# Patient Record
Sex: Male | Born: 1970 | Race: White | Hispanic: No | Smoking: Former smoker
Health system: Southern US, Community
[De-identification: ages and names within clinical notes are randomized; demographics above are authoritative.]

## PROBLEM LIST (undated history)

## (undated) DIAGNOSIS — F17200 Nicotine dependence, unspecified, uncomplicated: Secondary | ICD-10-CM

## (undated) DIAGNOSIS — Z79899 Other long term (current) drug therapy: Secondary | ICD-10-CM

## (undated) DIAGNOSIS — Z9842 Cataract extraction status, left eye: Secondary | ICD-10-CM

## (undated) DIAGNOSIS — G8929 Other chronic pain: Secondary | ICD-10-CM

## (undated) DIAGNOSIS — N189 Chronic kidney disease, unspecified: Secondary | ICD-10-CM

## (undated) DIAGNOSIS — M109 Gout, unspecified: Secondary | ICD-10-CM

## (undated) DIAGNOSIS — M1A9XX1 Chronic gout, unspecified, with tophus (tophi): Secondary | ICD-10-CM

## (undated) DIAGNOSIS — M1711 Unilateral primary osteoarthritis, right knee: Secondary | ICD-10-CM

## (undated) DIAGNOSIS — M779 Enthesopathy, unspecified: Secondary | ICD-10-CM

## (undated) DIAGNOSIS — R7301 Impaired fasting glucose: Secondary | ICD-10-CM

## (undated) DIAGNOSIS — D649 Anemia, unspecified: Secondary | ICD-10-CM

## (undated) DIAGNOSIS — E785 Hyperlipidemia, unspecified: Secondary | ICD-10-CM

## (undated) DIAGNOSIS — Q615 Medullary cystic kidney: Secondary | ICD-10-CM

## (undated) DIAGNOSIS — I10 Essential (primary) hypertension: Secondary | ICD-10-CM

## (undated) DIAGNOSIS — M1712 Unilateral primary osteoarthritis, left knee: Secondary | ICD-10-CM

## (undated) HISTORY — PX: EYE SURGERY: SHX253

## (undated) HISTORY — PX: CARDIAC CATHETERIZATION: SHX172

## (undated) HISTORY — DX: Unilateral primary osteoarthritis, left knee: M17.12

## (undated) HISTORY — DX: Gout, unspecified: M10.9

## (undated) HISTORY — DX: Other long term (current) drug therapy: Z79.899

## (undated) HISTORY — DX: Other chronic pain: G89.29

## (undated) HISTORY — DX: Enthesopathy, unspecified: M77.9

## (undated) HISTORY — DX: Impaired fasting glucose: R73.01

## (undated) HISTORY — DX: Essential (primary) hypertension: I10

## (undated) HISTORY — PX: HERNIA REPAIR: SHX51

## (undated) HISTORY — DX: Hyperlipidemia, unspecified: E78.5

## (undated) HISTORY — DX: Medullary cystic kidney: Q61.5

## (undated) HISTORY — PX: CARPAL TUNNEL RELEASE: SHX101

## (undated) HISTORY — DX: Cataract extraction status, left eye: Z98.42

## (undated) HISTORY — DX: Anemia, unspecified: D64.9

---

## 2004-05-17 ENCOUNTER — Other Ambulatory Visit: Payer: Self-pay

## 2005-01-12 ENCOUNTER — Ambulatory Visit: Payer: Self-pay

## 2006-11-13 HISTORY — PX: CATARACT EXTRACTION W/ INTRAOCULAR LENS IMPLANT: SHX1309

## 2007-02-06 ENCOUNTER — Ambulatory Visit: Payer: Self-pay | Admitting: Ophthalmology

## 2007-04-08 ENCOUNTER — Ambulatory Visit: Payer: Self-pay | Admitting: Ophthalmology

## 2008-03-04 ENCOUNTER — Ambulatory Visit: Payer: Self-pay | Admitting: Surgery

## 2008-03-04 ENCOUNTER — Other Ambulatory Visit: Payer: Self-pay

## 2008-03-05 ENCOUNTER — Ambulatory Visit: Payer: Self-pay | Admitting: Surgery

## 2008-11-13 HISTORY — PX: UMBILICAL HERNIA REPAIR: SHX196

## 2009-01-21 ENCOUNTER — Ambulatory Visit: Payer: Self-pay | Admitting: Pain Medicine

## 2012-01-20 DIAGNOSIS — E785 Hyperlipidemia, unspecified: Secondary | ICD-10-CM | POA: Diagnosis not present

## 2012-01-20 DIAGNOSIS — Q615 Medullary cystic kidney: Secondary | ICD-10-CM | POA: Diagnosis not present

## 2012-01-20 DIAGNOSIS — I10 Essential (primary) hypertension: Secondary | ICD-10-CM | POA: Diagnosis not present

## 2012-01-20 DIAGNOSIS — Z79899 Other long term (current) drug therapy: Secondary | ICD-10-CM | POA: Diagnosis not present

## 2012-01-20 DIAGNOSIS — G894 Chronic pain syndrome: Secondary | ICD-10-CM | POA: Diagnosis not present

## 2012-01-20 DIAGNOSIS — M1A9XX1 Chronic gout, unspecified, with tophus (tophi): Secondary | ICD-10-CM | POA: Diagnosis not present

## 2012-03-29 DIAGNOSIS — E781 Pure hyperglyceridemia: Secondary | ICD-10-CM | POA: Diagnosis not present

## 2012-03-29 DIAGNOSIS — G894 Chronic pain syndrome: Secondary | ICD-10-CM | POA: Diagnosis not present

## 2012-03-29 DIAGNOSIS — D631 Anemia in chronic kidney disease: Secondary | ICD-10-CM | POA: Diagnosis not present

## 2012-03-29 DIAGNOSIS — M1A9XX1 Chronic gout, unspecified, with tophus (tophi): Secondary | ICD-10-CM | POA: Diagnosis not present

## 2012-03-29 DIAGNOSIS — Q615 Medullary cystic kidney: Secondary | ICD-10-CM | POA: Diagnosis not present

## 2012-03-29 DIAGNOSIS — N039 Chronic nephritic syndrome with unspecified morphologic changes: Secondary | ICD-10-CM | POA: Diagnosis not present

## 2012-03-29 DIAGNOSIS — Z79899 Other long term (current) drug therapy: Secondary | ICD-10-CM | POA: Diagnosis not present

## 2012-03-29 DIAGNOSIS — E785 Hyperlipidemia, unspecified: Secondary | ICD-10-CM | POA: Diagnosis not present

## 2012-03-29 DIAGNOSIS — I10 Essential (primary) hypertension: Secondary | ICD-10-CM | POA: Diagnosis not present

## 2012-04-01 DIAGNOSIS — E781 Pure hyperglyceridemia: Secondary | ICD-10-CM | POA: Diagnosis not present

## 2012-04-01 DIAGNOSIS — M1A9XX1 Chronic gout, unspecified, with tophus (tophi): Secondary | ICD-10-CM | POA: Diagnosis not present

## 2012-04-01 DIAGNOSIS — D631 Anemia in chronic kidney disease: Secondary | ICD-10-CM | POA: Diagnosis not present

## 2012-04-01 DIAGNOSIS — Z79899 Other long term (current) drug therapy: Secondary | ICD-10-CM | POA: Diagnosis not present

## 2012-05-27 DIAGNOSIS — Z79899 Other long term (current) drug therapy: Secondary | ICD-10-CM | POA: Diagnosis not present

## 2012-05-27 DIAGNOSIS — D631 Anemia in chronic kidney disease: Secondary | ICD-10-CM | POA: Diagnosis not present

## 2012-05-27 DIAGNOSIS — E781 Pure hyperglyceridemia: Secondary | ICD-10-CM | POA: Diagnosis not present

## 2012-05-27 DIAGNOSIS — E785 Hyperlipidemia, unspecified: Secondary | ICD-10-CM | POA: Diagnosis not present

## 2012-05-27 DIAGNOSIS — G894 Chronic pain syndrome: Secondary | ICD-10-CM | POA: Diagnosis not present

## 2012-05-27 DIAGNOSIS — N039 Chronic nephritic syndrome with unspecified morphologic changes: Secondary | ICD-10-CM | POA: Diagnosis not present

## 2012-05-27 DIAGNOSIS — Q615 Medullary cystic kidney: Secondary | ICD-10-CM | POA: Diagnosis not present

## 2012-05-27 DIAGNOSIS — I10 Essential (primary) hypertension: Secondary | ICD-10-CM | POA: Diagnosis not present

## 2012-05-27 DIAGNOSIS — M1A9XX1 Chronic gout, unspecified, with tophus (tophi): Secondary | ICD-10-CM | POA: Diagnosis not present

## 2012-06-19 DIAGNOSIS — M1A9XX1 Chronic gout, unspecified, with tophus (tophi): Secondary | ICD-10-CM | POA: Diagnosis not present

## 2012-06-19 DIAGNOSIS — Z79899 Other long term (current) drug therapy: Secondary | ICD-10-CM | POA: Diagnosis not present

## 2012-06-19 DIAGNOSIS — I10 Essential (primary) hypertension: Secondary | ICD-10-CM | POA: Diagnosis not present

## 2012-06-19 DIAGNOSIS — E785 Hyperlipidemia, unspecified: Secondary | ICD-10-CM | POA: Diagnosis not present

## 2012-06-19 DIAGNOSIS — E781 Pure hyperglyceridemia: Secondary | ICD-10-CM | POA: Diagnosis not present

## 2012-07-19 DIAGNOSIS — I10 Essential (primary) hypertension: Secondary | ICD-10-CM | POA: Diagnosis not present

## 2012-07-19 DIAGNOSIS — N183 Chronic kidney disease, stage 3 unspecified: Secondary | ICD-10-CM | POA: Diagnosis not present

## 2012-07-19 DIAGNOSIS — Q615 Medullary cystic kidney: Secondary | ICD-10-CM | POA: Diagnosis not present

## 2012-08-02 DIAGNOSIS — H2510 Age-related nuclear cataract, unspecified eye: Secondary | ICD-10-CM | POA: Diagnosis not present

## 2012-08-02 DIAGNOSIS — Z961 Presence of intraocular lens: Secondary | ICD-10-CM | POA: Diagnosis not present

## 2012-08-30 DIAGNOSIS — Z79899 Other long term (current) drug therapy: Secondary | ICD-10-CM | POA: Diagnosis not present

## 2012-08-30 DIAGNOSIS — I10 Essential (primary) hypertension: Secondary | ICD-10-CM | POA: Diagnosis not present

## 2012-08-30 DIAGNOSIS — E785 Hyperlipidemia, unspecified: Secondary | ICD-10-CM | POA: Diagnosis not present

## 2012-08-30 DIAGNOSIS — D631 Anemia in chronic kidney disease: Secondary | ICD-10-CM | POA: Diagnosis not present

## 2012-08-30 DIAGNOSIS — Q615 Medullary cystic kidney: Secondary | ICD-10-CM | POA: Diagnosis not present

## 2012-08-30 DIAGNOSIS — E781 Pure hyperglyceridemia: Secondary | ICD-10-CM | POA: Diagnosis not present

## 2012-08-30 DIAGNOSIS — N039 Chronic nephritic syndrome with unspecified morphologic changes: Secondary | ICD-10-CM | POA: Diagnosis not present

## 2012-08-30 DIAGNOSIS — G894 Chronic pain syndrome: Secondary | ICD-10-CM | POA: Diagnosis not present

## 2012-08-30 DIAGNOSIS — M1A9XX1 Chronic gout, unspecified, with tophus (tophi): Secondary | ICD-10-CM | POA: Diagnosis not present

## 2012-10-25 DIAGNOSIS — E785 Hyperlipidemia, unspecified: Secondary | ICD-10-CM | POA: Diagnosis not present

## 2012-10-25 DIAGNOSIS — M1A9XX1 Chronic gout, unspecified, with tophus (tophi): Secondary | ICD-10-CM | POA: Diagnosis not present

## 2012-10-25 DIAGNOSIS — N039 Chronic nephritic syndrome with unspecified morphologic changes: Secondary | ICD-10-CM | POA: Diagnosis not present

## 2012-10-25 DIAGNOSIS — R7989 Other specified abnormal findings of blood chemistry: Secondary | ICD-10-CM | POA: Diagnosis not present

## 2012-10-25 DIAGNOSIS — Z79899 Other long term (current) drug therapy: Secondary | ICD-10-CM | POA: Diagnosis not present

## 2012-10-25 DIAGNOSIS — D631 Anemia in chronic kidney disease: Secondary | ICD-10-CM | POA: Diagnosis not present

## 2012-10-25 DIAGNOSIS — E781 Pure hyperglyceridemia: Secondary | ICD-10-CM | POA: Diagnosis not present

## 2012-11-01 DIAGNOSIS — G894 Chronic pain syndrome: Secondary | ICD-10-CM | POA: Diagnosis not present

## 2012-11-01 DIAGNOSIS — D631 Anemia in chronic kidney disease: Secondary | ICD-10-CM | POA: Diagnosis not present

## 2012-11-01 DIAGNOSIS — Q615 Medullary cystic kidney: Secondary | ICD-10-CM | POA: Diagnosis not present

## 2012-11-01 DIAGNOSIS — I10 Essential (primary) hypertension: Secondary | ICD-10-CM | POA: Diagnosis not present

## 2012-11-01 DIAGNOSIS — E785 Hyperlipidemia, unspecified: Secondary | ICD-10-CM | POA: Diagnosis not present

## 2012-11-01 DIAGNOSIS — E781 Pure hyperglyceridemia: Secondary | ICD-10-CM | POA: Diagnosis not present

## 2012-11-01 DIAGNOSIS — Z79899 Other long term (current) drug therapy: Secondary | ICD-10-CM | POA: Diagnosis not present

## 2012-11-01 DIAGNOSIS — N039 Chronic nephritic syndrome with unspecified morphologic changes: Secondary | ICD-10-CM | POA: Diagnosis not present

## 2012-11-01 DIAGNOSIS — M1A9XX1 Chronic gout, unspecified, with tophus (tophi): Secondary | ICD-10-CM | POA: Diagnosis not present

## 2012-12-13 DIAGNOSIS — M1710 Unilateral primary osteoarthritis, unspecified knee: Secondary | ICD-10-CM | POA: Diagnosis not present

## 2013-01-03 DIAGNOSIS — M1A9XX1 Chronic gout, unspecified, with tophus (tophi): Secondary | ICD-10-CM | POA: Diagnosis not present

## 2013-01-03 DIAGNOSIS — Z79899 Other long term (current) drug therapy: Secondary | ICD-10-CM | POA: Diagnosis not present

## 2013-01-03 DIAGNOSIS — I10 Essential (primary) hypertension: Secondary | ICD-10-CM | POA: Diagnosis not present

## 2013-01-03 DIAGNOSIS — Q615 Medullary cystic kidney: Secondary | ICD-10-CM | POA: Diagnosis not present

## 2013-01-03 DIAGNOSIS — G894 Chronic pain syndrome: Secondary | ICD-10-CM | POA: Diagnosis not present

## 2013-01-03 DIAGNOSIS — E781 Pure hyperglyceridemia: Secondary | ICD-10-CM | POA: Diagnosis not present

## 2013-01-03 DIAGNOSIS — N039 Chronic nephritic syndrome with unspecified morphologic changes: Secondary | ICD-10-CM | POA: Diagnosis not present

## 2013-01-03 DIAGNOSIS — D631 Anemia in chronic kidney disease: Secondary | ICD-10-CM | POA: Diagnosis not present

## 2013-01-03 DIAGNOSIS — E785 Hyperlipidemia, unspecified: Secondary | ICD-10-CM | POA: Diagnosis not present

## 2013-02-07 DIAGNOSIS — I10 Essential (primary) hypertension: Secondary | ICD-10-CM | POA: Diagnosis not present

## 2013-02-07 DIAGNOSIS — R7989 Other specified abnormal findings of blood chemistry: Secondary | ICD-10-CM | POA: Diagnosis not present

## 2013-02-07 DIAGNOSIS — E781 Pure hyperglyceridemia: Secondary | ICD-10-CM | POA: Diagnosis not present

## 2013-02-07 DIAGNOSIS — E785 Hyperlipidemia, unspecified: Secondary | ICD-10-CM | POA: Diagnosis not present

## 2013-02-07 DIAGNOSIS — G894 Chronic pain syndrome: Secondary | ICD-10-CM | POA: Diagnosis not present

## 2013-02-07 DIAGNOSIS — M1A9XX1 Chronic gout, unspecified, with tophus (tophi): Secondary | ICD-10-CM | POA: Diagnosis not present

## 2013-02-07 DIAGNOSIS — Z79899 Other long term (current) drug therapy: Secondary | ICD-10-CM | POA: Diagnosis not present

## 2013-02-14 DIAGNOSIS — I10 Essential (primary) hypertension: Secondary | ICD-10-CM | POA: Diagnosis not present

## 2013-02-14 DIAGNOSIS — N183 Chronic kidney disease, stage 3 unspecified: Secondary | ICD-10-CM | POA: Diagnosis not present

## 2013-02-14 DIAGNOSIS — M109 Gout, unspecified: Secondary | ICD-10-CM | POA: Diagnosis not present

## 2013-02-14 DIAGNOSIS — Q615 Medullary cystic kidney: Secondary | ICD-10-CM | POA: Diagnosis not present

## 2013-02-27 DIAGNOSIS — I10 Essential (primary) hypertension: Secondary | ICD-10-CM | POA: Diagnosis not present

## 2013-02-27 DIAGNOSIS — E785 Hyperlipidemia, unspecified: Secondary | ICD-10-CM | POA: Diagnosis not present

## 2013-02-27 DIAGNOSIS — E781 Pure hyperglyceridemia: Secondary | ICD-10-CM | POA: Diagnosis not present

## 2013-02-27 DIAGNOSIS — G894 Chronic pain syndrome: Secondary | ICD-10-CM | POA: Diagnosis not present

## 2013-02-27 DIAGNOSIS — M1A9XX1 Chronic gout, unspecified, with tophus (tophi): Secondary | ICD-10-CM | POA: Diagnosis not present

## 2013-02-27 DIAGNOSIS — D631 Anemia in chronic kidney disease: Secondary | ICD-10-CM | POA: Diagnosis not present

## 2013-02-27 DIAGNOSIS — Q615 Medullary cystic kidney: Secondary | ICD-10-CM | POA: Diagnosis not present

## 2013-02-27 DIAGNOSIS — Z79899 Other long term (current) drug therapy: Secondary | ICD-10-CM | POA: Diagnosis not present

## 2013-04-15 DIAGNOSIS — Z79899 Other long term (current) drug therapy: Secondary | ICD-10-CM | POA: Diagnosis not present

## 2013-04-15 DIAGNOSIS — I10 Essential (primary) hypertension: Secondary | ICD-10-CM | POA: Diagnosis not present

## 2013-04-15 DIAGNOSIS — M1A9XX1 Chronic gout, unspecified, with tophus (tophi): Secondary | ICD-10-CM | POA: Diagnosis not present

## 2013-07-18 DIAGNOSIS — Q615 Medullary cystic kidney: Secondary | ICD-10-CM | POA: Diagnosis not present

## 2013-07-18 DIAGNOSIS — G894 Chronic pain syndrome: Secondary | ICD-10-CM | POA: Diagnosis not present

## 2013-07-18 DIAGNOSIS — I10 Essential (primary) hypertension: Secondary | ICD-10-CM | POA: Diagnosis not present

## 2013-07-18 DIAGNOSIS — M25569 Pain in unspecified knee: Secondary | ICD-10-CM | POA: Diagnosis not present

## 2013-07-18 DIAGNOSIS — M1A00X1 Idiopathic chronic gout, unspecified site, with tophus (tophi): Secondary | ICD-10-CM | POA: Diagnosis not present

## 2013-07-18 DIAGNOSIS — Z131 Encounter for screening for diabetes mellitus: Secondary | ICD-10-CM | POA: Diagnosis not present

## 2013-07-24 DIAGNOSIS — G894 Chronic pain syndrome: Secondary | ICD-10-CM | POA: Diagnosis not present

## 2013-07-24 DIAGNOSIS — Z131 Encounter for screening for diabetes mellitus: Secondary | ICD-10-CM | POA: Diagnosis not present

## 2013-07-24 DIAGNOSIS — I10 Essential (primary) hypertension: Secondary | ICD-10-CM | POA: Diagnosis not present

## 2013-07-24 DIAGNOSIS — R7989 Other specified abnormal findings of blood chemistry: Secondary | ICD-10-CM | POA: Diagnosis not present

## 2013-08-15 DIAGNOSIS — I10 Essential (primary) hypertension: Secondary | ICD-10-CM | POA: Diagnosis not present

## 2013-08-15 DIAGNOSIS — N183 Chronic kidney disease, stage 3 unspecified: Secondary | ICD-10-CM | POA: Diagnosis not present

## 2013-08-15 DIAGNOSIS — Q615 Medullary cystic kidney: Secondary | ICD-10-CM | POA: Diagnosis not present

## 2013-08-15 DIAGNOSIS — M109 Gout, unspecified: Secondary | ICD-10-CM | POA: Diagnosis not present

## 2013-08-25 DIAGNOSIS — N183 Chronic kidney disease, stage 3 unspecified: Secondary | ICD-10-CM | POA: Diagnosis not present

## 2013-08-25 DIAGNOSIS — I10 Essential (primary) hypertension: Secondary | ICD-10-CM | POA: Diagnosis not present

## 2013-08-25 DIAGNOSIS — Q615 Medullary cystic kidney: Secondary | ICD-10-CM | POA: Diagnosis not present

## 2013-08-25 DIAGNOSIS — M109 Gout, unspecified: Secondary | ICD-10-CM | POA: Diagnosis not present

## 2013-08-28 DIAGNOSIS — M1710 Unilateral primary osteoarthritis, unspecified knee: Secondary | ICD-10-CM | POA: Diagnosis not present

## 2013-09-01 DIAGNOSIS — N183 Chronic kidney disease, stage 3 unspecified: Secondary | ICD-10-CM | POA: Diagnosis not present

## 2013-09-01 DIAGNOSIS — M25569 Pain in unspecified knee: Secondary | ICD-10-CM | POA: Diagnosis not present

## 2013-09-01 DIAGNOSIS — G894 Chronic pain syndrome: Secondary | ICD-10-CM | POA: Diagnosis not present

## 2013-09-01 DIAGNOSIS — Z79899 Other long term (current) drug therapy: Secondary | ICD-10-CM | POA: Diagnosis not present

## 2013-09-04 DIAGNOSIS — M1710 Unilateral primary osteoarthritis, unspecified knee: Secondary | ICD-10-CM | POA: Diagnosis not present

## 2013-09-11 DIAGNOSIS — M1710 Unilateral primary osteoarthritis, unspecified knee: Secondary | ICD-10-CM | POA: Diagnosis not present

## 2013-09-18 DIAGNOSIS — M1710 Unilateral primary osteoarthritis, unspecified knee: Secondary | ICD-10-CM | POA: Diagnosis not present

## 2013-09-25 DIAGNOSIS — M1710 Unilateral primary osteoarthritis, unspecified knee: Secondary | ICD-10-CM | POA: Diagnosis not present

## 2013-10-02 DIAGNOSIS — M109 Gout, unspecified: Secondary | ICD-10-CM | POA: Diagnosis not present

## 2013-10-02 DIAGNOSIS — I10 Essential (primary) hypertension: Secondary | ICD-10-CM | POA: Diagnosis not present

## 2013-10-02 DIAGNOSIS — R7301 Impaired fasting glucose: Secondary | ICD-10-CM | POA: Diagnosis not present

## 2013-10-30 DIAGNOSIS — G894 Chronic pain syndrome: Secondary | ICD-10-CM | POA: Diagnosis not present

## 2013-10-30 DIAGNOSIS — F17200 Nicotine dependence, unspecified, uncomplicated: Secondary | ICD-10-CM | POA: Diagnosis not present

## 2013-10-30 DIAGNOSIS — M25569 Pain in unspecified knee: Secondary | ICD-10-CM | POA: Diagnosis not present

## 2013-10-30 DIAGNOSIS — M109 Gout, unspecified: Secondary | ICD-10-CM | POA: Diagnosis not present

## 2013-12-26 DIAGNOSIS — F17200 Nicotine dependence, unspecified, uncomplicated: Secondary | ICD-10-CM | POA: Diagnosis not present

## 2013-12-26 DIAGNOSIS — M109 Gout, unspecified: Secondary | ICD-10-CM | POA: Diagnosis not present

## 2013-12-26 DIAGNOSIS — G894 Chronic pain syndrome: Secondary | ICD-10-CM | POA: Diagnosis not present

## 2013-12-26 DIAGNOSIS — M25569 Pain in unspecified knee: Secondary | ICD-10-CM | POA: Diagnosis not present

## 2014-01-16 ENCOUNTER — Ambulatory Visit: Payer: Self-pay | Admitting: Orthopedic Surgery

## 2014-01-16 DIAGNOSIS — M1710 Unilateral primary osteoarthritis, unspecified knee: Secondary | ICD-10-CM | POA: Diagnosis not present

## 2014-01-16 DIAGNOSIS — M76899 Other specified enthesopathies of unspecified lower limb, excluding foot: Secondary | ICD-10-CM | POA: Diagnosis not present

## 2014-01-16 DIAGNOSIS — IMO0002 Reserved for concepts with insufficient information to code with codable children: Secondary | ICD-10-CM | POA: Diagnosis not present

## 2014-01-28 DIAGNOSIS — M1710 Unilateral primary osteoarthritis, unspecified knee: Secondary | ICD-10-CM | POA: Diagnosis not present

## 2014-02-20 DIAGNOSIS — E785 Hyperlipidemia, unspecified: Secondary | ICD-10-CM | POA: Diagnosis not present

## 2014-02-20 DIAGNOSIS — I129 Hypertensive chronic kidney disease with stage 1 through stage 4 chronic kidney disease, or unspecified chronic kidney disease: Secondary | ICD-10-CM | POA: Diagnosis not present

## 2014-02-20 DIAGNOSIS — R7301 Impaired fasting glucose: Secondary | ICD-10-CM | POA: Diagnosis not present

## 2014-03-11 DIAGNOSIS — I10 Essential (primary) hypertension: Secondary | ICD-10-CM | POA: Diagnosis not present

## 2014-03-11 DIAGNOSIS — N183 Chronic kidney disease, stage 3 unspecified: Secondary | ICD-10-CM | POA: Diagnosis not present

## 2014-03-11 DIAGNOSIS — M109 Gout, unspecified: Secondary | ICD-10-CM | POA: Diagnosis not present

## 2014-03-13 HISTORY — PX: JOINT REPLACEMENT: SHX530

## 2014-03-18 ENCOUNTER — Other Ambulatory Visit: Payer: Self-pay | Admitting: Physician Assistant

## 2014-03-18 ENCOUNTER — Encounter: Payer: Self-pay | Admitting: Physician Assistant

## 2014-03-18 DIAGNOSIS — M1712 Unilateral primary osteoarthritis, left knee: Secondary | ICD-10-CM | POA: Insufficient documentation

## 2014-03-18 DIAGNOSIS — G8929 Other chronic pain: Secondary | ICD-10-CM | POA: Insufficient documentation

## 2014-03-18 DIAGNOSIS — I10 Essential (primary) hypertension: Secondary | ICD-10-CM | POA: Insufficient documentation

## 2014-03-18 DIAGNOSIS — M1710 Unilateral primary osteoarthritis, unspecified knee: Secondary | ICD-10-CM | POA: Diagnosis not present

## 2014-03-18 DIAGNOSIS — Q615 Medullary cystic kidney: Secondary | ICD-10-CM

## 2014-03-18 NOTE — H&P (Signed)
TOTAL KNEE ADMISSION H&P  Patient is being admitted for left total knee arthroplasty.  Subjective:  Chief Complaint:left knee pain.  HPI: Nathan Ramos, 43 y.o. male, has a history of pain and functional disability in the left knee due to arthritis and has failed non-surgical conservative treatments for greater than 12 weeks to includeNSAID's and/or analgesics, corticosteriod injections, viscosupplementation injections, flexibility and strengthening excercises, supervised PT with diminished ADL's post treatment, weight reduction as appropriate and activity modification.  Onset of symptoms was gradual, starting 10 years ago with gradually worsening course since that time. The patient noted no past surgery on the left knee(s).  Patient currently rates pain in the left knee(s) at 10 out of 10 with activity. Patient has night pain, worsening of pain with activity and weight bearing, pain that interferes with activities of daily living, crepitus and joint swelling.  Patient has evidence of subchondral sclerosis, periarticular osteophytes and joint space narrowing by imaging studies. There is no active infection.  Patient Active Problem List   Diagnosis Date Noted  . Medullary cystic disease of the kidney   . Left knee DJD   . Chronic pain   . Hypertension    Past Medical History  Diagnosis Date  . 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 Left knee DJD   . Gout   . Chronic pain     Destry Ladona Ridgelaylor [facility]  . Hypertension     Past Surgical History  Procedure Laterality Date  . Carpal tunnel release Left   . Umbilical hernia repair  2010  . Cataract extraction w/ intraocular lens implant Left 2008  . Cataract extraction w/ intraocular lens implant Right 2008     (Not in a hospital admission) Allergies  Allergen Reactions  . Nsaids Other (See Comments)    impaired renal function    History  Substance Use Topics  . Smoking  status: Current Every Day Smoker -- 0.50 packs/day    Types: Cigarettes  . Smokeless tobacco: Not on file  . Alcohol Use: No    Family History  Problem Relation Age of Onset  . COPD Mother   . Stroke Father [id number]  . Kidney disease Mother      Review of Systems  Constitutional: Negative.   HENT: Negative.   Eyes: Negative.   Respiratory: Negative.   Cardiovascular: Negative.   Gastrointestinal: Negative.   Genitourinary: Negative.   Musculoskeletal: Positive for joint pain. Negative for back pain, falls, myalgias and neck pain.  Skin: Negative.   Endo/Heme/Allergies: Negative.   Psychiatric/Behavioral: Negative.     Objective:  Physical Exam  Constitutional: He is oriented to person, place, and time. He appears well-developed and well-nourished.  HENT:  Head: Normocephalic and atraumatic.  Mouth/Throat: Oropharynx is clear and moist.  Eyes: Conjunctivae are normal. Pupils are equal, round, and reactive to light.  Neck: Neck supple.  Cardiovascular: Normal rate and regular rhythm.   Respiratory: Effort normal and breath sounds normal.  GI: Soft. Bowel sounds are normal.  Genitourinary:  Not pertinent to current symptomatology therefore not examined.  Musculoskeletal:  . Examination of both knees reveals left knee is significantly more painful than right, 1+ synovitis 1+ crepitation, left knee shows range of motion -5 to 115 degrees with pain, range of motion has range of motion 0-125 degrees with pain. Vascular exam: pulses 2+ and symmetric.  Neurological: He is alert and oriented to person, place, and time.  Skin: Skin is warm and dry.  Psychiatric: He has a normal mood and affect. His behavior is normal.    Vital signs in last 24 hours: @VSRANGES @  Labs:   There is no height or weight on file to calculate BMI.   Imaging Review Plain radiographs demonstrate severe degenerative joint disease of the left knee(s). The overall alignment issignificant varus. The bone  quality appears to be good for age and reported activity level.  Assessment/Plan:  End stage arthritis, right knee   The patient history, physical examination, clinical judgment of the provider and imaging studies are consistent with end stage degenerative joint disease of the right knee(s) and total knee arthroplasty is deemed medically necessary. The treatment options including medical management, injection therapy arthroscopy and arthroplasty were discussed at length. The risks and benefits of total knee arthroplasty were presented and reviewed. The risks due to aseptic loosening, infection, stiffness, patella tracking problems, thromboembolic complications and other imponderables were discussed. The patient acknowledged the explanation, agreed to proceed with the plan and consent was signed. Patient is being admitted for inpatient treatment for surgery, pain control, PT, OT, prophylactic antibiotics, VTE prophylaxis, progressive ambulation and ADL's and discharge planning. The patient is planning to be discharged home with home health services Jamile Rekowski A. Gwinda PasseShepperson, PA-C Physician Assistant [facility] [phone]  03/18/2014, 3:36 PM

## 2014-03-20 DIAGNOSIS — M259 Joint disorder, unspecified: Secondary | ICD-10-CM | POA: Diagnosis not present

## 2014-03-20 DIAGNOSIS — M1710 Unilateral primary osteoarthritis, unspecified knee: Secondary | ICD-10-CM | POA: Diagnosis not present

## 2014-03-20 DIAGNOSIS — Z01818 Encounter for other preprocedural examination: Secondary | ICD-10-CM | POA: Diagnosis not present

## 2014-03-20 NOTE — Pre-Procedure Instructions (Signed)
Nathan GroomsStacey L Ramos  03/20/2014   Your procedure is scheduled on:  Monday May 18 th at 0920 AM  [facility] at 0720 AM.  Call this number if you have problems the morning of surgery: [phone]   Remember:   Do not eat food or drink liquids after midnight Sunday 03/29/14.   Take these medicines the morning of surgery with A SIP OF WATER: Amlodipine (Norvasc), and pain med if needed for pain   Do not wear jewelry.  Do not wear lotions, powders, or colognes. You may wear deodorant.  Men may shave face and neck.  Do not bring valuables to the hospital.  [facility] is not responsible for any belongings or valuables.               Contacts, dentures or bridgework may not be worn into surgery.  Leave suitcase in the car. After surgery it may be brought to your room.  For patients admitted to the hospital, discharge time is determined by your  treatment team.               Patients discharged the day of surgery will not be allowed to drive home.    Special Instructions: [place] - Preparing for Surgery  Before surgery, you can play an important role.  Because skin is not sterile, your skin needs to be as free of germs as possible.  You can reduce the number of germs on you skin by washing with CHG (chlorahexidine gluconate) soap before surgery.  CHG is an antiseptic cleaner which kills germs and bonds with the skin to continue killing germs even after washing.  Please DO NOT use if you have an allergy to CHG or antibacterial soaps.  If your skin becomes reddened/irritated stop using the CHG and inform your nurse when you arrive at Short Stay.  Do not shave (including legs and underarms) for at least 48 hours prior to the first CHG shower.  You may shave your face.  Please follow these instructions carefully:   1.  Shower with CHG Soap the night before surgery and the                                morning of Surgery.  2.  If you choose to wash your hair, wash your  hair first as usual with your       normal shampoo.  3.  After you shampoo, rinse your hair and body thoroughly to remove the                      Shampoo.  4.  Use CHG as you would any other liquid soap.  You can apply chg directly       to the skin and wash gently with scrungie or a clean washcloth.  5.  Apply the CHG Soap to your body ONLY FROM THE NECK DOWN.        Do not use on open wounds or open sores.  Avoid contact with your eyes,       ears, mouth and genitals (private parts).  Wash genitals (private parts)       with your normal soap.  6.  Wash thoroughly, paying special attention to the area where your surgery        will be performed.  7.  Thoroughly rinse your body with warm water from  the neck down.  8.  DO NOT shower/wash with your normal soap after using and rinsing off       the CHG Soap.  9.  Pat yourself dry with a clean towel.            10.  Wear clean pajamas.            11.  Place clean sheets on your bed the night of your first shower and do not        sleep with pets.  Day of Surgery  Do not apply any lotions/deoderants the morning of surgery.  Please wear clean clothes to the hospital/surgery center.      Please read over the following fact sheets that you were given: Pain Booklet, Coughing and Deep Breathing, Blood Transfusion Information, MRSA Information and Surgical Site Infection Prevention

## 2014-03-23 ENCOUNTER — Encounter (HOSPITAL_COMMUNITY)
Admission: RE | Admit: 2014-03-23 | Discharge: 2014-03-23 | Disposition: A | Discharge status: Home / Self Care | Payer: Medicare Other | Source: Ambulatory Visit | Attending: Orthopedic Surgery | Admitting: Orthopedic Surgery

## 2014-03-23 ENCOUNTER — Encounter (HOSPITAL_COMMUNITY): Payer: Self-pay

## 2014-03-23 ENCOUNTER — Other Ambulatory Visit: Payer: Self-pay | Admitting: Physician Assistant

## 2014-03-23 ENCOUNTER — Encounter (HOSPITAL_COMMUNITY)
Admission: RE | Admit: 2014-03-23 | Discharge: 2014-03-23 | Disposition: A | Discharge status: Home / Self Care | Payer: Medicare Other | Source: Ambulatory Visit | Attending: Physician Assistant | Admitting: Physician Assistant

## 2014-03-23 DIAGNOSIS — I10 Essential (primary) hypertension: Secondary | ICD-10-CM | POA: Insufficient documentation

## 2014-03-23 DIAGNOSIS — Z01812 Encounter for preprocedural laboratory examination: Secondary | ICD-10-CM | POA: Insufficient documentation

## 2014-03-23 HISTORY — DX: Chronic kidney disease, unspecified: N18.9

## 2014-03-23 HISTORY — DX: Nicotine dependence, unspecified, uncomplicated: F17.200

## 2014-03-23 LAB — URINALYSIS, ROUTINE W REFLEX MICROSCOPIC
Bilirubin Urine: NEGATIVE
Glucose, UA: NEGATIVE mg/dL
Ketones, ur: NEGATIVE mg/dL
Leukocytes, UA: NEGATIVE
Nitrite: NEGATIVE
Protein, ur: NEGATIVE mg/dL
Specific Gravity, Urine: 1.012 (ref 1.005–1.030)
Urobilinogen, UA: 0.2 mg/dL (ref 0.0–1.0)
pH: 5.5 (ref 5.0–8.0)

## 2014-03-23 LAB — URINE MICROSCOPIC-ADD ON

## 2014-03-23 LAB — APTT: aPTT: 35 seconds (ref 24–37)

## 2014-03-23 LAB — COMPREHENSIVE METABOLIC PANEL
ALT: 24 U/L (ref 0–53)
AST: 29 U/L (ref 0–37)
Albumin: 4.4 g/dL (ref 3.5–5.2)
Alkaline Phosphatase: 87 U/L (ref 39–117)
BUN: 41 mg/dL — ABNORMAL HIGH (ref 6–23)
CO2: 24 mEq/L (ref 19–32)
Calcium: 9.9 mg/dL (ref 8.4–10.5)
Chloride: 102 mEq/L (ref 96–112)
Creatinine, Ser: 2.05 mg/dL — ABNORMAL HIGH (ref 0.50–1.35)
GFR calc Af Amer: 45 mL/min — ABNORMAL LOW (ref >90)
GFR calc non Af Amer: 39 mL/min — ABNORMAL LOW (ref >90)
Glucose, Bld: 90 mg/dL (ref 70–99)
Potassium: 4.6 mEq/L (ref 3.7–5.3)
Sodium: 140 mEq/L (ref 137–147)
Total Bilirubin: 0.4 mg/dL (ref 0.3–1.2)
Total Protein: 8.4 g/dL — ABNORMAL HIGH (ref 6.0–8.3)

## 2014-03-23 LAB — TYPE AND SCREEN
ABO/RH(D): O POS
Antibody Screen: NEGATIVE

## 2014-03-23 LAB — CBC WITH DIFFERENTIAL/PLATELET
Basophils Absolute: 0 10*3/uL (ref 0.0–0.1)
Basophils Relative: 1 % (ref 0–1)
Eosinophils Absolute: 0.3 10*3/uL (ref 0.0–0.7)
Eosinophils Relative: 4 % (ref 0–5)
HCT: 36.1 % — ABNORMAL LOW (ref 39.0–52.0)
Hemoglobin: 12 g/dL — ABNORMAL LOW (ref 13.0–17.0)
Lymphocytes Relative: 33 % (ref 12–46)
Lymphs Abs: 2.3 10*3/uL (ref 0.7–4.0)
MCH: 28.9 pg (ref 26.0–34.0)
MCHC: 33.2 g/dL (ref 30.0–36.0)
MCV: 87 fL (ref 78.0–100.0)
Monocytes Absolute: 0.4 10*3/uL (ref 0.1–1.0)
Monocytes Relative: 6 % (ref 3–12)
Neutro Abs: 4 10*3/uL (ref 1.7–7.7)
Neutrophils Relative %: 56 % (ref 43–77)
Platelets: 255 10*3/uL (ref 150–400)
RBC: 4.15 MIL/uL — ABNORMAL LOW (ref 4.22–5.81)
RDW: 12.6 % (ref 11.5–15.5)
WBC: 7.1 10*3/uL (ref 4.0–10.5)

## 2014-03-23 LAB — SURGICAL PCR SCREEN
MRSA, PCR: NEGATIVE
Staphylococcus aureus: POSITIVE — AB

## 2014-03-23 LAB — PROTIME-INR
INR: 0.92 (ref 0.00–1.49)
Prothrombin Time: 12.2 seconds (ref 11.6–15.2)

## 2014-03-23 LAB — ABO/RH: ABO/RH(D): O POS

## 2014-03-23 MED ORDER — CHLORHEXIDINE GLUCONATE 4 % EX LIQD: 60.0000 mL | Freq: Once | CUTANEOUS | Status: DC

## 2014-03-23 NOTE — Progress Notes (Signed)
Patient made aware that nasal swab was positive for staph and that a prescription was called to [facility] at [phone] .Patient stated understanding to start nasal ointment.

## 2014-03-23 NOTE — Progress Notes (Signed)
Dr Thurston HoleWainer office made aware that orders were needed in Epic for patient's PAT appointment

## 2014-03-24 ENCOUNTER — Encounter (HOSPITAL_COMMUNITY): Payer: Self-pay

## 2014-03-24 LAB — URINE CULTURE: Colony Count: 3000

## 2014-03-24 NOTE — Progress Notes (Addendum)
Anesthesia chart review: Patient is a 43 year old male scheduled for left TKA on 03/30/2014 by Dr. Salvatore Marvelobert [facility].  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 Mosetta PigeonHarmeet Singh), UHR '10, cataract extraction. BMI is 34.98 consistent with obesity. PCP is Dr. Baruch GoutyMelinda Lada with [facility] who is aware of plans for surgery. Nephrologist Dr. Thedore MinsSingh is also aware of plans for surgery.    EKG on 03/20/14 ([facility]) showed NSR.   Chest x-ray on 03/23/14 showed no acute cardiopulmonary abnormality seen.  Preoperative labs noted.  BUN/Cr 41/2.05, H/H 12.0/36.1.  Urine culture is still pending. I received 03/11/14 nephrology records which indicate that his BUN/Cr have been primarily 30-40's/2-2.3 since 2011, so overall his renal function appears stable.      Dr. Sherene SiresWainer's office to fax formal clearance note as available.  Follow-up urine culture once available.  Velna Ochsllison Jayquan Bradsher, PA-C [facility]Anesthesiology Phone [phone] 03/24/2014 1:14 PM  Addendum: 03/26/2014 4:04 PM Urine culture showed insignificant growth. I received a clearance note from Dr. Sherie DonLada also stating that nephrology felt patient's renal function was stable and that no pre-operative cardiac testing was felt indicated. She did recommend watching his blood pressure closely as it had gone up in the past with surgery.

## 2014-03-29 MED ORDER — CHLORHEXIDINE GLUCONATE 4 % EX LIQD
60.0000 mL | Freq: Once | CUTANEOUS | Status: DC
  Filled 2014-03-29: qty 60

## 2014-03-29 MED ORDER — CEFAZOLIN SODIUM-DEXTROSE 2-3 GM-% IV SOLR
2.0000 g | INTRAVENOUS | Status: AC
  Administered 2014-03-30: 2 g via INTRAVENOUS

## 2014-03-29 MED ORDER — POVIDONE-IODINE 7.5 % EX SOLN
Freq: Once | CUTANEOUS | Status: DC
  Filled 2014-03-29: qty 118

## 2014-03-30 ENCOUNTER — Encounter (HOSPITAL_COMMUNITY): Payer: Self-pay | Admitting: Anesthesiology

## 2014-03-30 ENCOUNTER — Inpatient Hospital Stay (HOSPITAL_COMMUNITY)
Admission: RE | Admit: 2014-03-30 | Discharge: 2014-03-31 | DRG: 470 | Disposition: A | Discharge status: Home Health | Payer: Medicare Other | Source: Ambulatory Visit | Attending: Orthopedic Surgery | Admitting: Orthopedic Surgery

## 2014-03-30 ENCOUNTER — Encounter (HOSPITAL_COMMUNITY): Payer: Medicare Other | Admitting: Vascular Surgery

## 2014-03-30 ENCOUNTER — Encounter (HOSPITAL_COMMUNITY)
Admission: RE | Disposition: A | Discharge status: Home Health | Payer: Self-pay | Source: Ambulatory Visit | Attending: Orthopedic Surgery

## 2014-03-30 ENCOUNTER — Inpatient Hospital Stay (HOSPITAL_COMMUNITY): Payer: Medicare Other | Admitting: Anesthesiology

## 2014-03-30 DIAGNOSIS — G8929 Other chronic pain: Secondary | ICD-10-CM | POA: Diagnosis present

## 2014-03-30 DIAGNOSIS — M1030 Gout due to renal impairment, unspecified site: Secondary | ICD-10-CM | POA: Diagnosis present

## 2014-03-30 DIAGNOSIS — E669 Obesity, unspecified: Secondary | ICD-10-CM | POA: Diagnosis present

## 2014-03-30 DIAGNOSIS — M1710 Unilateral primary osteoarthritis, unspecified knee: Secondary | ICD-10-CM | POA: Diagnosis not present

## 2014-03-30 DIAGNOSIS — N183 Chronic kidney disease, stage 3 unspecified: Secondary | ICD-10-CM | POA: Diagnosis not present

## 2014-03-30 DIAGNOSIS — M1712 Unilateral primary osteoarthritis, left knee: Secondary | ICD-10-CM | POA: Diagnosis present

## 2014-03-30 DIAGNOSIS — I129 Hypertensive chronic kidney disease with stage 1 through stage 4 chronic kidney disease, or unspecified chronic kidney disease: Secondary | ICD-10-CM | POA: Diagnosis present

## 2014-03-30 DIAGNOSIS — Z9849 Cataract extraction status, unspecified eye: Secondary | ICD-10-CM

## 2014-03-30 DIAGNOSIS — I10 Essential (primary) hypertension: Secondary | ICD-10-CM | POA: Diagnosis present

## 2014-03-30 DIAGNOSIS — IMO0002 Reserved for concepts with insufficient information to code with codable children: Secondary | ICD-10-CM | POA: Diagnosis not present

## 2014-03-30 DIAGNOSIS — F17200 Nicotine dependence, unspecified, uncomplicated: Secondary | ICD-10-CM | POA: Diagnosis present

## 2014-03-30 DIAGNOSIS — Q615 Medullary cystic kidney: Secondary | ICD-10-CM | POA: Diagnosis not present

## 2014-03-30 DIAGNOSIS — M109 Gout, unspecified: Secondary | ICD-10-CM | POA: Diagnosis present

## 2014-03-30 DIAGNOSIS — G8918 Other acute postprocedural pain: Secondary | ICD-10-CM | POA: Diagnosis not present

## 2014-03-30 DIAGNOSIS — Z6834 Body mass index (BMI) 34.0-34.9, adult: Secondary | ICD-10-CM

## 2014-03-30 DIAGNOSIS — M25569 Pain in unspecified knee: Secondary | ICD-10-CM | POA: Diagnosis not present

## 2014-03-30 DIAGNOSIS — M179 Osteoarthritis of knee, unspecified: Secondary | ICD-10-CM | POA: Diagnosis present

## 2014-03-30 HISTORY — PX: TOTAL KNEE ARTHROPLASTY: SHX125

## 2014-03-30 LAB — CBC
HCT: 32.6 % — ABNORMAL LOW (ref 39.0–52.0)
Hemoglobin: 10.8 g/dL — ABNORMAL LOW (ref 13.0–17.0)
MCH: 29.2 pg (ref 26.0–34.0)
MCHC: 33.1 g/dL (ref 30.0–36.0)
MCV: 88.1 fL (ref 78.0–100.0)
Platelets: 219 10*3/uL (ref 150–400)
RBC: 3.7 MIL/uL — ABNORMAL LOW (ref 4.22–5.81)
RDW: 12.5 % (ref 11.5–15.5)
WBC: 12.2 10*3/uL — ABNORMAL HIGH (ref 4.0–10.5)

## 2014-03-30 LAB — CREATININE, SERUM
Creatinine, Ser: 2.36 mg/dL — ABNORMAL HIGH (ref 0.50–1.35)
GFR calc Af Amer: 38 mL/min — ABNORMAL LOW (ref >90)
GFR calc non Af Amer: 33 mL/min — ABNORMAL LOW (ref >90)

## 2014-03-30 SURGERY — ARTHROPLASTY, KNEE, TOTAL
Anesthesia: General | Site: Knee | Laterality: Left

## 2014-03-30 MED ORDER — FUROSEMIDE 40 MG PO TABS: 40.0000 mg | ORAL_TABLET | Freq: Every day | ORAL | Status: DC

## 2014-03-30 MED ORDER — EPHEDRINE SULFATE 50 MG/ML IJ SOLN
INTRAMUSCULAR | Status: AC
  Filled 2014-03-30: qty 1

## 2014-03-30 MED ORDER — DEXAMETHASONE SODIUM PHOSPHATE 10 MG/ML IJ SOLN
10.0000 mg | Freq: Three times a day (TID) | INTRAMUSCULAR | Status: DC
  Filled 2014-03-30 (×3): qty 1

## 2014-03-30 MED ORDER — HYDROMORPHONE HCL PF 1 MG/ML IJ SOLN
1.0000 mg | INTRAMUSCULAR | Status: DC | PRN
  Administered 2014-03-30 – 2014-03-31 (×4): 1 mg via INTRAVENOUS
  Filled 2014-03-30: qty 1
  Filled 2014-03-30: qty 2
  Filled 2014-03-30 (×2): qty 1

## 2014-03-30 MED ORDER — METOCLOPRAMIDE HCL 5 MG/ML IJ SOLN: 5.0000 mg | Freq: Three times a day (TID) | INTRAMUSCULAR | Status: DC | PRN

## 2014-03-30 MED ORDER — LOSARTAN POTASSIUM 50 MG PO TABS
50.0000 mg | ORAL_TABLET | Freq: Every day | ORAL | Status: DC
Start: 2014-04-01 — End: 2014-03-31

## 2014-03-30 MED ORDER — ONDANSETRON HCL 4 MG PO TABS: 4.0000 mg | ORAL_TABLET | Freq: Four times a day (QID) | ORAL | Status: DC | PRN

## 2014-03-30 MED ORDER — LIDOCAINE HCL (CARDIAC) 20 MG/ML IV SOLN
INTRAVENOUS | Status: AC
  Filled 2014-03-30: qty 5

## 2014-03-30 MED ORDER — LACTATED RINGERS IV SOLN
INTRAVENOUS | Status: DC | PRN
  Administered 2014-03-30 (×2): via INTRAVENOUS

## 2014-03-30 MED ORDER — FEBUXOSTAT 80 MG PO TABS: 1.0000 | ORAL_TABLET | Freq: Every day | ORAL | Status: DC

## 2014-03-30 MED ORDER — ACETAMINOPHEN 10 MG/ML IV SOLN
INTRAVENOUS | Status: AC
  Filled 2014-03-30: qty 100

## 2014-03-30 MED ORDER — FEBUXOSTAT 40 MG PO TABS
80.0000 mg | ORAL_TABLET | Freq: Every day | ORAL | Status: DC
Start: 2014-03-30 — End: 2014-03-31
  Administered 2014-03-30 – 2014-03-31 (×2): 80 mg via ORAL
  Filled 2014-03-30 (×4): qty 2

## 2014-03-30 MED ORDER — FENTANYL CITRATE 0.05 MG/ML IJ SOLN
INTRAMUSCULAR | Status: DC | PRN
  Administered 2014-03-30 (×4): 50 ug via INTRAVENOUS
  Administered 2014-03-30 (×2): 25 ug via INTRAVENOUS
  Administered 2014-03-30: 50 ug via INTRAVENOUS
  Administered 2014-03-30 (×2): 25 ug via INTRAVENOUS
  Administered 2014-03-30 (×2): 50 ug via INTRAVENOUS
  Administered 2014-03-30: 200 ug via INTRAVENOUS
  Administered 2014-03-30: 100 ug via INTRAVENOUS

## 2014-03-30 MED ORDER — BUPIVACAINE-EPINEPHRINE 0.25% -1:200000 IJ SOLN
INTRAMUSCULAR | Status: DC | PRN
  Administered 2014-03-30: 30 mL

## 2014-03-30 MED ORDER — DIPHENHYDRAMINE HCL 12.5 MG/5ML PO ELIX
12.5000 mg | ORAL_SOLUTION | ORAL | Status: DC | PRN
  Administered 2014-03-30: 25 mg via ORAL
  Filled 2014-03-30: qty 10

## 2014-03-30 MED ORDER — ACETAMINOPHEN 325 MG PO TABS: 650.0000 mg | ORAL_TABLET | Freq: Four times a day (QID) | ORAL | Status: DC | PRN

## 2014-03-30 MED ORDER — HYDROMORPHONE HCL PF 1 MG/ML IJ SOLN
INTRAMUSCULAR | Status: AC
  Filled 2014-03-30: qty 1

## 2014-03-30 MED ORDER — FENTANYL CITRATE 0.05 MG/ML IJ SOLN
INTRAMUSCULAR | Status: AC
  Filled 2014-03-30: qty 5

## 2014-03-30 MED ORDER — HYDROMORPHONE HCL PF 1 MG/ML IJ SOLN
INTRAMUSCULAR | Status: DC | PRN
  Administered 2014-03-30 (×2): 0.5 mg via INTRAVENOUS

## 2014-03-30 MED ORDER — METOCLOPRAMIDE HCL 5 MG/ML IJ SOLN: 10.0000 mg | Freq: Once | INTRAMUSCULAR | Status: DC | PRN

## 2014-03-30 MED ORDER — HYDROMORPHONE HCL PF 1 MG/ML IJ SOLN
0.2500 mg | INTRAMUSCULAR | Status: DC | PRN
  Administered 2014-03-30 (×4): 0.5 mg via INTRAVENOUS

## 2014-03-30 MED ORDER — MENTHOL 3 MG MT LOZG: 1.0000 | LOZENGE | OROMUCOSAL | Status: DC | PRN

## 2014-03-30 MED ORDER — BISACODYL 5 MG PO TBEC
10.0000 mg | DELAYED_RELEASE_TABLET | Freq: Every day | ORAL | Status: DC
  Administered 2014-03-30: 10 mg via ORAL
  Filled 2014-03-30: qty 2

## 2014-03-30 MED ORDER — AMLODIPINE BESYLATE 10 MG PO TABS
10.0000 mg | ORAL_TABLET | Freq: Every day | ORAL | Status: DC
  Administered 2014-03-31: 10 mg via ORAL
  Filled 2014-03-30: qty 1

## 2014-03-30 MED ORDER — LIDOCAINE HCL (CARDIAC) 20 MG/ML IV SOLN
INTRAVENOUS | Status: DC | PRN
  Administered 2014-03-30: 80 mg via INTRAVENOUS

## 2014-03-30 MED ORDER — POTASSIUM CHLORIDE IN NACL 20-0.9 MEQ/L-% IV SOLN
INTRAVENOUS | Status: DC
  Filled 2014-03-30 (×3): qty 1000

## 2014-03-30 MED ORDER — METOCLOPRAMIDE HCL 10 MG PO TABS
5.0000 mg | ORAL_TABLET | Freq: Three times a day (TID) | ORAL | Status: DC | PRN
Start: 2014-03-30 — End: 2014-03-31

## 2014-03-30 MED ORDER — DEXAMETHASONE SODIUM PHOSPHATE 10 MG/ML IJ SOLN
INTRAMUSCULAR | Status: DC | PRN
  Administered 2014-03-30: 10 mg via INTRAVENOUS

## 2014-03-30 MED ORDER — EPHEDRINE SULFATE 50 MG/ML IJ SOLN
INTRAMUSCULAR | Status: DC | PRN
  Administered 2014-03-30: 5 mg via INTRAVENOUS

## 2014-03-30 MED ORDER — BUPIVACAINE-EPINEPHRINE (PF) 0.25% -1:200000 IJ SOLN
INTRAMUSCULAR | Status: AC
  Filled 2014-03-30: qty 30

## 2014-03-30 MED ORDER — MEPERIDINE HCL 25 MG/ML IJ SOLN: 6.2500 mg | INTRAMUSCULAR | Status: DC | PRN

## 2014-03-30 MED ORDER — MIDAZOLAM HCL 2 MG/2ML IJ SOLN
1.0000 mg | INTRAMUSCULAR | Status: DC | PRN
  Administered 2014-03-30: 2 mg via INTRAVENOUS

## 2014-03-30 MED ORDER — SODIUM CHLORIDE 0.9 % IR SOLN
Status: DC | PRN
  Administered 2014-03-30: 1000 mL

## 2014-03-30 MED ORDER — ACETAMINOPHEN 650 MG RE SUPP: 650.0000 mg | Freq: Four times a day (QID) | RECTAL | Status: DC | PRN

## 2014-03-30 MED ORDER — DEXAMETHASONE 4 MG PO TABS
10.0000 mg | ORAL_TABLET | Freq: Three times a day (TID) | ORAL | Status: DC
  Administered 2014-03-30 (×2): 10 mg via ORAL
  Filled 2014-03-30 (×4): qty 1

## 2014-03-30 MED ORDER — FENTANYL CITRATE 0.05 MG/ML IJ SOLN
50.0000 ug | INTRAMUSCULAR | Status: DC | PRN
  Administered 2014-03-30: 100 ug via INTRAVENOUS

## 2014-03-30 MED ORDER — ROCURONIUM BROMIDE 50 MG/5ML IV SOLN
INTRAVENOUS | Status: AC
  Filled 2014-03-30: qty 1

## 2014-03-30 MED ORDER — MORPHINE SULFATE ER 15 MG PO TBCR
60.0000 mg | EXTENDED_RELEASE_TABLET | Freq: Two times a day (BID) | ORAL | Status: DC
  Administered 2014-03-30 – 2014-03-31 (×2): 60 mg via ORAL
  Filled 2014-03-30 (×2): qty 4

## 2014-03-30 MED ORDER — ONDANSETRON HCL 4 MG/2ML IJ SOLN: 4.0000 mg | Freq: Four times a day (QID) | INTRAMUSCULAR | Status: DC | PRN

## 2014-03-30 MED ORDER — PHENOL 1.4 % MT LIQD: 1.0000 | OROMUCOSAL | Status: DC | PRN

## 2014-03-30 MED ORDER — PROPOFOL 10 MG/ML IV BOLUS
INTRAVENOUS | Status: AC
  Filled 2014-03-30: qty 20

## 2014-03-30 MED ORDER — DOCUSATE SODIUM 100 MG PO CAPS
100.0000 mg | ORAL_CAPSULE | Freq: Two times a day (BID) | ORAL | Status: DC
  Administered 2014-03-30 – 2014-03-31 (×2): 100 mg via ORAL
  Filled 2014-03-30 (×2): qty 1

## 2014-03-30 MED ORDER — MIDAZOLAM HCL 2 MG/2ML IJ SOLN
INTRAMUSCULAR | Status: AC
  Administered 2014-03-30: 2 mg via INTRAVENOUS
  Filled 2014-03-30: qty 2

## 2014-03-30 MED ORDER — PROPOFOL 10 MG/ML IV BOLUS
INTRAVENOUS | Status: DC | PRN
  Administered 2014-03-30: 200 mg via INTRAVENOUS

## 2014-03-30 MED ORDER — MIDAZOLAM HCL 2 MG/2ML IJ SOLN
INTRAMUSCULAR | Status: AC
  Filled 2014-03-30: qty 2

## 2014-03-30 MED ORDER — STERILE WATER FOR INJECTION IJ SOLN
INTRAMUSCULAR | Status: AC
  Filled 2014-03-30: qty 10

## 2014-03-30 MED ORDER — ONDANSETRON HCL 4 MG/2ML IJ SOLN
INTRAMUSCULAR | Status: AC
  Filled 2014-03-30: qty 2

## 2014-03-30 MED ORDER — ALUM & MAG HYDROXIDE-SIMETH 200-200-20 MG/5ML PO SUSP: 30.0000 mL | ORAL | Status: DC | PRN

## 2014-03-30 MED ORDER — CEFAZOLIN SODIUM-DEXTROSE 2-3 GM-% IV SOLR
2.0000 g | Freq: Four times a day (QID) | INTRAVENOUS | Status: AC
  Administered 2014-03-30 (×2): 2 g via INTRAVENOUS
  Filled 2014-03-30 (×2): qty 50

## 2014-03-30 MED ORDER — LACTATED RINGERS IV SOLN
INTRAVENOUS | Status: DC
  Administered 2014-03-30: 09:00:00 via INTRAVENOUS

## 2014-03-30 MED ORDER — MIDAZOLAM HCL 5 MG/5ML IJ SOLN
INTRAMUSCULAR | Status: DC | PRN
  Administered 2014-03-30: 2 mg via INTRAVENOUS

## 2014-03-30 MED ORDER — ARTIFICIAL TEARS OP OINT
TOPICAL_OINTMENT | OPHTHALMIC | Status: AC
  Filled 2014-03-30: qty 3.5

## 2014-03-30 MED ORDER — ENOXAPARIN SODIUM 30 MG/0.3ML ~~LOC~~ SOLN
30.0000 mg | SUBCUTANEOUS | Status: DC
  Administered 2014-03-31: 30 mg via SUBCUTANEOUS
  Filled 2014-03-30 (×2): qty 0.3

## 2014-03-30 MED ORDER — OXYCODONE HCL 5 MG PO TABS
15.0000 mg | ORAL_TABLET | ORAL | Status: DC | PRN
  Administered 2014-03-30 – 2014-03-31 (×2): 15 mg via ORAL
  Filled 2014-03-30 (×2): qty 3

## 2014-03-30 MED ORDER — STERILE WATER FOR IRRIGATION IR SOLN
Status: DC | PRN
  Administered 2014-03-30: 1000 mL

## 2014-03-30 MED ORDER — ONDANSETRON HCL 4 MG/2ML IJ SOLN
INTRAMUSCULAR | Status: DC | PRN
  Administered 2014-03-30: 4 mg via INTRAVENOUS

## 2014-03-30 MED ORDER — ACETAMINOPHEN 10 MG/ML IV SOLN
1000.0000 mg | Freq: Four times a day (QID) | INTRAVENOUS | Status: AC
Start: 2014-03-30 — End: 2014-03-31
  Administered 2014-03-30 – 2014-03-31 (×4): 1000 mg via INTRAVENOUS
  Filled 2014-03-30 (×3): qty 100

## 2014-03-30 MED ORDER — SODIUM CHLORIDE 0.9 % IR SOLN
Status: DC | PRN
  Administered 2014-03-30 (×3): 1000 mL

## 2014-03-30 MED ORDER — FENTANYL CITRATE 0.05 MG/ML IJ SOLN
INTRAMUSCULAR | Status: AC
  Administered 2014-03-30: 100 ug via INTRAVENOUS
  Filled 2014-03-30: qty 2

## 2014-03-30 SURGICAL SUPPLY — 76 items
BANDAGE ESMARK 6X9 LF (GAUZE/BANDAGES/DRESSINGS) ×1 IMPLANT
BLADE 10 SAFETY STRL DISP (BLADE) ×1 IMPLANT
BLADE SAGITTAL 25.0X1.19X90 (BLADE) ×2 IMPLANT
BLADE SAGITTAL 25.0X1.19X90MM (BLADE) ×1
BLADE SAW SGTL 11.0X1.19X90.0M (BLADE) IMPLANT
BLADE SAW SGTL 13.0X1.19X90.0M (BLADE) ×3 IMPLANT
BLADE SURG 10 STRL SS (BLADE) ×6 IMPLANT
BNDG CMPR 9X6 STRL LF SNTH (GAUZE/BANDAGES/DRESSINGS) ×1
BNDG CMPR MED 15X6 ELC VLCR LF (GAUZE/BANDAGES/DRESSINGS) ×1
BNDG ELASTIC 6X15 VLCR STRL LF (GAUZE/BANDAGES/DRESSINGS) ×3 IMPLANT
BNDG ESMARK 6X9 LF (GAUZE/BANDAGES/DRESSINGS) ×3
BOWL SMART MIX CTS (DISPOSABLE) ×3 IMPLANT
CAPT RP KNEE ×2 IMPLANT
CEMENT HV SMART SET (Cement) ×6 IMPLANT
CLOSURE WOUND 1/2 X4 (GAUZE/BANDAGES/DRESSINGS) ×1
COVER SURGICAL LIGHT HANDLE (MISCELLANEOUS) ×3 IMPLANT
CUFF TOURNIQUET SINGLE 34IN LL (TOURNIQUET CUFF) ×3 IMPLANT
CUFF TOURNIQUET SINGLE 44IN (TOURNIQUET CUFF) IMPLANT
DRAPE EXTREMITY T 121X128X90 (DRAPE) ×3 IMPLANT
DRAPE INCISE IOBAN 66X45 STRL (DRAPES) ×3 IMPLANT
DRAPE PROXIMA HALF (DRAPES) ×3 IMPLANT
DRAPE U-SHAPE 47X51 STRL (DRAPES) ×3 IMPLANT
DRSG ADAPTIC 3X8 NADH LF (GAUZE/BANDAGES/DRESSINGS) ×3 IMPLANT
DRSG PAD ABDOMINAL 8X10 ST (GAUZE/BANDAGES/DRESSINGS) ×2 IMPLANT
DURAPREP 26ML APPLICATOR (WOUND CARE) ×6 IMPLANT
ELECT CAUTERY BLADE 6.4 (BLADE) ×3 IMPLANT
ELECT REM PT RETURN 9FT ADLT (ELECTROSURGICAL) ×3
ELECTRODE REM PT RTRN 9FT ADLT (ELECTROSURGICAL) ×1 IMPLANT
EVACUATOR 1/8 PVC DRAIN (DRAIN) ×3 IMPLANT
FACESHIELD WRAPAROUND (MASK) ×9 IMPLANT
FACESHIELD WRAPAROUND OR TEAM (MASK) ×1 IMPLANT
GLOVE BIO SURGEON STRL SZ7 (GLOVE) ×7 IMPLANT
GLOVE BIOGEL PI IND STRL 6.5 (GLOVE) IMPLANT
GLOVE BIOGEL PI IND STRL 7.0 (GLOVE) ×1 IMPLANT
GLOVE BIOGEL PI IND STRL 7.5 (GLOVE) ×1 IMPLANT
GLOVE BIOGEL PI INDICATOR 6.5 (GLOVE) ×4
GLOVE BIOGEL PI INDICATOR 7.0 (GLOVE) ×4
GLOVE BIOGEL PI INDICATOR 7.5 (GLOVE) ×2
GLOVE SS BIOGEL STRL SZ 7.5 (GLOVE) ×1 IMPLANT
GLOVE SUPERSENSE BIOGEL SZ 7.5 (GLOVE) ×2
GLOVE SURG SS PI 6.0 STRL IVOR (GLOVE) ×2 IMPLANT
GLOVE SURG SS PI 6.5 STRL IVOR (GLOVE) ×2 IMPLANT
GOWN STRL REUS W/ TWL LRG LVL3 (GOWN DISPOSABLE) ×2 IMPLANT
GOWN STRL REUS W/ TWL XL LVL3 (GOWN DISPOSABLE) ×2 IMPLANT
GOWN STRL REUS W/TWL LRG LVL3 (GOWN DISPOSABLE) ×6
GOWN STRL REUS W/TWL XL LVL3 (GOWN DISPOSABLE) ×6
HANDPIECE INTERPULSE COAX TIP (DISPOSABLE) ×3
HOOD PEEL AWAY FACE SHEILD DIS (HOOD) ×6 IMPLANT
IMMOBILIZER KNEE 22 UNIV (SOFTGOODS) ×2 IMPLANT
KIT BASIN OR (CUSTOM PROCEDURE TRAY) ×3 IMPLANT
KIT ROOM TURNOVER OR (KITS) ×3 IMPLANT
MANIFOLD NEPTUNE II (INSTRUMENTS) ×3 IMPLANT
NS IRRIG 1000ML POUR BTL (IV SOLUTION) ×3 IMPLANT
PACK TOTAL JOINT (CUSTOM PROCEDURE TRAY) ×3 IMPLANT
PAD ABD 8X10 STRL (GAUZE/BANDAGES/DRESSINGS) ×2 IMPLANT
PAD ARMBOARD 7.5X6 YLW CONV (MISCELLANEOUS) ×6 IMPLANT
PAD CAST 4YDX4 CTTN HI CHSV (CAST SUPPLIES) ×1 IMPLANT
PADDING CAST COTTON 4X4 STRL (CAST SUPPLIES)
PADDING CAST COTTON 6X4 STRL (CAST SUPPLIES) ×3 IMPLANT
RUBBERBAND STERILE (MISCELLANEOUS) ×1 IMPLANT
SET HNDPC FAN SPRY TIP SCT (DISPOSABLE) ×1 IMPLANT
SPONGE GAUZE 4X4 12PLY (GAUZE/BANDAGES/DRESSINGS) ×1 IMPLANT
SPONGE GAUZE 4X4 12PLY STER LF (GAUZE/BANDAGES/DRESSINGS) ×2 IMPLANT
STRIP CLOSURE SKIN 1/2X4 (GAUZE/BANDAGES/DRESSINGS) ×2 IMPLANT
SUCTION FRAZIER TIP 10 FR DISP (SUCTIONS) ×3 IMPLANT
SUT ETHIBOND NAB CT1 #1 30IN (SUTURE) ×6 IMPLANT
SUT MNCRL AB 3-0 PS2 18 (SUTURE) ×3 IMPLANT
SUT VIC AB 0 CT1 27 (SUTURE) ×6
SUT VIC AB 0 CT1 27XBRD ANBCTR (SUTURE) ×2 IMPLANT
SUT VIC AB 2-0 CT1 27 (SUTURE) ×6
SUT VIC AB 2-0 CT1 TAPERPNT 27 (SUTURE) ×2 IMPLANT
SYR 30ML SLIP (SYRINGE) ×3 IMPLANT
TOWEL OR 17X24 6PK STRL BLUE (TOWEL DISPOSABLE) ×3 IMPLANT
TOWEL OR 17X26 10 PK STRL BLUE (TOWEL DISPOSABLE) ×3 IMPLANT
TRAY FOLEY CATH 16FR SILVER (SET/KITS/TRAYS/PACK) ×3 IMPLANT
WATER STERILE IRR 1000ML POUR (IV SOLUTION) ×4 IMPLANT

## 2014-03-30 NOTE — Anesthesia Preprocedure Evaluation (Addendum)
Anesthesia Evaluation  Patient identified by MRN, date of birth, ID band Patient awake    Reviewed: Allergy & Precautions, H&P , NPO status , Patient's Chart, lab work & pertinent test results  Airway Mallampati: III TM Distance: >3 FB Neck ROM: Full    Dental no notable dental hx. (+) Poor Dentition, Chipped,    Pulmonary Current Smoker,  breath sounds clear to auscultation  Pulmonary exam normal       Cardiovascular hypertension, Pt. on medications Rhythm:Regular Rate:Normal     Neuro/Psych negative neurological ROS  negative psych ROS   GI/Hepatic negative GI ROS, Neg liver ROS,   Endo/Other  Obesity  Renal/GU Renal diseaseMedullary cystic disease  negative genitourinary   Musculoskeletal  (+) Arthritis -, Osteoarthritis,    Abdominal (+) + obese,   Peds  Hematology negative hematology ROS (+)   Anesthesia Other Findings   Reproductive/Obstetrics                        Anesthesia Physical Anesthesia Plan  ASA: II  Anesthesia Plan: General   Post-op Pain Management:    Induction: Intravenous  Airway Management Planned: LMA  Additional Equipment:   Intra-op Plan:   Post-operative Plan: Extubation in OR  Informed Consent: I have reviewed the patients History and Physical, chart, labs and discussed the procedure including the risks, benefits and alternatives for the proposed anesthesia with the patient or authorized representative who has indicated his/her understanding and acceptance.   Dental advisory given  Plan Discussed with: CRNA, Anesthesiologist and Surgeon  Anesthesia Plan Comments:         Anesthesia Quick Evaluation

## 2014-03-30 NOTE — Op Note (Signed)
MRN:     [id number] DOB/AGE:    05/12/1971 / 43 y.o.       OPERATIVE REPORT    DATE OF PROCEDURE:  03/30/2014       PREOPERATIVE DIAGNOSIS:   DJD LEFT KNEE      Estimated body mass index is 34.98 kg/(m^2) as calculated from the following:   Height as of 03/23/14: 5\' 8"  (1.727 m).   Weight as of this encounter: 104.327 kg (230 lb).                                                        POSTOPERATIVE DIAGNOSIS:   DJD LEFT KNEE                                                                      PROCEDURE:  Procedure(s): TOTAL KNEE ARTHROPLASTY Using Depuy Sigma RP implants #3 Femur, #5Tibia, 12.405mm sigma RP bearing, 35 Patella     SURGEON: Nilda Simmerobert A Lashuna Tamashiro    ASSISTANT:  Kirstin Shepperson PA-C   (Present and scrubbed throughout the case, critical for assistance with exposure, retraction, instrumentation, and closure.)         ANESTHESIA: GET with Femoral Nerve Block  DRAINS: foley, 2 medium hemovac in knee   TOURNIQUET TIME: 75min   COMPLICATIONS:  None     SPECIMENS: None   INDICATIONS FOR PROCEDURE: The patient has  DJD LEFT KNEE, varus deformities, XR shows bone on bone arthritis. Patient has failed all conservative measures including anti-inflammatory medicines, narcotics, attempts at  exercise and weight loss, cortisone injections and viscosupplementation.  Risks and benefits of surgery have been discussed, questions answered.   DESCRIPTION OF PROCEDURE: The patient identified by armband, received  right femoral nerve block and IV antibiotics, in [facility]. Patient taken to the operating room, appropriate anesthetic  monitors were attached General endotracheal anesthesia induced with  the patient in supine position, Foley catheter was inserted. Tourniquet  applied high to the operative thigh. Lateral post and foot positioner  applied to the table, the lower extremity was then prepped and draped  in usual sterile fashion from the ankle to the  tourniquet. Time-out procedure was performed. The limb was wrapped with an Esmarch bandage and the tourniquet inflated to 365 mmHg. We began the operation by making the anterior midline incision starting at handbreadth above the patella going over the patella 1 cm medial to and  4 cm distal to the tibial tubercle. Small bleeders in the skin and the  subcutaneous tissue identified and cauterized. Transverse retinaculum was incised and reflected medially and a medial parapatellar arthrotomy was accomplished. the patella was everted and theprepatellar fat pad resected. The superficial medial collateral  ligament was then elevated from anterior to posterior along the proximal  flare of the tibia and anterior half of the menisci resected. The knee was hyperflexed exposing bone on bone arthritis. Peripheral and notch osteophytes as well as the cruciate ligaments were then resected. We continued to  work our way around posteriorly along the proximal tibia, and externally  rotated the tibia subluxing it out from underneath the femur. A McHale  retractor was placed through the notch and a lateral Hohmann retractor  placed, and we then drilled through the proximal tibia in line with the  axis of the tibia followed by an intramedullary guide rod and 2-degree  posterior slope cutting guide. The tibial cutting guide was pinned into place  allowing resection of 4 mm of bone medially and about 6 mm of bone  laterally because of her varus deformity. Satisfied with the tibial resection, we then  entered the distal femur 2 mm anterior to the PCL origin with the  intramedullary guide rod and applied the distal femoral cutting guide  set at 11mm, with 5 degrees of valgus. This was pinned along the  epicondylar axis. At this point, the distal femoral cut was accomplished without difficulty. We then sized for a #3 femoral component and pinned the guide in 3 degrees of external rotation.The chamfer cutting guide was pinned  into place. The anterior, posterior, and chamfer cuts were accomplished without difficulty followed by  the Sigma RP box cutting guide and the box cut. We also removed posterior osteophytes from the posterior femoral condyles. At this  time, the knee was brought into full extension. We checked our  extension and flexion gaps and found them symmetric at 12.795mm.  The patella thickness measured at 28 mm. We set the cutting guide at 18 and removed the posterior 9.5-10 mm  of the patella sized for 35 button and drilled the lollipop. The knee  was then once again hyperflexed exposing the proximal tibia. We sized for a #5 tibial base plate, applied the smokestack and the conical reamer followed by the the Delta fin keel punch. We then hammered into place the Sigma RP trial femoral component, inserted a 12.5-mm trial bearing, trial patellar button, and took the knee through range of motion from 0-130 degrees. No thumb pressure was required for patellar  tracking. At this point, all trial components were removed, a double batch of DePuy HV cement was mixed and applied to all bony metallic mating surfaces except for the posterior condyles of the femur itself. In order, we  hammered into place the tibial tray and removed excess cement, the femoral component and removed excess cement, a 12.5-mm Sigma RP bearing  was inserted, and the knee brought to full extension with compression.  The patellar button was clamped into place, and excess cement  removed. While the cement cured the wound was irrigated out with normal saline solution pulse lavage, and medium Hemovac drains were placed.. Ligament stability and patellar tracking were checked and found to be excellent. The tourniquet was then released and hemostasis was obtained with cautery. The parapatellar arthrotomy was closed with  #1 ethibond suture. The subcutaneous tissue with 0 and 2-0 undyed  Vicryl suture, and 4-0 Monocryl.. A dressing of Xeroform,  4 x 4,  dressing sponges, Webril, and Ace wrap applied. Needle and sponge count were correct times 2.The patient awakened, extubated, and taken to recovery room without difficulty. Vascular status was normal, pulses 2+ and symmetric.   Nilda Simmerobert A Rebbie Lauricella 03/30/2014, 11:19 AM

## 2014-03-30 NOTE — H&P (View-Only) (Signed)
TOTAL KNEE ADMISSION H&P  Patient is being admitted for left total knee arthroplasty.  Subjective:  Chief Complaint:left knee pain.  HPI: Nathan Ramos, 43 y.o. male, has a history of pain and functional disability in the left knee due to arthritis and has failed non-surgical conservative treatments for greater than 12 weeks to includeNSAID's and/or analgesics, corticosteriod injections, viscosupplementation injections, flexibility and strengthening excercises, supervised PT with diminished ADL's post treatment, weight reduction as appropriate and activity modification.  Onset of symptoms was gradual, starting 10 years ago with gradually worsening course since that time. The patient noted no past surgery on the left knee(s).  Patient currently rates pain in the left knee(s) at 10 out of 10 with activity. Patient has night pain, worsening of pain with activity and weight bearing, pain that interferes with activities of daily living, crepitus and joint swelling.  Patient has evidence of subchondral sclerosis, periarticular osteophytes and joint space narrowing by imaging studies. There is no active infection.  Patient Active Problem List   Diagnosis Date Noted  . Medullary cystic disease of the kidney   . Left knee DJD   . Chronic pain   . Hypertension    Past Medical History  Diagnosis Date  . 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 Left knee DJD   . Gout   . Chronic pain     Destry Taylor CFNP at [facility]  . Hypertension     Past Surgical History  Procedure Laterality Date  . Carpal tunnel release Left   . Umbilical hernia repair  2010  . Cataract extraction w/ intraocular lens implant Left 2008  . Cataract extraction w/ intraocular lens implant Right 2008     (Not in a hospital admission) Allergies  Allergen Reactions  . Nsaids Other (See Comments)    impaired renal function    History  Substance Use Topics  . Smoking  status: Current Every Day Smoker -- 0.50 packs/day    Types: Cigarettes  . Smokeless tobacco: Not on file  . Alcohol Use: No    Family History  Problem Relation Age of Onset  . COPD Mother   . Stroke Father 49  . Kidney disease Mother      Review of Systems  Constitutional: Negative.   HENT: Negative.   Eyes: Negative.   Respiratory: Negative.   Cardiovascular: Negative.   Gastrointestinal: Negative.   Genitourinary: Negative.   Musculoskeletal: Positive for joint pain. Negative for back pain, falls, myalgias and neck pain.  Skin: Negative.   Endo/Heme/Allergies: Negative.   Psychiatric/Behavioral: Negative.     Objective:  Physical Exam  Constitutional: He is oriented to person, place, and time. He appears well-developed and well-nourished.  HENT:  Head: Normocephalic and atraumatic.  Mouth/Throat: Oropharynx is clear and moist.  Eyes: Conjunctivae are normal. Pupils are equal, round, and reactive to light.  Neck: Neck supple.  Cardiovascular: Normal rate and regular rhythm.   Respiratory: Effort normal and breath sounds normal.  GI: Soft. Bowel sounds are normal.  Genitourinary:  Not pertinent to current symptomatology therefore not examined.  Musculoskeletal:  . Examination of both knees reveals left knee is significantly more painful than right, 1+ synovitis 1+ crepitation, left knee shows range of motion -5 to 115 degrees with pain, range of motion has range of motion 0-125 degrees with pain. Vascular exam: pulses 2+ and symmetric.  Neurological: He is alert and oriented to person, place, and time.    Skin: Skin is warm and dry.  Psychiatric: He has a normal mood and affect. His behavior is normal.    Vital signs in last 24 hours: @VSRANGES @  Labs:   There is no height or weight on file to calculate BMI.   Imaging Review Plain radiographs demonstrate severe degenerative joint disease of the left knee(s). The overall alignment issignificant varus. The bone  quality appears to be good for age and reported activity level.  Assessment/Plan:  End stage arthritis, right knee   The patient history, physical examination, clinical judgment of the provider and imaging studies are consistent with end stage degenerative joint disease of the right knee(s) and total knee arthroplasty is deemed medically necessary. The treatment options including medical management, injection therapy arthroscopy and arthroplasty were discussed at length. The risks and benefits of total knee arthroplasty were presented and reviewed. The risks due to aseptic loosening, infection, stiffness, patella tracking problems, thromboembolic complications and other imponderables were discussed. The patient acknowledged the explanation, agreed to proceed with the plan and consent was signed. Patient is being admitted for inpatient treatment for surgery, pain control, PT, OT, prophylactic antibiotics, VTE prophylaxis, progressive ambulation and ADL's and discharge planning. The patient is planning to be discharged home with home health services Jean Alejos A. Gwinda PasseShepperson, PA-C Physician Assistant [facility] [phone]  03/18/2014, 3:36 PM

## 2014-03-30 NOTE — Progress Notes (Signed)
Physical Therapy Evaluation Patient Details Name: Nathan GroomsStacey L Ramos MRN: [id number] DOB: 05/24/1971 Today's Date: 03/30/2014   History of Present Illness  Patient is a 43 yo male admitted 03/30/14 and is s/p Lt TKA.  Patient with h/o HTN.  Clinical Impression  Patient presents with problems listed below.  Will benefit from acute PT to maximize independence prior to discharge home with family.    Follow Up Recommendations Home health PT;Supervision/Assistance - 24 hour    Equipment Recommendations  None recommended by PT    Recommendations for Other Services       Precautions / Restrictions Precautions Precautions: Knee Precaution Booklet Issued: Yes (comment) Precaution Comments: Reviewed precautions with patient Required Braces or Orthoses: Knee Immobilizer - Left Knee Immobilizer - Left: On when out of bed or walking;Discontinue once straight leg raise with < 10 degree lag Restrictions Weight Bearing Restrictions: Yes LLE Weight Bearing: Weight bearing as tolerated      Mobility  Bed Mobility Overal bed mobility: Needs Assistance Bed Mobility: Supine to Sit     Supine to sit: Min guard     General bed mobility comments: Instructed patient on donning KI on LLE.  Verbal cues for technique for bed mobility.  Patient able to perform without physical assist.  Transfers Overall transfer level: Needs assistance Equipment used: Rolling walker (2 wheeled) Transfers: Sit to/from Stand Sit to Stand: Min assist;From elevated surface         General transfer comment: Verbal cues for hand placement.  Min assist to rise to standing for balance.  Ambulation/Gait Ambulation/Gait assistance: Min guard Ambulation Distance (Feet): 20 Feet Assistive device: Rolling walker (2 wheeled) Gait Pattern/deviations: Step-to pattern;Decreased stance time - left;Decreased step length - right;Antalgic Gait velocity: Decreased Gait velocity interpretation: Below normal speed for  age/gender General Gait Details: Verbal cues for safe use of RW, gait sequence.  Stairs            Wheelchair Mobility    Modified Rankin (Stroke Patients Only)       Balance                                             Pertinent Vitals/Pain     Home Living Family/patient expects to be discharged to:: Private residence Living Arrangements: Spouse/significant other;Children Available Help at Discharge: Family;Available 24 hours/day Type of Home: House Home Access: Stairs to enter Entrance Stairs-Rails: Doctor, general practiceight;Left Entrance Stairs-Number of Steps: 4 Home Layout: One level Home Equipment: Walker - 2 wheels;Bedside commode      Prior Function Level of Independence: Independent               Hand Dominance        Extremity/Trunk Assessment   Upper Extremity Assessment: Overall WFL for tasks assessed           Lower Extremity Assessment: LLE deficits/detail   LLE Deficits / Details: Decreased knee strength and ROM due to surgery/pain.  Cervical / Trunk Assessment: Normal  Communication   Communication: No difficulties  Cognition Arousal/Alertness: Awake/alert Behavior During Therapy: WFL for tasks assessed/performed Overall Cognitive Status: Within Functional Limits for tasks assessed                      General Comments      Exercises Total Joint Exercises Ankle Circles/Pumps: AROM;Both;10 reps;Seated Quad Sets: AROM;Left;10 reps;Seated  Assessment/Plan    PT Assessment Patient needs continued PT services  PT Diagnosis Difficulty walking;Acute pain   PT Problem List Decreased strength;Decreased range of motion;Decreased activity tolerance;Decreased balance;Decreased mobility;Decreased knowledge of use of DME;Decreased knowledge of precautions;Pain  PT Treatment Interventions DME instruction;Gait training;Stair training;Functional mobility training;Therapeutic exercise;Patient/family education   PT Goals  (Current goals can be found in the Care Plan section) Acute Rehab PT Goals Patient Stated Goal: To go home soon PT Goal Formulation: With patient Time For Goal Achievement: 04/06/14 Potential to Achieve Goals: Good    Frequency 7X/week   Barriers to discharge        Co-evaluation               End of Session Equipment Utilized During Treatment: Gait belt;Left knee immobilizer Activity Tolerance: Patient tolerated treatment well Patient left: in chair;with call bell/phone within reach Nurse Communication: Mobility status (Request for foley catheter to be removed)         Time: [id number] PT Time Calculation (min): 20 min   Charges:   PT Evaluation $Initial PT Evaluation Tier I: 1 Procedure PT Treatments $Gait Training: 8-22 mins   PT G Codes:          Nathan AustriaSusan H Karyme Ramos 03/30/2014, 8:05 PM Nathan HurtSusan H. Renaldo Ramos, PT, [facility] Acute Rehab Services Pager [phone]

## 2014-03-30 NOTE — Interval H&P Note (Signed)
History and Physical Interval Note:  03/30/2014 9:23 AM  Sinclair GroomsStacey L Schartz  has presented today for surgery, with the diagnosis of DJD LEFT KNEE  The various methods of treatment have been discussed with the patient and family. After consideration of risks, benefits and other options for treatment, the patient has consented to  Procedure(s): TOTAL KNEE ARTHROPLASTY (Left) as a surgical intervention .  The patient's history has been reviewed, patient examined, no change in status, stable for surgery.  I have reviewed the patient's chart and labs.  Questions were answered to the patient's satisfaction.     Nilda Simmerobert A Eily Louvier

## 2014-03-30 NOTE — Progress Notes (Signed)
Orthopedic Tech Progress Note Patient Details:  Nathan Ramos 04/28/1971 [id number] CPM applied to LLE with appropriate settings. OHF applied to bed. Footsie roll provided. CPM Left Knee CPM Left Knee: On Left Knee Flexion (Degrees): 60 Left Knee Extension (Degrees): 0   Asia R Thompson 03/30/2014, 1:29 PM

## 2014-03-30 NOTE — Anesthesia Postprocedure Evaluation (Signed)
  Anesthesia Post-op Note  Patient: Nathan Ramos  Procedure(s) Performed: Procedure(s): TOTAL KNEE ARTHROPLASTY (Left)  Patient Location: PACU  Anesthesia Type:General and GA combined with regional for post-op pain  Level of Consciousness: awake, alert  and oriented  Airway and Oxygen Therapy: Patient Spontanous Breathing and Patient connected to nasal cannula oxygen  Post-op Pain: mild  Post-op Assessment: Post-op Vital signs reviewed, Patient's Cardiovascular Status Stable, Respiratory Function Stable, Patent Airway and Pain level controlled  Post-op Vital Signs: stable  Last Vitals:  Filed Vitals:   03/30/14 1325  BP: 130/80  Pulse: 91  Temp:   Resp: 14    Complications: No apparent anesthesia complications

## 2014-03-30 NOTE — Anesthesia Procedure Notes (Addendum)
Anesthesia Regional Block:  Femoral nerve block  Pre-Anesthetic Checklist: ,, timeout performed, Correct Patient, Correct Site, Correct Laterality, Correct Procedure, Correct Position, site marked, Risks and benefits discussed,  Surgical consent,  Pre-op evaluation,  At surgeon's request and post-op pain management Needles:   Needle Type: Stimulator Needle - 40     Needle Length: 4cm 4 cm Needle Gauge: 22 and 22 G    Additional Needles:  Procedures: ultrasound guided (picture in chart) Femoral nerve block Narrative:  Start time: 03/30/2014 8:50 AM End time: 03/30/2014 9:00 AM  Performed by: Personally  Anesthesiologist: M. Malen GauzeFoster, MD   Procedure Name: LMA Insertion Date/Time: 03/30/2014 9:37 AM Performed by: Margaree MackintoshYACOUB, Ama Mcmaster B Pre-anesthesia Checklist: Patient identified, Timeout performed, Emergency Drugs available, Suction available and Patient being monitored Oxygen Delivery Method: Circle system utilized Preoxygenation: Pre-oxygenation with 100% oxygen Intubation Type: IV induction LMA: LMA inserted LMA Size: 5.0 Number of attempts: 1 Placement Confirmation: positive ETCO2 and breath sounds checked- equal and bilateral Tube secured with: Tape Dental Injury: Teeth and Oropharynx as per pre-operative assessment

## 2014-03-30 NOTE — Progress Notes (Signed)
Utilization review completed.  

## 2014-03-30 NOTE — Transfer of Care (Signed)
Immediate Anesthesia Transfer of Care Note  Patient: Nathan Ramos  Procedure(s) Performed: Procedure(s): TOTAL KNEE ARTHROPLASTY (Left)  Patient Location: PACU  Anesthesia Type:GA combined with regional for post-op pain  Level of Consciousness: awake, alert  and oriented  Airway & Oxygen Therapy: Patient Spontanous Breathing and Patient connected to nasal cannula oxygen  Post-op Assessment: Report given to PACU RN and Post -op Vital signs reviewed and stable  Post vital signs: Reviewed and stable  Complications: No apparent anesthesia complications

## 2014-03-31 ENCOUNTER — Encounter (HOSPITAL_COMMUNITY): Payer: Self-pay | Admitting: Orthopedic Surgery

## 2014-03-31 LAB — BASIC METABOLIC PANEL
BUN: 55 mg/dL — ABNORMAL HIGH (ref 6–23)
CO2: 22 mEq/L (ref 19–32)
Calcium: 8.7 mg/dL (ref 8.4–10.5)
Chloride: 105 mEq/L (ref 96–112)
Creatinine, Ser: 2.27 mg/dL — ABNORMAL HIGH (ref 0.50–1.35)
GFR calc Af Amer: 40 mL/min — ABNORMAL LOW (ref >90)
GFR calc non Af Amer: 34 mL/min — ABNORMAL LOW (ref >90)
Glucose, Bld: 194 mg/dL — ABNORMAL HIGH (ref 70–99)
Potassium: 5.2 mEq/L (ref 3.7–5.3)
Sodium: 140 mEq/L (ref 137–147)

## 2014-03-31 LAB — CBC
HCT: 27.6 % — ABNORMAL LOW (ref 39.0–52.0)
Hemoglobin: 9.1 g/dL — ABNORMAL LOW (ref 13.0–17.0)
MCH: 29.1 pg (ref 26.0–34.0)
MCHC: 33 g/dL (ref 30.0–36.0)
MCV: 88.2 fL (ref 78.0–100.0)
Platelets: 234 10*3/uL (ref 150–400)
RBC: 3.13 MIL/uL — ABNORMAL LOW (ref 4.22–5.81)
RDW: 12.8 % (ref 11.5–15.5)
WBC: 13.8 10*3/uL — ABNORMAL HIGH (ref 4.0–10.5)

## 2014-03-31 MED ORDER — METHOCARBAMOL 500 MG PO TABS
500.0000 mg | ORAL_TABLET | Freq: Four times a day (QID) | ORAL | Status: DC | PRN
  Administered 2014-03-31: 500 mg via ORAL
  Filled 2014-03-31: qty 1

## 2014-03-31 MED ORDER — OXYCODONE HCL 15 MG PO TABS: 15.0000 mg | ORAL_TABLET | ORAL | Status: DC | PRN

## 2014-03-31 MED ORDER — MORPHINE SULFATE ER 60 MG PO TBCR: 60.0000 mg | EXTENDED_RELEASE_TABLET | Freq: Two times a day (BID) | ORAL | Status: DC

## 2014-03-31 MED ORDER — SODIUM CHLORIDE 0.9 % IV SOLN: INTRAVENOUS | Status: DC

## 2014-03-31 MED ORDER — POLYETHYLENE GLYCOL 3350 17 G PO PACK: 17.0000 g | PACK | Freq: Two times a day (BID) | ORAL | Status: DC

## 2014-03-31 MED ORDER — ACETAMINOPHEN 325 MG PO TABS: 650.0000 mg | ORAL_TABLET | Freq: Four times a day (QID) | ORAL | Status: DC | PRN

## 2014-03-31 MED ORDER — ENOXAPARIN SODIUM 30 MG/0.3ML ~~LOC~~ SOLN: 30.0000 mg | SUBCUTANEOUS | Status: DC

## 2014-03-31 NOTE — Evaluation (Signed)
Occupational Therapy Evaluation and Discharge Patient Details Name: Nathan GroomsStacey L Ramos MRN: [id number] DOB: 10/03/1971 Today's Date: 03/31/2014    History of Present Illness Patient is a 43 yo male admitted 03/30/14 and is s/p Lt TKA.  Patient with h/o HTN.   Clinical Impression   This 43 yo male admitted and underwent above presents to acute OT with all education completed,we will D/C from acute OT.    Follow Up Recommendations  No OT follow up    Equipment Recommendations  None recommended by OT       Precautions / Restrictions Precautions Precautions: Knee Precaution Comments: Reviewed precautions with patient Restrictions Weight Bearing Restrictions: Yes LLE Weight Bearing: Weight bearing as tolerated      Mobility Bed Mobility         Supine to sit: Supervision     General bed mobility comments: Pt up in recliner upon arrival  Transfers Overall transfer level: Needs assistance Equipment used: Rolling walker (2 wheeled) Transfers: Sit to/from Stand Sit to Stand: Supervision         General transfer comment: Verbal cues for hand placement.           ADL                                         General ADL Comments: Pt reports that he put his own clothes on post set-up and he has A prn at home if he is having any difficulty with BADLs. Practiced shower stall transfer with pt being S with VCs for sequencing/technique. S for 3n1 transfer simulated  from recliner>shower seat>recliner               Pertinent Vitals/Pain 2/10 in knee; repositioned and made RN aware        Extremity/Trunk Assessment Upper Extremity Assessment Upper Extremity Assessment: Overall WFL for tasks assessed           Communication Communication Communication: No difficulties   Cognition Arousal/Alertness: Awake/alert Behavior During Therapy: WFL for tasks assessed/performed Overall Cognitive Status: Within Functional Limits for tasks assessed                                Home Living Family/patient expects to be discharged to:: Private residence Living Arrangements: Spouse/significant other Available Help at Discharge: Family;Available 24 hours/day Type of Home: House Home Access: Stairs to enter Entergy CorporationEntrance Stairs-Number of Steps: 4 Entrance Stairs-Rails: Right;Left Home Layout: One level     Bathroom Shower/Tub: Walk-in shower;Door   Foot LockerBathroom Toilet: Standard     Home Equipment: Environmental consultantWalker - 2 wheels;Bedside commode          Prior Functioning/Environment Level of Independence: Independent                      OT Goals(Current goals can be found in the care plan section) Acute Rehab OT Goals Patient Stated Goal: To go home today  OT Frequency:                End of Session Equipment Utilized During Treatment: Rolling walker CPM Left Knee CPM Left Knee: On Left Knee Flexion (Degrees): 60 Nurse Communication: Patient requests pain meds  Activity Tolerance: Patient tolerated treatment well Patient left: in chair;with call bell/phone within reach   Time: [id number] OT Time Calculation (min): 17 min Charges:  OT General Charges $OT Visit: 1 Procedure OT Evaluation $Initial OT Evaluation Tier I: 1 Procedure OT Treatments $Self Care/Home Management : 8-22 mins  Nathan GeorgesCatherine Eva Bynum Ramos [phone] 03/31/2014, 11:48 AM

## 2014-03-31 NOTE — Discharge Summary (Signed)
Patient ID: Nathan Ramos MRN: [id number] DOB/AGE: 41/07/1971 41 y.o.  Admit date: 03/30/2014 Discharge date: 03/31/2014  Admission Diagnoses:  Principal Problem:   Gout due to renal impairment Active Problems:   Medullary cystic disease of the kidney   Left knee DJD   Chronic pain   Hypertension   DJD (degenerative joint disease) of knee   Discharge Diagnoses:  Same  Past Medical History  Diagnosis Date  . 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 Left knee DJD   . Gout   . Chronic pain     Dest[facility] Ladona [facility]  . Hypertension   . Smoker   . CKD (chronic kidney disease)     stage III 03/2014 (Dr. Mosetta PigeonHarmeet Singh)    Surgeries: Procedure(s): TOTAL KNEE ARTHROPLASTY on 03/30/2014   Consultants:    Discharged Condition: Improved  Hospital Course: Nathan GroomsStacey L Ramos is an [age] y.o. male who was admitted 03/30/2014 for operative treatment ofGout due to renal impairment. Patient has severe unremitting pain that affects sleep, daily activities, and work/hobbies. After pre-op clearance the patient was taken to the operating room on 03/30/2014 and underwent  Procedure(s): TOTAL KNEE ARTHROPLASTY.    Patient was given perioperative antibiotics: Anti-infectives   Start     Dose/Rate Route Frequency Ordered Stop   03/30/14 1800  ceFAZolin (ANCEF) IVPB 2 g/50 mL premix     2 g 100 mL/hr over 30 Minutes Intravenous Every 6 hours 03/30/14 1543 03/30/14 2350   03/30/14 0600  ceFAZolin (ANCEF) IVPB 2 g/50 mL premix     2 g 100 mL/hr over 30 Minutes Intravenous 30 min pre-op 03/29/14 1033 03/30/14 0943       Patient was given sequential compression devices, early ambulation, and chemoprophylaxis to prevent DVT.  Patient benefited maximally from hospital stay and there were no complications.    Recent vital signs: Patient Vitals for the past 24 hrs:  BP Temp Temp src Pulse Resp SpO2  03/31/14 0546 125/78 mmHg 97.9 F (36.6 C)  Oral 76 - 99 %  03/31/14 0054 134/80 mmHg 97.9 F (36.6 C) Oral 84 18 99 %  03/30/14 2100 112/66 mmHg 97.6 F (36.4 C) Oral 84 18 99 %  03/30/14 1535 108/67 mmHg 97.4 F (36.3 C) - 81 19 97 %  03/30/14 1515 - - - 79 10 98 %  03/30/14 1510 118/71 mmHg - - 76 9 97 %  03/30/14 1505 - 97.2 F (36.2 C) - - - -  03/30/14 1500 - - - 71 8 98 %  03/30/14 1445 - - - 75 7 98 %  03/30/14 1440 106/62 mmHg - - 70 8 98 %  03/30/14 1430 - - - 70 9 98 %  03/30/14 1425 102/61 mmHg - - 71 9 98 %  03/30/14 1415 - - - 72 9 98 %  03/30/14 1410 91/58 mmHg - - 74 7 98 %  03/30/14 1400 - - - 75 7 98 %  03/30/14 1355 105/58 mmHg - - 73 8 98 %  03/30/14 1345 - - - 73 8 97 %  03/30/14 1340 92/55 mmHg - - 73 8 97 %  03/30/14 1330 - - - 79 9 96 %  03/30/14 1325 130/80 mmHg - - 91 14 98 %  03/30/14 1315 - - - 89 15 95 %  03/30/14 1300 - - - 92 15 100 %  03/30/14 1245 - - -  86 13 99 %  03/30/14 1230 - - - 96 14 98 %  03/30/14 1215 - - - 87 7 95 %  03/30/14 1209 - 97.8 F (36.6 C) - - - -  03/30/14 0913 - - - 78 15 99 %     Recent laboratory studies:  Recent Labs  03/30/14 1705 03/31/14 0455  WBC 12.2* 13.8*  HGB 10.8* 9.1*  HCT 32.6* 27.6*  PLT 219 234  NA  --  140  K  --  5.2  CL  --  105  CO2  --  22  BUN  --  55*  CREATININE 2.36* 2.27*  GLUCOSE  --  194*  CALCIUM  --  8.7     Discharge Medications:     Medication List    STOP taking these medications       oxyCODONE-acetaminophen 10-325 MG per tablet  Commonly known as:  PERCOCET      TAKE these medications       acetaminophen 325 MG tablet  Commonly known as:  TYLENOL  Take 2 tablets (650 mg total) by mouth every 6 (six) hours as needed for mild pain (or Fever >/= 101).     amLODipine 10 MG tablet  Commonly known as:  NORVASC  Take 10 mg by mouth daily.     enoxaparin 30 MG/0.3ML injection  Commonly known as:  LOVENOX  Inject 0.3 mLs (30 mg total) into the skin daily.     furosemide 40 MG tablet  Commonly known as:   LASIX  Take 40 mg by mouth daily.     losartan 50 MG tablet  Commonly known as:  COZAAR  Take 50 mg by mouth daily.     morphine 60 MG 12 hr tablet  Commonly known as:  MS CONTIN  Take 1 tablet (60 mg total) by mouth every 12 (twelve) hours.     oxyCODONE 15 MG immediate release tablet  Commonly known as:  ROXICODONE  Take 1 tablet (15 mg total) by mouth every 3 (three) hours as needed for breakthrough pain.     polyethylene glycol packet  Commonly known as:  MIRALAX / GLYCOLAX  Take 17 g by mouth 2 (two) times daily.     ULORIC 80 MG Tabs  Generic drug:  Febuxostat  Take 1 tablet by mouth daily.        Diagnostic Studies: Dg Chest 2 View  03/23/2014   CLINICAL DATA:  Hypertension.  EXAM: CHEST  2 VIEW  COMPARISON:  None.  FINDINGS: The heart size and mediastinal contours are within normal limits. Both lungs are clear. The visualized skeletal structures are unremarkable.  IMPRESSION: No acute cardiopulmonary abnormality seen.   Electronically Signed   By: Roque Lias M.D.   On: 03/23/2014 14:07    Disposition: Final discharge disposition not confirmed      Discharge Instructions   CPM    Complete by:  As directed   Continuous passive motion machine (CPM):      Use the CPM from 0 to 90 for 6 hours per day.       You may break it up into 2 or 3 sessions per day.      Use CPM for 2 weeks or until you are told to stop.     Call MD / Call 911    Complete by:  As directed   If you experience chest pain or shortness of breath, CALL 911 and be transported to the  hospital emergency room.  If you develope a fever above 101 F, pus (white drainage) or increased drainage or redness at the wound, or calf pain, call your surgeon's office.     Change dressing    Complete by:  As directed   Change the dressing daily with sterile 4 x 4 inch gauze dressing and apply TED hose.  You may clean the incision with alcohol prior to redressing.     Constipation Prevention    Complete by:  As  directed   Drink plenty of fluids.  Prune juice may be helpful.  You may use a stool softener, such as Colace (over the counter) 100 mg twice a day.  Use MiraLax (over the counter) for constipation as needed.     Diet - low sodium heart healthy    Complete by:  As directed      Discharge instructions    Complete by:  As directed   Total Knee Replacement Care After Refer to this sheet in the next few weeks. These discharge instructions provide you with general information on caring for yourself after you leave the hospital. Your caregiver may also give you specific instructions. Your treatment has been planned according to the most current medical practices available, but unavoidable complications sometimes occur. If you have any problems or questions after discharge, please call your caregiver. Regaining a near full range of motion of your knee within the first 3 to 6 weeks after surgery is critical. HOME CARE INSTRUCTIONS  You may resume a normal diet and activities as directed.  Perform exercises as directed.  Place gray foam block, curve side up under heel at all times except when in CPM or when walking.  DO NOT modify, tear, cut, or change in any way the gray foam block. You will receive physical therapy daily  Take showers instead of baths until informed otherwise.  You may shower on Sunday.  Please wash whole leg including wound with soap and water  Change bandages (dressings)daily It is OK to take over-the-counter tylenol in addition to the oxycodone for pain, discomfort, or fever. Oxycodone is VERY constipating.  Please take stool softener twice a day and laxatives daily until bowels are regular Eat a well-balanced diet.  Avoid lifting or driving until you are instructed otherwise.  Make an appointment to see your caregiver for stitches (suture) or staple removal as directed.  If you have been sent home with a continuous passive motion machine (CPM machine), 0-90 degrees 6 hrs a day   2  hrs a shift SEEK MEDICAL CARE IF: You have swelling of your calf or leg.  You develop shortness of breath or chest pain.  You have redness, swelling, or increasing pain in the wound.  There is pus or any unusual drainage coming from the surgical site.  You notice a bad smell coming from the surgical site or dressing.  The surgical site breaks open after sutures or staples have been removed.  There is persistent bleeding from the suture or staple line.  You are getting worse or are not improving.  You have any other questions or concerns.  SEEK IMMEDIATE MEDICAL CARE IF:  You have a fever.  You develop a rash.  You have difficulty breathing.  You develop any reaction or side effects to medicines given.  Your knee motion is decreasing rather than improving.  MAKE SURE YOU:  Understand these instructions.  Will watch your condition.  Will get help right away if you are  not doing well or get worse.     Do not put a pillow under the knee. Place it under the heel.    Complete by:  As directed   Place gray foam block, curve side up under heel at all times except when in CPM or when walking.  DO NOT modify, tear, cut, or change in any way the gray foam block.     Increase activity slowly as tolerated    Complete by:  As directed      TED hose    Complete by:  As directed   Use stockings (TED hose) for 2 weeks on both leg(s).  You may remove them at night for sleeping.           Follow-up Information   Follow up with Nilda SimmerWAINER,ROBERT A, MD On 04/13/2014. ([facility] 2:30 [facility])    Specialty:  Orthopedic Surgery   Contact information:   [address] [place] [address] [phone]        Signed: Pascal LuxKirstin J Dimitri Dsouza 03/31/2014, 9:10 AM

## 2014-03-31 NOTE — Care Management Note (Signed)
CARE MANAGEMENT NOTE 03/31/2014  Patient:  [name],[name]   Account Number:  [id number]  Date Initiated:  03/31/2014  Documentation initiated by:  Vance PeperBRADY,Prayan Ulin  Subjective/Objective Assessment:   43 yr old male s/pleft total knee arthroplasty.     Action/Plan:   Case manager spoke with patient concerning home health and DME needs at discharge. Choice offered. Patient preoperatively setup with [facility], no changes.   Anticipated DC Date:  03/31/2014   Anticipated DC Plan:  HOME W HOME HEALTH SERVICES      DC Planning Services  CM consult      [facility]  [facility]ALTH  DURABLE MEDICAL EQUIPMENT   Choice offered to / List presented to:  C-1 Patient   DME arranged  [facility]  3-N-1      DME agency  [employer]     HH arranged  HH-2 PT      HH agency  [facility]   Status of service:  Completed, signed off Medicare Important Message given?  NA - LOS <3 / Initial given by admissions (If response is "NO", the following Medicare IM given date fields will be blank) Date Medicare IM given:   Date Additional Medicare IM given:    Discharge Disposition:  HOME W HOME HEALTH SERVICES  Per UR Regulation:    If discussed at Long Length of Stay Meetings, dates discussed:    Comments:

## 2014-03-31 NOTE — Progress Notes (Signed)
Wife instructed on giving Lovenox inj verbalized understanding

## 2014-03-31 NOTE — Progress Notes (Signed)
Physical Therapy Treatment Patient Details Name: Nathan GroomsStacey L Ebel MRN: [id number] DOB: 02/15/1971 Today's Date: 03/31/2014    History of Present Illness Patient is a 43 yo male admitted 03/30/14 and is s/p Lt TKA.  Patient with h/o HTN.    PT Comments    PA in room getting patient ready for DC. Patient able to walk to gym and complete stair training. Reviewed HEP and CPM use with patient. Progressing well.   Follow Up Recommendations  Home health PT;Supervision/Assistance - 24 hour     Equipment Recommendations  None recommended by PT    Recommendations for Other Services       Precautions / Restrictions Precautions Precautions: Knee Precaution Comments: Reviewed precautions with patient Restrictions LLE Weight Bearing: Weight bearing as tolerated    Mobility  Bed Mobility         Supine to sit: Supervision        Transfers Overall transfer level: Needs assistance Equipment used: Rolling walker (2 wheeled) Transfers: Sit to/from Stand Sit to Stand: Min guard         General transfer comment: Verbal cues for hand placement.    Ambulation/Gait Ambulation/Gait assistance: Min guard Ambulation Distance (Feet): 250 Feet Assistive device: Rolling walker (2 wheeled) Gait Pattern/deviations: Step-through pattern;Antalgic     General Gait Details: Cues for looking forward and to heel strike, extend L knee.    Stairs Stairs: Yes Stairs assistance: Min guard Stair Management: Step to pattern;Forwards;Two rails Number of Stairs: 5 General stair comments: Cues for sequence and technique  Wheelchair Mobility    Modified Rankin (Stroke Patients Only)       Balance                                    Cognition Arousal/Alertness: Awake/alert Behavior During Therapy: WFL for tasks assessed/performed Overall Cognitive Status: Within Functional Limits for tasks assessed                      Exercises Total Joint Exercises Quad Sets:  AROM;Left;10 reps;Seated Heel Slides: AAROM;Left;10 reps Straight Leg Raises: AAROM;Left;10 reps Long Arc Quad: AAROM;Left;10 reps Goniometric ROM: 0-85 degrees AROM in sitting    General Comments        Pertinent Vitals/Pain no apparent distress     Home Living                      Prior Function            PT Goals (current goals can now be found in the care plan section) Progress towards PT goals: Progressing toward goals    Frequency  7X/week    PT Plan Current plan remains appropriate    Co-evaluation             End of Session Equipment Utilized During Treatment: Gait belt Activity Tolerance: Patient tolerated treatment well Patient left: in chair;with call bell/phone within reach     Time: [id number] PT Time Calculation (min): 28 min  Charges:  $Gait Training: 8-22 mins $Therapeutic Exercise: 8-22 mins                    G Codes:      Adline PotterJulia [name] [name] 03/31/2014, 8:20 AM  03/31/2014 Adline PotterJulia [name] [name] PTA [phone] pager [phone] office

## 2014-04-01 DIAGNOSIS — Z96659 Presence of unspecified artificial knee joint: Secondary | ICD-10-CM | POA: Diagnosis not present

## 2014-04-01 DIAGNOSIS — M109 Gout, unspecified: Secondary | ICD-10-CM | POA: Diagnosis not present

## 2014-04-01 DIAGNOSIS — M1710 Unilateral primary osteoarthritis, unspecified knee: Secondary | ICD-10-CM | POA: Diagnosis not present

## 2014-04-01 DIAGNOSIS — G8929 Other chronic pain: Secondary | ICD-10-CM | POA: Diagnosis not present

## 2014-04-01 DIAGNOSIS — IMO0001 Reserved for inherently not codable concepts without codable children: Secondary | ICD-10-CM | POA: Diagnosis not present

## 2014-04-01 DIAGNOSIS — Q615 Medullary cystic kidney: Secondary | ICD-10-CM | POA: Diagnosis not present

## 2014-04-01 DIAGNOSIS — Z471 Aftercare following joint replacement surgery: Secondary | ICD-10-CM | POA: Diagnosis not present

## 2014-04-01 DIAGNOSIS — N183 Chronic kidney disease, stage 3 unspecified: Secondary | ICD-10-CM | POA: Diagnosis not present

## 2014-04-01 DIAGNOSIS — I129 Hypertensive chronic kidney disease with stage 1 through stage 4 chronic kidney disease, or unspecified chronic kidney disease: Secondary | ICD-10-CM | POA: Diagnosis not present

## 2014-04-02 DIAGNOSIS — M1710 Unilateral primary osteoarthritis, unspecified knee: Secondary | ICD-10-CM | POA: Diagnosis not present

## 2014-04-02 DIAGNOSIS — Q615 Medullary cystic kidney: Secondary | ICD-10-CM | POA: Diagnosis not present

## 2014-04-02 DIAGNOSIS — I129 Hypertensive chronic kidney disease with stage 1 through stage 4 chronic kidney disease, or unspecified chronic kidney disease: Secondary | ICD-10-CM | POA: Diagnosis not present

## 2014-04-02 DIAGNOSIS — Z471 Aftercare following joint replacement surgery: Secondary | ICD-10-CM | POA: Diagnosis not present

## 2014-04-02 DIAGNOSIS — IMO0001 Reserved for inherently not codable concepts without codable children: Secondary | ICD-10-CM | POA: Diagnosis not present

## 2014-04-02 DIAGNOSIS — G8929 Other chronic pain: Secondary | ICD-10-CM | POA: Diagnosis not present

## 2014-04-03 DIAGNOSIS — I129 Hypertensive chronic kidney disease with stage 1 through stage 4 chronic kidney disease, or unspecified chronic kidney disease: Secondary | ICD-10-CM | POA: Diagnosis not present

## 2014-04-03 DIAGNOSIS — G8929 Other chronic pain: Secondary | ICD-10-CM | POA: Diagnosis not present

## 2014-04-03 DIAGNOSIS — M1710 Unilateral primary osteoarthritis, unspecified knee: Secondary | ICD-10-CM | POA: Diagnosis not present

## 2014-04-03 DIAGNOSIS — IMO0001 Reserved for inherently not codable concepts without codable children: Secondary | ICD-10-CM | POA: Diagnosis not present

## 2014-04-03 DIAGNOSIS — Z471 Aftercare following joint replacement surgery: Secondary | ICD-10-CM | POA: Diagnosis not present

## 2014-04-03 DIAGNOSIS — Q615 Medullary cystic kidney: Secondary | ICD-10-CM | POA: Diagnosis not present

## 2014-04-06 DIAGNOSIS — Q615 Medullary cystic kidney: Secondary | ICD-10-CM | POA: Diagnosis not present

## 2014-04-06 DIAGNOSIS — M1710 Unilateral primary osteoarthritis, unspecified knee: Secondary | ICD-10-CM | POA: Diagnosis not present

## 2014-04-06 DIAGNOSIS — I129 Hypertensive chronic kidney disease with stage 1 through stage 4 chronic kidney disease, or unspecified chronic kidney disease: Secondary | ICD-10-CM | POA: Diagnosis not present

## 2014-04-06 DIAGNOSIS — IMO0001 Reserved for inherently not codable concepts without codable children: Secondary | ICD-10-CM | POA: Diagnosis not present

## 2014-04-06 DIAGNOSIS — Z471 Aftercare following joint replacement surgery: Secondary | ICD-10-CM | POA: Diagnosis not present

## 2014-04-06 DIAGNOSIS — G8929 Other chronic pain: Secondary | ICD-10-CM | POA: Diagnosis not present

## 2014-04-10 DIAGNOSIS — I129 Hypertensive chronic kidney disease with stage 1 through stage 4 chronic kidney disease, or unspecified chronic kidney disease: Secondary | ICD-10-CM | POA: Diagnosis not present

## 2014-04-10 DIAGNOSIS — IMO0001 Reserved for inherently not codable concepts without codable children: Secondary | ICD-10-CM | POA: Diagnosis not present

## 2014-04-10 DIAGNOSIS — M1710 Unilateral primary osteoarthritis, unspecified knee: Secondary | ICD-10-CM | POA: Diagnosis not present

## 2014-04-10 DIAGNOSIS — Z471 Aftercare following joint replacement surgery: Secondary | ICD-10-CM | POA: Diagnosis not present

## 2014-04-10 DIAGNOSIS — Q615 Medullary cystic kidney: Secondary | ICD-10-CM | POA: Diagnosis not present

## 2014-04-10 DIAGNOSIS — G8929 Other chronic pain: Secondary | ICD-10-CM | POA: Diagnosis not present

## 2014-04-13 DIAGNOSIS — Z96659 Presence of unspecified artificial knee joint: Secondary | ICD-10-CM | POA: Diagnosis not present

## 2014-04-13 DIAGNOSIS — Z471 Aftercare following joint replacement surgery: Secondary | ICD-10-CM | POA: Diagnosis not present

## 2014-04-16 DIAGNOSIS — Z471 Aftercare following joint replacement surgery: Secondary | ICD-10-CM | POA: Diagnosis not present

## 2014-04-16 DIAGNOSIS — M1710 Unilateral primary osteoarthritis, unspecified knee: Secondary | ICD-10-CM | POA: Diagnosis not present

## 2014-04-16 DIAGNOSIS — I129 Hypertensive chronic kidney disease with stage 1 through stage 4 chronic kidney disease, or unspecified chronic kidney disease: Secondary | ICD-10-CM | POA: Diagnosis not present

## 2014-04-16 DIAGNOSIS — G8929 Other chronic pain: Secondary | ICD-10-CM | POA: Diagnosis not present

## 2014-04-16 DIAGNOSIS — Q615 Medullary cystic kidney: Secondary | ICD-10-CM | POA: Diagnosis not present

## 2014-04-16 DIAGNOSIS — IMO0001 Reserved for inherently not codable concepts without codable children: Secondary | ICD-10-CM | POA: Diagnosis not present

## 2014-04-17 DIAGNOSIS — M1710 Unilateral primary osteoarthritis, unspecified knee: Secondary | ICD-10-CM | POA: Diagnosis not present

## 2014-04-17 DIAGNOSIS — G8929 Other chronic pain: Secondary | ICD-10-CM | POA: Diagnosis not present

## 2014-04-17 DIAGNOSIS — I129 Hypertensive chronic kidney disease with stage 1 through stage 4 chronic kidney disease, or unspecified chronic kidney disease: Secondary | ICD-10-CM | POA: Diagnosis not present

## 2014-04-17 DIAGNOSIS — Q615 Medullary cystic kidney: Secondary | ICD-10-CM | POA: Diagnosis not present

## 2014-04-17 DIAGNOSIS — IMO0001 Reserved for inherently not codable concepts without codable children: Secondary | ICD-10-CM | POA: Diagnosis not present

## 2014-04-17 DIAGNOSIS — Z471 Aftercare following joint replacement surgery: Secondary | ICD-10-CM | POA: Diagnosis not present

## 2014-04-20 DIAGNOSIS — IMO0001 Reserved for inherently not codable concepts without codable children: Secondary | ICD-10-CM | POA: Diagnosis not present

## 2014-04-20 DIAGNOSIS — M1710 Unilateral primary osteoarthritis, unspecified knee: Secondary | ICD-10-CM | POA: Diagnosis not present

## 2014-04-20 DIAGNOSIS — G8929 Other chronic pain: Secondary | ICD-10-CM | POA: Diagnosis not present

## 2014-04-20 DIAGNOSIS — Q615 Medullary cystic kidney: Secondary | ICD-10-CM | POA: Diagnosis not present

## 2014-04-20 DIAGNOSIS — I129 Hypertensive chronic kidney disease with stage 1 through stage 4 chronic kidney disease, or unspecified chronic kidney disease: Secondary | ICD-10-CM | POA: Diagnosis not present

## 2014-04-20 DIAGNOSIS — Z471 Aftercare following joint replacement surgery: Secondary | ICD-10-CM | POA: Diagnosis not present

## 2014-04-21 DIAGNOSIS — I129 Hypertensive chronic kidney disease with stage 1 through stage 4 chronic kidney disease, or unspecified chronic kidney disease: Secondary | ICD-10-CM | POA: Diagnosis not present

## 2014-04-21 DIAGNOSIS — IMO0001 Reserved for inherently not codable concepts without codable children: Secondary | ICD-10-CM | POA: Diagnosis not present

## 2014-04-21 DIAGNOSIS — Q615 Medullary cystic kidney: Secondary | ICD-10-CM | POA: Diagnosis not present

## 2014-04-21 DIAGNOSIS — G8929 Other chronic pain: Secondary | ICD-10-CM | POA: Diagnosis not present

## 2014-04-21 DIAGNOSIS — M1710 Unilateral primary osteoarthritis, unspecified knee: Secondary | ICD-10-CM | POA: Diagnosis not present

## 2014-04-21 DIAGNOSIS — Z471 Aftercare following joint replacement surgery: Secondary | ICD-10-CM | POA: Diagnosis not present

## 2014-04-22 DIAGNOSIS — I129 Hypertensive chronic kidney disease with stage 1 through stage 4 chronic kidney disease, or unspecified chronic kidney disease: Secondary | ICD-10-CM | POA: Diagnosis not present

## 2014-04-22 DIAGNOSIS — Z79899 Other long term (current) drug therapy: Secondary | ICD-10-CM | POA: Diagnosis not present

## 2014-04-22 DIAGNOSIS — G894 Chronic pain syndrome: Secondary | ICD-10-CM | POA: Diagnosis not present

## 2014-05-05 DIAGNOSIS — M6281 Muscle weakness (generalized): Secondary | ICD-10-CM | POA: Diagnosis not present

## 2014-05-05 DIAGNOSIS — M1710 Unilateral primary osteoarthritis, unspecified knee: Secondary | ICD-10-CM | POA: Diagnosis not present

## 2014-05-05 DIAGNOSIS — R269 Unspecified abnormalities of gait and mobility: Secondary | ICD-10-CM | POA: Diagnosis not present

## 2014-05-05 DIAGNOSIS — M25669 Stiffness of unspecified knee, not elsewhere classified: Secondary | ICD-10-CM | POA: Diagnosis not present

## 2014-05-06 DIAGNOSIS — G894 Chronic pain syndrome: Secondary | ICD-10-CM | POA: Diagnosis not present

## 2014-05-07 DIAGNOSIS — M1710 Unilateral primary osteoarthritis, unspecified knee: Secondary | ICD-10-CM | POA: Diagnosis not present

## 2014-05-07 DIAGNOSIS — M6281 Muscle weakness (generalized): Secondary | ICD-10-CM | POA: Diagnosis not present

## 2014-05-07 DIAGNOSIS — M25669 Stiffness of unspecified knee, not elsewhere classified: Secondary | ICD-10-CM | POA: Diagnosis not present

## 2014-05-07 DIAGNOSIS — R269 Unspecified abnormalities of gait and mobility: Secondary | ICD-10-CM | POA: Diagnosis not present

## 2014-05-12 DIAGNOSIS — M25669 Stiffness of unspecified knee, not elsewhere classified: Secondary | ICD-10-CM | POA: Diagnosis not present

## 2014-05-12 DIAGNOSIS — M1710 Unilateral primary osteoarthritis, unspecified knee: Secondary | ICD-10-CM | POA: Diagnosis not present

## 2014-05-12 DIAGNOSIS — R269 Unspecified abnormalities of gait and mobility: Secondary | ICD-10-CM | POA: Diagnosis not present

## 2014-05-12 DIAGNOSIS — M6281 Muscle weakness (generalized): Secondary | ICD-10-CM | POA: Diagnosis not present

## 2014-05-14 DIAGNOSIS — R269 Unspecified abnormalities of gait and mobility: Secondary | ICD-10-CM | POA: Diagnosis not present

## 2014-05-14 DIAGNOSIS — M6281 Muscle weakness (generalized): Secondary | ICD-10-CM | POA: Diagnosis not present

## 2014-05-14 DIAGNOSIS — M25669 Stiffness of unspecified knee, not elsewhere classified: Secondary | ICD-10-CM | POA: Diagnosis not present

## 2014-05-14 DIAGNOSIS — M1710 Unilateral primary osteoarthritis, unspecified knee: Secondary | ICD-10-CM | POA: Diagnosis not present

## 2014-05-20 DIAGNOSIS — M1710 Unilateral primary osteoarthritis, unspecified knee: Secondary | ICD-10-CM | POA: Diagnosis not present

## 2014-05-20 DIAGNOSIS — M6281 Muscle weakness (generalized): Secondary | ICD-10-CM | POA: Diagnosis not present

## 2014-05-20 DIAGNOSIS — R269 Unspecified abnormalities of gait and mobility: Secondary | ICD-10-CM | POA: Diagnosis not present

## 2014-05-20 DIAGNOSIS — M25669 Stiffness of unspecified knee, not elsewhere classified: Secondary | ICD-10-CM | POA: Diagnosis not present

## 2014-05-21 DIAGNOSIS — M6281 Muscle weakness (generalized): Secondary | ICD-10-CM | POA: Diagnosis not present

## 2014-05-21 DIAGNOSIS — M1710 Unilateral primary osteoarthritis, unspecified knee: Secondary | ICD-10-CM | POA: Diagnosis not present

## 2014-05-21 DIAGNOSIS — M25669 Stiffness of unspecified knee, not elsewhere classified: Secondary | ICD-10-CM | POA: Diagnosis not present

## 2014-05-21 DIAGNOSIS — R269 Unspecified abnormalities of gait and mobility: Secondary | ICD-10-CM | POA: Diagnosis not present

## 2014-05-28 DIAGNOSIS — R269 Unspecified abnormalities of gait and mobility: Secondary | ICD-10-CM | POA: Diagnosis not present

## 2014-05-28 DIAGNOSIS — M6281 Muscle weakness (generalized): Secondary | ICD-10-CM | POA: Diagnosis not present

## 2014-05-28 DIAGNOSIS — M1710 Unilateral primary osteoarthritis, unspecified knee: Secondary | ICD-10-CM | POA: Diagnosis not present

## 2014-05-28 DIAGNOSIS — M25669 Stiffness of unspecified knee, not elsewhere classified: Secondary | ICD-10-CM | POA: Diagnosis not present

## 2014-06-03 DIAGNOSIS — M109 Gout, unspecified: Secondary | ICD-10-CM | POA: Diagnosis not present

## 2014-06-03 DIAGNOSIS — G894 Chronic pain syndrome: Secondary | ICD-10-CM | POA: Diagnosis not present

## 2014-06-03 DIAGNOSIS — N183 Chronic kidney disease, stage 3 unspecified: Secondary | ICD-10-CM | POA: Diagnosis not present

## 2014-06-10 DIAGNOSIS — M67919 Unspecified disorder of synovium and tendon, unspecified shoulder: Secondary | ICD-10-CM | POA: Diagnosis not present

## 2014-06-15 ENCOUNTER — Ambulatory Visit: Payer: Self-pay | Admitting: Pain Medicine

## 2014-06-15 DIAGNOSIS — M25519 Pain in unspecified shoulder: Secondary | ICD-10-CM | POA: Diagnosis not present

## 2014-06-15 DIAGNOSIS — N183 Chronic kidney disease, stage 3 unspecified: Secondary | ICD-10-CM | POA: Diagnosis not present

## 2014-06-15 DIAGNOSIS — E785 Hyperlipidemia, unspecified: Secondary | ICD-10-CM | POA: Diagnosis not present

## 2014-06-15 DIAGNOSIS — R7301 Impaired fasting glucose: Secondary | ICD-10-CM | POA: Diagnosis not present

## 2014-06-15 DIAGNOSIS — D631 Anemia in chronic kidney disease: Secondary | ICD-10-CM | POA: Diagnosis not present

## 2014-06-15 DIAGNOSIS — Z5181 Encounter for therapeutic drug level monitoring: Secondary | ICD-10-CM | POA: Diagnosis not present

## 2014-06-15 DIAGNOSIS — G603 Idiopathic progressive neuropathy: Secondary | ICD-10-CM | POA: Diagnosis not present

## 2014-06-15 DIAGNOSIS — N039 Chronic nephritic syndrome with unspecified morphologic changes: Secondary | ICD-10-CM | POA: Diagnosis not present

## 2014-06-15 DIAGNOSIS — H269 Unspecified cataract: Secondary | ICD-10-CM | POA: Diagnosis not present

## 2014-06-15 DIAGNOSIS — G8929 Other chronic pain: Secondary | ICD-10-CM | POA: Diagnosis not present

## 2014-06-15 DIAGNOSIS — M25569 Pain in unspecified knee: Secondary | ICD-10-CM | POA: Diagnosis not present

## 2014-06-15 DIAGNOSIS — G894 Chronic pain syndrome: Secondary | ICD-10-CM | POA: Diagnosis not present

## 2014-06-15 DIAGNOSIS — I129 Hypertensive chronic kidney disease with stage 1 through stage 4 chronic kidney disease, or unspecified chronic kidney disease: Secondary | ICD-10-CM | POA: Diagnosis not present

## 2014-06-15 DIAGNOSIS — Z79899 Other long term (current) drug therapy: Secondary | ICD-10-CM | POA: Diagnosis not present

## 2014-06-15 DIAGNOSIS — F17200 Nicotine dependence, unspecified, uncomplicated: Secondary | ICD-10-CM | POA: Diagnosis not present

## 2014-06-15 DIAGNOSIS — IMO0002 Reserved for concepts with insufficient information to code with codable children: Secondary | ICD-10-CM | POA: Diagnosis not present

## 2014-06-15 DIAGNOSIS — Z96659 Presence of unspecified artificial knee joint: Secondary | ICD-10-CM | POA: Diagnosis not present

## 2014-06-15 DIAGNOSIS — Q615 Medullary cystic kidney: Secondary | ICD-10-CM | POA: Diagnosis not present

## 2014-06-15 DIAGNOSIS — K449 Diaphragmatic hernia without obstruction or gangrene: Secondary | ICD-10-CM | POA: Diagnosis not present

## 2014-07-01 DIAGNOSIS — R209 Unspecified disturbances of skin sensation: Secondary | ICD-10-CM | POA: Diagnosis not present

## 2014-07-01 DIAGNOSIS — M79609 Pain in unspecified limb: Secondary | ICD-10-CM | POA: Diagnosis not present

## 2014-07-06 DIAGNOSIS — E785 Hyperlipidemia, unspecified: Secondary | ICD-10-CM | POA: Diagnosis not present

## 2014-07-06 DIAGNOSIS — G894 Chronic pain syndrome: Secondary | ICD-10-CM | POA: Diagnosis not present

## 2014-07-28 DIAGNOSIS — F4542 Pain disorder with related psychological factors: Secondary | ICD-10-CM | POA: Diagnosis not present

## 2014-08-07 DIAGNOSIS — G894 Chronic pain syndrome: Secondary | ICD-10-CM | POA: Diagnosis not present

## 2014-08-07 DIAGNOSIS — F17200 Nicotine dependence, unspecified, uncomplicated: Secondary | ICD-10-CM | POA: Diagnosis not present

## 2014-09-04 DIAGNOSIS — M25571 Pain in right ankle and joints of right foot: Secondary | ICD-10-CM | POA: Diagnosis not present

## 2014-09-04 DIAGNOSIS — M13862 Other specified arthritis, left knee: Secondary | ICD-10-CM | POA: Diagnosis not present

## 2014-09-04 DIAGNOSIS — G894 Chronic pain syndrome: Secondary | ICD-10-CM | POA: Diagnosis not present

## 2014-09-04 DIAGNOSIS — Z79891 Long term (current) use of opiate analgesic: Secondary | ICD-10-CM | POA: Diagnosis not present

## 2014-09-04 DIAGNOSIS — M25569 Pain in unspecified knee: Secondary | ICD-10-CM | POA: Diagnosis not present

## 2014-10-07 DIAGNOSIS — M25569 Pain in unspecified knee: Secondary | ICD-10-CM | POA: Diagnosis not present

## 2014-10-14 ENCOUNTER — Encounter (HOSPITAL_COMMUNITY): Payer: Self-pay | Admitting: Physician Assistant

## 2014-10-14 DIAGNOSIS — M1711 Unilateral primary osteoarthritis, right knee: Secondary | ICD-10-CM | POA: Diagnosis present

## 2014-10-14 NOTE — H&P (Signed)
TOTAL KNEE ADMISSION H&P  Patient is being admitted for right total knee arthroplasty.  Subjective:  Chief Complaint:right knee pain.  HPI: Nathan Ramos, 43 y.o. male, has a history of pain and functional disability in the right knee due to arthritis and has failed non-surgical conservative treatments for greater than 12 weeks to includeNSAID's and/or analgesics, corticosteriod injections, viscosupplementation injections, flexibility and strengthening excercises, supervised PT with diminished ADL's post treatment, use of assistive devices, weight reduction as appropriate and activity modification.  Onset of symptoms was gradual, starting 10 years ago with gradually worsening course since that time. The patient noted prior procedures on the knee to include  arthroscopy and menisectomy on the right knee(s).  Patient currently rates pain in the right knee(s) at 10 out of 10 with activity. Patient has night pain, worsening of pain with activity and weight bearing, pain that interferes with activities of daily living, pain with passive range of motion, crepitus and joint swelling.  Patient has evidence of subchondral sclerosis, periarticular osteophytes and joint space narrowing by imaging studies.  There is no active infection.  Patient Active Problem List   Diagnosis Date Noted  . Primary localized osteoarthritis of right knee   . Gout due to renal impairment 03/30/2014  . DJD (degenerative joint disease) of knee 03/30/2014  . Medullary cystic disease of the kidney   . Left knee DJD   . Chronic pain   . Hypertension    Past Medical History  Diagnosis Date  . 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 Left knee DJD   . Gout   . Chronic pain     [name] CFNP at [facility]  . Hypertension   . Smoker   . CKD (chronic kidney disease)     stage III 03/2014 (Dr. Mosetta Pigeon)  . Primary localized osteoarthritis of right knee     Past  Surgical History  Procedure Laterality Date  . Carpal tunnel release Left   . Umbilical hernia repair  2010  . Cataract extraction w/ intraocular lens implant Left 2008  . Cataract extraction w/ intraocular lens implant Right 2008  . Hernia repair    . Total knee arthroplasty Left 03/30/2014    Procedure: TOTAL KNEE ARTHROPLASTY;  Surgeon: Nilda Simmer, MD;  Location: [facility] OR;  Service: Orthopedics;  Laterality: Left;    No current facility-administered medications for this encounter. Current outpatient prescriptions: acetaminophen (TYLENOL) 325 MG tablet, Take 2 tablets (650 mg total) by mouth every 6 (six) hours as needed for mild pain (or Fever >/= 101)., Disp: , Rfl: ;  amLODipine (NORVASC) 10 MG tablet, Take 10 mg by mouth daily., Disp: , Rfl: ;  enoxaparin (LOVENOX) 30 MG/0.3ML injection, Inject 0.3 mLs (30 mg total) into the skin daily., Disp: 30 Syringe, Rfl: 0 Febuxostat (ULORIC) 80 MG TABS, Take 1 tablet by mouth daily., Disp: , Rfl: ;  furosemide (LASIX) 40 MG tablet, Take 40 mg by mouth daily. , Disp: , Rfl: ;  losartan (COZAAR) 50 MG tablet, Take 50 mg by mouth daily., Disp: , Rfl: ;  morphine (MS CONTIN) 60 MG 12 hr tablet, Take 1 tablet (60 mg total) by mouth every 12 (twelve) hours., Disp: 30 tablet, Rfl: 0 oxyCODONE (ROXICODONE) 15 MG immediate release tablet, Take 1 tablet (15 mg total) by mouth every 3 (three) hours as needed for breakthrough pain., Disp: 100 tablet, Rfl: 0;  polyethylene glycol (MIRALAX / GLYCOLAX)  packet, Take 17 g by mouth 2 (two) times daily., Disp: 30 each, Rfl: 0 Allergies  Allergen Reactions  . Nsaids Other (See Comments)    impaired renal function    History  Substance Use Topics  . Smoking status: Current Every Day Smoker -- 0.50 packs/day for 22 years    Types: Cigarettes  . Smokeless tobacco: Not on file  . Alcohol Use: No    Family History  Problem Relation Age of Onset  . COPD Mother   . Stroke Father [id number]  . Kidney disease Mother       Review of Systems  Constitutional: Negative.   HENT: Negative.   Eyes: Negative.   Respiratory: Negative.   Cardiovascular: Negative.   Gastrointestinal: Negative.   Genitourinary: Negative.   Musculoskeletal: Positive for joint pain.  Skin: Negative.   Neurological: Negative.   Endo/Heme/Allergies: Negative.   Psychiatric/Behavioral: Negative.     Objective:  Physical Exam  Constitutional: He is oriented to person, place, and time. He appears well-developed and well-nourished.  HENT:  Head: Normocephalic and atraumatic.  Eyes: Conjunctivae and EOM are normal. Pupils are equal, round, and reactive to light.  Neck: Neck supple.  Cardiovascular: Normal rate and regular rhythm.   Respiratory: Effort normal.  GI: Soft.  Genitourinary:  Not pertinent to current symptomatology therefore not examined.  Musculoskeletal:  Examination of his left knee reveals the incision is well healed there is minimal swelling mild pain full range of motion knee is stable. Exam of the right knee reveals 1+ crepitation 1+ synovitis range of motion -5 to 125 degrees knee is stable with diffuse pain and normal patella tracking with a gouty tophus   Neurological: He is alert and oriented to person, place, and time.  Skin: Skin is warm and dry.  Psychiatric: He has a normal mood and affect. His behavior is normal.    Vital signs in last 24 hours: Temp:  [98.2 F (36.8 C)] 98.2 F (36.8 C) (12/02 1500) Pulse Rate:  [88] 88 (12/02 1500) BP: (134)/(78) 134/78 mmHg (12/02 1500) SpO2:  [96 %] 96 % (12/02 1500) Weight:  [106.142 kg (234 lb)] 106.142 kg (234 lb) (12/02 1500)  Labs:   Estimated body mass index is 36.64 kg/(m^2) as calculated from the following:   Height as of this encounter: 5\' 7"  (1.702 m).   Weight as of this encounter: 106.142 kg (234 lb).   Imaging Review Plain radiographs demonstrate severe degenerative joint disease of the right knee(s). The overall alignment issignificant  varus. The bone quality appears to be good for age and reported activity level.  Assessment/Plan:  End stage arthritis, right knee  Principal Problem:   Primary localized osteoarthritis of right knee Active Problems:   Medullary cystic disease of the kidney   Chronic pain   Hypertension   Gout due to renal impairment   The patient history, physical examination, clinical judgment of the provider and imaging studies are consistent with end stage degenerative joint disease of the right knee(s) and total knee arthroplasty is deemed medically necessary. The treatment options including medical management, injection therapy arthroscopy and arthroplasty were discussed at length. The risks and benefits of total knee arthroplasty were presented and reviewed. The risks due to aseptic loosening, infection, stiffness, patella tracking problems, thromboembolic complications and other imponderables were discussed. The patient acknowledged the explanation, agreed to proceed with the plan and consent was signed. Patient is being admitted for inpatient treatment for surgery, pain control, PT, OT, prophylactic antibiotics, VTE  prophylaxis, progressive ambulation and ADL's and discharge planning. The patient is planning to be discharged home with home health services   Samoria Fedorko A. Gwinda PasseShepperson, PA-C Physician Assistant [facility] [phone]  10/14/2014, 3:40 PM

## 2014-10-15 NOTE — Pre-Procedure Instructions (Signed)
Nathan GroomsStacey L Ramos  10/15/2014   Your procedure is scheduled on: Monday, October 26, 2014  [facility].  Call this number if you have problems the morning of surgery: [phone]   Remember:   Do not eat food or drink liquids after midnight Sunday, October 25, 2014   Take these medicines the morning of surgery with A SIP OF WATER: amLODipine (NORVASC, and Pain if needed. Stop taking Aspirin, vitamins, and herbal medications. Do not take any NSAIDs ie: Ibuprofen, Advil, Naproxen or any medication containing Aspirin; stop 5 days prior to procedure ( Wednesday, October 21, 2014).   Do not wear jewelry.  Do not wear lotions, powders, or perfumes. You may not  wear deodorant.  Men may shave face and neck.  Do not bring valuables to the hospital.  [facility] is not responsible for any belongings or valuables.               Contacts, dentures or bridgework may not be worn into surgery.  Leave suitcase in the car. After surgery it may be brought to your room.  For patients admitted to the hospital, discharge time is determined by your treatment team.               Patients discharged the day of surgery will not be allowed to drive  home.    Special Instructions:  Special Instructions:Special Instructions: [facility] - Preparing for Surgery  Before surgery, you can play an important role.  Because skin is not sterile, your skin needs to be as free of germs as possible.  You can reduce the number of germs on you skin by washing with CHG (chlorahexidine gluconate) soap before surgery.  CHG is an antiseptic cleaner which kills germs and bonds with the skin to continue killing germs even after washing.  Please DO NOT use if you have an allergy to CHG or antibacterial soaps.  If your skin becomes reddened/irritated stop using the CHG and inform your nurse when you arrive at Short Stay.  Do not shave (including legs and underarms) for at least 48 hours prior  to the first CHG shower.  You may shave your face.  Please follow these instructions carefully:   1.  Shower with CHG Soap the night before surgery and the morning of Surgery.  2.  If you choose to wash your hair, wash your hair first as usual with your normal shampoo.  3.  After you shampoo, rinse your hair and body thoroughly to remove the Shampoo.  4.  Use CHG as you would any other liquid soap.  You can apply chg directly  to the skin and wash gently with scrungie or a clean washcloth.  5.  Apply the CHG Soap to your body ONLY FROM THE NECK DOWN.  Do not use on open wounds or open sores.  Avoid contact with your eyes, ears, mouth and genitals (private parts).  Wash genitals (private parts) with your normal soap.  6.  Wash thoroughly, paying special attention to the area where your surgery will be performed.  7.  Thoroughly rinse your body with warm water from the neck down.  8.  DO NOT shower/wash with your normal soap after using and rinsing off the CHG Soap.  9.  Pat yourself dry with a clean towel.            10 .  Wear clean pajamas.  11.  Place clean sheets on your bed the night of your first shower and do not sleep with pets.  Day of Surgery  Do not apply any lotions/deodorants the morning of surgery.  Please wear clean clothes to the hospital/surgery center.   Please read over the following fact sheets that you were given: Pain Booklet, Coughing and Deep Breathing, Blood Transfusion Information, MRSA Information and Surgical Site Infection Prevention

## 2014-10-16 ENCOUNTER — Encounter (HOSPITAL_COMMUNITY)
Admission: RE | Admit: 2014-10-16 | Discharge: 2014-10-16 | Disposition: A | Discharge status: Home / Self Care | Payer: Medicare Other | Source: Ambulatory Visit | Attending: Orthopedic Surgery | Admitting: Orthopedic Surgery

## 2014-10-16 ENCOUNTER — Encounter (HOSPITAL_COMMUNITY): Payer: Self-pay

## 2014-10-16 DIAGNOSIS — Z01818 Encounter for other preprocedural examination: Secondary | ICD-10-CM | POA: Diagnosis not present

## 2014-10-16 DIAGNOSIS — Z6834 Body mass index (BMI) 34.0-34.9, adult: Secondary | ICD-10-CM | POA: Diagnosis not present

## 2014-10-16 DIAGNOSIS — Q615 Medullary cystic kidney: Secondary | ICD-10-CM | POA: Insufficient documentation

## 2014-10-16 DIAGNOSIS — I129 Hypertensive chronic kidney disease with stage 1 through stage 4 chronic kidney disease, or unspecified chronic kidney disease: Secondary | ICD-10-CM | POA: Insufficient documentation

## 2014-10-16 DIAGNOSIS — N183 Chronic kidney disease, stage 3 (moderate): Secondary | ICD-10-CM | POA: Insufficient documentation

## 2014-10-16 DIAGNOSIS — Z720 Tobacco use: Secondary | ICD-10-CM | POA: Insufficient documentation

## 2014-10-16 DIAGNOSIS — M109 Gout, unspecified: Secondary | ICD-10-CM | POA: Diagnosis not present

## 2014-10-16 LAB — COMPREHENSIVE METABOLIC PANEL
ALT: 20 U/L (ref 0–53)
AST: 42 U/L — ABNORMAL HIGH (ref 0–37)
Albumin: 4.2 g/dL (ref 3.5–5.2)
Alkaline Phosphatase: 87 U/L (ref 39–117)
Anion gap: 15 (ref 5–15)
BUN: 60 mg/dL — ABNORMAL HIGH (ref 6–23)
CO2: 23 mEq/L (ref 19–32)
Calcium: 9.8 mg/dL (ref 8.4–10.5)
Chloride: 102 mEq/L (ref 96–112)
Creatinine, Ser: 2.41 mg/dL — ABNORMAL HIGH (ref 0.50–1.35)
GFR calc Af Amer: 37 mL/min — ABNORMAL LOW (ref >90)
GFR calc non Af Amer: 32 mL/min — ABNORMAL LOW (ref >90)
Glucose, Bld: 99 mg/dL (ref 70–99)
Potassium: 4.8 mEq/L (ref 3.7–5.3)
Sodium: 140 mEq/L (ref 137–147)
Total Bilirubin: 0.3 mg/dL (ref 0.3–1.2)
Total Protein: 8 g/dL (ref 6.0–8.3)

## 2014-10-16 LAB — CBC WITH DIFFERENTIAL/PLATELET
Basophils Absolute: 0.1 10*3/uL (ref 0.0–0.1)
Basophils Relative: 1 % (ref 0–1)
Eosinophils Absolute: 0.4 10*3/uL (ref 0.0–0.7)
Eosinophils Relative: 4 % (ref 0–5)
HCT: 34.8 % — ABNORMAL LOW (ref 39.0–52.0)
Hemoglobin: 11.5 g/dL — ABNORMAL LOW (ref 13.0–17.0)
Lymphocytes Relative: 24 % (ref 12–46)
Lymphs Abs: 2.1 10*3/uL (ref 0.7–4.0)
MCH: 28.1 pg (ref 26.0–34.0)
MCHC: 33 g/dL (ref 30.0–36.0)
MCV: 85.1 fL (ref 78.0–100.0)
Monocytes Absolute: 0.8 10*3/uL (ref 0.1–1.0)
Monocytes Relative: 9 % (ref 3–12)
Neutro Abs: 5.6 10*3/uL (ref 1.7–7.7)
Neutrophils Relative %: 62 % (ref 43–77)
Platelets: 263 10*3/uL (ref 150–400)
RBC: 4.09 MIL/uL — ABNORMAL LOW (ref 4.22–5.81)
RDW: 13.3 % (ref 11.5–15.5)
WBC: 9 10*3/uL (ref 4.0–10.5)

## 2014-10-16 LAB — TYPE AND SCREEN
ABO/RH(D): O POS
Antibody Screen: NEGATIVE

## 2014-10-16 LAB — PROTIME-INR
INR: 1.02 (ref 0.00–1.49)
Prothrombin Time: 13.5 seconds (ref 11.6–15.2)

## 2014-10-16 LAB — URINALYSIS, ROUTINE W REFLEX MICROSCOPIC
Bilirubin Urine: NEGATIVE
Glucose, UA: NEGATIVE mg/dL
Hgb urine dipstick: NEGATIVE
Ketones, ur: NEGATIVE mg/dL
Leukocytes, UA: NEGATIVE
Nitrite: NEGATIVE
Protein, ur: NEGATIVE mg/dL
Specific Gravity, Urine: 1.008 (ref 1.005–1.030)
Urobilinogen, UA: 0.2 mg/dL (ref 0.0–1.0)
pH: 5 (ref 5.0–8.0)

## 2014-10-16 LAB — SURGICAL PCR SCREEN
MRSA, PCR: NEGATIVE
Staphylococcus aureus: NEGATIVE

## 2014-10-16 LAB — APTT: aPTT: 37 seconds (ref 24–37)

## 2014-10-16 MED ORDER — CHLORHEXIDINE GLUCONATE 4 % EX LIQD: 60.0000 mL | Freq: Once | CUTANEOUS | Status: DC

## 2014-10-17 LAB — URINE CULTURE: Colony Count: 1000

## 2014-10-19 NOTE — Progress Notes (Signed)
Anesthesia chart review: Patient is a 43 year old male scheduled for right TKA on 10/26/14 by Dr. Salvatore Marvelobert [facility].  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 Mosetta PigeonHarmeet Singh), UHR '10, cataract extraction, left TKA 03/30/14. BMI is 34.47 consistent with obesity. PCP is [facility] Baruch [facility]out[facility]Melinda [facility], last visit 10/07/14 who was aware of upcoming orthopedic surgery in December.    EKG on 03/20/14 ([facility]) showed NSR.   Chest x-ray on 03/23/14 showed no acute cardiopulmonary abnormality seen.  Preoperative labs noted. BUN/Cr 60/2.41, a little up from 55/2.27 on 03/31/14.  H/H 11.5/34.8. PT/PTT WNL. Urine culture showed insignificant growth. I reviewed 03/11/14 nephrology records prior to his TKA in 03/2014 (scanned under Media tab).  Cr has primarily been in the 2-2.3 range since 2011. He will need close monitoring of his renal function in the post-operative period.  Velna Ochsllison Alder Murri, PA-C [facility]Anesthesiology Phone [phone] 10/19/2014 11:46 AM

## 2014-10-25 MED ORDER — CEFAZOLIN SODIUM-DEXTROSE 2-3 GM-% IV SOLR
2.0000 g | INTRAVENOUS | Status: AC
  Administered 2014-10-26: 2 g via INTRAVENOUS
  Filled 2014-10-25: qty 50

## 2014-10-26 ENCOUNTER — Encounter (HOSPITAL_COMMUNITY)
Admission: RE | Disposition: A | Discharge status: Home / Self Care | Payer: Self-pay | Source: Ambulatory Visit | Attending: Orthopedic Surgery

## 2014-10-26 ENCOUNTER — Encounter (HOSPITAL_COMMUNITY): Payer: Self-pay | Admitting: *Deleted

## 2014-10-26 ENCOUNTER — Inpatient Hospital Stay (HOSPITAL_COMMUNITY): Payer: Medicare Other | Admitting: Anesthesiology

## 2014-10-26 ENCOUNTER — Inpatient Hospital Stay (HOSPITAL_COMMUNITY): Payer: Medicare Other | Admitting: Vascular Surgery

## 2014-10-26 ENCOUNTER — Inpatient Hospital Stay (HOSPITAL_COMMUNITY)
Admission: RE | Admit: 2014-10-26 | Discharge: 2014-10-27 | DRG: 470 | Disposition: A | Discharge status: Home Health | Payer: Medicare Other | Source: Ambulatory Visit | Attending: Orthopedic Surgery | Admitting: Orthopedic Surgery

## 2014-10-26 DIAGNOSIS — Z96652 Presence of left artificial knee joint: Secondary | ICD-10-CM | POA: Diagnosis present

## 2014-10-26 DIAGNOSIS — Z888 Allergy status to other drugs, medicaments and biological substances status: Secondary | ICD-10-CM | POA: Diagnosis not present

## 2014-10-26 DIAGNOSIS — G8929 Other chronic pain: Secondary | ICD-10-CM | POA: Diagnosis present

## 2014-10-26 DIAGNOSIS — N183 Chronic kidney disease, stage 3 (moderate): Secondary | ICD-10-CM | POA: Diagnosis present

## 2014-10-26 DIAGNOSIS — F17210 Nicotine dependence, cigarettes, uncomplicated: Secondary | ICD-10-CM | POA: Diagnosis present

## 2014-10-26 DIAGNOSIS — Z9841 Cataract extraction status, right eye: Secondary | ICD-10-CM

## 2014-10-26 DIAGNOSIS — Z9842 Cataract extraction status, left eye: Secondary | ICD-10-CM | POA: Diagnosis not present

## 2014-10-26 DIAGNOSIS — M179 Osteoarthritis of knee, unspecified: Secondary | ICD-10-CM | POA: Diagnosis present

## 2014-10-26 DIAGNOSIS — M25561 Pain in right knee: Secondary | ICD-10-CM | POA: Diagnosis not present

## 2014-10-26 DIAGNOSIS — G8918 Other acute postprocedural pain: Secondary | ICD-10-CM | POA: Diagnosis not present

## 2014-10-26 DIAGNOSIS — Q615 Medullary cystic kidney: Secondary | ICD-10-CM

## 2014-10-26 DIAGNOSIS — I129 Hypertensive chronic kidney disease with stage 1 through stage 4 chronic kidney disease, or unspecified chronic kidney disease: Secondary | ICD-10-CM | POA: Diagnosis present

## 2014-10-26 DIAGNOSIS — M109 Gout, unspecified: Secondary | ICD-10-CM | POA: Diagnosis present

## 2014-10-26 DIAGNOSIS — Q619 Cystic kidney disease, unspecified: Secondary | ICD-10-CM

## 2014-10-26 DIAGNOSIS — M1711 Unilateral primary osteoarthritis, right knee: Principal | ICD-10-CM | POA: Diagnosis present

## 2014-10-26 DIAGNOSIS — M1030 Gout due to renal impairment, unspecified site: Secondary | ICD-10-CM | POA: Diagnosis present

## 2014-10-26 DIAGNOSIS — M1710 Unilateral primary osteoarthritis, unspecified knee: Secondary | ICD-10-CM | POA: Diagnosis present

## 2014-10-26 DIAGNOSIS — I10 Essential (primary) hypertension: Secondary | ICD-10-CM | POA: Diagnosis present

## 2014-10-26 HISTORY — DX: Unilateral primary osteoarthritis, right knee: M17.11

## 2014-10-26 HISTORY — PX: TOTAL KNEE ARTHROPLASTY: SHX125

## 2014-10-26 LAB — CREATININE, SERUM
Creatinine, Ser: 2.51 mg/dL — ABNORMAL HIGH (ref 0.50–1.35)
GFR calc Af Amer: 35 mL/min — ABNORMAL LOW
GFR calc non Af Amer: 30 mL/min — ABNORMAL LOW

## 2014-10-26 LAB — CBC
HCT: 31.3 % — ABNORMAL LOW (ref 39.0–52.0)
Hemoglobin: 10 g/dL — ABNORMAL LOW (ref 13.0–17.0)
MCH: 27 pg (ref 26.0–34.0)
MCHC: 31.9 g/dL (ref 30.0–36.0)
MCV: 84.4 fL (ref 78.0–100.0)
Platelets: 292 10*3/uL (ref 150–400)
RBC: 3.71 MIL/uL — ABNORMAL LOW (ref 4.22–5.81)
RDW: 13.2 % (ref 11.5–15.5)
WBC: 15.2 10*3/uL — ABNORMAL HIGH (ref 4.0–10.5)

## 2014-10-26 SURGERY — ARTHROPLASTY, KNEE, TOTAL
Anesthesia: General | Site: Knee | Laterality: Right

## 2014-10-26 MED ORDER — BUPIVACAINE IN DEXTROSE 0.75-8.25 % IT SOLN
INTRATHECAL | Status: DC | PRN
  Administered 2014-10-26: 2 mL via INTRATHECAL

## 2014-10-26 MED ORDER — MIDAZOLAM HCL 2 MG/2ML IJ SOLN
INTRAMUSCULAR | Status: AC
  Filled 2014-10-26: qty 2

## 2014-10-26 MED ORDER — FEBUXOSTAT 40 MG PO TABS
80.0000 mg | ORAL_TABLET | Freq: Every day | ORAL | Status: DC
  Administered 2014-10-26 – 2014-10-27 (×2): 80 mg via ORAL
  Filled 2014-10-26 (×2): qty 2

## 2014-10-26 MED ORDER — PROPOFOL 10 MG/ML IV BOLUS
INTRAVENOUS | Status: AC
  Filled 2014-10-26: qty 20

## 2014-10-26 MED ORDER — PROPOFOL INFUSION 10 MG/ML OPTIME
INTRAVENOUS | Status: DC | PRN
  Administered 2014-10-26: 100 ug/kg/min via INTRAVENOUS

## 2014-10-26 MED ORDER — ALUM & MAG HYDROXIDE-SIMETH 200-200-20 MG/5ML PO SUSP: 30.0000 mL | ORAL | Status: DC | PRN

## 2014-10-26 MED ORDER — PHENYLEPHRINE HCL 10 MG/ML IJ SOLN
INTRAMUSCULAR | Status: DC | PRN
  Administered 2014-10-26 (×2): 40 ug via INTRAVENOUS
  Administered 2014-10-26 (×3): 80 ug via INTRAVENOUS
  Administered 2014-10-26 (×2): 40 ug via INTRAVENOUS
  Administered 2014-10-26 (×2): 80 ug via INTRAVENOUS

## 2014-10-26 MED ORDER — LOSARTAN POTASSIUM 50 MG PO TABS
50.0000 mg | ORAL_TABLET | Freq: Every day | ORAL | Status: DC
Start: 2014-10-26 — End: 2014-10-27
  Administered 2014-10-27: 50 mg via ORAL
  Filled 2014-10-26 (×2): qty 1

## 2014-10-26 MED ORDER — DEXAMETHASONE SODIUM PHOSPHATE 10 MG/ML IJ SOLN
10.0000 mg | Freq: Three times a day (TID) | INTRAMUSCULAR | Status: DC
  Administered 2014-10-27: 10 mg via INTRAVENOUS
  Filled 2014-10-26 (×4): qty 1

## 2014-10-26 MED ORDER — MIDAZOLAM HCL 5 MG/5ML IJ SOLN
INTRAMUSCULAR | Status: DC | PRN
  Administered 2014-10-26: 2 mg via INTRAVENOUS

## 2014-10-26 MED ORDER — AMLODIPINE BESYLATE 10 MG PO TABS
10.0000 mg | ORAL_TABLET | Freq: Every day | ORAL | Status: DC
  Administered 2014-10-27: 10 mg via ORAL
  Filled 2014-10-26: qty 1

## 2014-10-26 MED ORDER — HYDROMORPHONE HCL 1 MG/ML IJ SOLN
INTRAMUSCULAR | Status: AC
  Filled 2014-10-26: qty 1

## 2014-10-26 MED ORDER — DIPHENHYDRAMINE HCL 12.5 MG/5ML PO ELIX: 12.5000 mg | ORAL_SOLUTION | ORAL | Status: DC | PRN

## 2014-10-26 MED ORDER — ONDANSETRON HCL 4 MG/2ML IJ SOLN: 4.0000 mg | Freq: Four times a day (QID) | INTRAMUSCULAR | Status: DC | PRN

## 2014-10-26 MED ORDER — FENTANYL CITRATE 0.05 MG/ML IJ SOLN
INTRAMUSCULAR | Status: DC | PRN
  Administered 2014-10-26: 50 ug via INTRAVENOUS

## 2014-10-26 MED ORDER — ACETAMINOPHEN 325 MG PO TABS: 650.0000 mg | ORAL_TABLET | Freq: Four times a day (QID) | ORAL | Status: DC | PRN

## 2014-10-26 MED ORDER — FENTANYL CITRATE 0.05 MG/ML IJ SOLN
INTRAMUSCULAR | Status: AC
  Filled 2014-10-26: qty 5

## 2014-10-26 MED ORDER — SODIUM CHLORIDE 0.9 % IR SOLN
Status: DC | PRN
  Administered 2014-10-26: 3000 mL

## 2014-10-26 MED ORDER — ONDANSETRON HCL 4 MG PO TABS: 4.0000 mg | ORAL_TABLET | Freq: Four times a day (QID) | ORAL | Status: DC | PRN

## 2014-10-26 MED ORDER — ROPIVACAINE HCL 5 MG/ML IJ SOLN
INTRAMUSCULAR | Status: DC | PRN
  Administered 2014-10-26: 20 mL via PERINEURAL

## 2014-10-26 MED ORDER — CHLORHEXIDINE GLUCONATE 4 % EX LIQD
60.0000 mL | Freq: Once | CUTANEOUS | Status: DC
  Filled 2014-10-26: qty 60

## 2014-10-26 MED ORDER — HYDROMORPHONE HCL 1 MG/ML IJ SOLN
1.0000 mg | INTRAMUSCULAR | Status: DC | PRN
  Administered 2014-10-26: 2 mg via INTRAVENOUS
  Administered 2014-10-27: 1 mg via INTRAVENOUS
  Filled 2014-10-26 (×2): qty 2

## 2014-10-26 MED ORDER — POVIDONE-IODINE 7.5 % EX SOLN: Freq: Once | CUTANEOUS | Status: DC

## 2014-10-26 MED ORDER — OXYCODONE HCL 5 MG PO TABS
ORAL_TABLET | ORAL | Status: AC
  Filled 2014-10-26: qty 2

## 2014-10-26 MED ORDER — HYDROMORPHONE HCL 1 MG/ML IJ SOLN
1.0000 mg | INTRAMUSCULAR | Status: DC | PRN
  Administered 2014-10-26: 1 mg via INTRAVENOUS
  Filled 2014-10-26: qty 1

## 2014-10-26 MED ORDER — OXYCODONE HCL 5 MG PO TABS
10.0000 mg | ORAL_TABLET | Freq: Once | ORAL | Status: AC | PRN
  Administered 2014-10-26: 10 mg via ORAL

## 2014-10-26 MED ORDER — MORPHINE SULFATE ER 15 MG PO TBCR
60.0000 mg | EXTENDED_RELEASE_TABLET | Freq: Two times a day (BID) | ORAL | Status: DC
  Administered 2014-10-26 – 2014-10-27 (×2): 60 mg via ORAL
  Filled 2014-10-26 (×2): qty 4

## 2014-10-26 MED ORDER — ENOXAPARIN SODIUM 30 MG/0.3ML ~~LOC~~ SOLN
30.0000 mg | SUBCUTANEOUS | Status: DC
  Administered 2014-10-27: 30 mg via SUBCUTANEOUS
  Filled 2014-10-26 (×2): qty 0.3

## 2014-10-26 MED ORDER — POLYETHYLENE GLYCOL 3350 17 G PO PACK
17.0000 g | PACK | Freq: Two times a day (BID) | ORAL | Status: DC
  Administered 2014-10-26 – 2014-10-27 (×2): 17 g via ORAL
  Filled 2014-10-26 (×4): qty 1

## 2014-10-26 MED ORDER — LACTATED RINGERS IV SOLN: INTRAVENOUS | Status: DC

## 2014-10-26 MED ORDER — OXYCODONE HCL ER 20 MG PO T12A: 80.0000 mg | EXTENDED_RELEASE_TABLET | Freq: Two times a day (BID) | ORAL | Status: DC

## 2014-10-26 MED ORDER — METOCLOPRAMIDE HCL 5 MG/ML IJ SOLN: 5.0000 mg | Freq: Three times a day (TID) | INTRAMUSCULAR | Status: DC | PRN

## 2014-10-26 MED ORDER — SODIUM CHLORIDE 0.9 % IV SOLN
INTRAVENOUS | Status: DC | PRN
  Administered 2014-10-26 (×2): via INTRAVENOUS

## 2014-10-26 MED ORDER — ACETAMINOPHEN 10 MG/ML IV SOLN
INTRAVENOUS | Status: AC
  Filled 2014-10-26: qty 100

## 2014-10-26 MED ORDER — FUROSEMIDE 40 MG PO TABS: 40.0000 mg | ORAL_TABLET | Freq: Every day | ORAL | Status: DC

## 2014-10-26 MED ORDER — ACETAMINOPHEN 10 MG/ML IV SOLN
INTRAVENOUS | Status: DC | PRN
  Administered 2014-10-26: 1000 mg via INTRAVENOUS

## 2014-10-26 MED ORDER — BUPIVACAINE-EPINEPHRINE (PF) 0.25% -1:200000 IJ SOLN
INTRAMUSCULAR | Status: AC
  Filled 2014-10-26: qty 30

## 2014-10-26 MED ORDER — METOCLOPRAMIDE HCL 10 MG PO TABS: 5.0000 mg | ORAL_TABLET | Freq: Three times a day (TID) | ORAL | Status: DC | PRN

## 2014-10-26 MED ORDER — CEFAZOLIN SODIUM-DEXTROSE 2-3 GM-% IV SOLR
2.0000 g | Freq: Four times a day (QID) | INTRAVENOUS | Status: AC
  Administered 2014-10-26 – 2014-10-27 (×2): 2 g via INTRAVENOUS
  Filled 2014-10-26 (×2): qty 50

## 2014-10-26 MED ORDER — ACETAMINOPHEN 500 MG PO TABS
1000.0000 mg | ORAL_TABLET | Freq: Four times a day (QID) | ORAL | Status: AC
  Administered 2014-10-26: 1000 mg via ORAL
  Filled 2014-10-26 (×2): qty 2

## 2014-10-26 MED ORDER — FEBUXOSTAT 80 MG PO TABS: 1.0000 | ORAL_TABLET | Freq: Every day | ORAL | Status: DC

## 2014-10-26 MED ORDER — BUPIVACAINE-EPINEPHRINE 0.25% -1:200000 IJ SOLN
INTRAMUSCULAR | Status: DC | PRN
  Administered 2014-10-26: 30 mL

## 2014-10-26 MED ORDER — MENTHOL 3 MG MT LOZG: 1.0000 | LOZENGE | OROMUCOSAL | Status: DC | PRN

## 2014-10-26 MED ORDER — HYDROMORPHONE HCL 1 MG/ML IJ SOLN
0.2500 mg | INTRAMUSCULAR | Status: DC | PRN
  Administered 2014-10-26 (×4): 0.5 mg via INTRAVENOUS

## 2014-10-26 MED ORDER — OXYCODONE HCL 5 MG PO TABS
15.0000 mg | ORAL_TABLET | ORAL | Status: DC | PRN
  Administered 2014-10-26 – 2014-10-27 (×7): 15 mg via ORAL
  Filled 2014-10-26 (×8): qty 3

## 2014-10-26 MED ORDER — PHENOL 1.4 % MT LIQD: 1.0000 | OROMUCOSAL | Status: DC | PRN

## 2014-10-26 MED ORDER — DOCUSATE SODIUM 100 MG PO CAPS
100.0000 mg | ORAL_CAPSULE | Freq: Two times a day (BID) | ORAL | Status: DC
  Administered 2014-10-26 – 2014-10-27 (×2): 100 mg via ORAL
  Filled 2014-10-26 (×2): qty 1

## 2014-10-26 MED ORDER — DEXAMETHASONE SODIUM PHOSPHATE 10 MG/ML IJ SOLN
10.0000 mg | Freq: Three times a day (TID) | INTRAMUSCULAR | Status: DC
  Administered 2014-10-26: 10 mg via INTRAVENOUS
  Filled 2014-10-26 (×3): qty 1

## 2014-10-26 MED ORDER — ACETAMINOPHEN 650 MG RE SUPP: 650.0000 mg | Freq: Four times a day (QID) | RECTAL | Status: DC | PRN

## 2014-10-26 MED ORDER — LIDOCAINE HCL (CARDIAC) 20 MG/ML IV SOLN
INTRAVENOUS | Status: DC | PRN
  Administered 2014-10-26: 50 mg via INTRAVENOUS

## 2014-10-26 MED ORDER — DEXAMETHASONE SODIUM PHOSPHATE 10 MG/ML IJ SOLN
INTRAMUSCULAR | Status: DC | PRN
  Administered 2014-10-26: 10 mg via INTRAVENOUS

## 2014-10-26 MED ORDER — MIDAZOLAM HCL 2 MG/2ML IJ SOLN
1.0000 mg | Freq: Once | INTRAMUSCULAR | Status: AC
  Administered 2014-10-26: 1 mg via INTRAVENOUS

## 2014-10-26 MED ORDER — POTASSIUM CHLORIDE IN NACL 20-0.9 MEQ/L-% IV SOLN
INTRAVENOUS | Status: DC
  Filled 2014-10-26 (×4): qty 1000

## 2014-10-26 MED ORDER — EPHEDRINE SULFATE 50 MG/ML IJ SOLN
INTRAMUSCULAR | Status: DC | PRN
  Administered 2014-10-26 (×2): 10 mg via INTRAVENOUS
  Administered 2014-10-26 (×2): 5 mg via INTRAVENOUS

## 2014-10-26 MED ORDER — OXYCODONE HCL 5 MG/5ML PO SOLN: 5.0000 mg | Freq: Once | ORAL | Status: AC | PRN

## 2014-10-26 SURGICAL SUPPLY — 80 items
APL SKNCLS STERI-STRIP NONHPOA (GAUZE/BANDAGES/DRESSINGS) ×1
BANDAGE ELASTIC 6 VELCRO ST LF (GAUZE/BANDAGES/DRESSINGS) IMPLANT
BANDAGE ESMARK 6X9 LF (GAUZE/BANDAGES/DRESSINGS) ×1 IMPLANT
BENZOIN TINCTURE PRP APPL 2/3 (GAUZE/BANDAGES/DRESSINGS) ×3 IMPLANT
BLADE SAGITTAL 25.0X1.19X90 (BLADE) ×2 IMPLANT
BLADE SAGITTAL 25.0X1.19X90MM (BLADE) ×1
BLADE SAW SGTL 11.0X1.19X90.0M (BLADE) IMPLANT
BLADE SAW SGTL 13.0X1.19X90.0M (BLADE) ×3 IMPLANT
BLADE SURG 10 STRL SS (BLADE) ×6 IMPLANT
BNDG CMPR 9X6 STRL LF SNTH (GAUZE/BANDAGES/DRESSINGS) ×1
BNDG CMPR MED 15X6 ELC VLCR LF (GAUZE/BANDAGES/DRESSINGS) ×1
BNDG ELASTIC 6X15 VLCR STRL LF (GAUZE/BANDAGES/DRESSINGS) ×3 IMPLANT
BNDG ESMARK 6X9 LF (GAUZE/BANDAGES/DRESSINGS) ×3
BOWL SMART MIX CTS (DISPOSABLE) ×3 IMPLANT
CAP KNEE TOTAL 3 SIGMA ×2 IMPLANT
CEMENT HV SMART SET (Cement) ×6 IMPLANT
CLOSURE STERI-STRIP 1/2X4 (GAUZE/BANDAGES/DRESSINGS)
CLOSURE WOUND 1/2 X4 (GAUZE/BANDAGES/DRESSINGS) ×1
CLSR STERI-STRIP ANTIMIC 1/2X4 (GAUZE/BANDAGES/DRESSINGS) IMPLANT
COVER SURGICAL LIGHT HANDLE (MISCELLANEOUS) ×3 IMPLANT
CUFF TOURNIQUET SINGLE 34IN LL (TOURNIQUET CUFF) ×3 IMPLANT
CUFF TOURNIQUET SINGLE 44IN (TOURNIQUET CUFF) IMPLANT
DRAPE EXTREMITY T 121X128X90 (DRAPE) ×3 IMPLANT
DRAPE IMP U-DRAPE 54X76 (DRAPES) ×3 IMPLANT
DRAPE INCISE IOBAN 66X45 STRL (DRAPES) ×1 IMPLANT
DRAPE PROXIMA HALF (DRAPES) ×3 IMPLANT
DRAPE U-SHAPE 47X51 STRL (DRAPES) ×3 IMPLANT
DRSG AQUACEL AG ADV 3.5X10 (GAUZE/BANDAGES/DRESSINGS) IMPLANT
DRSG AQUACEL AG ADV 3.5X14 (GAUZE/BANDAGES/DRESSINGS) ×3 IMPLANT
DRSG PAD ABDOMINAL 8X10 ST (GAUZE/BANDAGES/DRESSINGS) ×6 IMPLANT
DURAPREP 26ML APPLICATOR (WOUND CARE) ×6 IMPLANT
ELECT CAUTERY BLADE 6.4 (BLADE) ×3 IMPLANT
ELECT REM PT RETURN 9FT ADLT (ELECTROSURGICAL) ×3
ELECTRODE REM PT RTRN 9FT ADLT (ELECTROSURGICAL) ×1 IMPLANT
EVACUATOR 1/8 PVC DRAIN (DRAIN) ×3 IMPLANT
FACESHIELD WRAPAROUND (MASK) ×3 IMPLANT
FACESHIELD WRAPAROUND OR TEAM (MASK) ×1 IMPLANT
GAUZE SPONGE 4X4 12PLY STRL (GAUZE/BANDAGES/DRESSINGS) ×3 IMPLANT
GLOVE BIO SURGEON STRL SZ7 (GLOVE) ×7 IMPLANT
GLOVE BIOGEL PI IND STRL 7.0 (GLOVE) ×1 IMPLANT
GLOVE BIOGEL PI IND STRL 7.5 (GLOVE) ×1 IMPLANT
GLOVE BIOGEL PI INDICATOR 7.0 (GLOVE) ×4
GLOVE BIOGEL PI INDICATOR 7.5 (GLOVE) ×6
GLOVE SS BIOGEL STRL SZ 7.5 (GLOVE) ×1 IMPLANT
GLOVE SUPERSENSE BIOGEL SZ 7.5 (GLOVE) ×2
GOWN STRL REUS W/ TWL LRG LVL3 (GOWN DISPOSABLE) ×2 IMPLANT
GOWN STRL REUS W/ TWL XL LVL3 (GOWN DISPOSABLE) ×2 IMPLANT
GOWN STRL REUS W/TWL LRG LVL3 (GOWN DISPOSABLE) ×6
GOWN STRL REUS W/TWL XL LVL3 (GOWN DISPOSABLE) ×9
HANDPIECE INTERPULSE COAX TIP (DISPOSABLE) ×3
HOOD PEEL AWAY FACE SHEILD DIS (HOOD) ×8 IMPLANT
IMMOBILIZER KNEE 22 UNIV (SOFTGOODS) ×2 IMPLANT
KIT BASIN OR (CUSTOM PROCEDURE TRAY) ×3 IMPLANT
KIT ROOM TURNOVER OR (KITS) ×3 IMPLANT
MANIFOLD NEPTUNE II (INSTRUMENTS) ×3 IMPLANT
MARKER SKIN DUAL TIP RULER LAB (MISCELLANEOUS) ×3 IMPLANT
NS IRRIG 1000ML POUR BTL (IV SOLUTION) ×3 IMPLANT
PACK TOTAL JOINT (CUSTOM PROCEDURE TRAY) ×3 IMPLANT
PACK UNIVERSAL I (CUSTOM PROCEDURE TRAY) ×3 IMPLANT
PAD ABD 8X10 STRL (GAUZE/BANDAGES/DRESSINGS) IMPLANT
PAD ARMBOARD 7.5X6 YLW CONV (MISCELLANEOUS) ×6 IMPLANT
PADDING CAST ABS 6INX4YD NS (CAST SUPPLIES)
PADDING CAST ABS COTTON 6X4 NS (CAST SUPPLIES) IMPLANT
PADDING CAST COTTON 6X4 STRL (CAST SUPPLIES) ×3 IMPLANT
RUBBERBAND STERILE (MISCELLANEOUS) ×3 IMPLANT
SET HNDPC FAN SPRY TIP SCT (DISPOSABLE) ×1 IMPLANT
SPONGE GAUZE 4X4 12PLY STER LF (GAUZE/BANDAGES/DRESSINGS) IMPLANT
STRIP CLOSURE SKIN 1/2X4 (GAUZE/BANDAGES/DRESSINGS) ×2 IMPLANT
SUCTION FRAZIER TIP 10 FR DISP (SUCTIONS) ×3 IMPLANT
SUT ETHIBOND NAB CT1 #1 30IN (SUTURE) ×6 IMPLANT
SUT MNCRL AB 3-0 PS2 18 (SUTURE) ×3 IMPLANT
SUT VIC AB 0 CT1 27 (SUTURE) ×6
SUT VIC AB 0 CT1 27XBRD ANBCTR (SUTURE) ×2 IMPLANT
SUT VIC AB 2-0 CT1 27 (SUTURE) ×6
SUT VIC AB 2-0 CT1 TAPERPNT 27 (SUTURE) ×2 IMPLANT
SYR 30ML SLIP (SYRINGE) ×3 IMPLANT
TOWEL OR 17X24 6PK STRL BLUE (TOWEL DISPOSABLE) ×3 IMPLANT
TOWEL OR 17X26 10 PK STRL BLUE (TOWEL DISPOSABLE) ×3 IMPLANT
TRAY FOLEY CATH 16FR SILVER (SET/KITS/TRAYS/PACK) ×3 IMPLANT
WATER STERILE IRR 1000ML POUR (IV SOLUTION) ×6 IMPLANT

## 2014-10-26 NOTE — Progress Notes (Signed)
Utilization review completed.  

## 2014-10-26 NOTE — Progress Notes (Signed)
Physical Therapy Evaluation Patient Details Name: Nathan GroomsStacey L Stettner MRN: [id number] DOB: 08/08/1971 Today's Date: 10/26/2014   History of Present Illness  Patient is a 43 yo male admitted 10/26/14 now s/p Rt TKA.  PMH:  Lt TKA 03/30/14, OA, gout, CKD, chronic pain, HTN  Clinical Impression  Patient presents with problems listed below.  Will benefit from acute PT to maximize independence prior to discharge home with family.  Patient did very well with mobility and gait today, ambulating 160'.  Anticipate patient will be able to do stairs at next session and be ready for d/c tomorrow.    Follow Up Recommendations Home health PT;Supervision/Assistance - 24 hour    Equipment Recommendations  None recommended by PT    Recommendations for Other Services       Precautions / Restrictions Precautions Precautions: Knee Precaution Booklet Issued: Yes (comment) Precaution Comments: Reviewed precautions with patient. Required Braces or Orthoses: Knee Immobilizer - Right Knee Immobilizer - Right: On when out of bed or walking Restrictions Weight Bearing Restrictions: Yes RLE Weight Bearing: Weight bearing as tolerated      Mobility  Bed Mobility Overal bed mobility: Needs Assistance Bed Mobility: Supine to Sit     Supine to sit: Supervision     General bed mobility comments: Instructed patient on donning KI on RLE.  Verbal cues for bed mobility technique.  Assist for safety only.  Transfers Overall transfer level: Needs assistance Equipment used: Rolling walker (2 wheeled) Transfers: Sit to/from Stand Sit to Stand: Supervision         General transfer comment: Verbal cues for hand placement and technique.  Assist for safety only.  Ambulation/Gait Ambulation/Gait assistance: Supervision Ambulation Distance (Feet): 160 Feet Assistive device: Rolling walker (2 wheeled) Gait Pattern/deviations: Step-through pattern;Decreased stance time - right;Decreased step length -  left;Decreased weight shift to right;Antalgic Gait velocity: Decreased Gait velocity interpretation: Below normal speed for age/gender General Gait Details: Verbal cues for safe use of RW.  Supervision for safety only.  Cues for heel strike on RLE - walking on forefoot.  Supervision for safety only.  Stairs            Wheelchair Mobility    Modified Rankin (Stroke Patients Only)       Balance                                             Pertinent Vitals/Pain Pain Assessment: 0-10 Pain Score: 4  Pain Location: Rt knee Pain Descriptors / Indicators: Aching;Sore Pain Intervention(s): RN gave pain meds during session;Repositioned    Home Living Family/patient expects to be discharged to:: Private residence Living Arrangements: Spouse/significant other;Children 70(19 yo daughter) Available Help at Discharge: Family;Available 24 hours/day Type of Home: House Home Access: Stairs to enter Entrance Stairs-Rails: Right;Left;Can reach both Entrance Stairs-Number of Steps: 5 Home Layout: One level Home Equipment: Walker - 2 wheels;Bedside commode;Shower seat - built in;Cane - single point      Prior Function Level of Independence: Independent         Comments: Works as Pharmacist, communitydelivery driver     Hand Dominance        Extremity/Trunk Assessment   Upper Extremity Assessment: Overall WFL for tasks assessed           Lower Extremity Assessment: RLE deficits/detail RLE Deficits / Details: Decreased strength and ROM post-op. Able to lift RLE against  gravity.    Cervical / Trunk Assessment: Normal  Communication   Communication: No difficulties  Cognition Arousal/Alertness: Awake/alert Behavior During Therapy: WFL for tasks assessed/performed Overall Cognitive Status: Within Functional Limits for tasks assessed                      General Comments      Exercises Total Joint Exercises Ankle Circles/Pumps: AROM;Both;10 reps;Seated       Assessment/Plan    PT Assessment Patient needs continued PT services  PT Diagnosis Difficulty walking;Acute pain   PT Problem List Decreased strength;Decreased range of motion;Decreased balance;Decreased mobility;Decreased knowledge of use of DME;Decreased knowledge of precautions;Pain  PT Treatment Interventions DME instruction;Gait training;Stair training;Functional mobility training;Therapeutic activities;Therapeutic exercise;Patient/family education   PT Goals (Current goals can be found in the Care Plan section) Acute Rehab PT Goals Patient Stated Goal: To go home tomorrow PT Goal Formulation: With patient Time For Goal Achievement: 11/02/14 Potential to Achieve Goals: Good    Frequency 7X/week   Barriers to discharge        Co-evaluation               End of Session Equipment Utilized During Treatment: Gait belt;Right knee immobilizer Activity Tolerance: Patient tolerated treatment well Patient left: in chair;with call bell/phone within reach Nurse Communication: Mobility status;Patient requests pain meds         Time: 1727-1755 PT Time Calculation (min) (ACUTE ONLY): 28 min   Charges:   PT Evaluation $Initial PT Evaluation Tier I: 1 Procedure PT Treatments $Gait Training: 23-37 mins   PT G Codes:          Vena AustriaDavis, Audley Hinojos H 10/26/2014, 6:33 PM Durenda HurtSusan H. Renaldo Fiddleravis, PT, [facility] Acute Rehab Services Pager [phone]

## 2014-10-26 NOTE — Anesthesia Procedure Notes (Addendum)
Procedure Name: MAC Date/Time: 10/26/2014 7:36 AM Performed by: Carmela RimaMARTINELLI, JOHN F Pre-anesthesia Checklist: Patient being monitored, Suction available, Emergency Drugs available, Patient identified and Timeout performed Patient Re-evaluated:Patient Re-evaluated prior to inductionOxygen Delivery Method: Simple face mask Preoxygenation: Pre-oxygenation with 100% oxygen Placement Confirmation: positive ETCO2   Spinal Patient location during procedure: OR Staffing Anesthesiologist: Sharra Cayabyab, CHRIS Preanesthetic Checklist Completed: patient identified, surgical consent, pre-op evaluation, timeout performed, IV checked, risks and benefits discussed and monitors and equipment checked Spinal Block Patient position: sitting Prep: site prepped and draped and DuraPrep Patient monitoring: heart rate, cardiac monitor, continuous pulse ox and blood pressure Approach: midline Location: L3-4 Injection technique: single-shot Needle Needle type: Pencan  Needle gauge: 24 G Needle length: 10 cm Assessment Sensory level: T6  Anesthesia Regional Block:  Adductor canal block  Pre-Anesthetic Checklist: ,, timeout performed, Correct Patient, Correct Site, Correct Laterality, Correct Procedure, Correct Position, site marked, Risks and benefits discussed,  Surgical consent,  Pre-op evaluation,  At surgeon's request and post-op pain management  Laterality: Lower and Right  Prep: chloraprep       Needles:  Injection technique: Single-shot  Needle Type: Echogenic Needle          Additional Needles:  Procedures: ultrasound guided (picture in chart) Adductor canal block Narrative:  Injection made incrementally with aspirations every 5 mL.  Performed by: Personally   Additional Notes: H+P and labs reviewed, risks and benefits discussed with patient, procedure tolerated well without complications

## 2014-10-26 NOTE — Interval H&P Note (Signed)
History and Physical Interval Note:  10/26/2014 7:01 AM  Nathan GroomsStacey L Hartline  has presented today for surgery, with the diagnosis of PRIMARY LOCALIZED OA RIGHT KNEE  The various methods of treatment have been discussed with the patient and family. After consideration of risks, benefits and other options for treatment, the patient has consented to  Procedure(s): TOTAL KNEE ARTHROPLASTY (Right) as a surgical intervention .  The patient's history has been reviewed, patient examined, no change in status, stable for surgery.  I have reviewed the patient's chart and labs.  Questions were answered to the patient's satisfaction.     Salvatore MarvelWAINER,Kalesha Irving A

## 2014-10-26 NOTE — Plan of Care (Signed)
Problem: Problem: Pain Management Progression Goal: Pain controlled Outcome: Not Progressing Patient pain not progressing. MD aware and medications adjusted.

## 2014-10-26 NOTE — Transfer of Care (Signed)
Immediate Anesthesia Transfer of Care Note  Patient: Nathan GroomsStacey L Ramos  Procedure(s) Performed: Procedure(s): TOTAL KNEE ARTHROPLASTY (Right)  Patient Location: PACU  Anesthesia Type:Spinal  Level of Consciousness: awake, alert  and oriented  Airway & Oxygen Therapy: Patient Spontanous Breathing and Patient connected to nasal cannula oxygen  Post-op Assessment: Report given to PACU RN and Post -op Vital signs reviewed and stable  Post vital signs: Reviewed and stable  Complications: No apparent anesthesia complications

## 2014-10-26 NOTE — Anesthesia Preprocedure Evaluation (Signed)
Anesthesia Evaluation  Patient identified by MRN, date of birth, ID band Patient awake    Reviewed: Allergy & Precautions, H&P , NPO status , Patient's Chart, lab work & pertinent test results  History of Anesthesia Complications Negative for: history of anesthetic complications  Airway Mallampati: II  TM Distance: >3 FB Neck ROM: Full    Dental  (+) Teeth Intact   Pulmonary neg sleep apnea, neg COPDCurrent Smoker,  breath sounds clear to auscultation        Cardiovascular hypertension, Pt. on medications - angina- Past MI and - CHF Rhythm:Regular     Neuro/Psych negative neurological ROS  negative psych ROS   GI/Hepatic negative GI ROS, Neg liver ROS,   Endo/Other  Morbid obesity  Renal/GU Renal InsufficiencyRenal disease     Musculoskeletal  (+) Arthritis -,   Abdominal   Peds  Hematology negative hematology ROS (+)   Anesthesia Other Findings   Reproductive/Obstetrics                             Anesthesia Physical Anesthesia Plan  ASA: III  Anesthesia Plan: General   Post-op Pain Management:    Induction: Intravenous  Airway Management Planned: Oral ETT  Additional Equipment: None  Intra-op Plan:   Post-operative Plan: Extubation in OR  Informed Consent: I have reviewed the patients History and Physical, chart, labs and discussed the procedure including the risks, benefits and alternatives for the proposed anesthesia with the patient or authorized representative who has indicated his/her understanding and acceptance.   Dental advisory given  Plan Discussed with: CRNA and Surgeon  Anesthesia Plan Comments:         Anesthesia Quick Evaluation

## 2014-10-26 NOTE — Progress Notes (Signed)
Orthopedic Tech Progress Note Patient Details:  Nathan GroomsStacey L Ramos 10/15/1971 [id number] CPM applied to RLE with appropriate settings. OHF applied to bed. Footsie roll provided. CPM Right Knee CPM Right Knee: On Right Knee Flexion (Degrees): 90 Right Knee Extension (Degrees): 0   Asia R Thompson 10/26/2014, 10:24 AM

## 2014-10-26 NOTE — Anesthesia Postprocedure Evaluation (Signed)
  Anesthesia Post-op Note  Patient: Sinclair GroomsStacey L Deer  Procedure(s) Performed: Procedure(s): TOTAL KNEE ARTHROPLASTY (Right)  Patient Location: PACU  Anesthesia Type:General  Level of Consciousness: awake and alert   Airway and Oxygen Therapy: Patient Spontanous Breathing and Patient connected to nasal cannula oxygen  Post-op Pain: mild  Post-op Assessment: Post-op Vital signs reviewed and Patient's Cardiovascular Status Stable  Post-op Vital Signs: Reviewed and stable  Last Vitals:  Filed Vitals:   10/26/14 1300  BP: 103/43  Pulse: 84  Temp:   Resp: 11    Complications: No apparent anesthesia complications

## 2014-10-26 NOTE — Op Note (Signed)
MRN:     [id number] DOB/AGE:    41/27/1972 / 41 y.o.       OPERATIVE REPORT    DATE OF PROCEDURE:  10/26/2014       PREOPERATIVE DIAGNOSIS:   PRIMARY LOCALIZED OA RIGHT KNEE      Estimated body mass index is 34.39 kg/(m^2) as calculated from the following:   Height as of this encounter: 5\' 9"  (1.753 m).   Weight as of this encounter: 105.688 kg (233 lb).                                                        POSTOPERATIVE DIAGNOSIS:  SAME                                                                      PROCEDURE:  Procedure(s): TOTAL KNEE ARTHROPLASTY Using Depuy Sigma RP implants #4 Femur, #5Tibia, 12.565mm sigma RP bearing, 35 Patella     SURGEON: Damia Bobrowski A    ASSISTANT:  Kirstin Shepperson PA-C   (Present and scrubbed throughout the case, critical for assistance with exposure, retraction, instrumentation, and closure.)         ANESTHESIA: SPINAL with Femoral Nerve Block  DRAINS: foley, 2 medium hemovac in knee   TOURNIQUET TIME: 75min   COMPLICATIONS:  None     SPECIMENS: None   INDICATIONS FOR PROCEDURE: The patient has  DJD RIGHT KNEE, varus deformities, XR shows bone on bone arthritis. Patient has failed all conservative measures including anti-inflammatory medicines, narcotics, attempts at  exercise and weight loss, cortisone injections and viscosupplementation.  Risks and benefits of surgery have been discussed, questions answered.   DESCRIPTION OF PROCEDURE: The patient identified by armband, received  right femoral nerve block and IV antibiotics[facility] Patient taken to the operating room, appropriate anesthetic  monitors were attached General endotracheal anesthesia induced with  the patient in supine position, Foley catheter was inserted. Tourniquet  applied high to the operative thigh. Lateral post and foot positioner  applied to the table, the lower extremity was then prepped and draped  in usual sterile fashion from the  ankle to the tourniquet. Time-out procedure was performed. The limb was wrapped with an Esmarch bandage and the tourniquet inflated to 365 mmHg. We began the operation by making the anterior midline incision starting at handbreadth above the patella going over the patella 1 cm medial to and  4 cm distal to the tibial tubercle. Small bleeders in the skin and the  subcutaneous tissue identified and cauterized. Transverse retinaculum was incised and reflected medially and a medial parapatellar arthrotomy was accomplished. the patella was everted and theprepatellar fat pad resected. The superficial medial collateral  ligament was then elevated from anterior to posterior along the proximal  flare of the tibia and anterior half of the menisci resected. The knee was hyperflexed exposing bone on bone arthritis. Peripheral and notch osteophytes as well as the cruciate ligaments were then resected. We continued to  work our way around posteriorly along the proximal tibia, and externally  rotated the tibia subluxing it out from underneath the femur. A McHale  retractor was placed through the notch and a lateral Hohmann retractor  placed, and we then drilled through the proximal tibia in line with the  axis of the tibia followed by an intramedullary guide rod and 2-degree  posterior slope cutting guide. The tibial cutting guide was pinned into place  allowing resection of 4 mm of bone medially and about 6 mm of bone  laterally because of her varus deformity. Satisfied with the tibial resection, we then  entered the distal femur 2 mm anterior to the PCL origin with the  intramedullary guide rod and applied the distal femoral cutting guide  set at 11mm, with 5 degrees of valgus. This was pinned along the  epicondylar axis. At this point, the distal femoral cut was accomplished without difficulty. We then sized for a #4 femoral component and pinned the guide in 3 degrees of external rotation.The chamfer cutting  guide was pinned into place. The anterior, posterior, and chamfer cuts were accomplished without difficulty followed by  the Sigma RP box cutting guide and the box cut. We also removed posterior osteophytes from the posterior femoral condyles. At this  time, the knee was brought into full extension. We checked our  extension and flexion gaps and found them symmetric at 12.995mm.  The patella thickness measured at 26 mm. We set the cutting guide at 17 and removed the posterior 9.5-10 mm  of the patella sized for 35 button and drilled the lollipop. The knee  was then once again hyperflexed exposing the proximal tibia. We sized for a #5 tibial base plate, applied the smokestack and the conical reamer followed by the the Delta fin keel punch. We then hammered into place the Sigma RP trial femoral component, inserted a 12.5-mm trial bearing, trial patellar button, and took the knee through range of motion from 0-130 degrees. No thumb pressure was required for patellar  tracking. At this point, all trial components were removed, a double batch of DePuy HV cement  was mixed and applied to all bony metallic mating surfaces except for the posterior condyles of the femur itself. In order, we  hammered into place the tibial tray and removed excess cement, the femoral component and removed excess cement, a 12.5-mm Sigma RP bearing  was inserted, and the knee brought to full extension with compression.  The patellar button was clamped into place, and excess cement  removed. While the cement cured the wound was irrigated out with normal saline solution pulse lavage, and medium Hemovac drains were placed.. Ligament stability and patellar tracking were checked and found to be excellent. The tourniquet was then released and hemostasis was obtained with cautery. The parapatellar arthrotomy was closed with  #1 ethibond suture. The subcutaneous tissue with 0 and 2-0 undyed  Vicryl suture, and 4-0 Monocryl.. A dressing of  Xeroform,  4 x 4, dressing sponges, Webril, and Ace wrap applied. Needle and sponge count were correct times 2.The patient awakened, extubated, and taken to recovery room without difficulty. Vascular status was normal, pulses 2+ and symmetric.   Nathan Ramos A 10/26/2014, 8:56 AM

## 2014-10-27 ENCOUNTER — Encounter (HOSPITAL_COMMUNITY): Payer: Self-pay | Admitting: Orthopedic Surgery

## 2014-10-27 LAB — BASIC METABOLIC PANEL
Anion gap: 16 — ABNORMAL HIGH (ref 5–15)
BUN: 59 mg/dL — ABNORMAL HIGH (ref 6–23)
CO2: 18 mEq/L — ABNORMAL LOW (ref 19–32)
Calcium: 9.1 mg/dL (ref 8.4–10.5)
Chloride: 103 mEq/L (ref 96–112)
Creatinine, Ser: 2.35 mg/dL — ABNORMAL HIGH (ref 0.50–1.35)
GFR calc Af Amer: 38 mL/min — ABNORMAL LOW (ref >90)
GFR calc non Af Amer: 33 mL/min — ABNORMAL LOW (ref >90)
Glucose, Bld: 178 mg/dL — ABNORMAL HIGH (ref 70–99)
Potassium: 5.1 mEq/L (ref 3.7–5.3)
Sodium: 137 mEq/L (ref 137–147)

## 2014-10-27 LAB — CBC
HCT: 27.7 % — ABNORMAL LOW (ref 39.0–52.0)
Hemoglobin: 9 g/dL — ABNORMAL LOW (ref 13.0–17.0)
MCH: 27.4 pg (ref 26.0–34.0)
MCHC: 32.5 g/dL (ref 30.0–36.0)
MCV: 84.5 fL (ref 78.0–100.0)
Platelets: 246 10*3/uL (ref 150–400)
RBC: 3.28 MIL/uL — ABNORMAL LOW (ref 4.22–5.81)
RDW: 13.5 % (ref 11.5–15.5)
WBC: 15.3 10*3/uL — ABNORMAL HIGH (ref 4.0–10.5)

## 2014-10-27 MED ORDER — OXYCODONE HCL 15 MG PO TABS: 15.0000 mg | ORAL_TABLET | ORAL | Status: DC | PRN

## 2014-10-27 MED ORDER — DSS 100 MG PO CAPS: 100.0000 mg | ORAL_CAPSULE | Freq: Two times a day (BID) | ORAL | Status: DC

## 2014-10-27 MED ORDER — ENOXAPARIN SODIUM 30 MG/0.3ML ~~LOC~~ SOLN: 30.0000 mg | SUBCUTANEOUS | Status: DC

## 2014-10-27 MED ORDER — MORPHINE SULFATE ER 60 MG PO TBCR: 60.0000 mg | EXTENDED_RELEASE_TABLET | Freq: Two times a day (BID) | ORAL | Status: DC

## 2014-10-27 MED ORDER — POLYETHYLENE GLYCOL 3350 17 G PO PACK: 17.0000 g | PACK | Freq: Two times a day (BID) | ORAL | Status: DC

## 2014-10-27 NOTE — Progress Notes (Signed)
OT Cancellation Note and Discharge  Patient Details Name: Nathan GroomsStacey L Kogler MRN: [id number] DOB: 09/19/1971   Cancelled Treatment:    Reason Eval/Treat Not Completed: OT screened, no needs identified, will sign off. Pt had other knee done 8 months ago and does not have any concerns/questions about OT.  Marland Kitchen.  Evette GeorgesLeonard, Jonetta Dagley Eva [phone] 10/27/2014, 9:01 AM

## 2014-10-27 NOTE — Plan of Care (Signed)
Problem: Consults Goal: Diagnosis- Total Joint Replacement Outcome: Completed/Met Date Met:  10/27/14 Primary Total Knee Right

## 2014-10-27 NOTE — Care Management Note (Signed)
CARE MANAGEMENT NOTE 10/27/2014  Patient:  [name],Mazen L   Account Number:  [id number]  Date Initiated:  10/27/2014  Documentation initiated by:  Vance PeperBRADY,Akeira Lahm  Subjective/Objective Assessment:   43 yr old male admitted with osteo arthritis of the right knee. patient had a right total knee arthroplasty.     Action/Plan:   Case manager spoike with patient and wife concerning home health and DME needs. Choice offered. Referral called to [facility]. patient has RW and 3in1. Has family support at discharge.   Anticipated DC Date:  10/27/2014   Anticipated DC Plan:  HOME W HOME HEALTH SERVICES      DC Planning Services  CM consult      [facility]  DURABLE MEDICAL EQUIPMENT   Choice offered to / List presented to:  C-1 Patient   DME arranged  CPM      DME agency  [employer]     HH arranged  HH-2 PT      HH agency  [facility]   Status of service:  Completed, signed off Medicare Important Message given?  NA - LOS <3 / Initial given by admissions (If response is "NO", the following Medicare IM given date fields will be blank) Date Medicare IM given:   Medicare IM given by:   Date Additional Medicare IM given:   Additional Medicare IM given by:    Discharge Disposition:  HOME W HOME HEALTH SERVICES  Per UR Regulation:  Reviewed for med. necessity/level of care/duration of stay

## 2014-10-27 NOTE — Progress Notes (Signed)
Physical Therapy Treatment Patient Details Name: Nathan GroomsStacey L Ramos MRN: [id number] DOB: 05/12/1971 Today's Date: 10/27/2014    History of Present Illness Patient is a 43 yo male admitted 10/26/14 now s/p Rt TKA.  PMH:  Lt TKA 03/30/14, OA, gout, CKD, chronic pain, HTN    PT Comments    Patient progressing well and able to complete stair training this AM. Patient safe to D/C from a mobility standpoint based on progression towards goals set on PT eval.    Follow Up Recommendations  Home health PT;Supervision/Assistance - 24 hour     Equipment Recommendations  None recommended by PT    Recommendations for Other Services       Precautions / Restrictions Precautions Precautions: Knee Precaution Comments: Reviewed precautions with patient. Restrictions RLE Weight Bearing: Weight bearing as tolerated    Mobility  Bed Mobility Overal bed mobility: Modified Independent                Transfers Overall transfer level: Modified independent                  Ambulation/Gait Ambulation/Gait assistance: Supervision Ambulation Distance (Feet): 300 Feet Assistive device: Rolling walker (2 wheeled) Gait Pattern/deviations: Step-through pattern;Decreased stride length     General Gait Details: Cues to keep weight through LEs.    Stairs Stairs: Yes Stairs assistance: Supervision Stair Management: Step to pattern;Forwards;Two rails Number of Stairs: 5 General stair comments: Patient able to recall sequency and technique from previous surgery  Wheelchair Mobility    Modified Rankin (Stroke Patients Only)       Balance                                    Cognition Arousal/Alertness: Awake/alert Behavior During Therapy: WFL for tasks assessed/performed Overall Cognitive Status: Within Functional Limits for tasks assessed                      Exercises Total Joint Exercises Quad Sets: AROM;Left;10 reps Heel Slides: Left;10  reps;AROM Hip ABduction/ADduction: Left;10 reps;AROM Straight Leg Raises: AROM;Left;10 reps Long Arc Quad: AAROM;Left;10 reps    General Comments        Pertinent Vitals/Pain Pain Score: 7  Pain Location: R knee Pain Descriptors / Indicators: Burning;Sore Pain Intervention(s): Monitored during session    Home Living                      Prior Function            PT Goals (current goals can now be found in the care plan section) Progress towards PT goals: Progressing toward goals    Frequency  7X/week    PT Plan Current plan remains appropriate    Co-evaluation             End of Session   Activity Tolerance: Patient tolerated treatment well Patient left: in chair;with call bell/phone within reach     Time: 0754-0820 PT Time Calculation (min) (ACUTE ONLY): 26 min  Charges:  $Gait Training: 8-22 mins $Therapeutic Exercise: 8-22 mins                    G Codes:      Nathan BirksRobinette, Nathan Ramos 10/27/2014, 8:29 AM  10/27/2014 Nathan Ramos, Nathan Ramos PTA [phone] pager [phone] office

## 2014-10-28 DIAGNOSIS — N183 Chronic kidney disease, stage 3 (moderate): Secondary | ICD-10-CM | POA: Diagnosis not present

## 2014-10-28 DIAGNOSIS — Z96651 Presence of right artificial knee joint: Secondary | ICD-10-CM | POA: Diagnosis not present

## 2014-10-28 DIAGNOSIS — Z96652 Presence of left artificial knee joint: Secondary | ICD-10-CM | POA: Diagnosis not present

## 2014-10-28 DIAGNOSIS — Q615 Medullary cystic kidney: Secondary | ICD-10-CM | POA: Diagnosis not present

## 2014-10-28 DIAGNOSIS — Z471 Aftercare following joint replacement surgery: Secondary | ICD-10-CM | POA: Diagnosis not present

## 2014-10-28 DIAGNOSIS — R2689 Other abnormalities of gait and mobility: Secondary | ICD-10-CM | POA: Diagnosis not present

## 2014-10-28 DIAGNOSIS — I129 Hypertensive chronic kidney disease with stage 1 through stage 4 chronic kidney disease, or unspecified chronic kidney disease: Secondary | ICD-10-CM | POA: Diagnosis not present

## 2014-10-28 DIAGNOSIS — M25561 Pain in right knee: Secondary | ICD-10-CM | POA: Diagnosis not present

## 2014-10-28 DIAGNOSIS — M1000 Idiopathic gout, unspecified site: Secondary | ICD-10-CM | POA: Diagnosis not present

## 2014-10-28 NOTE — Discharge Summary (Signed)
Patient ID: Nathan Ramos MRN: [id number] DOB/AGE: 1971/06/24 43 y.o.  Admit date: 10/26/2014 Discharge date: 10/27/2014  Admission Diagnoses:  Principal Problem:   Primary localized osteoarthritis of right knee Active Problems:   Medullary cystic disease of the kidney   Chronic pain   Hypertension   Gout due to renal impairment   DJD (degenerative joint disease) of knee   Discharge Diagnoses:  Same  Past Medical History  Diagnosis Date  . 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 Left knee DJD   . Gout   . Chronic pain     [name] [facility]NP at [facility]  . Hypertension   . Smoker   . CKD (chronic kidney disease)     stage III 03/2014 (Dr. Mosetta Pigeon)  . Primary localized osteoarthritis of right knee     Surgeries: Procedure(s): TOTAL KNEE ARTHROPLASTY on 10/26/2014   Consultants:    Discharged Condition: Improved  Hospital Course: DOMONIC KIMBALL is an 43 y.o. male who was admitted 10/26/2014 for operative treatment ofPrimary localized osteoarthritis of right knee. Patient has severe unremitting pain that affects sleep, daily activities, and work/hobbies. After pre-op clearance the patient was taken to the operating room on 10/26/2014 and underwent  Procedure(s): TOTAL KNEE ARTHROPLASTY.    Patient was given perioperative antibiotics: Anti-infectives    Start     Dose/Rate Route Frequency Ordered Stop   10/26/14 1430  ceFAZolin (ANCEF) IVPB 2 g/50 mL premix     2 g100 mL/hr over 30 Minutes Intravenous Every 6 hours 10/26/14 1412 10/27/14 0200   10/26/14 0600  ceFAZolin (ANCEF) IVPB 2 g/50 mL premix     2 g100 mL/hr over 30 Minutes Intravenous On call to O.R. 10/25/14 1411 10/26/14 0737       Patient was given sequential compression devices, early ambulation, and chemoprophylaxis to prevent DVT.  Patient benefited maximally from hospital stay and there were no complications.    Recent vital signs: No data  found.    Recent laboratory studies:  Recent Labs  10/26/14 1555 10/27/14 0527  WBC 15.2* 15.3*  HGB 10.0* 9.0*  HCT 31.3* 27.7*  PLT 292 246  NA  --  137  K  --  5.1  CL  --  103  CO2  --  18*  BUN  --  59*  CREATININE 2.51* 2.35*  GLUCOSE  --  178*  CALCIUM  --  9.1     Discharge Medications:     Medication List    TAKE these medications        acetaminophen 325 MG tablet  Commonly known as:  TYLENOL  Take 2 tablets (650 mg total) by mouth every 6 (six) hours as needed for mild pain (or Fever >/= 101).     amLODipine 10 MG tablet  Commonly known as:  NORVASC  Take 10 mg by mouth daily.     DSS 100 MG Caps  Take 100 mg by mouth 2 (two) times daily.     enoxaparin 30 MG/0.3ML injection  Commonly known as:  LOVENOX  Inject 0.3 mLs (30 mg total) into the skin daily.     furosemide 40 MG tablet  Commonly known as:  LASIX  Take 40 mg by mouth daily.     losartan 50 MG tablet  Commonly known as:  COZAAR  Take 50 mg by mouth daily.     morphine 60 MG 12 hr tablet  Commonly known as:  MS CONTIN  Take 1 tablet (60 mg total) by mouth every 12 (twelve) hours.     oxyCODONE 15 MG immediate release tablet  Commonly known as:  ROXICODONE  Take 1 tablet (15 mg total) by mouth every 3 (three) hours as needed for pain.     polyethylene glycol packet  Commonly known as:  MIRALAX / GLYCOLAX  Take 17 g by mouth 2 (two) times daily.     ULORIC 80 MG Tabs  Generic drug:  Febuxostat  Take 1 tablet by mouth daily.        Diagnostic Studies: No results found.  Disposition: 01-Home or Self Care      Discharge Instructions    CPM    Complete by:  As directed   Continuous passive motion machine (CPM):      Use the CPM from 0 to 90 for 6 hours per day.       You may break it up into 2 or 3 sessions per day.      Use CPM for 2 weeks or until you are told to stop.     Call MD / Call 911    Complete by:  As directed   If you experience chest pain or shortness of  breath, CALL 911 and be transported to the hospital emergency room.  If you develope a fever above 101 F, pus (white drainage) or increased drainage or redness at the wound, or calf pain, call your surgeon's office.     Change dressing    Complete by:  As directed   Keep bandage over wound.  Wash whole leg including over the bandage every day with soap and water.     Constipation Prevention    Complete by:  As directed   Drink plenty of fluids.  Prune juice may be helpful.  You may use a stool softener, such as Colace (over the counter) 100 mg twice a day.  Use MiraLax (over the counter) for constipation as needed.     Diet - low sodium heart healthy    Complete by:  As directed      Discharge instructions    Complete by:  As directed   Total Knee Replacement Care After Refer to this sheet in the next few weeks. These discharge instructions provide you with general information on caring for yourself after you leave the hospital. Your caregiver may also give you specific instructions. Your treatment has been planned according to the most current medical practices available, but unavoidable complications sometimes occur. If you have any problems or questions after discharge, please call your caregiver. Regaining a near full range of motion of your knee within the first 3 to 6 weeks after surgery is critical. HOME CARE INSTRUCTIONS  You may resume a normal diet and activities as directed.  Perform exercises as directed.  Place gray foam block, curve side up under heel at all times except when in CPM or when walking.  DO NOT modify, tear, cut, or change in any way the gray foam block. You will receive physical therapy daily  Take showers instead of baths until informed otherwise.  You may shower when there is no drainage for 2 days.  Please wash whole leg including wound with soap and water  Keep bandage over wound.  Wash whole leg including over the bandage every day with soap and water. It is OK  to take over-the-counter tylenol in addition to the oxycodone for pain, discomfort,  or fever. Oxycodone is VERY constipating.  Please take stool softener twice a day and laxatives daily until bowels are regular Eat a well-balanced diet.  Avoid lifting or driving until you are instructed otherwise.  Make an appointment to see your caregiver for stitches (suture) or staple removal as directed.  If you have been sent home with a continuous passive motion machine (CPM machine), 0-90 degrees 6 hrs a day   2 hrs a shift SEEK MEDICAL CARE IF: You have swelling of your calf or leg.  You develop shortness of breath or chest pain.  You have redness, swelling, or increasing pain in the wound.  There is pus or any unusual drainage coming from the surgical site.  You notice a bad smell coming from the surgical site or dressing.  The surgical site breaks open after sutures or staples have been removed.  There is persistent bleeding from the suture or staple line.  You are getting worse or are not improving.  You have any other questions or concerns.  SEEK IMMEDIATE MEDICAL CARE IF:  You have a fever.  You develop a rash.  You have difficulty breathing.  You develop any reaction or side effects to medicines given.  Your knee motion is decreasing rather than improving.  MAKE SURE YOU:  Understand these instructions.  Will watch your condition.  Will get help right away if you are not doing well or get worse.     Do not put a pillow under the knee. Place it under the heel.    Complete by:  As directed   Place gray foam block, curve side up under heel at all times except when in CPM or when walking.  DO NOT modify, tear, cut, or change in any way the gray foam block.     Increase activity slowly as tolerated    Complete by:  As directed      TED hose    Complete by:  As directed   Use stockings (TED hose) for 2 weeks on both leg(s).  You may remove them at night for sleeping.           Follow-up  Information    Follow up with Nilda SimmerWAINER,ROBERT A, MD On 11/09/2014.   Specialty:  Orthopedic Surgery   Why:  appt time 2:30 pm   Contact information:   [address] [place] [address] [phone]       Follow up with [facility].   Why:  Someone from [facility] will contact you concerning start date and time for physical therapy.   Contact information:   [address] [place] [address] [phone]        Signed: Pascal LuxSHEPPERSON,Islah Eve J 10/28/2014, 8:15 PM

## 2014-10-29 DIAGNOSIS — M25561 Pain in right knee: Secondary | ICD-10-CM | POA: Diagnosis not present

## 2014-10-29 DIAGNOSIS — M1000 Idiopathic gout, unspecified site: Secondary | ICD-10-CM | POA: Diagnosis not present

## 2014-10-29 DIAGNOSIS — I129 Hypertensive chronic kidney disease with stage 1 through stage 4 chronic kidney disease, or unspecified chronic kidney disease: Secondary | ICD-10-CM | POA: Diagnosis not present

## 2014-10-29 DIAGNOSIS — Z96651 Presence of right artificial knee joint: Secondary | ICD-10-CM | POA: Diagnosis not present

## 2014-10-29 DIAGNOSIS — Z471 Aftercare following joint replacement surgery: Secondary | ICD-10-CM | POA: Diagnosis not present

## 2014-10-29 DIAGNOSIS — R2689 Other abnormalities of gait and mobility: Secondary | ICD-10-CM | POA: Diagnosis not present

## 2014-10-31 DIAGNOSIS — Z471 Aftercare following joint replacement surgery: Secondary | ICD-10-CM | POA: Diagnosis not present

## 2014-10-31 DIAGNOSIS — M1000 Idiopathic gout, unspecified site: Secondary | ICD-10-CM | POA: Diagnosis not present

## 2014-10-31 DIAGNOSIS — Z96651 Presence of right artificial knee joint: Secondary | ICD-10-CM | POA: Diagnosis not present

## 2014-10-31 DIAGNOSIS — R2689 Other abnormalities of gait and mobility: Secondary | ICD-10-CM | POA: Diagnosis not present

## 2014-10-31 DIAGNOSIS — I129 Hypertensive chronic kidney disease with stage 1 through stage 4 chronic kidney disease, or unspecified chronic kidney disease: Secondary | ICD-10-CM | POA: Diagnosis not present

## 2014-10-31 DIAGNOSIS — M25561 Pain in right knee: Secondary | ICD-10-CM | POA: Diagnosis not present

## 2014-11-02 DIAGNOSIS — Z96651 Presence of right artificial knee joint: Secondary | ICD-10-CM | POA: Diagnosis not present

## 2014-11-02 DIAGNOSIS — R2689 Other abnormalities of gait and mobility: Secondary | ICD-10-CM | POA: Diagnosis not present

## 2014-11-02 DIAGNOSIS — Z471 Aftercare following joint replacement surgery: Secondary | ICD-10-CM | POA: Diagnosis not present

## 2014-11-02 DIAGNOSIS — I129 Hypertensive chronic kidney disease with stage 1 through stage 4 chronic kidney disease, or unspecified chronic kidney disease: Secondary | ICD-10-CM | POA: Diagnosis not present

## 2014-11-02 DIAGNOSIS — M1000 Idiopathic gout, unspecified site: Secondary | ICD-10-CM | POA: Diagnosis not present

## 2014-11-02 DIAGNOSIS — M25561 Pain in right knee: Secondary | ICD-10-CM | POA: Diagnosis not present

## 2014-11-03 DIAGNOSIS — R2689 Other abnormalities of gait and mobility: Secondary | ICD-10-CM | POA: Diagnosis not present

## 2014-11-03 DIAGNOSIS — Z471 Aftercare following joint replacement surgery: Secondary | ICD-10-CM | POA: Diagnosis not present

## 2014-11-03 DIAGNOSIS — Z96651 Presence of right artificial knee joint: Secondary | ICD-10-CM | POA: Diagnosis not present

## 2014-11-03 DIAGNOSIS — M1000 Idiopathic gout, unspecified site: Secondary | ICD-10-CM | POA: Diagnosis not present

## 2014-11-03 DIAGNOSIS — M25561 Pain in right knee: Secondary | ICD-10-CM | POA: Diagnosis not present

## 2014-11-03 DIAGNOSIS — I129 Hypertensive chronic kidney disease with stage 1 through stage 4 chronic kidney disease, or unspecified chronic kidney disease: Secondary | ICD-10-CM | POA: Diagnosis not present

## 2014-11-09 DIAGNOSIS — R2689 Other abnormalities of gait and mobility: Secondary | ICD-10-CM | POA: Diagnosis not present

## 2014-11-09 DIAGNOSIS — M1000 Idiopathic gout, unspecified site: Secondary | ICD-10-CM | POA: Diagnosis not present

## 2014-11-09 DIAGNOSIS — Z471 Aftercare following joint replacement surgery: Secondary | ICD-10-CM | POA: Diagnosis not present

## 2014-11-09 DIAGNOSIS — Z96651 Presence of right artificial knee joint: Secondary | ICD-10-CM | POA: Diagnosis not present

## 2014-11-09 DIAGNOSIS — I129 Hypertensive chronic kidney disease with stage 1 through stage 4 chronic kidney disease, or unspecified chronic kidney disease: Secondary | ICD-10-CM | POA: Diagnosis not present

## 2014-11-09 DIAGNOSIS — M25561 Pain in right knee: Secondary | ICD-10-CM | POA: Diagnosis not present

## 2014-11-10 DIAGNOSIS — R2689 Other abnormalities of gait and mobility: Secondary | ICD-10-CM | POA: Diagnosis not present

## 2014-11-10 DIAGNOSIS — M1000 Idiopathic gout, unspecified site: Secondary | ICD-10-CM | POA: Diagnosis not present

## 2014-11-10 DIAGNOSIS — Z96651 Presence of right artificial knee joint: Secondary | ICD-10-CM | POA: Diagnosis not present

## 2014-11-10 DIAGNOSIS — I129 Hypertensive chronic kidney disease with stage 1 through stage 4 chronic kidney disease, or unspecified chronic kidney disease: Secondary | ICD-10-CM | POA: Diagnosis not present

## 2014-11-10 DIAGNOSIS — Z471 Aftercare following joint replacement surgery: Secondary | ICD-10-CM | POA: Diagnosis not present

## 2014-11-10 DIAGNOSIS — M25561 Pain in right knee: Secondary | ICD-10-CM | POA: Diagnosis not present

## 2014-11-12 DIAGNOSIS — Z471 Aftercare following joint replacement surgery: Secondary | ICD-10-CM | POA: Diagnosis not present

## 2014-11-12 DIAGNOSIS — Z96651 Presence of right artificial knee joint: Secondary | ICD-10-CM | POA: Diagnosis not present

## 2014-11-12 DIAGNOSIS — R2689 Other abnormalities of gait and mobility: Secondary | ICD-10-CM | POA: Diagnosis not present

## 2014-11-12 DIAGNOSIS — M1000 Idiopathic gout, unspecified site: Secondary | ICD-10-CM | POA: Diagnosis not present

## 2014-11-12 DIAGNOSIS — M25561 Pain in right knee: Secondary | ICD-10-CM | POA: Diagnosis not present

## 2014-11-12 DIAGNOSIS — I129 Hypertensive chronic kidney disease with stage 1 through stage 4 chronic kidney disease, or unspecified chronic kidney disease: Secondary | ICD-10-CM | POA: Diagnosis not present

## 2014-11-19 DIAGNOSIS — Z96651 Presence of right artificial knee joint: Secondary | ICD-10-CM | POA: Diagnosis not present

## 2014-11-19 DIAGNOSIS — M25661 Stiffness of right knee, not elsewhere classified: Secondary | ICD-10-CM | POA: Diagnosis not present

## 2014-11-19 DIAGNOSIS — R262 Difficulty in walking, not elsewhere classified: Secondary | ICD-10-CM | POA: Diagnosis not present

## 2014-11-19 DIAGNOSIS — M6281 Muscle weakness (generalized): Secondary | ICD-10-CM | POA: Diagnosis not present

## 2014-12-11 DIAGNOSIS — G894 Chronic pain syndrome: Secondary | ICD-10-CM | POA: Diagnosis not present

## 2014-12-11 DIAGNOSIS — Q615 Medullary cystic kidney: Secondary | ICD-10-CM | POA: Diagnosis not present

## 2014-12-11 DIAGNOSIS — I129 Hypertensive chronic kidney disease with stage 1 through stage 4 chronic kidney disease, or unspecified chronic kidney disease: Secondary | ICD-10-CM | POA: Diagnosis not present

## 2014-12-11 DIAGNOSIS — Z7409 Other reduced mobility: Secondary | ICD-10-CM | POA: Diagnosis not present

## 2014-12-11 DIAGNOSIS — N183 Chronic kidney disease, stage 3 (moderate): Secondary | ICD-10-CM | POA: Diagnosis not present

## 2014-12-24 DIAGNOSIS — T1501XA Foreign body in cornea, right eye, initial encounter: Secondary | ICD-10-CM | POA: Diagnosis not present

## 2015-03-05 DIAGNOSIS — D631 Anemia in chronic kidney disease: Secondary | ICD-10-CM | POA: Diagnosis not present

## 2015-03-05 DIAGNOSIS — I129 Hypertensive chronic kidney disease with stage 1 through stage 4 chronic kidney disease, or unspecified chronic kidney disease: Secondary | ICD-10-CM | POA: Diagnosis not present

## 2015-03-05 DIAGNOSIS — F17290 Nicotine dependence, other tobacco product, uncomplicated: Secondary | ICD-10-CM | POA: Diagnosis not present

## 2015-03-05 DIAGNOSIS — R7301 Impaired fasting glucose: Secondary | ICD-10-CM | POA: Diagnosis not present

## 2015-03-05 DIAGNOSIS — M1A30X1 Chronic gout due to renal impairment, unspecified site, with tophus (tophi): Secondary | ICD-10-CM | POA: Diagnosis not present

## 2015-03-05 DIAGNOSIS — E785 Hyperlipidemia, unspecified: Secondary | ICD-10-CM | POA: Diagnosis not present

## 2015-03-05 DIAGNOSIS — G894 Chronic pain syndrome: Secondary | ICD-10-CM | POA: Diagnosis not present

## 2015-05-03 ENCOUNTER — Other Ambulatory Visit: Payer: Self-pay

## 2015-05-03 MED ORDER — OXYCODONE-ACETAMINOPHEN 10-325 MG PO TABS: 1.0000 | ORAL_TABLET | Freq: Three times a day (TID) | ORAL | Status: DC | PRN

## 2015-05-03 MED ORDER — MORPHINE SULFATE ER 30 MG PO TBCR: 30.0000 mg | EXTENDED_RELEASE_TABLET | Freq: Two times a day (BID) | ORAL | Status: DC

## 2015-05-03 NOTE — Telephone Encounter (Signed)
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 I added them into his meds, but he had some similar ones that I did not want to take out until you had seen them. Looks like he got them from another provider back in Dec.

## 2015-05-04 DIAGNOSIS — Z87898 Personal history of other specified conditions: Secondary | ICD-10-CM | POA: Insufficient documentation

## 2015-05-04 DIAGNOSIS — E781 Pure hyperglyceridemia: Secondary | ICD-10-CM | POA: Insufficient documentation

## 2015-05-04 DIAGNOSIS — D649 Anemia, unspecified: Secondary | ICD-10-CM | POA: Insufficient documentation

## 2015-05-04 DIAGNOSIS — N183 Chronic kidney disease, stage 3 unspecified: Secondary | ICD-10-CM | POA: Insufficient documentation

## 2015-05-04 DIAGNOSIS — M109 Gout, unspecified: Secondary | ICD-10-CM | POA: Insufficient documentation

## 2015-05-07 ENCOUNTER — Encounter: Payer: Self-pay | Admitting: Family Medicine

## 2015-05-07 ENCOUNTER — Ambulatory Visit (INDEPENDENT_AMBULATORY_CARE_PROVIDER_SITE_OTHER): Payer: Medicare Other | Admitting: Family Medicine

## 2015-05-07 VITALS — BP 132/77 | HR 94 | Temp 99.1°F | Wt 233.0 lb

## 2015-05-07 DIAGNOSIS — M25511 Pain in right shoulder: Secondary | ICD-10-CM

## 2015-05-07 DIAGNOSIS — G8929 Other chronic pain: Secondary | ICD-10-CM | POA: Diagnosis not present

## 2015-05-07 NOTE — Progress Notes (Signed)
   BP 132/77 mmHg  Pulse 94  Temp(Src) 99.1 F (37.3 C)  Wt 233 lb (105.688 kg)  SpO2 98%   Subjective:    Patient ID: Nathan Ramos, male    DOB: 01/12/1971, 44 y.o.   MRN: [id number]  HPI: Nathan Ramos is a 44 y.o. male  Chief Complaint  Patient presents with  . Shoulder Pain    right shoulder, since before Christmas   He has been bothered with shoulder pain for over 6 months; saw Dr. Thurston Hole He needs a referral to Dr. Thurston Hole for this He is right-handed and it's the right shoulder Trying to raise the right arm feels like a catch He can't sleep on the right side Stiff in the mornings when he wakes up No weakness or numbness down the right arm He has tried ice and heat and it helps short-term but doesn't take the pain away; feels like a deep pain Throwing baseball with son did not help he jokes Trouble opening a jar, anything putting the elbow out or turning is hard  Relevant past medical, surgical, family and social history reviewed and updated as indicated. Interim medical history since our last visit reviewed. Allergies and medications reviewed and updated.  Review of Systems  Per HPI unless specifically indicated above     Objective:    BP 132/77 mmHg  Pulse 94  Temp(Src) 99.1 F (37.3 C)  Wt 233 lb (105.688 kg)  SpO2 98%  Wt Readings from Last 3 Encounters:  05/07/15 233 lb (105.688 kg)  03/05/15 234 lb (106.142 kg)  10/26/14 233 lb (105.688 kg)    Physical Exam  Constitutional: He appears well-developed and well-nourished.  Cardiovascular: Normal rate.   Pulmonary/Chest: Effort normal.  Musculoskeletal:       Right shoulder: He exhibits decreased range of motion, tenderness, bony tenderness, pain and decreased strength. He exhibits no swelling, no effusion, no crepitus and no deformity.  Neurological: No sensory deficit (no numbness or decreased sensation in the right hand).      Assessment & Plan:   Problem List Items Addressed This Visit    None     Visit Diagnoses    Chronic right shoulder pain    -  Primary    Relevant Orders    Ambulatory referral to Orthopedic Surgery       Follow up plan: Return if symptoms worsen or fail to improve.   An After Visit Summary was printed and given to the patient.

## 2015-05-07 NOTE — Patient Instructions (Signed)
We'll refer you to the orthopaedist about your shoulder You can certainly turmeric orally (spice form or pill form) as a natural anti-inflammatory Ice 20 minutes at a time through a t-shirt or thin towel three or four times a day

## 2015-05-19 ENCOUNTER — Other Ambulatory Visit: Payer: Self-pay | Admitting: Orthopedic Surgery

## 2015-05-19 DIAGNOSIS — M25511 Pain in right shoulder: Secondary | ICD-10-CM

## 2015-05-19 DIAGNOSIS — M75101 Unspecified rotator cuff tear or rupture of right shoulder, not specified as traumatic: Secondary | ICD-10-CM | POA: Diagnosis not present

## 2015-05-26 ENCOUNTER — Ambulatory Visit
Admission: RE | Admit: 2015-05-26 | Discharge: 2015-05-26 | Disposition: A | Discharge status: Home / Self Care | Payer: Medicare Other | Source: Ambulatory Visit | Attending: Orthopedic Surgery | Admitting: Orthopedic Surgery

## 2015-05-26 ENCOUNTER — Other Ambulatory Visit: Payer: Self-pay | Admitting: Orthopedic Surgery

## 2015-05-26 ENCOUNTER — Ambulatory Visit: Payer: Medicare Other

## 2015-05-26 DIAGNOSIS — M7551 Bursitis of right shoulder: Secondary | ICD-10-CM | POA: Insufficient documentation

## 2015-05-26 DIAGNOSIS — M19011 Primary osteoarthritis, right shoulder: Secondary | ICD-10-CM | POA: Insufficient documentation

## 2015-05-26 DIAGNOSIS — Z01818 Encounter for other preprocedural examination: Secondary | ICD-10-CM | POA: Diagnosis not present

## 2015-05-26 DIAGNOSIS — X58XXXA Exposure to other specified factors, initial encounter: Secondary | ICD-10-CM | POA: Diagnosis not present

## 2015-05-26 DIAGNOSIS — M25511 Pain in right shoulder: Secondary | ICD-10-CM

## 2015-05-26 DIAGNOSIS — T1590XA Foreign body on external eye, part unspecified, unspecified eye, initial encounter: Secondary | ICD-10-CM

## 2015-05-26 DIAGNOSIS — S46811A Strain of other muscles, fascia and tendons at shoulder and upper arm level, right arm, initial encounter: Secondary | ICD-10-CM | POA: Diagnosis not present

## 2015-05-31 ENCOUNTER — Ambulatory Visit: Payer: Medicare Other | Admitting: Family Medicine

## 2015-06-02 ENCOUNTER — Encounter: Payer: Self-pay | Admitting: Family Medicine

## 2015-06-02 ENCOUNTER — Ambulatory Visit (INDEPENDENT_AMBULATORY_CARE_PROVIDER_SITE_OTHER): Payer: Medicare Other | Admitting: Family Medicine

## 2015-06-02 ENCOUNTER — Other Ambulatory Visit: Payer: Self-pay | Admitting: Family Medicine

## 2015-06-02 VITALS — BP 132/83 | HR 71 | Temp 98.1°F | Wt 225.0 lb

## 2015-06-02 DIAGNOSIS — F1120 Opioid dependence, uncomplicated: Secondary | ICD-10-CM | POA: Diagnosis not present

## 2015-06-02 DIAGNOSIS — D638 Anemia in other chronic diseases classified elsewhere: Secondary | ICD-10-CM

## 2015-06-02 DIAGNOSIS — M1009 Idiopathic gout, multiple sites: Secondary | ICD-10-CM | POA: Diagnosis not present

## 2015-06-02 DIAGNOSIS — E785 Hyperlipidemia, unspecified: Secondary | ICD-10-CM

## 2015-06-02 DIAGNOSIS — N2889 Other specified disorders of kidney and ureter: Secondary | ICD-10-CM

## 2015-06-02 DIAGNOSIS — R7301 Impaired fasting glucose: Secondary | ICD-10-CM

## 2015-06-02 DIAGNOSIS — I151 Hypertension secondary to other renal disorders: Secondary | ICD-10-CM | POA: Diagnosis not present

## 2015-06-02 DIAGNOSIS — G8929 Other chronic pain: Secondary | ICD-10-CM | POA: Diagnosis not present

## 2015-06-02 DIAGNOSIS — M109 Gout, unspecified: Secondary | ICD-10-CM

## 2015-06-02 DIAGNOSIS — Z79891 Long term (current) use of opiate analgesic: Secondary | ICD-10-CM

## 2015-06-02 MED ORDER — OXYCODONE-ACETAMINOPHEN 10-325 MG PO TABS: 1.0000 | ORAL_TABLET | Freq: Three times a day (TID) | ORAL | Status: DC | PRN

## 2015-06-02 MED ORDER — MORPHINE SULFATE ER 30 MG PO TBCR: 30.0000 mg | EXTENDED_RELEASE_TABLET | Freq: Two times a day (BID) | ORAL | Status: DC

## 2015-06-02 NOTE — Patient Instructions (Addendum)
Please have fasting labs on or after July 23rd (kidneys, liver, cholesterol, etc.) When you come in, just ask the lab staff to send copies to Dr. Thedore MinsSingh Please do ask for a prescription of naloxone at the pharmacy (available now without a prescription), and let the pharmacist teach you and your family and always keep it available Return in 12 weeks

## 2015-06-02 NOTE — Assessment & Plan Note (Signed)
Controlled substance agreement signed today (new system); refills provided; UDS collected today; no concerns for misuse or diversion; regular follow-up every 3 months

## 2015-06-02 NOTE — Assessment & Plan Note (Signed)
Check fasting lipids next week; limit saturated fats

## 2015-06-02 NOTE — Progress Notes (Signed)
BP 132/83 mmHg  Pulse 71  Temp(Src) 98.1 F (36.7 C)  Wt 225 lb (102.059 kg)  SpO2 96%   Subjective:    Patient ID: Nathan Ramos, male    DOB: 12/05/1970, 44 y.o.   MRN: [id number]  HPI: [name] is a 44 y.o. male  Chief Complaint  Patient presents with  . Pain    chronic pain follow up   Since last visit, has seen orthopaedist and he is going to have arthroscopic surgery; Dr. Thurston Hole in [place] through [facility]; rotator cuff tear x 2; right-handed and it's the right shoulder; he is limiting his activity; will be day surgery, will be out of work x 1 week, then rehab x 6 weeks  Gout; nephrologist manages that; last flare was 3 months ago; last time was hand; sometimes elbows; limiting foods  Lost 10 pounds, eating better, fruits and veggies  Kidney disease; Dr. Thedore Mins sees him, something has come up for his last visit; due for labs with him  Chronic pain; going on for years from gout; best pain level down to a 2 or 3 out of 10; ankles will ache at times and really acted up not long ago; couldn't walk on it not long ago; aggravated, not sure if from walking; not red or hot; worst pain gets up to an 8 out of 10 The pain medicine does not make him feel loopy or goofy or overmedicated; last dose of each pill was this morning 8:30 am; no constipation; keeps medicine locked up; no alcohol, none since age 61  High triglycerides; does not eat bacon for breakfast any more; moderation; less red meat than before, more chicken; grilling and baking, not fried  Relevant past medical, surgical, family and social history reviewed and updated as indicated. Interim medical history since our last visit reviewed. Allergies and medications reviewed and updated.  Review of Systems  Per HPI unless specifically indicated above     Objective:    BP 132/83 mmHg  Pulse 71  Temp(Src) 98.1 F (36.7 C)  Wt 225 lb (102.059 kg)  SpO2 96%  Wt Readings from Last 3 Encounters:  06/02/15 225 lb  (102.059 kg)  05/07/15 233 lb (105.688 kg)  03/05/15 234 lb (106.142 kg)    Physical Exam  Results for orders placed or performed during the hospital encounter of 10/26/14  CBC  Result Value Ref Range   WBC 15.2 (H) 4.0 - 10.5 K/uL   RBC 3.71 (L) 4.22 - 5.81 MIL/uL   Hemoglobin 10.0 (L) 13.0 - 17.0 g/dL   HCT 13.2 (L) 44.0 - 10.2 %   MCV 84.4 78.0 - 100.0 fL   MCH 27.0 26.0 - 34.0 pg   MCHC 31.9 30.0 - 36.0 g/dL   RDW 72.5 36.6 - 44.0 %   Platelets 292 150 - 400 K/uL  Creatinine, serum  Result Value Ref Range   Creatinine, Ser 2.51 (H) 0.50 - 1.35 mg/dL   GFR calc non Af Amer 30 (L) >90 mL/min   GFR calc Af Amer 35 (L) >90 mL/min  CBC  Result Value Ref Range   WBC 15.3 (H) 4.0 - 10.5 K/uL   RBC 3.28 (L) 4.22 - 5.81 MIL/uL   Hemoglobin 9.0 (L) 13.0 - 17.0 g/dL   HCT 34.7 (L) 42.5 - 95.6 %   MCV 84.5 78.0 - 100.0 fL   MCH 27.4 26.0 - 34.0 pg   MCHC 32.5 30.0 - 36.0 g/dL   RDW 38.7 56.4 -  15.5 %   Platelets 246 150 - 400 K/uL  Basic metabolic panel  Result Value Ref Range   Sodium 137 137 - 147 mEq/L   Potassium 5.1 3.7 - 5.3 mEq/L   Chloride 103 96 - 112 mEq/L   CO2 18 (L) 19 - 32 mEq/L   Glucose, Bld 178 (H) 70 - 99 mg/dL   BUN 59 (H) 6 - 23 mg/dL   Creatinine, Ser 1.612.35 (H) 0.50 - 1.35 mg/dL   Calcium 9.1 8.4 - 09.610.5 mg/dL   GFR calc non Af Amer 33 (L) >90 mL/min   GFR calc Af Amer 38 (L) >90 mL/min   Anion gap 16 (H) 5 - 15      Assessment & Plan:   Problem List Items Addressed This Visit      Cardiovascular and Mediastinum   Hypertension    Fair control today; limit salt, continue weight loss        Endocrine   IFG (impaired fasting glucose)    Pleased with his weight loss; check A1C next week        Musculoskeletal and Integument   Gouty arthritis    Check uric acid; so glad he has lost weight; avoid foods rich in purines      Relevant Medications   morphine (MS CONTIN) 30 MG 12 hr tablet   oxyCODONE-acetaminophen (PERCOCET) 10-325 MG per  tablet     Other   Chronic pain    Controlled substance agreement signed today (new system); refills provided; UDS collected today; no concerns for misuse or diversion; regular follow-up every 3 months      Relevant Medications   morphine (MS CONTIN) 30 MG 12 hr tablet   oxyCODONE-acetaminophen (PERCOCET) 10-325 MG per tablet   Anemia    Last CBC in April reviewed; check with next labs      Hyperlipidemia - Primary    Check fasting lipids next week; limit saturated fats       Other Visit Diagnoses    Uncomplicated opioid dependence           Follow up plan: Return in about 12 weeks (around 08/25/2015) for multiple health issues.

## 2015-06-02 NOTE — Assessment & Plan Note (Signed)
Check uric acid; so glad he has lost weight; avoid foods rich in purines

## 2015-06-02 NOTE — Assessment & Plan Note (Signed)
Fair control today; limit salt, continue weight loss

## 2015-06-02 NOTE — Assessment & Plan Note (Signed)
Last CBC in April reviewed; check with next labs

## 2015-06-02 NOTE — Assessment & Plan Note (Signed)
Pleased with his weight loss; check A1C next week

## 2015-06-09 LAB — TOXASSURE SELECT 13 (MW), URINE: PDF: 0

## 2015-07-05 ENCOUNTER — Other Ambulatory Visit: Payer: Self-pay | Admitting: Family Medicine

## 2015-07-05 ENCOUNTER — Other Ambulatory Visit: Payer: Medicare Other

## 2015-07-05 DIAGNOSIS — D638 Anemia in other chronic diseases classified elsewhere: Secondary | ICD-10-CM | POA: Diagnosis not present

## 2015-07-05 DIAGNOSIS — M1030 Gout due to renal impairment, unspecified site: Secondary | ICD-10-CM

## 2015-07-05 DIAGNOSIS — N189 Chronic kidney disease, unspecified: Secondary | ICD-10-CM | POA: Diagnosis not present

## 2015-07-05 DIAGNOSIS — I151 Hypertension secondary to other renal disorders: Secondary | ICD-10-CM | POA: Diagnosis not present

## 2015-07-05 DIAGNOSIS — E785 Hyperlipidemia, unspecified: Secondary | ICD-10-CM

## 2015-07-05 DIAGNOSIS — N2889 Other specified disorders of kidney and ureter: Secondary | ICD-10-CM | POA: Diagnosis not present

## 2015-07-05 DIAGNOSIS — R7301 Impaired fasting glucose: Secondary | ICD-10-CM

## 2015-07-05 MED ORDER — OXYCODONE-ACETAMINOPHEN 10-325 MG PO TABS: 1.0000 | ORAL_TABLET | Freq: Three times a day (TID) | ORAL | Status: DC | PRN

## 2015-07-05 MED ORDER — MORPHINE SULFATE ER 30 MG PO TBCR: 30.0000 mg | EXTENDED_RELEASE_TABLET | Freq: Two times a day (BID) | ORAL | Status: DC

## 2015-07-05 NOTE — Telephone Encounter (Signed)
Pt is due for his 28 day meds as of Friday.  Can we get them ready by 9:00?  He is coming for labs this morning.

## 2015-07-06 LAB — COMPREHENSIVE METABOLIC PANEL
ALT: 17 IU/L (ref 0–44)
AST: 22 IU/L (ref 0–40)
Albumin/Globulin Ratio: 1.5 (ref 1.1–2.5)
Albumin: 4.2 g/dL (ref 3.5–5.5)
Alkaline Phosphatase: 96 IU/L (ref 39–117)
BUN/Creatinine Ratio: 21 — ABNORMAL HIGH (ref 9–20)
BUN: 45 mg/dL — ABNORMAL HIGH (ref 6–24)
Bilirubin Total: 0.2 mg/dL (ref 0.0–1.2)
CO2: 20 mmol/L (ref 18–29)
Calcium: 9.6 mg/dL (ref 8.7–10.2)
Chloride: 104 mmol/L (ref 97–108)
Creatinine, Ser: 2.17 mg/dL — ABNORMAL HIGH (ref 0.76–1.27)
GFR calc Af Amer: 42 mL/min/{1.73_m2} — ABNORMAL LOW (ref >59)
GFR calc non Af Amer: 36 mL/min/{1.73_m2} — ABNORMAL LOW (ref >59)
Globulin, Total: 2.8 g/dL (ref 1.5–4.5)
Glucose: 93 mg/dL (ref 65–99)
Potassium: 4.7 mmol/L (ref 3.5–5.2)
Sodium: 145 mmol/L — ABNORMAL HIGH (ref 134–144)
Total Protein: 7 g/dL (ref 6.0–8.5)

## 2015-07-06 LAB — CBC WITH DIFFERENTIAL/PLATELET
Basophils Absolute: 0 10*3/uL (ref 0.0–0.2)
Basos: 0 %
EOS (ABSOLUTE): 0.3 10*3/uL (ref 0.0–0.4)
Eos: 4 %
Hematocrit: 35.1 % — ABNORMAL LOW (ref 37.5–51.0)
Hemoglobin: 12.1 g/dL — ABNORMAL LOW (ref 12.6–17.7)
Immature Grans (Abs): 0 10*3/uL (ref 0.0–0.1)
Immature Granulocytes: 0 %
Lymphocytes Absolute: 3.2 10*3/uL — ABNORMAL HIGH (ref 0.7–3.1)
Lymphs: 46 %
MCH: 29.2 pg (ref 26.6–33.0)
MCHC: 34.5 g/dL (ref 31.5–35.7)
MCV: 85 fL (ref 79–97)
Monocytes Absolute: 0.5 10*3/uL (ref 0.1–0.9)
Monocytes: 8 %
Neutrophils Absolute: 2.9 10*3/uL (ref 1.4–7.0)
Neutrophils: 42 %
Platelets: 261 10*3/uL (ref 150–379)
RBC: 4.15 x10E6/uL (ref 4.14–5.80)
RDW: 14.4 % (ref 12.3–15.4)
WBC: 6.9 10*3/uL (ref 3.4–10.8)

## 2015-07-06 LAB — HGB A1C W/O EAG: Hgb A1c MFr Bld: 5.6 % (ref 4.8–5.6)

## 2015-07-06 LAB — LIPID PANEL W/O CHOL/HDL RATIO
Cholesterol, Total: 188 mg/dL (ref 100–199)
HDL: 22 mg/dL — ABNORMAL LOW (ref >39)
Triglycerides: 539 mg/dL — ABNORMAL HIGH (ref 0–149)

## 2015-07-06 LAB — URIC ACID: Uric Acid: 9.9 mg/dL — ABNORMAL HIGH (ref 3.7–8.6)

## 2015-07-12 ENCOUNTER — Encounter: Payer: Self-pay | Admitting: Family Medicine

## 2015-07-30 ENCOUNTER — Other Ambulatory Visit: Payer: Self-pay | Admitting: Family Medicine

## 2015-07-30 NOTE — Telephone Encounter (Signed)
Please ask him to get this from his nephrologist

## 2015-08-02 ENCOUNTER — Other Ambulatory Visit: Payer: Self-pay | Admitting: Family Medicine

## 2015-08-02 MED ORDER — FEBUXOSTAT 40 MG PO TABS: 40.0000 mg | ORAL_TABLET | Freq: Every day | ORAL | Status: DC

## 2015-08-03 ENCOUNTER — Other Ambulatory Visit: Payer: Self-pay | Admitting: Family Medicine

## 2015-08-03 MED ORDER — MORPHINE SULFATE ER 30 MG PO TBCR: 30.0000 mg | EXTENDED_RELEASE_TABLET | Freq: Two times a day (BID) | ORAL | Status: DC

## 2015-08-03 MED ORDER — OXYCODONE-ACETAMINOPHEN 10-325 MG PO TABS: 1.0000 | ORAL_TABLET | Freq: Three times a day (TID) | ORAL | Status: DC | PRN

## 2015-08-03 NOTE — Telephone Encounter (Signed)
Spoke with patient, Dr. Sherie Don spoke with him last night and decided to go ahead and refill it.

## 2015-08-03 NOTE — Telephone Encounter (Signed)
Left message to call.

## 2015-08-03 NOTE — Telephone Encounter (Signed)
Rxs ready for pick-up; patient coming by today Appt October 12th

## 2015-08-25 ENCOUNTER — Encounter: Payer: Self-pay | Admitting: Family Medicine

## 2015-08-25 ENCOUNTER — Ambulatory Visit (INDEPENDENT_AMBULATORY_CARE_PROVIDER_SITE_OTHER): Payer: Medicare Other | Admitting: Family Medicine

## 2015-08-25 VITALS — BP 129/83 | HR 87 | Temp 97.9°F | Ht 68.0 in | Wt 231.0 lb

## 2015-08-25 DIAGNOSIS — G8929 Other chronic pain: Secondary | ICD-10-CM

## 2015-08-25 DIAGNOSIS — J069 Acute upper respiratory infection, unspecified: Secondary | ICD-10-CM | POA: Diagnosis not present

## 2015-08-25 MED ORDER — MORPHINE SULFATE ER 30 MG PO TBCR: 30.0000 mg | EXTENDED_RELEASE_TABLET | Freq: Two times a day (BID) | ORAL | Status: DC

## 2015-08-25 MED ORDER — OXYCODONE-ACETAMINOPHEN 10-325 MG PO TABS: 1.0000 | ORAL_TABLET | Freq: Three times a day (TID) | ORAL | Status: DC | PRN

## 2015-08-25 NOTE — Progress Notes (Signed)
BP 129/83 mmHg  Pulse 87  Temp(Src) 97.9 F (36.6 C)  Ht 5\' 8"  (1.727 m)  Wt 231 lb (104.781 kg)  BMI 35.13 kg/m2  SpO2 97%   Subjective:    Patient ID: Nathan Ramos, male    DOB: 10/29/1971, 44 y.o.   MRN: [id number]  HPI: Nathan Ramos is a 44 y.o. male  Chief Complaint  Patient presents with  . Chronic Pain    med follow up and refill   He thinks he has URI; coughing and phlegm and drainage; started a week ago; never ran a fever; coughing up phlegm; bringing up a fair amount, mostly in the mornings; smoker, cut down from before; smoking less than 1/2 ppd; smokes to help deal with stress when I asked why he doesn't quit; he has tried tussin CF, tussin DM, brought up some phlegm, cough is better; no coughing fits; worse when walking from outside to inside cold air; sinuses stuffy; either stopped up or draining; no ear problems; no sore throat; no rash; no travel; no body aches; only aches are where from pain and ganglion  Ganglion cyst over the extensor surface of the right wrist; they told him he could put pressure on it to help relieve the pain; limited ROM; he will call the orthopaedist he sees for the right rotator cuff; he did not need a referral he says; he got a cortisone shot in the right shoulder within the last month  Chronic pain; the medicine helps control his pain without causing loopiness or feeling overmedicated; no problems with constipation; he does not drink any alcohol; no nerve pills or sleeping pills; he safe-guards his medicines  He asked about gingko-biloba; his wife bought some and it has green tea extract  Relevant past medical, surgical, family and social history reviewed and updated as indicated. Interim medical history since our last visit reviewed. Allergies and medications reviewed and updated.  Review of Systems  Per HPI unless specifically indicated above     Objective:    BP 129/83 mmHg  Pulse 87  Temp(Src) 97.9 F (36.6 C)  Ht 5\' 8"   (1.727 m)  Wt 231 lb (104.781 kg)  BMI 35.13 kg/m2  SpO2 97%  Wt Readings from Last 3 Encounters:  08/25/15 231 lb (104.781 kg)  06/02/15 225 lb (102.059 kg)  05/07/15 233 lb (105.688 kg)    Physical Exam  Constitutional: He appears well-developed and well-nourished. No distress.  HENT:  Head: Normocephalic and atraumatic.  Eyes: EOM are normal. No scleral icterus.  Neck: No thyromegaly present.  Cardiovascular: Normal rate and regular rhythm.   Pulmonary/Chest: Effort normal and breath sounds normal.  Abdominal: Soft. Bowel sounds are normal. He exhibits no distension.  Musculoskeletal: He exhibits no edema.       Right wrist: He exhibits deformity (cystic swelling over the dorsal aspect of right wrist and hand).  Neurological: Coordination normal.  Skin: Skin is warm and dry. No pallor.  Psychiatric: He has a normal mood and affect. His behavior is normal. Judgment and thought content normal.      Assessment & Plan:   Problem List Items Addressed This Visit      Other   Chronic pain - Primary    Urine drug screen per protocol; counseling given again about importance of safeguarding medicine, not mixing with any other pain medicine, alcohol, anxiety medicine, etc; okay for refills prior to next appt if UDS is appropriate; no concern at this time for misuse or  diversion      Relevant Medications   morphine (MS CONTIN) 30 MG 12 hr tablet   oxyCODONE-acetaminophen (PERCOCET) 10-325 MG tablet    Other Visit Diagnoses    Viral URI        explained viral; no need for antibiotics; supportive care, symptomatic treatment       Follow up plan: Return in about 3 months (around 11/29/2015) for follow-up.  An after-visit summary was printed and given to the patient at check-out.  Please see the patient instructions which may contain other information and recommendations beyond what is mentioned above in the assessment and plan.

## 2015-08-25 NOTE — Assessment & Plan Note (Addendum)
Urine drug screen per protocol; counseling given again about importance of safeguarding medicine, not mixing with any other pain medicine, alcohol, anxiety medicine, etc; okay for refills prior to next appt if UDS is appropriate; no concern at this time for misuse or diversion

## 2015-08-25 NOTE — Patient Instructions (Addendum)
Please do call your orthopaedist about the ganglion cyst Avoid detox cleansing Okay to try probiotics or yogurt for a month to see if that helps Try to get 38 or more grams of fiber Do safeguard your medicines; if they are lost, stolen, or destroyed (even with a police report), I am not obligated to replace them or allow early refills Return in January (around the 16th) for next medication monitoring Try vitamin C (orange juice if not diabetic or vitamin C tablets) to help your immune system during your illness Get plenty of rest and hydration

## 2015-09-30 ENCOUNTER — Other Ambulatory Visit: Payer: Self-pay | Admitting: Family Medicine

## 2015-09-30 MED ORDER — OXYCODONE-ACETAMINOPHEN 10-325 MG PO TABS: 1.0000 | ORAL_TABLET | Freq: Three times a day (TID) | ORAL | Status: DC | PRN

## 2015-09-30 MED ORDER — MORPHINE SULFATE ER 30 MG PO TBCR: 30.0000 mg | EXTENDED_RELEASE_TABLET | Freq: Two times a day (BID) | ORAL | Status: DC

## 2015-09-30 NOTE — Progress Notes (Signed)
Rxs printed off, ready to be picked up here in the office

## 2015-10-08 ENCOUNTER — Other Ambulatory Visit: Payer: Self-pay | Admitting: Family Medicine

## 2015-10-11 NOTE — Telephone Encounter (Signed)
Dr. Lada patient, routing to provider. 

## 2015-10-28 ENCOUNTER — Other Ambulatory Visit: Payer: Self-pay | Admitting: Family Medicine

## 2015-10-28 NOTE — Telephone Encounter (Signed)
Routing to provider  

## 2015-10-28 NOTE — Telephone Encounter (Signed)
Pt called in regards to refills on Percocet and morphine. Please call pt when RX's are ready for pick up. Thanks.

## 2015-10-29 MED ORDER — MORPHINE SULFATE ER 30 MG PO TBCR: 30.0000 mg | EXTENDED_RELEASE_TABLET | Freq: Two times a day (BID) | ORAL | Status: DC

## 2015-10-29 MED ORDER — OXYCODONE-ACETAMINOPHEN 10-325 MG PO TABS: 1.0000 | ORAL_TABLET | Freq: Three times a day (TID) | ORAL | Status: DC | PRN

## 2015-10-29 NOTE — Telephone Encounter (Signed)
It's after hours on Friday afternoon He needs this Sunday This is completely on me to not have this ready during business hours I called patient I will take the Rxs [place] [facility] [place] my out tonight Patient was appreciative

## 2015-11-25 ENCOUNTER — Ambulatory Visit: Payer: Medicare Other | Admitting: Family Medicine

## 2015-11-26 ENCOUNTER — Encounter: Payer: Self-pay | Admitting: Family Medicine

## 2015-11-26 ENCOUNTER — Ambulatory Visit (INDEPENDENT_AMBULATORY_CARE_PROVIDER_SITE_OTHER): Payer: Medicare Other | Admitting: Family Medicine

## 2015-11-26 VITALS — BP 132/82 | HR 87 | Temp 97.8°F | Ht 67.75 in | Wt 232.0 lb

## 2015-11-26 DIAGNOSIS — M25531 Pain in right wrist: Secondary | ICD-10-CM | POA: Diagnosis not present

## 2015-11-26 DIAGNOSIS — R7301 Impaired fasting glucose: Secondary | ICD-10-CM

## 2015-11-26 DIAGNOSIS — G8929 Other chronic pain: Secondary | ICD-10-CM | POA: Diagnosis not present

## 2015-11-26 DIAGNOSIS — M1030 Gout due to renal impairment, unspecified site: Secondary | ICD-10-CM | POA: Diagnosis not present

## 2015-11-26 DIAGNOSIS — N189 Chronic kidney disease, unspecified: Secondary | ICD-10-CM

## 2015-11-26 DIAGNOSIS — D631 Anemia in chronic kidney disease: Secondary | ICD-10-CM | POA: Diagnosis not present

## 2015-11-26 DIAGNOSIS — I151 Hypertension secondary to other renal disorders: Secondary | ICD-10-CM

## 2015-11-26 DIAGNOSIS — Z79899 Other long term (current) drug therapy: Secondary | ICD-10-CM | POA: Insufficient documentation

## 2015-11-26 DIAGNOSIS — N2889 Other specified disorders of kidney and ureter: Secondary | ICD-10-CM

## 2015-11-26 DIAGNOSIS — N183 Chronic kidney disease, stage 3 (moderate): Secondary | ICD-10-CM

## 2015-11-26 DIAGNOSIS — Q615 Medullary cystic kidney: Secondary | ICD-10-CM

## 2015-11-26 DIAGNOSIS — E785 Hyperlipidemia, unspecified: Secondary | ICD-10-CM | POA: Diagnosis not present

## 2015-11-26 DIAGNOSIS — Z5181 Encounter for therapeutic drug level monitoring: Secondary | ICD-10-CM | POA: Diagnosis not present

## 2015-11-26 MED ORDER — OXYCODONE-ACETAMINOPHEN 10-325 MG PO TABS: 1.0000 | ORAL_TABLET | Freq: Three times a day (TID) | ORAL | Status: DC | PRN

## 2015-11-26 MED ORDER — MORPHINE SULFATE ER 30 MG PO TBCR: 30.0000 mg | EXTENDED_RELEASE_TABLET | Freq: Two times a day (BID) | ORAL | Status: DC

## 2015-11-26 NOTE — Assessment & Plan Note (Signed)
Sees nephrologist, CKD monitored

## 2015-11-26 NOTE — Assessment & Plan Note (Addendum)
UDS done today; will plan to start taper at next visit; discussed this with patient; refill provided today; no concern at this time for misuse or diversion, just tapering to get down to lower dose of medicine now that he is through his knee replacements

## 2015-11-26 NOTE — Assessment & Plan Note (Signed)
Cannot take NSAIDs at all because of his CKD; that is why we are using narcotics for pain; check creatinine; patient encouraged to see nephrologist

## 2015-11-26 NOTE — Assessment & Plan Note (Signed)
Check lipids 

## 2015-11-26 NOTE — Assessment & Plan Note (Signed)
DASH guidelines, patient encouraged to lose ten pounds over the next 3 months

## 2015-11-26 NOTE — Assessment & Plan Note (Signed)
Check CBC today.  

## 2015-11-26 NOTE — Patient Instructions (Addendum)
I do recommend yearly flu shots; for individuals who don't want flu shots, try to practice excellent hand hygiene, and avoid nursing homes, day cares, and hospitals during peak flu season; taking additional vitamin C daily during flu/cold season may help boost your immune system too Please do get in to see the orthopaedist in the next 3-4 weeks We'll contact you about the labs Return to see me in one month and we'll consider weaning your medicine at that visit

## 2015-11-26 NOTE — Assessment & Plan Note (Signed)
Check glucose and a1c, weight loss

## 2015-11-26 NOTE — Progress Notes (Signed)
BP 132/82 mmHg  Pulse 87  Temp(Src) 97.8 F (36.6 C)  Ht 5' 7.75" (1.721 m)  Wt 232 lb (105.235 kg)  BMI 35.53 kg/m2  SpO2 97%   Subjective:    Patient ID: Nathan Ramos, male    DOB: [birth year], 45 y.o.   MRN: [id number]  HPI: Nathan Ramos is a 45 y.o. male  Chief Complaint  Patient presents with  . Pain    3 month follow up for chronic pain   No medical excitement since last visit He does not want the flu shot  He has chronic pain; his right wrist has been giving him fits; thinks it is arthritis; makes his fingers cramp up; the 4th finger will cramp up and actually lock and it pops open; that is the only finger that doesn't have gout in it; no old injury; hurts all the way up into the proximal right forearm; he is right-handed; it hurts, but was not able to give me a quality even when I offered names; in the morning, pain gets up to a 7 out of 10 pain; worse with cold weather; horrible this past week; he was referred to orthopaedist but patient has not been able to go; down to one car; other pain is located in elbows, shoulders; knees do not hurt him that much any more; 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 Thurston Hole saw him for the right shoulder pain and got injection and that helped a lot; rotator cuff problem with a bone spur  He has not been to see his kidney doctor in a while; he does not take any non-steroidals at all  He has gout; he tries to stay away from [place] and gravy; he tried the cherry thing when he first got gout but it did not help  High triglycerides; did not fast for today's appt, and to  his defense today was really just pain management but we'll get labs today; not many fried foods; drinks sugary drinks; not much pasta; not much bread; he eats meat  Relevant past medical, surgical, family and social history reviewed and updated as indicated. No gout in the family that he knows of; no known high TG in family Interim medical history since our last visit reviewed. Allergies and medications reviewed and updated.  Review of Systems Per HPI unless specifically indicated above     Objective:    BP 132/82 mmHg  Pulse 87  Temp(Src) 97.8 F (36.6 C)  Ht 5' 7.75" (1.721 m)  Wt 232 lb (105.235 kg)  BMI 35.53 kg/m2  SpO2 97%  Wt Readings from Last 3 Encounters:  11/26/15 232 lb (105.235 kg)  08/25/15 231 lb (104.781 kg)  06/02/15 225 lb (102.059 kg)    Physical Exam  Constitutional: He appears well-developed and well-nourished. No distress.  Obese; weight relatively stable  HENT:  Head: Normocephalic and atraumatic.  Eyes: EOM are normal. No scleral icterus.  Neck: No thyromegaly present.  Cardiovascular: Normal rate and regular rhythm.   Pulmonary/Chest: Effort normal and breath sounds normal.  Abdominal: Soft. Bowel sounds are  normal. He exhibits no distension.  Musculoskeletal: He exhibits no edema.       Right wrist: He exhibits deformity (cystic swelling over the dorsal aspect of right wrist and hand, slightly decreased since last visit).  Neurological: Coordination normal.  Skin: Skin is warm and dry. No pallor.  Psychiatric: He has a normal mood and affect. His behavior is normal. Judgment and thought content normal.      Assessment & Plan:   Problem List Items Addressed This Visit      Cardiovascular and Mediastinum   Hypertension    DASH guidelines, patient encouraged to lose ten pounds over the next 3 months        Endocrine   IFG (impaired fasting glucose)    Check glucose and a1c, weight loss      Relevant Orders   Hgb A1c w/o eAG  (Completed)     Genitourinary   Medullary cystic disease of the kidney    Sees nephrologist, CKD monitored      CKD (chronic kidney disease)    Cannot take NSAIDs at all because of his CKD; that is why we are using narcotics for pain; check creatinine; patient encouraged to see nephrologist        Other   Chronic pain - Primary    UDS done today; will plan to start taper at next visit; discussed this with patient; refill provided today; no concern at this time for misuse or diversion, just tapering to get down to lower dose of medicine now that he is through his knee replacements      Relevant Medications   morphine (MS CONTIN) 30 MG 12 hr tablet   oxyCODONE-acetaminophen (PERCOCET) 10-325 MG tablet   Other Relevant Orders   ToxASSURE Select 13 (MW), Urine (Completed)   Gout due to renal impairment    Check uric acid today; avoid gravies and turkeys      Relevant Orders   Uric acid (Completed)   Anemia    Check CBC today      Relevant Orders   CBC with Differential/Platelet (Completed)   Hyperlipidemia    Check lipids      Relevant Orders   Lipid Panel w/o Chol/HDL Ratio (Completed)   Wrist pain, right    Patient to see his orthopaedist about this problem soon      Medication monitoring encounter   Relevant Orders   Comprehensive metabolic panel (Completed)      Follow up plan: Return in about 4 weeks (around 12/27/2015) for medication management.  Orders Placed This Encounter  Procedures  . ToxASSURE Select 13 (MW), Urine  . CBC with Differential/Platelet  . Lipid Panel w/o Chol/HDL Ratio  . Comprehensive metabolic panel  . Hgb A1c w/o eAG  . Uric acid  . Specimen status report   An after-visit summary was printed and given to the patient at check-out.  Please see the patient instructions which may contain other information and recommendations beyond what is mentioned above in the assessment and plan.

## 2015-11-26 NOTE — Assessment & Plan Note (Signed)
Check uric acid today; avoid gravies and turkeys

## 2015-11-27 ENCOUNTER — Encounter: Payer: Self-pay | Admitting: Family Medicine

## 2015-11-27 ENCOUNTER — Telehealth: Payer: Self-pay | Admitting: Family Medicine

## 2015-11-27 LAB — LIPID PANEL W/O CHOL/HDL RATIO
Cholesterol, Total: 187 mg/dL (ref 100–199)
HDL: 23 mg/dL — ABNORMAL LOW (ref >39)
Triglycerides: 538 mg/dL — ABNORMAL HIGH (ref 0–149)

## 2015-11-27 LAB — CBC WITH DIFFERENTIAL/PLATELET
Basophils Absolute: 0 10*3/uL (ref 0.0–0.2)
Basos: 1 %
EOS (ABSOLUTE): 0.3 10*3/uL (ref 0.0–0.4)
Eos: 4 %
Hematocrit: 34.6 % — ABNORMAL LOW (ref 37.5–51.0)
Hemoglobin: 11.7 g/dL — ABNORMAL LOW (ref 12.6–17.7)
Immature Grans (Abs): 0 10*3/uL (ref 0.0–0.1)
Immature Granulocytes: 0 %
Lymphocytes Absolute: 2.6 10*3/uL (ref 0.7–3.1)
Lymphs: 37 %
MCH: 28.5 pg (ref 26.6–33.0)
MCHC: 33.8 g/dL (ref 31.5–35.7)
MCV: 84 fL (ref 79–97)
Monocytes Absolute: 0.4 10*3/uL (ref 0.1–0.9)
Monocytes: 6 %
Neutrophils Absolute: 3.5 10*3/uL (ref 1.4–7.0)
Neutrophils: 52 %
Platelets: 279 10*3/uL (ref 150–379)
RBC: 4.1 x10E6/uL — ABNORMAL LOW (ref 4.14–5.80)
RDW: 13.7 % (ref 12.3–15.4)
WBC: 6.8 10*3/uL (ref 3.4–10.8)

## 2015-11-27 LAB — COMPREHENSIVE METABOLIC PANEL
ALT: 16 IU/L (ref 0–44)
AST: 20 IU/L (ref 0–40)
Albumin/Globulin Ratio: 1.7 (ref 1.1–2.5)
Albumin: 4.7 g/dL (ref 3.5–5.5)
Alkaline Phosphatase: 83 IU/L (ref 39–117)
BUN/Creatinine Ratio: 20 (ref 9–20)
BUN: 45 mg/dL — ABNORMAL HIGH (ref 6–24)
Bilirubin Total: 0.2 mg/dL (ref 0.0–1.2)
CO2: 23 mmol/L (ref 18–29)
Calcium: 9.9 mg/dL (ref 8.7–10.2)
Chloride: 100 mmol/L (ref 96–106)
Creatinine, Ser: 2.29 mg/dL — ABNORMAL HIGH (ref 0.76–1.27)
GFR calc Af Amer: 39 mL/min/{1.73_m2} — ABNORMAL LOW (ref >59)
GFR calc non Af Amer: 34 mL/min/{1.73_m2} — ABNORMAL LOW (ref >59)
Globulin, Total: 2.7 g/dL (ref 1.5–4.5)
Glucose: 90 mg/dL (ref 65–99)
Potassium: 4.5 mmol/L (ref 3.5–5.2)
Sodium: 141 mmol/L (ref 134–144)
Total Protein: 7.4 g/dL (ref 6.0–8.5)

## 2015-11-27 LAB — HGB A1C W/O EAG: Hgb A1c MFr Bld: 5.8 % — ABNORMAL HIGH (ref 4.8–5.6)

## 2015-11-27 LAB — URIC ACID: Uric Acid: 8.9 mg/dL — ABNORMAL HIGH (ref 3.7–8.6)

## 2015-11-27 MED ORDER — OMEGA-3-ACID ETHYL ESTERS 1 G PO CAPS: 1.0000 g | ORAL_CAPSULE | Freq: Two times a day (BID) | ORAL | Status: DC

## 2015-11-27 NOTE — Telephone Encounter (Signed)
Letter sent Will start Lovaza Sending Rx to pharmacy, will start with 1 gm BID and increase to 2 gm BID if needed Recheck lipids in 3 months

## 2015-12-03 LAB — TOXASSURE SELECT 13 (MW), URINE: PDF: 0

## 2015-12-03 LAB — SPECIMEN STATUS REPORT

## 2015-12-05 NOTE — Assessment & Plan Note (Signed)
Patient to see his orthopaedist about this problem soon

## 2015-12-24 ENCOUNTER — Encounter: Payer: Self-pay | Admitting: Family Medicine

## 2015-12-24 ENCOUNTER — Ambulatory Visit (INDEPENDENT_AMBULATORY_CARE_PROVIDER_SITE_OTHER): Payer: Medicare Other | Admitting: Family Medicine

## 2015-12-24 VITALS — BP 123/83 | HR 86 | Temp 97.7°F | Wt 237.0 lb

## 2015-12-24 DIAGNOSIS — D631 Anemia in chronic kidney disease: Secondary | ICD-10-CM

## 2015-12-24 DIAGNOSIS — M25531 Pain in right wrist: Secondary | ICD-10-CM | POA: Diagnosis not present

## 2015-12-24 DIAGNOSIS — N2889 Other specified disorders of kidney and ureter: Secondary | ICD-10-CM | POA: Diagnosis not present

## 2015-12-24 DIAGNOSIS — N189 Chronic kidney disease, unspecified: Secondary | ICD-10-CM

## 2015-12-24 DIAGNOSIS — M1030 Gout due to renal impairment, unspecified site: Secondary | ICD-10-CM | POA: Diagnosis not present

## 2015-12-24 DIAGNOSIS — M65341 Trigger finger, right ring finger: Secondary | ICD-10-CM | POA: Diagnosis not present

## 2015-12-24 DIAGNOSIS — M1009 Idiopathic gout, multiple sites: Secondary | ICD-10-CM

## 2015-12-24 DIAGNOSIS — R635 Abnormal weight gain: Secondary | ICD-10-CM | POA: Insufficient documentation

## 2015-12-24 DIAGNOSIS — M109 Gout, unspecified: Secondary | ICD-10-CM

## 2015-12-24 DIAGNOSIS — Z79899 Other long term (current) drug therapy: Secondary | ICD-10-CM

## 2015-12-24 DIAGNOSIS — I151 Hypertension secondary to other renal disorders: Secondary | ICD-10-CM | POA: Diagnosis not present

## 2015-12-24 DIAGNOSIS — G8929 Other chronic pain: Secondary | ICD-10-CM | POA: Diagnosis not present

## 2015-12-24 DIAGNOSIS — Q615 Medullary cystic kidney: Secondary | ICD-10-CM

## 2015-12-24 MED ORDER — OXYCODONE-ACETAMINOPHEN 10-325 MG PO TABS: 1.0000 | ORAL_TABLET | Freq: Three times a day (TID) | ORAL | Status: DC | PRN

## 2015-12-24 MED ORDER — MORPHINE SULFATE ER 30 MG PO TBCR: 30.0000 mg | EXTENDED_RELEASE_TABLET | Freq: Two times a day (BID) | ORAL | Status: DC

## 2015-12-24 NOTE — Assessment & Plan Note (Signed)
He started taking iron, but i suspect this is more related to renal disease; will check labs today to see if supplemental iron needed

## 2015-12-24 NOTE — Assessment & Plan Note (Signed)
Refer back to nephrologist; already on Uloric; has significant renal dysfunction; avoiding triggers

## 2015-12-24 NOTE — Patient Instructions (Addendum)
I have referred you back to [facility] and Dr. Roberto Scales get some labs today If you have not heard anything from my staff in a week about any orders/referrals/studies from today, please contact us here to follow-up [phone]

## 2015-12-24 NOTE — Assessment & Plan Note (Addendum)
Refer back to Dr. Thedore Mins for evaluation and management; avoid NSAIDs completely

## 2015-12-24 NOTE — Progress Notes (Signed)
BP 123/83 mmHg  Pulse 86  Temp(Src) 97.7 F (36.5 C)  Wt 237 lb (107.502 kg)  SpO2 98%   Subjective:    Patient ID: Nathan Ramos, male    DOB: 1971-03-06, 45 y.o.   MRN: [id number]  HPI: Nathan Ramos is a 45 y.o. male  Chief Complaint  Patient presents with  . Chronic Pain    follow up  . Referral    he states he was supposed to get a referral to Ortho but I don't see a recent one in his chart.   He needs a referral to ortho for the trigger finger in his right hand and pain in the right hand and forearm  He is taking iron for his anemia; started taking 65 mg elemental iron daily, thinking that might help bring his numbers up  His last uric acid was 8.9; has not gotten back in to see the nephrologist; he sees nephrology for gout and chronic renal failure  He has really been trying to diet; no thyroid disease in the family; no recent steroids; drinking enough water, and still can't lose weight  He is having pain in the hand, so we agreed that now is not a good time to reduce pain meds as originally planned at the last visit; no mental status symptoms, not somnolent; I am the only prescriber for pain medicines; not mixing with pain pills, nerve pills, sleeping pills, or alcohol (he does not drink at all)  Relevant past medical, surgical, family and social history reviewed and updated as indicated. Interim medical history since our last visit reviewed. Allergies and medications reviewed and updated.  Review of Systems Per HPI unless specifically indicated above     Objective:    BP 123/83 mmHg  Pulse 86  Temp(Src) 97.7 F (36.5 C)  Wt 237 lb (107.502 kg)  SpO2 98%  Wt Readings from Last 3 Encounters:  12/24/15 237 lb (107.502 kg)  11/26/15 232 lb (105.235 kg)  08/25/15 231 lb (104.781 kg)   body mass index is 36.3 kg/(m^2).  Physical Exam  Constitutional: He appears well-developed and well-nourished. No distress.  Obese; weight up five pounds in last 4 weeks   HENT:  Head: Normocephalic and atraumatic.  Eyes: EOM are normal. No scleral icterus.  Neck: No thyromegaly present.  Cardiovascular: Normal rate and regular rhythm.   Pulmonary/Chest: Effort normal and breath sounds normal.  Abdominal: Soft. Bowel sounds are normal. He exhibits no distension.  Musculoskeletal: He exhibits no edema.       Right wrist: He exhibits deformity (cystic swelling over the dorsal aspect of right wrist and hand).       Right hand: He exhibits decreased range of motion, tenderness and deformity (trigger finger 4th (ring) finger of right hand). He exhibits no swelling.  Neurological: Coordination normal.  Skin: Skin is warm and dry. No pallor.  Psychiatric: He has a normal mood and affect. His behavior is normal. Judgment and thought content normal. His speech is not slurred. Cognition and memory are not impaired.  Good eye contact with examiner      Assessment & Plan:   Problem List Items Addressed This Visit      Cardiovascular and Mediastinum   Hypertension    Controlled today        Musculoskeletal and Integument   Gouty arthritis    Will get him back in to see his nephrologist; needs to get the uric acid level down to prevent involvement of other  joints, worsening of currently involved joints      Relevant Medications   morphine (MS CONTIN) 30 MG 12 hr tablet   oxyCODONE-acetaminophen (PERCOCET) 10-325 MG tablet   Trigger ring finger of right hand - Primary    Refer to orthopaedist; I apologized for not making referral at last visit; we'll get him back in to see specialist at [facility]      Relevant Orders   Ambulatory referral to Orthopedic Surgery     Genitourinary   Medullary cystic disease of the kidney    Refer back to Dr. Thedore Mins for evaluation and management; avoid NSAIDs completely      Relevant Orders   Ambulatory referral to Nephrology     Other   Chronic pain   Relevant Medications   morphine (MS CONTIN) 30 MG 12 hr tablet    oxyCODONE-acetaminophen (PERCOCET) 10-325 MG tablet   Gout due to renal impairment    Refer back to nephrologist; already on Uloric; has significant renal dysfunction; avoiding triggers      Relevant Orders   Ambulatory referral to Nephrology   Anemia    He started taking iron, but i suspect this is more related to renal disease; will check labs today to see if supplemental iron needed      Relevant Medications   ferrous sulfate 325 (65 FE) MG tablet   Other Relevant Orders   Ambulatory referral to Nephrology   CBC with Differential/Platelet (Completed)   Iron and TIBC (Completed)   Ferritin (Completed)   Controlled substance agreement signed    Will continue pain meds at current doses right now; no concern for misuse or diversion      Wrist pain, right    Back to orthopaedist at [facility]      Abnormal weight gain    Gain of five pounds in four weeks, despite efforts to try to lose; check TSH to make sure not hypothyroid; he does not appear fluid-overloaded      Relevant Orders   TSH (Completed)      Follow up plan: Return in about 1 month (around 01/21/2016) for follow-up.  Orders Placed This Encounter  Procedures  . CBC with Differential/Platelet  . Iron and TIBC  . Ferritin  . TSH  . Ambulatory referral to Orthopedic Surgery  . Ambulatory referral to Nephrology   An after-visit summary was printed and given to the patient at check-out.  Please see the patient instructions which may contain other information and recommendations beyond what is mentioned above in the assessment and plan.

## 2015-12-25 LAB — CBC WITH DIFFERENTIAL/PLATELET
Basophils Absolute: 0.1 10*3/uL (ref 0.0–0.2)
Basos: 1 %
EOS (ABSOLUTE): 0.4 10*3/uL (ref 0.0–0.4)
Eos: 5 %
Hematocrit: 36.3 % — ABNORMAL LOW (ref 37.5–51.0)
Hemoglobin: 12.1 g/dL — ABNORMAL LOW (ref 12.6–17.7)
Immature Grans (Abs): 0 10*3/uL (ref 0.0–0.1)
Immature Granulocytes: 0 %
Lymphocytes Absolute: 2.9 10*3/uL (ref 0.7–3.1)
Lymphs: 34 %
MCH: 28.1 pg (ref 26.6–33.0)
MCHC: 33.3 g/dL (ref 31.5–35.7)
MCV: 84 fL (ref 79–97)
Monocytes Absolute: 0.8 10*3/uL (ref 0.1–0.9)
Monocytes: 9 %
Neutrophils Absolute: 4.4 10*3/uL (ref 1.4–7.0)
Neutrophils: 51 %
Platelets: 264 10*3/uL (ref 150–379)
RBC: 4.3 x10E6/uL (ref 4.14–5.80)
RDW: 13.7 % (ref 12.3–15.4)
WBC: 8.5 10*3/uL (ref 3.4–10.8)

## 2015-12-25 LAB — IRON AND TIBC
Iron Saturation: 27 % (ref 15–55)
Iron: 77 ug/dL (ref 38–169)
Total Iron Binding Capacity: 286 ug/dL (ref 250–450)
UIBC: 209 ug/dL (ref 111–343)

## 2015-12-25 LAB — FERRITIN: Ferritin: 128 ng/mL (ref 30–400)

## 2015-12-25 LAB — TSH: TSH: 2.24 u[IU]/mL (ref 0.450–4.500)

## 2015-12-26 NOTE — Assessment & Plan Note (Signed)
Will get him back in to see his nephrologist; needs to get the uric acid level down to prevent involvement of other joints, worsening of currently involved joints

## 2015-12-26 NOTE — Assessment & Plan Note (Signed)
Gain of five pounds in four weeks, despite efforts to try to lose; check TSH to make sure not hypothyroid; he does not appear fluid-overloaded

## 2015-12-26 NOTE — Assessment & Plan Note (Signed)
Back to orthopaedist at [facility]

## 2015-12-26 NOTE — Assessment & Plan Note (Signed)
Refer to orthopaedist; I apologized for not making referral at last visit; we'll get him back in to see specialist at [facility]

## 2015-12-26 NOTE — Assessment & Plan Note (Signed)
Will continue pain meds at current doses right now; no concern for misuse or diversion

## 2015-12-26 NOTE — Assessment & Plan Note (Signed)
Controlled today 

## 2016-01-07 ENCOUNTER — Other Ambulatory Visit: Payer: Self-pay | Admitting: Family Medicine

## 2016-01-07 NOTE — Telephone Encounter (Signed)
Your patient 

## 2016-01-07 NOTE — Telephone Encounter (Signed)
Pertinent labs from Jan 2017 reviewed; Rxs approved

## 2016-01-10 DIAGNOSIS — M25531 Pain in right wrist: Secondary | ICD-10-CM | POA: Diagnosis not present

## 2016-01-18 ENCOUNTER — Telehealth: Payer: Self-pay | Admitting: Family Medicine

## 2016-01-18 DIAGNOSIS — G8929 Other chronic pain: Secondary | ICD-10-CM

## 2016-01-18 NOTE — Telephone Encounter (Signed)
Pt called wants to know if he needs to be seen or if he can just come get his RX. Stated he is on the 28 day plan. Pt stated he did not know when he is due, wants to make sure he gets the RX before Dr. Sherie DonLada leaves. Thanks.

## 2016-01-19 MED ORDER — MORPHINE SULFATE ER 30 MG PO TBCR: 30.0000 mg | EXTENDED_RELEASE_TABLET | Freq: Two times a day (BID) | ORAL | Status: DC

## 2016-01-19 MED ORDER — OXYCODONE-ACETAMINOPHEN 10-325 MG PO TABS: 1.0000 | ORAL_TABLET | Freq: Three times a day (TID) | ORAL | Status: DC | PRN

## 2016-01-19 NOTE — Telephone Encounter (Signed)
I will not be here on Friday, so we'll move his appointment back to the next month; he will need to be seen for his next refill in person; thank you; Rxs ready for pick-up now, he can fill on or after Saturday

## 2016-01-19 NOTE — Telephone Encounter (Signed)
Routing to provider  

## 2016-01-21 DIAGNOSIS — I10 Essential (primary) hypertension: Secondary | ICD-10-CM | POA: Diagnosis not present

## 2016-01-21 DIAGNOSIS — Q615 Medullary cystic kidney: Secondary | ICD-10-CM | POA: Diagnosis not present

## 2016-01-21 DIAGNOSIS — M1A00X0 Idiopathic chronic gout, unspecified site, without tophus (tophi): Secondary | ICD-10-CM | POA: Diagnosis not present

## 2016-02-22 ENCOUNTER — Ambulatory Visit (INDEPENDENT_AMBULATORY_CARE_PROVIDER_SITE_OTHER): Payer: Medicare Other | Admitting: Family Medicine

## 2016-02-22 ENCOUNTER — Encounter: Payer: Self-pay | Admitting: Family Medicine

## 2016-02-22 VITALS — BP 138/68 | HR 94 | Temp 98.4°F | Resp 14 | Wt 242.0 lb

## 2016-02-22 DIAGNOSIS — Z79899 Other long term (current) drug therapy: Secondary | ICD-10-CM

## 2016-02-22 DIAGNOSIS — M1030 Gout due to renal impairment, unspecified site: Secondary | ICD-10-CM | POA: Diagnosis not present

## 2016-02-22 DIAGNOSIS — E669 Obesity, unspecified: Secondary | ICD-10-CM

## 2016-02-22 DIAGNOSIS — G8929 Other chronic pain: Secondary | ICD-10-CM

## 2016-02-22 MED ORDER — OXYCODONE-ACETAMINOPHEN 10-325 MG PO TABS: 1.0000 | ORAL_TABLET | Freq: Three times a day (TID) | ORAL | Status: DC | PRN

## 2016-02-22 MED ORDER — MORPHINE SULFATE ER 30 MG PO TBCR: 30.0000 mg | EXTENDED_RELEASE_TABLET | Freq: Two times a day (BID) | ORAL | Status: DC

## 2016-02-22 NOTE — Progress Notes (Signed)
BP 138/68 mmHg  Pulse 94  Temp(Src) 98.4 F (36.9 C) (Oral)  Resp 14  Wt 242 lb (109.77 kg)  SpO2 97%   Subjective:    Patient ID: Nathan Ramos, male    DOB: 05/30/1971, 45 y.o.   MRN: [id number]  HPI: Nathan Ramos is a 45 y.o. male  Chief Complaint  Patient presents with  . Medication Refill  . Hypertension  . Hyperlipidemia  . Pain    joint pain and gout  . 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, industrial maintenance  He has chronic pain; here for refills of pain medicine which he has been taking for greater than two years; we weaned his short-acting medicine about 4-6 months ago and he has done well since then; he feels that medicine is helpful for maintaining his daily activities; without his medicine, he says he couldn't work; no adverse side effects from the medicine such as constipation or feeling loopy; no red flags for patient; he does not feel like his brain is telling him to take more to take to get high; pain level with medicine comes down to a 3 out of 10; able to function better; using breakthrough medicine twice a day regularly; not drinking any alcohol with this medicine  Relevant past medical and social history reviewed Interim medical history since our last visit reviewed. Allergies and medications reviewed and updated.  Review of Systems Per HPI unless specifically indicated above     Objective:    BP 138/68 mmHg  Pulse 94  Temp(Src) 98.4 F (36.9 C) (Oral)  Resp 14  Wt 242 lb (109.77 kg)  SpO2 97%  Wt Readings from Last 3 Encounters:  02/22/16 242 lb  (109.77 kg)  12/24/15 237 lb (107.502 kg)  11/26/15 232 lb (105.235 kg)   body mass index is 37.06 kg/(m^2).  Physical Exam  Constitutional: He appears well-developed and well-nourished. No distress.  Obese; weight up five pounds over last 2 months  Eyes: EOM are normal. No scleral icterus.  Cardiovascular: Normal rate and regular rhythm.   Pulmonary/Chest: Effort normal and breath sounds normal.  Abdominal: Soft. Bowel sounds are normal. He exhibits no distension.  Musculoskeletal: He exhibits no edema.       Right hand: He exhibits decreased range of motion, tenderness and deformity (trigger finger 4th (ring) finger of right hand).  Neurological: Coordination normal.  Skin: Skin is warm and dry. No pallor.  Psychiatric: He has a normal mood and affect. His behavior is normal. Judgment and thought content normal. His speech is not slurred. Cognition and memory are not impaired.  Good eye contact with examiner      Assessment & Plan:   Problem List Items Addressed This Visit      Other   Chronic pain - Primary    Patient has been on chronic pain medicines for years now; no concern at this time for misuse or diversion; new controlled substance contract signed; we did manage to wean his medicine somewhat within the last six months or so; he has ongoing pain  involving the right arm/wrist/hand and we anticipate surgery for that in the next few months; no further weaning at this time until after surgery, and then will attempt to wean again      Relevant Medications   morphine (MS CONTIN) 30 MG 12 hr tablet   oxyCODONE-acetaminophen (PERCOCET) 10-325 MG tablet   Gout due to renal impairment    Managed by nephrologist; higher dose now of Uloric; try to limit foods rich in purines      Controlled substance agreement signed   Obesity    encoruaged weight loss; see AVS         Follow up plan: Return in about 3 months (around 05/23/2016) for visit and fasting labs.  An after-visit  summary was printed and given to the patient at check-out.  Please see the patient instructions which may contain other information and recommendations beyond what is mentioned above in the assessment and plan. Meds ordered this encounter  Medications  . Febuxostat (ULORIC) 80 MG TABS    Sig: Take 1 tablet by mouth daily.  Marland Kitchen morphine (MS CONTIN) 30 MG 12 hr tablet    Sig: Take 1 tablet (30 mg total) by mouth every 12 (twelve) hours.    Dispense:  60 tablet    Refill:  0    Controlled substance contract; 30 day supply prescribed  . oxyCODONE-acetaminophen (PERCOCET) 10-325 MG tablet    Sig: Take 1 tablet by mouth every 8 (eight) hours as needed for pain (max two pills per 24 hours).    Dispense:  60 tablet    Refill:  0    Controlled substance contract; 30 day supply prescribed

## 2016-02-22 NOTE — Patient Instructions (Signed)
Call in 4 weeks for your next prescription Return in 3 months for your next visit with fasting labs Check out the information at familydoctor.org entitled "What It Takes to Lose Weight" Try to lose between 1-2 pounds per week by taking in fewer calories and burning off more calories You can succeed by limiting portions, limiting foods dense in calories and fat, becoming more active, and drinking 8 glasses of water a day (64 ounces) Don't skip meals, especially breakfast, as skipping meals may alter your metabolism Do not use over-the-counter weight loss pills or gimmicks that claim rapid weight loss A healthy BMI (or body mass index) is between 18.5 and 24.9 You can calculate your ideal BMI at the NIH website [employer]huhttp://www.nhlbi.nih.gov/health/educational/lose_wt/BMI/bmicalc.htm

## 2016-03-05 DIAGNOSIS — E669 Obesity, unspecified: Secondary | ICD-10-CM | POA: Insufficient documentation

## 2016-03-05 NOTE — Assessment & Plan Note (Signed)
Managed by nephrologist; higher dose now of Uloric; try to limit foods rich in purines

## 2016-03-05 NOTE — Assessment & Plan Note (Signed)
encoruaged weight loss; see AVS

## 2016-03-05 NOTE — Assessment & Plan Note (Signed)
Patient has been on chronic pain medicines for years now; no concern at this time for misuse or diversion; new controlled substance contract signed; we did manage to wean his medicine somewhat within the last six months or so; he has ongoing pain involving the right arm/wrist/hand and we anticipate surgery for that in the next few months; no further weaning at this time until after surgery, and then will attempt to wean again

## 2016-03-06 ENCOUNTER — Telehealth: Payer: Self-pay | Admitting: Family Medicine

## 2016-03-06 NOTE — Telephone Encounter (Signed)
Patient called wanted to know if poopy seed muffins would effect his drug screen?

## 2016-03-06 NOTE — Telephone Encounter (Signed)
Pt would like a call back. He has a question about what we use to test for controlled meds.

## 2016-03-06 NOTE — Telephone Encounter (Signed)
Note was closed when I got it, so it will probably be closed when you get it... Sorry I would ask him to direct that question to [facility], but I do believe it can affect the tests Opium is made from poppy seeds, but I don't know how much one would have to ingest or how much one muffin would affect the assay Thank you

## 2016-03-24 ENCOUNTER — Other Ambulatory Visit: Payer: Self-pay | Admitting: Family Medicine

## 2016-03-24 DIAGNOSIS — G8929 Other chronic pain: Secondary | ICD-10-CM

## 2016-03-24 NOTE — Telephone Encounter (Signed)
Requesting refill on Oxycodone and Morphine. Have enough to last through Monday.

## 2016-03-25 MED ORDER — MORPHINE SULFATE ER 30 MG PO TBCR: 30.0000 mg | EXTENDED_RELEASE_TABLET | Freq: Two times a day (BID) | ORAL | Status: DC

## 2016-03-25 MED ORDER — OXYCODONE-ACETAMINOPHEN 10-325 MG PO TABS: 1.0000 | ORAL_TABLET | Freq: Three times a day (TID) | ORAL | Status: DC | PRN

## 2016-03-25 NOTE — Telephone Encounter (Signed)
Rxs printed (working from home); will sign them at the office on Monday; thank you for letting me know he would not run out over the weekend

## 2016-04-24 ENCOUNTER — Telehealth: Payer: Self-pay | Admitting: Family Medicine

## 2016-04-24 DIAGNOSIS — G8929 Other chronic pain: Secondary | ICD-10-CM

## 2016-04-24 NOTE — Telephone Encounter (Signed)
Pt states he needs his Oxycodone refilled and Morphine Solphate.

## 2016-04-25 MED ORDER — OXYCODONE-ACETAMINOPHEN 10-325 MG PO TABS: 1.0000 | ORAL_TABLET | Freq: Three times a day (TID) | ORAL | Status: DC | PRN

## 2016-04-25 MED ORDER — MORPHINE SULFATE ER 30 MG PO TBCR: 30.0000 mg | EXTENDED_RELEASE_TABLET | Freq: Two times a day (BID) | ORAL | Status: DC

## 2016-04-25 NOTE — Telephone Encounter (Signed)
[facility] webiste reviewed He had each of those filled on 03/27/16, so I hope he will have enough until tomorrow (04/27/16) Please let him know I should be back in the office tomorrow (hopefully) and he can pick up Rx first thing in the morning I'm printing them out now and can sign them when I return; thank you

## 2016-04-25 NOTE — Telephone Encounter (Signed)
LMOM to inform pt °

## 2016-05-18 ENCOUNTER — Other Ambulatory Visit: Payer: Self-pay | Admitting: Family Medicine

## 2016-05-23 ENCOUNTER — Other Ambulatory Visit: Payer: Self-pay | Admitting: Family Medicine

## 2016-05-23 DIAGNOSIS — G8929 Other chronic pain: Secondary | ICD-10-CM

## 2016-05-23 NOTE — Telephone Encounter (Signed)
PT IS OUT OF HIS PAIN MEDICATIONS. PT COULD NOT GET AN APPT TILL THE 24TH BUT WANTS TO KNOW IF YOU COULD SPOT HIM ENOUGH TILL THAT TIME FOR HE KNOWS THAT HE WILL HAVE TO HAVE DRUG SCREEN.

## 2016-05-24 MED ORDER — MORPHINE SULFATE ER 30 MG PO TBCR: 30.0000 mg | EXTENDED_RELEASE_TABLET | Freq: Two times a day (BID) | ORAL | Status: DC

## 2016-05-24 MED ORDER — OXYCODONE-ACETAMINOPHEN 10-325 MG PO TABS: 1.0000 | ORAL_TABLET | Freq: Three times a day (TID) | ORAL | Status: DC | PRN

## 2016-05-24 NOTE — Telephone Encounter (Signed)
Patient should not be out of his medicines; [facility] website shows that his two pain medicines were filled on June 15th; 30 days in June means he should have had enough until July 15th I called patient and he says he is not out of his medicines; his wife called and left that message apparently with staff He will be due in a few days; I'll print out Rx and he can pick that up today or later this week He thinks he has a bladder infection; important to get that checked out, so I urged him to go to urgent care

## 2016-06-05 ENCOUNTER — Ambulatory Visit (INDEPENDENT_AMBULATORY_CARE_PROVIDER_SITE_OTHER): Payer: Medicare Other | Admitting: Family Medicine

## 2016-06-05 ENCOUNTER — Encounter: Payer: Self-pay | Admitting: Family Medicine

## 2016-06-05 DIAGNOSIS — D631 Anemia in chronic kidney disease: Secondary | ICD-10-CM

## 2016-06-05 DIAGNOSIS — N189 Chronic kidney disease, unspecified: Secondary | ICD-10-CM

## 2016-06-05 DIAGNOSIS — M1009 Idiopathic gout, multiple sites: Secondary | ICD-10-CM | POA: Diagnosis not present

## 2016-06-05 DIAGNOSIS — E785 Hyperlipidemia, unspecified: Secondary | ICD-10-CM | POA: Diagnosis not present

## 2016-06-05 DIAGNOSIS — N183 Chronic kidney disease, stage 3 (moderate): Secondary | ICD-10-CM | POA: Diagnosis not present

## 2016-06-05 DIAGNOSIS — R7301 Impaired fasting glucose: Secondary | ICD-10-CM | POA: Diagnosis not present

## 2016-06-05 DIAGNOSIS — M109 Gout, unspecified: Secondary | ICD-10-CM

## 2016-06-05 DIAGNOSIS — Z5181 Encounter for therapeutic drug level monitoring: Secondary | ICD-10-CM | POA: Diagnosis not present

## 2016-06-05 DIAGNOSIS — N2889 Other specified disorders of kidney and ureter: Secondary | ICD-10-CM | POA: Diagnosis not present

## 2016-06-05 DIAGNOSIS — G8929 Other chronic pain: Secondary | ICD-10-CM | POA: Diagnosis not present

## 2016-06-05 DIAGNOSIS — Z720 Tobacco use: Secondary | ICD-10-CM | POA: Diagnosis not present

## 2016-06-05 DIAGNOSIS — I151 Hypertension secondary to other renal disorders: Secondary | ICD-10-CM

## 2016-06-05 DIAGNOSIS — Z79899 Other long term (current) drug therapy: Secondary | ICD-10-CM

## 2016-06-05 LAB — CBC WITH DIFFERENTIAL/PLATELET
Basophils Absolute: 74 cells/uL (ref 0–200)
Basophils Relative: 1 %
Eosinophils Absolute: 370 cells/uL (ref 15–500)
Eosinophils Relative: 5 %
HCT: 33.6 % — ABNORMAL LOW (ref 38.5–50.0)
Hemoglobin: 11.3 g/dL — ABNORMAL LOW (ref 13.2–17.1)
Lymphocytes Relative: 37 %
Lymphs Abs: 2738 cells/uL (ref 850–3900)
MCH: 28.6 pg (ref 27.0–33.0)
MCHC: 33.6 g/dL (ref 32.0–36.0)
MCV: 85.1 fL (ref 80.0–100.0)
MPV: 10.7 fL (ref 7.5–12.5)
Monocytes Absolute: 666 cells/uL (ref 200–950)
Monocytes Relative: 9 %
Neutro Abs: 3552 cells/uL (ref 1500–7800)
Neutrophils Relative %: 48 %
Platelets: 241 10*3/uL (ref 140–400)
RBC: 3.95 MIL/uL — ABNORMAL LOW (ref 4.20–5.80)
RDW: 14.2 % (ref 11.0–15.0)
WBC: 7.4 10*3/uL (ref 3.8–10.8)

## 2016-06-05 LAB — LIPID PANEL
Cholesterol: 196 mg/dL (ref 125–200)
HDL: 21 mg/dL — ABNORMAL LOW (ref >=40)
Total CHOL/HDL Ratio: 9.3 Ratio — ABNORMAL HIGH (ref <=5.0)
Triglycerides: 840 mg/dL — ABNORMAL HIGH (ref <150)

## 2016-06-05 LAB — COMPREHENSIVE METABOLIC PANEL
ALT: 16 U/L (ref 9–46)
AST: 16 U/L (ref 10–40)
Albumin: 4.3 g/dL (ref 3.6–5.1)
Alkaline Phosphatase: 76 U/L (ref 40–115)
BUN: 54 mg/dL — ABNORMAL HIGH (ref 7–25)
CO2: 21 mmol/L (ref 20–31)
Calcium: 9.1 mg/dL (ref 8.6–10.3)
Chloride: 103 mmol/L (ref 98–110)
Creat: 2.77 mg/dL — ABNORMAL HIGH (ref 0.60–1.35)
Glucose, Bld: 87 mg/dL (ref 65–99)
Potassium: 4.2 mmol/L (ref 3.5–5.3)
Sodium: 136 mmol/L (ref 135–146)
Total Bilirubin: 0.2 mg/dL (ref 0.2–1.2)
Total Protein: 7.1 g/dL (ref 6.1–8.1)

## 2016-06-05 LAB — URIC ACID: Uric Acid, Serum: 6.9 mg/dL (ref 4.0–8.0)

## 2016-06-05 MED ORDER — FUROSEMIDE 40 MG PO TABS: 40.0000 mg | ORAL_TABLET | ORAL | 1 refills | Status: DC

## 2016-06-05 NOTE — Assessment & Plan Note (Signed)
Check A1c. 

## 2016-06-05 NOTE — Assessment & Plan Note (Addendum)
Check CBC, anemia of chronic disease

## 2016-06-05 NOTE — Assessment & Plan Note (Signed)
UDS done today; safety discussed, safeguard medicines

## 2016-06-05 NOTE — Assessment & Plan Note (Addendum)
Checked with nephrologist; spoke with Dr. Thedore Mins; he agrees with decreasing frequency of furosemide to every other day or 2-3 days per week; avoiding NSAIDs

## 2016-06-05 NOTE — Assessment & Plan Note (Signed)
Encouraged patient in his efforts to cut down; quitting would be the greatest thing he could do for his health

## 2016-06-05 NOTE — Progress Notes (Signed)
BP 130/72   Pulse 93   Temp 97.9 F (36.6 C) (Oral)   Resp 16   Wt 242 lb (109.8 kg)   SpO2 98%   BMI 37.07 kg/m    Subjective:    Patient ID: Nathan Ramos, male    DOB: 1970/12/12, 45 y.o.   MRN: [id number]  HPI: Nathan Ramos is a 45 y.o. male  Chief Complaint  Patient presents with  . Follow-up  . Medication Refill    Patient is well-known to me from previous practice Since last visit, no medical excitement; no other visits; may be having shoulder and hand surgery at the end of the year  Chronic pain; on two oral pain medicines; long-acting and the breakthrough pain; no problems with constipation; not feeling loopy or goofy or overmedicated; keeps medicines locked up unless with him; no alcohol; he believes the medicine is helping him function; he can get up some mornings and does not want to get up out of bed; aches so bad until he takes his medicine, "it's unreal"; helping him function; more benefit than harm; cannot lay on the right shoulder, feels like it is drawing up; bone spur pushing against rotator cuff, ortho is [facility] Reviewed NCCSRS website; both pain med prescriptions filled on July 16th; no early fills; no other prescribers  Hypertension; BP well-controlled; he has stood up a few times this week and felt a little woozy; he is trying to stay hydrated; no air conditioning in the truck; sweats when driving around  High cholesterol; trying to watch diet  Prediabetes; A1c in January was 5.8; does not avoid sugary drinks, but might try; not much bread  Gout; last flare was several months ago; moderation, no alcohol  Obesity; his weight is holding steady; eating healthy; weighed 285 pounds at one time, dropped to 220 pounds and then bounced back up to 245 pounds; 215 pounds in high school; tries to control his portions Lab Results  Component Value Date   TSH 2.240 12/24/2015   Smoking; lights and smoking 1/2 ppd; trying to cut back  Depression screen  [facility] 2/9 06/05/2016 02/22/2016  Decreased Interest 0 0  Down, Depressed, Hopeless 0 0  PHQ - 2 Score 0 0   Relevant past medical, surgical, family and social history reviewed Past Medical History:  Diagnosis Date  . Anemia    of chronic renal disease  . Bone spur of other site    right shoulder, managed by ortho  . Chronic pain    [name] CFNP at [facility]  . CKD (chronic kidney disease)    stage III 03/2014 (Dr. Mosetta Pigeon)  . Controlled substance agreement signed    signed 06/02/15  . Gout   . Gouty arthritis    knee, managed by Ortho  . Hyperlipidemia   . Hypertension   . IFG (impaired fasting glucose)   . Left knee DJD   . 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 Primary localized osteoarthritis of right knee   . Smoker    Past Surgical History:  Procedure Laterality Date  . CARPAL TUNNEL RELEASE Left   . CATARACT EXTRACTION W/ INTRAOCULAR LENS IMPLANT Left 2008  . CATARACT EXTRACTION W/ INTRAOCULAR LENS IMPLANT Right 2008  . EYE SURGERY    . HERNIA REPAIR    . JOINT REPLACEMENT Left 03/2014  . TOTAL KNEE ARTHROPLASTY Left 03/30/2014   Procedure: TOTAL KNEE ARTHROPLASTY;  Surgeon: Molly Maduro  Salley Slaughter, MD;  Location: [facility] OR;  Service: Orthopedics;  Laterality: Left;  . TOTAL KNEE ARTHROPLASTY Right 10/26/2014   Procedure: TOTAL KNEE ARTHROPLASTY;  Surgeon: Nilda Simmer, MD;  Location: [facility] OR;  Service: Orthopedics;  Laterality: Right;  . UMBILICAL HERNIA REPAIR  2010   Family History  Problem Relation Age of Onset  . COPD Mother   . Kidney disease Mother   . Thyroid disease Mother   . Stroke Father 75   Social History  Substance Use Topics  . Smoking status: Current Every Day Smoker    Packs/day: 0.50    Years: 22.00    Types: Cigarettes  . Smokeless tobacco: Never Used  . Alcohol use No    Interim medical history since last visit reviewed. Allergies and medications reviewed  Review of Systems    Cardiovascular: Negative for leg swelling.  Endocrine: Positive for polydipsia. Negative for polyuria.   Per HPI unless specifically indicated above     Objective:    BP 130/72   Pulse 93   Temp 97.9 F (36.6 C) (Oral)   Resp 16   Wt 242 lb (109.8 kg)   SpO2 98%   BMI 37.07 kg/m   Wt Readings from Last 3 Encounters:  06/05/16 242 lb (109.8 kg)  02/22/16 242 lb (109.8 kg)  12/24/15 237 lb (107.5 kg)    Physical Exam  Constitutional: He appears well-developed and well-nourished. No distress.  Obese; weight up five pounds over last 2 months  Eyes: EOM are normal. No scleral icterus.  Cardiovascular: Normal rate and regular rhythm.   Pulmonary/Chest: Effort normal and breath sounds normal.  Abdominal: Soft. Bowel sounds are normal. He exhibits no distension.  Musculoskeletal: He exhibits no edema.       Right hand: He exhibits decreased range of motion, tenderness and deformity (trigger finger 4th (ring) finger of right hand).  Neurological: He is alert.  Skin: Skin is warm and dry. No pallor.  Psychiatric: He has a normal mood and affect. His behavior is normal. Judgment and thought content normal. His speech is not slurred. Cognition and memory are not impaired.  Good eye contact with examiner   Results for orders placed or performed in visit on 12/24/15  CBC with Differential/Platelet  Result Value Ref Range   WBC 8.5 3.4 - 10.8 x10E3/uL   RBC 4.30 4.14 - 5.80 x10E6/uL   Hemoglobin 12.1 (L) 12.6 - 17.7 g/dL   Hematocrit 16.1 (L) 09.6 - 51.0 %   MCV 84 79 - 97 fL   MCH 28.1 26.6 - 33.0 pg   MCHC 33.3 31.5 - 35.7 g/dL   RDW 04.5 40.9 - 81.1 %   Platelets 264 150 - 379 x10E3/uL   Neutrophils 51 %   Lymphs 34 %   Monocytes 9 %   Eos 5 %   Basos 1 %   Neutrophils Absolute 4.4 1.4 - 7.0 x10E3/uL   Lymphocytes Absolute 2.9 0.7 - 3.1 x10E3/uL   Monocytes Absolute 0.8 0.1 - 0.9 x10E3/uL   EOS (ABSOLUTE) 0.4 0.0 - 0.4 x10E3/uL   Basophils Absolute 0.1 0.0 - 0.2  x10E3/uL   Immature Granulocytes 0 %   Immature Grans (Abs) 0.0 0.0 - 0.1 x10E3/uL  Iron and TIBC  Result Value Ref Range   Total Iron Binding Capacity 286 250 - 450 ug/dL   UIBC 914 782 - 956 ug/dL   Iron 77 38 - 213 ug/dL   Iron Saturation 27 15 - 55 %  Ferritin  Result Value Ref Range   Ferritin 128 30 - 400 ng/mL  TSH  Result Value Ref Range   TSH 2.240 0.450 - 4.500 uIU/mL      Assessment & Plan:   Problem List Items Addressed This Visit      Cardiovascular and Mediastinum   Hypertension    Well-controlled today      Relevant Medications   furosemide (LASIX) 40 MG tablet     Endocrine   IFG (impaired fasting glucose)    Check A1c      Relevant Orders   Hemoglobin A1c     Musculoskeletal and Integument   Gouty arthritis    Check uric acid      Relevant Orders   Uric acid     Genitourinary   CKD (chronic kidney disease)    Checked with nephrologist; spoke with Dr. Thedore Mins; he agrees with decreasing frequency of furosemide to every other day or 2-3 days per week; avoiding NSAIDs        Other   Tobacco abuse    Encouraged patient in his efforts to cut down; quitting would be the greatest thing he could do for his health      Medication monitoring encounter    Monitor med      Relevant Orders   Comprehensive metabolic panel   Hyperlipidemia    Check lipids      Relevant Medications   furosemide (LASIX) 40 MG tablet   Other Relevant Orders   Lipid panel   Controlled substance agreement signed    UDS done today; safety discussed, safeguard medicines      Chronic pain    Continue current medicines as before; pain medicine helping him function, improve quality of life; he has been on these for more than 24 months; NCCSRS website reviewed; no early fills, no other prescribers; controlled substance contract on chart; UDS collected today; no alcohol      Anemia    Check CBC, anemia of chronic disease      Relevant Orders   CBC with  Differential/Platelet    Other Visit Diagnoses   None.     Follow up plan: Return in about 3 months (around 09/05/2016) for medication management.  An after-visit summary was printed and given to the patient at check-out.  Please see the patient instructions which may contain other information and recommendations beyond what is mentioned above in the assessment and plan.  Meds ordered this encounter  Medications  . furosemide (LASIX) 40 MG tablet    Sig: Take 1 tablet (40 mg total) by mouth every other day.    Dispense:  45 tablet    Refill:  1    New instructions; alternating daily now    Orders Placed This Encounter  Procedures  . Uric acid  . Lipid panel  . CBC with Differential/Platelet  . Hemoglobin A1c  . Comprehensive metabolic panel

## 2016-06-05 NOTE — Assessment & Plan Note (Signed)
Well controlled today.

## 2016-06-05 NOTE — Assessment & Plan Note (Signed)
Check uric acid. 

## 2016-06-05 NOTE — Patient Instructions (Signed)
Check out the information at familydoctor.org entitled "Nutrition for Weight Loss: What You Need to Know about Fad Diets" Try to lose between 1-2 pounds per week by taking in fewer calories and burning off more calories You can succeed by limiting portions, limiting foods dense in calories and fat, becoming more active, and drinking 8 glasses of water a day (64 ounces) Don't skip meals, especially breakfast, as skipping meals may alter your metabolism Do not use over-the-counter weight loss pills or gimmicks that claim rapid weight loss A healthy BMI (or body mass index) is between 18.5 and 24.9 You can calculate your ideal BMI at the NIH website [employer]  Try to limit saturated fats in your diet (bologna, hot dogs, barbeque, cheeseburgers, hamburgers, steak, bacon, sausage, cheese, etc.) and get more fresh fruits, vegetables, and whole grains  Decrease the furosemide to every OTHER day

## 2016-06-05 NOTE — Assessment & Plan Note (Signed)
Monitor med

## 2016-06-05 NOTE — Assessment & Plan Note (Signed)
Continue current medicines as before; pain medicine helping him function, improve quality of life; he has been on these for more than 24 months; NCCSRS website reviewed; no early fills, no other prescribers; controlled substance contract on chart; UDS collected today; no alcohol

## 2016-06-05 NOTE — Assessment & Plan Note (Signed)
Check lipids 

## 2016-06-06 ENCOUNTER — Other Ambulatory Visit: Payer: Self-pay | Admitting: Family Medicine

## 2016-06-06 DIAGNOSIS — E781 Pure hyperglyceridemia: Secondary | ICD-10-CM

## 2016-06-06 LAB — HEMOGLOBIN A1C
Hgb A1c MFr Bld: 5.9 % — ABNORMAL HIGH (ref <5.7)
Mean Plasma Glucose: 123 mg/dL

## 2016-06-06 MED ORDER — ICOSAPENT ETHYL 1 G PO CAPS: 2.0000 | ORAL_CAPSULE | Freq: Two times a day (BID) | ORAL | 5 refills | Status: DC

## 2016-06-06 NOTE — Assessment & Plan Note (Signed)
Recheck lipids in 8 weeks after starting vascepa

## 2016-06-14 ENCOUNTER — Encounter: Payer: Self-pay | Admitting: Family Medicine

## 2016-06-27 ENCOUNTER — Telehealth: Payer: Self-pay | Admitting: Family Medicine

## 2016-06-27 ENCOUNTER — Other Ambulatory Visit: Payer: Self-pay

## 2016-06-27 DIAGNOSIS — G8929 Other chronic pain: Secondary | ICD-10-CM

## 2016-06-27 NOTE — Telephone Encounter (Signed)
I am unfortunately away on vacation and received this request for a schedule II controlled substance This patient [facility] and I took over management of his pain medicine from [facility] Taylor, one of our practitioners there, when she left a few years ago I reviewed the [employer] RxCCSRS website and see that he last filled his long-acting and short-acting pain medicine both on May 28, 2016, so these are both due No early fills No other prescribers No red flags He has been compliant with visits, has signed controlled substance contract on the chart, and a consistent recent UDS Since I am away, I will ask if Dr. Carlynn PurlSowles or Dr. Sherryll BurgerShah would mind writing a six day supply to last until I return on Monday morning; I apologize for asking one of them to prescribe a schedule II for a patient they don't know, but would greatly appreciate it in my absence

## 2016-06-28 MED ORDER — OXYCODONE-ACETAMINOPHEN 10-325 MG PO TABS: 1.0000 | ORAL_TABLET | Freq: Three times a day (TID) | ORAL | 0 refills | Status: DC | PRN

## 2016-06-28 MED ORDER — MORPHINE SULFATE ER 30 MG PO TBCR: 30.0000 mg | EXTENDED_RELEASE_TABLET | Freq: Two times a day (BID) | ORAL | 0 refills | Status: DC

## 2016-06-30 NOTE — Telephone Encounter (Signed)
done

## 2016-07-27 ENCOUNTER — Telehealth: Payer: Self-pay | Admitting: Family Medicine

## 2016-07-27 DIAGNOSIS — Q615 Medullary cystic kidney: Secondary | ICD-10-CM | POA: Diagnosis not present

## 2016-07-27 DIAGNOSIS — I10 Essential (primary) hypertension: Secondary | ICD-10-CM | POA: Diagnosis not present

## 2016-07-27 DIAGNOSIS — G8929 Other chronic pain: Secondary | ICD-10-CM

## 2016-07-27 DIAGNOSIS — M1A00X0 Idiopathic chronic gout, unspecified site, without tophus (tophi): Secondary | ICD-10-CM | POA: Diagnosis not present

## 2016-07-27 MED ORDER — MORPHINE SULFATE ER 30 MG PO TBCR: 30.0000 mg | EXTENDED_RELEASE_TABLET | Freq: Two times a day (BID) | ORAL | 0 refills | Status: DC

## 2016-07-27 MED ORDER — OXYCODONE-ACETAMINOPHEN 10-325 MG PO TABS: 1.0000 | ORAL_TABLET | Freq: Three times a day (TID) | ORAL | 0 refills | Status: DC | PRN

## 2016-07-27 NOTE — Telephone Encounter (Signed)
Last office note reviewed NCCSRS web site reviewed over last 12 months No red flags; no other prescribers Rxs approved

## 2016-07-28 NOTE — Telephone Encounter (Signed)
LMOM to inform pt °

## 2016-08-04 ENCOUNTER — Encounter: Payer: Self-pay | Admitting: Family Medicine

## 2016-08-11 ENCOUNTER — Other Ambulatory Visit: Payer: Self-pay | Admitting: Family Medicine

## 2016-08-11 NOTE — Telephone Encounter (Signed)
Chart reviewed Amlodipine approved He should not be out of furosemide; the pharmacy requested old instructions; he is now on every other day, 90 day supply send 06/05/16 so he shouldn't be out

## 2016-08-12 ENCOUNTER — Other Ambulatory Visit: Payer: Self-pay | Admitting: Family Medicine

## 2016-08-12 NOTE — Telephone Encounter (Signed)
Patient's kidney function significantly declined the last time Please let the patient know that I'm going to respectfully ask that the nephrologist adjust and refill the following medicines going forward: Losartan, furosemide, Uloric, and amlodipine Have him contact his kidney doctor for the losartan refill and others in the future Thank you

## 2016-08-14 NOTE — Telephone Encounter (Signed)
Patient notified

## 2016-08-28 ENCOUNTER — Telehealth: Payer: Self-pay | Admitting: Family Medicine

## 2016-08-28 DIAGNOSIS — G894 Chronic pain syndrome: Secondary | ICD-10-CM

## 2016-08-28 NOTE — Telephone Encounter (Signed)
Pt needs refill on Oxycodone and Morphine.

## 2016-08-29 MED ORDER — OXYCODONE-ACETAMINOPHEN 10-325 MG PO TABS: 1.0000 | ORAL_TABLET | Freq: Three times a day (TID) | ORAL | 0 refills | Status: DC | PRN

## 2016-08-29 MED ORDER — MORPHINE SULFATE ER 30 MG PO TBCR: 30.0000 mg | EXTENDED_RELEASE_TABLET | Freq: Two times a day (BID) | ORAL | 0 refills | Status: DC

## 2016-08-29 NOTE — Telephone Encounter (Signed)
NCCSRS web site reviewed Rxs are ready for pick up today

## 2016-09-05 ENCOUNTER — Ambulatory Visit: Payer: Medicare Other | Admitting: Family Medicine

## 2016-09-19 ENCOUNTER — Ambulatory Visit: Payer: Medicare Other | Admitting: Family Medicine

## 2016-09-19 ENCOUNTER — Encounter: Payer: Self-pay | Admitting: Family Medicine

## 2016-09-19 ENCOUNTER — Ambulatory Visit (INDEPENDENT_AMBULATORY_CARE_PROVIDER_SITE_OTHER): Payer: Medicare Other | Admitting: Family Medicine

## 2016-09-19 VITALS — BP 128/70 | HR 90 | Temp 98.1°F | Resp 14 | Ht 68.0 in | Wt 243.4 lb

## 2016-09-19 DIAGNOSIS — N183 Chronic kidney disease, stage 3 unspecified: Secondary | ICD-10-CM

## 2016-09-19 DIAGNOSIS — G894 Chronic pain syndrome: Secondary | ICD-10-CM | POA: Diagnosis not present

## 2016-09-19 DIAGNOSIS — Z6837 Body mass index (BMI) 37.0-37.9, adult: Secondary | ICD-10-CM | POA: Diagnosis not present

## 2016-09-19 DIAGNOSIS — E6609 Other obesity due to excess calories: Secondary | ICD-10-CM

## 2016-09-19 DIAGNOSIS — Z79899 Other long term (current) drug therapy: Secondary | ICD-10-CM | POA: Diagnosis not present

## 2016-09-19 DIAGNOSIS — Z720 Tobacco use: Secondary | ICD-10-CM

## 2016-09-19 MED ORDER — MORPHINE SULFATE ER 30 MG PO TBCR: 30.0000 mg | EXTENDED_RELEASE_TABLET | Freq: Two times a day (BID) | ORAL | 0 refills | Status: DC

## 2016-09-19 MED ORDER — OXYCODONE-ACETAMINOPHEN 10-325 MG PO TABS: 1.0000 | ORAL_TABLET | Freq: Three times a day (TID) | ORAL | 0 refills | Status: DC | PRN

## 2016-09-19 NOTE — Progress Notes (Signed)
BP 128/70 (BP Location: Left Arm, Patient Position: Sitting, Cuff Size: Normal)   Pulse 90   Temp 98.1 F (36.7 C) (Oral)   Resp 14   Ht _0  (1.727 m)   Wt 243 lb 6 oz (110.4 kg)   SpO2 97%   BMI 37.01 kg/m    Subjective:    Patient ID: Nathan Ramos, male    DOB: 1971/09/18, 45 y.o.   MRN: [id number]  HPI: Nathan Ramos is a 45 y.o. male  Chief Complaint  Patient presents with  . Medication Refill   Patient is here for chronic pain follow-up He is taking long-acting MS Contin every 12 hours, and uses oxycodone 10/325 up to twice a day for breakthrough if needed He has been stable on this regimen for over a year The medicine helps him to function; he is able to be more active with the medicine He does not suffer any adverse symptoms from the medicine; it does not make him feel drunk or goofy or loopy He is unable to be NSAIDs of any kind because of his chronic renal disease; his last creatinine was 2.77, BUN was 54 (June 05, 2016); he sees a nephrologist for his CKD He does not drink alcohol; he is aware that he should not start, and cannot drink alcohol with his pain medicine He keeps his medicine locked up at home He brought his pills in for a pill count; MS Contin purple pills, 20 in the bottle; oxycodone white pills, about 19-20 in the bottle (I did not take them out) He is in school, hoping to graduate in a few months No plans in the near future yet for his right hand/wrist; sees ortho at [facility]  Depression screen [facility] 2/9 09/19/2016 06/05/2016 02/22/2016  Decreased Interest 0 0 0  Down, Depressed, Hopeless 0 0 0  PHQ - 2 Score 0 0 0   NCCSRS web site reviewed; last fill of his pain medicines were each on10/18/17  Relevant past medical, surgical, family and social history reviewed Past Medical History:  Diagnosis Date  . Anemia    of chronic renal disease  . Bone spur of other site    right shoulder, managed by ortho  . Chronic pain    Destry Lovena Le CFNP  at [facility]  . CKD (chronic kidney disease)    stage III 03/2014 (Dr. Murlean Iba)  . Controlled substance agreement signed    signed 06/02/15  . Gout   . Gouty arthritis    knee, managed by Ortho  . Hyperlipidemia   . Hypertension   . IFG (impaired fasting glucose)   . Left knee DJD   . 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 Primary localized osteoarthritis of right knee   . Smoker    Past Surgical History:  Procedure Laterality Date  . CARPAL TUNNEL RELEASE Left   . CATARACT EXTRACTION W/ INTRAOCULAR LENS IMPLANT Left 2008  . CATARACT EXTRACTION W/ INTRAOCULAR LENS IMPLANT Right 2008  . EYE SURGERY    . HERNIA REPAIR    . JOINT REPLACEMENT Left 03/2014  . TOTAL KNEE ARTHROPLASTY Left 03/30/2014   Procedure: TOTAL KNEE ARTHROPLASTY;  Surgeon: Lorn Junes, MD;  Location: [place];  Service: Orthopedics;  Laterality: Left;  . TOTAL KNEE ARTHROPLASTY Right 10/26/2014   Procedure: TOTAL KNEE ARTHROPLASTY;  Surgeon: Lorn Junes, MD;  Location: [place];  Service: Orthopedics;  Laterality: Right;  .  UMBILICAL HERNIA REPAIR  2010   Social History  Substance Use Topics  . Smoking status: Current Every Day Smoker    Packs/day: 0.50    Years: 22.00    Types: Cigarettes  . Smokeless tobacco: Never Used  . Alcohol use No  not smoking as much as before, down to 1/2 ppd; smoking lites; getting closer to quitting  Interim medical history since last visit reviewed. Allergies and medications reviewed  Review of Systems Per HPI unless specifically indicated above     Objective:    BP 128/70 (BP Location: Left Arm, Patient Position: Sitting, Cuff Size: Normal)   Pulse 90   Temp 98.1 F (36.7 C) (Oral)   Resp 14   Ht _0  (1.727 m)   Wt 243 lb 6 oz (110.4 kg)   SpO2 97%   BMI 37.01 kg/m   Wt Readings from Last 3 Encounters:  09/19/16 243 lb 6 oz (110.4 kg)  06/05/16 242 lb (109.8 kg)  02/22/16 242 lb (109.8 kg)      Physical Exam  Constitutional: He appears well-developed and well-nourished.  Eyes: No scleral icterus.  Cardiovascular: Normal rate and regular rhythm.   Pulmonary/Chest: Effort normal and breath sounds normal.  Skin: No pallor.  Psychiatric: He has a normal mood and affect.   Results for orders placed or performed in visit on 06/05/16  Uric acid  Result Value Ref Range   Uric Acid, Serum 6.9 4.0 - 8.0 mg/dL  Lipid panel  Result Value Ref Range   Cholesterol 196 125 - 200 mg/dL   Triglycerides 840 (H) <150 mg/dL   HDL 21 (L) >=40 mg/dL   Total CHOL/HDL Ratio 9.3 (H) <=5.0 Ratio   VLDL NOT CALC <30 mg/dL   LDL Cholesterol NOT CALC <130 mg/dL  CBC with Differential/Platelet  Result Value Ref Range   WBC 7.4 3.8 - 10.8 K/uL   RBC 3.95 (L) 4.20 - 5.80 MIL/uL   Hemoglobin 11.3 (L) 13.2 - 17.1 g/dL   HCT 33.6 (L) 38.5 - 50.0 %   MCV 85.1 80.0 - 100.0 fL   MCH 28.6 27.0 - 33.0 pg   MCHC 33.6 32.0 - 36.0 g/dL   RDW 14.2 11.0 - 15.0 %   Platelets 241 140 - 400 K/uL   MPV 10.7 7.5 - 12.5 fL   Neutro Abs 3,552 1,500 - 7,800 cells/uL   Lymphs Abs 2,738 850 - 3,900 cells/uL   Monocytes Absolute 666 200 - 950 cells/uL   Eosinophils Absolute 370 15 - 500 cells/uL   Basophils Absolute 74 0 - 200 cells/uL   Neutrophils Relative % 48 %   Lymphocytes Relative 37 %   Monocytes Relative 9 %   Eosinophils Relative 5 %   Basophils Relative 1 %   Smear Review Criteria for review not met   Hemoglobin A1c  Result Value Ref Range   Hgb A1c MFr Bld 5.9 (H) <5.7 %   Mean Plasma Glucose 123 mg/dL  Comprehensive metabolic panel  Result Value Ref Range   Sodium 136 135 - 146 mmol/L   Potassium 4.2 3.5 - 5.3 mmol/L   Chloride 103 98 - 110 mmol/L   CO2 21 20 - 31 mmol/L   Glucose, Bld 87 65 - 99 mg/dL   BUN 54 (H) 7 - 25 mg/dL   Creat 2.77 (H) 0.60 - 1.35 mg/dL   Total Bilirubin 0.2 0.2 - 1.2 mg/dL   Alkaline Phosphatase 76 40 - 115 U/L   AST 16  10 - 40 U/L   ALT 16 9 - 46 U/L   Total  Protein 7.1 6.1 - 8.1 g/dL   Albumin 4.3 3.6 - 5.1 g/dL   Calcium 9.1 8.6 - 10.3 mg/dL      Assessment & Plan:   Problem List Items Addressed This Visit      Genitourinary   CKD (chronic kidney disease)    Patient followed by Dr. Murlean Iba; unable to use NSAIDs because of his kidney function; last creatinine 2.77        Other   Tobacco abuse    Encouraged patient to keep working towards complete smoking cessation; he does not sound ready to completely quit right now      Obesity - Primary    Encouraged patient to work on weight loss      Controlled substance agreement signed    [employer] site reviewed; no other prescribers; no red flags; UDS collected today per protocol; one month of med provided while awaiting results; if normal, can see him every 3 months with fills in between      Chronic pain syndrome    Continue current regimen; patient not interested in tapering right now; I inherited him from previous provider and have managed to wean him down to current dose; no adverse effects from medicine; helps him function; [place] web site reviewed; UDS collected today; cannot use NSAIDs at all because of poor renal function, medullary cystic kidney disease; will continue to see patient every 3 months      Relevant Medications   oxyCODONE-acetaminophen (PERCOCET) 10-325 MG tablet      Follow up plan: Return in about 3 months (around 12/20/2016).  An after-visit summary was printed and given to the patient at [place].  Please see the patient instructions which may contain other information and recommendations beyond what is mentioned above in the assessment and plan.  Meds ordered this encounter  Medications  . morphine (MS CONTIN) 30 MG 12 hr tablet    Sig: Take 1 tablet (30 mg total) by mouth every 12 (twelve) hours.    Dispense:  60 tablet    Refill:  0    Fill on or after 09/28/16  . oxyCODONE-acetaminophen (PERCOCET) 10-325 MG tablet    Sig: Take 1 tablet by  mouth every 8 (eight) hours as needed for pain (max two pills per 24 hours).    Dispense:  60 tablet    Refill:  0    Fill on or after October 28, 2016    No orders of the defined types were placed in this encounter.

## 2016-09-19 NOTE — Assessment & Plan Note (Signed)
NCCSRS web site reviewed; no other prescribers; no red flags; UDS collected today per protocol; one month of med provided while awaiting results; if normal, can see him every 3 months with fills in between

## 2016-09-19 NOTE — Assessment & Plan Note (Signed)
Encouraged patient to work on weight loss 

## 2016-09-19 NOTE — Assessment & Plan Note (Signed)
Patient followed by Dr. Mosetta PigeonHarmeet Singh; unable to use NSAIDs because of his kidney function; last creatinine 2.77

## 2016-09-19 NOTE — Assessment & Plan Note (Signed)
Continue current regimen; patient not interested in tapering right now; I inherited him from previous provider and have managed to wean him down to current dose; no adverse effects from medicine; helps him function; NCCSRS web site reviewed; UDS collected today; cannot use NSAIDs at all because of poor renal function, medullary cystic kidney disease; will continue to see patient every 3 months

## 2016-09-19 NOTE — Assessment & Plan Note (Signed)
Encouraged patient to keep working towards complete smoking cessation; he does not sound ready to completely quit right now

## 2016-09-19 NOTE — Patient Instructions (Signed)
Return in 3 months for next follow-up Continue to try to smoke less and work towards quitting Continue to try to lose weight, limit portions, drink enough water

## 2016-09-27 ENCOUNTER — Encounter: Payer: Self-pay | Admitting: Family Medicine

## 2016-10-25 ENCOUNTER — Other Ambulatory Visit: Payer: Self-pay | Admitting: Family Medicine

## 2016-10-25 DIAGNOSIS — G894 Chronic pain syndrome: Secondary | ICD-10-CM

## 2016-10-25 MED ORDER — MORPHINE SULFATE ER 30 MG PO TBCR: 30.0000 mg | EXTENDED_RELEASE_TABLET | Freq: Two times a day (BID) | ORAL | 0 refills | Status: DC

## 2016-10-25 MED ORDER — OXYCODONE-ACETAMINOPHEN 10-325 MG PO TABS: 1.0000 | ORAL_TABLET | Freq: Three times a day (TID) | ORAL | 0 refills | Status: DC | PRN

## 2016-10-25 NOTE — Telephone Encounter (Signed)
NCCSRS web site reviewed No other prescribers Last Rxs filled on 09/29/16 Normal (consistent) UDS 09/19/16 Okay for refill, will print now and sign on Friday Rxs will be ready for him to pick up Friday morning Thank you

## 2016-10-25 NOTE — Telephone Encounter (Signed)
Pt requesting refill on oxycodone and morphine. Would like to pick this up Friday.

## 2016-10-27 ENCOUNTER — Other Ambulatory Visit: Payer: Self-pay | Admitting: Family Medicine

## 2016-10-27 DIAGNOSIS — G894 Chronic pain syndrome: Secondary | ICD-10-CM

## 2016-10-27 MED ORDER — OXYCODONE-ACETAMINOPHEN 10-325 MG PO TABS: 1.0000 | ORAL_TABLET | Freq: Three times a day (TID) | ORAL | 0 refills | Status: DC | PRN

## 2016-10-27 MED ORDER — MORPHINE SULFATE ER 30 MG PO TBCR: 30.0000 mg | EXTENDED_RELEASE_TABLET | Freq: Two times a day (BID) | ORAL | 0 refills | Status: DC

## 2016-10-27 NOTE — Telephone Encounter (Signed)
Tried contacting patient voicemail full

## 2016-10-27 NOTE — Progress Notes (Signed)
Rx ready.

## 2016-11-02 ENCOUNTER — Other Ambulatory Visit: Payer: Self-pay | Admitting: Family Medicine

## 2016-11-09 ENCOUNTER — Other Ambulatory Visit: Payer: Self-pay

## 2016-11-09 MED ORDER — AMLODIPINE BESYLATE 10 MG PO TABS: 10.0000 mg | ORAL_TABLET | Freq: Every day | ORAL | 1 refills | Status: DC

## 2016-11-09 NOTE — Telephone Encounter (Signed)
rx approved

## 2016-11-24 ENCOUNTER — Other Ambulatory Visit: Payer: Self-pay | Admitting: Family Medicine

## 2016-11-24 ENCOUNTER — Telehealth: Payer: Self-pay | Admitting: Family Medicine

## 2016-11-24 DIAGNOSIS — G894 Chronic pain syndrome: Secondary | ICD-10-CM

## 2016-11-24 MED ORDER — MORPHINE SULFATE ER 30 MG PO TBCR: 30.0000 mg | EXTENDED_RELEASE_TABLET | Freq: Two times a day (BID) | ORAL | 0 refills | Status: DC

## 2016-11-24 MED ORDER — OXYCODONE-ACETAMINOPHEN 10-325 MG PO TABS: 1.0000 | ORAL_TABLET | Freq: Three times a day (TID) | ORAL | 0 refills | Status: DC | PRN

## 2016-11-24 NOTE — Telephone Encounter (Signed)
Dr Sherie DonLada pt is in need of a refill for Morphine and Oxycodone. Please advise.

## 2016-11-24 NOTE — Telephone Encounter (Signed)
Dr. Carlynn PurlSowles will give patient 7 pills of each until Dr. Sherie DonLada is back in the office. Please notify patient his prescription will be up front ready for pick up. Thanks

## 2016-11-27 NOTE — Telephone Encounter (Signed)
Pt.notified

## 2016-12-01 ENCOUNTER — Telehealth: Payer: Self-pay | Admitting: Family Medicine

## 2016-12-01 DIAGNOSIS — G894 Chronic pain syndrome: Secondary | ICD-10-CM

## 2016-12-01 MED ORDER — OXYCODONE-ACETAMINOPHEN 10-325 MG PO TABS: 1.0000 | ORAL_TABLET | Freq: Three times a day (TID) | ORAL | 0 refills | Status: DC | PRN

## 2016-12-01 MED ORDER — MORPHINE SULFATE ER 30 MG PO TBCR: 30.0000 mg | EXTENDED_RELEASE_TABLET | Freq: Two times a day (BID) | ORAL | 0 refills | Status: DC

## 2016-12-01 NOTE — Telephone Encounter (Signed)
Prescriptions ready for pick up.

## 2016-12-01 NOTE — Telephone Encounter (Signed)
Pt verbally informed prescription is ready for pick up

## 2016-12-01 NOTE — Telephone Encounter (Signed)
Pt was given a short supply of oxycodone and morphine while you where out. He is now completely out and is requesting a refill.

## 2016-12-26 ENCOUNTER — Telehealth: Payer: Self-pay | Admitting: Family Medicine

## 2016-12-26 DIAGNOSIS — G894 Chronic pain syndrome: Secondary | ICD-10-CM

## 2016-12-26 NOTE — Telephone Encounter (Signed)
Requesting prescription refill on oxycodone and morphine. He would like to pick it up on Friday

## 2016-12-27 NOTE — Telephone Encounter (Signed)
He needs an appointment please See last office note:  Follow up plan: Return in about 3 months (around 12/20/2016).

## 2016-12-27 NOTE — Telephone Encounter (Signed)
Appointment made for Monday

## 2017-01-01 ENCOUNTER — Ambulatory Visit (INDEPENDENT_AMBULATORY_CARE_PROVIDER_SITE_OTHER): Payer: Medicare Other | Admitting: Family Medicine

## 2017-01-01 ENCOUNTER — Encounter: Payer: Self-pay | Admitting: Family Medicine

## 2017-01-01 VITALS — BP 120/78 | HR 98 | Temp 98.4°F | Resp 16 | Wt 236.2 lb

## 2017-01-01 DIAGNOSIS — R69 Illness, unspecified: Secondary | ICD-10-CM | POA: Diagnosis not present

## 2017-01-01 DIAGNOSIS — Q615 Medullary cystic kidney: Secondary | ICD-10-CM | POA: Diagnosis not present

## 2017-01-01 DIAGNOSIS — G8929 Other chronic pain: Secondary | ICD-10-CM | POA: Diagnosis not present

## 2017-01-01 DIAGNOSIS — N2889 Other specified disorders of kidney and ureter: Secondary | ICD-10-CM | POA: Diagnosis not present

## 2017-01-01 DIAGNOSIS — R7301 Impaired fasting glucose: Secondary | ICD-10-CM | POA: Diagnosis not present

## 2017-01-01 DIAGNOSIS — M109 Gout, unspecified: Secondary | ICD-10-CM | POA: Diagnosis not present

## 2017-01-01 DIAGNOSIS — Z5181 Encounter for therapeutic drug level monitoring: Secondary | ICD-10-CM | POA: Diagnosis not present

## 2017-01-01 DIAGNOSIS — G894 Chronic pain syndrome: Secondary | ICD-10-CM | POA: Diagnosis not present

## 2017-01-01 DIAGNOSIS — E781 Pure hyperglyceridemia: Secondary | ICD-10-CM | POA: Diagnosis not present

## 2017-01-01 DIAGNOSIS — J111 Influenza due to unidentified influenza virus with other respiratory manifestations: Secondary | ICD-10-CM

## 2017-01-01 DIAGNOSIS — I151 Hypertension secondary to other renal disorders: Secondary | ICD-10-CM | POA: Diagnosis not present

## 2017-01-01 LAB — COMPREHENSIVE METABOLIC PANEL
ALT: 21 U/L (ref 9–46)
AST: 19 U/L (ref 10–40)
Albumin: 4.3 g/dL (ref 3.6–5.1)
Alkaline Phosphatase: 80 U/L (ref 40–115)
BUN: 46 mg/dL — ABNORMAL HIGH (ref 7–25)
CO2: 24 mmol/L (ref 20–31)
Calcium: 9.2 mg/dL (ref 8.6–10.3)
Chloride: 107 mmol/L (ref 98–110)
Creat: 2.21 mg/dL — ABNORMAL HIGH (ref 0.60–1.35)
Glucose, Bld: 105 mg/dL — ABNORMAL HIGH (ref 65–99)
Potassium: 4.9 mmol/L (ref 3.5–5.3)
Sodium: 140 mmol/L (ref 135–146)
Total Bilirubin: 0.3 mg/dL (ref 0.2–1.2)
Total Protein: 7.5 g/dL (ref 6.1–8.1)

## 2017-01-01 LAB — LIPID PANEL
Cholesterol: 154 mg/dL (ref <200)
HDL: 19 mg/dL — ABNORMAL LOW (ref >40)
Total CHOL/HDL Ratio: 8.1 Ratio — ABNORMAL HIGH (ref <5.0)
Triglycerides: 465 mg/dL — ABNORMAL HIGH (ref <150)

## 2017-01-01 LAB — HEMOGLOBIN A1C
Hgb A1c MFr Bld: 5.4 % (ref <5.7)
Mean Plasma Glucose: 108 mg/dL

## 2017-01-01 MED ORDER — MORPHINE SULFATE ER 30 MG PO TBCR: 30.0000 mg | EXTENDED_RELEASE_TABLET | Freq: Two times a day (BID) | ORAL | 0 refills | Status: DC

## 2017-01-01 MED ORDER — OXYCODONE-ACETAMINOPHEN 10-325 MG PO TABS: 1.0000 | ORAL_TABLET | Freq: Three times a day (TID) | ORAL | 0 refills | Status: DC | PRN

## 2017-01-01 NOTE — Assessment & Plan Note (Signed)
Reviewed NCCSRS web site over the last 12 months; no red flags; offered referral to rheumatologist and pain specialist, and he politely declined both

## 2017-01-01 NOTE — Assessment & Plan Note (Signed)
Check lipids today; limit fried foods, starches

## 2017-01-01 NOTE — Assessment & Plan Note (Signed)
Check liver and kidney fucntion

## 2017-01-01 NOTE — Assessment & Plan Note (Signed)
Check A1c, limit sugary drinks and whites

## 2017-01-01 NOTE — Assessment & Plan Note (Signed)
Managed by nephrologist, Dr. Thedore MinsSingh; knows to avoid NSAIDs

## 2017-01-01 NOTE — Assessment & Plan Note (Signed)
Excellent control, limiting salt; seeing nephrologist

## 2017-01-01 NOTE — Patient Instructions (Addendum)
I do encourage you to quit smoking Call [phone] to sign up for smoking cessation classes You can call 1-800-QUIT-NOW to talk with a smoking cessation coach   Steps to Quit Smoking Smoking tobacco can be bad for your health. It can also affect almost every organ in your body. Smoking puts you and people around you at risk for many serious long-lasting (chronic) diseases. Quitting smoking is hard, but it is one of the best things that you can do for your health. It is never too late to quit. What are the benefits of quitting smoking? When you quit smoking, you lower your risk for getting serious diseases and conditions. They can include:  Lung cancer or lung disease.  Heart disease.  Stroke.  Heart attack.  Not being able to have children (infertility).  Weak bones (osteoporosis) and broken bones (fractures). If you have coughing, wheezing, and shortness of breath, those symptoms may get better when you quit. You may also get sick less often. If you are pregnant, quitting smoking can help to lower your chances of having a baby of low birth weight. What can I do to help me quit smoking? Talk with your doctor about what can help you quit smoking. Some things you can do (strategies) include:  Quitting smoking totally, instead of slowly cutting back how much you smoke over a period of time.  Going to in-person counseling. You are more likely to quit if you go to many counseling sessions.  Using resources and support systems, such as:  Online chats with a Social worker.  Phone quitlines.  Printed Furniture conservator/restorer.  Support groups or group counseling.  Text messaging programs.  Mobile phone apps or applications.  Taking medicines. Some of these medicines may have nicotine in them. If you are pregnant or breastfeeding, do not take any medicines to quit smoking unless your doctor says it is okay. Talk with your doctor about counseling or other things that can help you. Talk with  your doctor about using more than one strategy at the same time, such as taking medicines while you are also going to in-person counseling. This can help make quitting easier. What things can I do to make it easier to quit? Quitting smoking might feel very hard at first, but there is a lot that you can do to make it easier. Take these steps:  Talk to your family and friends. Ask them to support and encourage you.  Call phone quitlines, reach out to support groups, or work with a Social worker.  Ask people who smoke to not smoke around you.  Avoid places that make you want (trigger) to smoke, such as:  Bars.  Parties.  Smoke-break areas at work.  Spend time with people who do not smoke.  Lower the stress in your life. Stress can make you want to smoke. Try these things to help your stress:  Getting regular exercise.  Deep-breathing exercises.  Yoga.  Meditating.  Doing a body scan. To do this, close your eyes, focus on one area of your body at a time from head to toe, and notice which parts of your body are tense. Try to relax the muscles in those areas.  Download or buy apps on your mobile phone or tablet that can help you stick to your quit plan. There are many free apps, such as QuitGuide from the [employer] Office manager for Disease Control and Prevention). You can find more support from smokefree.gov and other websites. This information is not intended to replace  advice given to you by your health care provider. Make sure you discuss any questions you have with your health care provider. Document Released: 08/26/2009 Document Revised: 06/27/2016 Document Reviewed: 03/16/2015 Elsevier Interactive Patient Education  2017 [employer]

## 2017-01-01 NOTE — Progress Notes (Signed)
BP 120/78   Pulse 98   Temp 98.4 F (36.9 C) (Oral)   Resp 16   Wt 236 lb 3 oz (107.1 kg)   SpO2 96%   BMI 35.91 kg/m    Subjective:    Patient ID: Nathan Ramos, male    DOB: Apr 14, 1971, 46 y.o.   MRN: [id number]  HPI: Nathan Ramos is a 46 y.o. male  Chief Complaint  Patient presents with  . Influenza    body aches, cough, sore throat, fever, congestion for a week   . 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 Jefm Bryant; before he knew he had kidney disease; gout just went haywire; joints went swollen and puffy and red and was walking with a cane back then; had tophi come up; they tried several different medicines; "I was miserable" he says; doctor sent him to [facility] for a study for the medullary cystic kidney disease, DNA test He thinks all of that has a lot to do with the pain he has now; knees were bone on bone, still hurt despite the replacement; hands hurt too; he believes the pain medicine improves quality of life; he cannot close his fist first thing in the morning until he takes his medicine, helps control pain; no bad constipation; does not make him feel  loopy or goofy or over-medicated  Prediabetes; knows to avoid whites  High cholesterol; limiting fatty meats; not taking cholesterol pill; okay to check fasting labs today  I reviewed [place] web site   Depression screen [facility] 2/9 01/01/2017 09/19/2016 06/05/2016 02/22/2016  Decreased Interest 0 0 0 0  Down, Depressed, Hopeless 0 0 0 0  PHQ - 2 Score 0 0 0 0    No flowsheet data found.  Relevant past medical, surgical, family and social history reviewed Past Medical History:  Diagnosis Date  . Anemia    of chronic renal disease  . Bone spur of other site    right shoulder, managed by ortho  . Chronic pain    Destry Lovena Le CFNP at [facility]  . CKD (chronic kidney disease)    stage III 03/2014 (Dr. Murlean Iba)  . Controlled substance agreement signed    signed 06/02/15  . Gout   . Gouty arthritis    knee, managed by Ortho  . Hyperlipidemia   . Hypertension   . IFG (impaired fasting glucose)   . Left knee DJD   . 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 Primary localized osteoarthritis of right knee   . Smoker  Past Surgical History:  Procedure Laterality Date  . CARPAL TUNNEL RELEASE Left   . CATARACT EXTRACTION W/ INTRAOCULAR LENS IMPLANT Left 2008  . CATARACT EXTRACTION W/ INTRAOCULAR LENS IMPLANT Right 2008  . EYE SURGERY    . HERNIA REPAIR    . JOINT REPLACEMENT Left 03/2014  . TOTAL KNEE ARTHROPLASTY Left 03/30/2014   Procedure: TOTAL KNEE ARTHROPLASTY;  Surgeon: Lorn Junes, MD;  Location: [place];  Service: Orthopedics;  Laterality: Left;  . TOTAL KNEE ARTHROPLASTY Right 10/26/2014   Procedure: TOTAL KNEE ARTHROPLASTY;  Surgeon: Lorn Junes, MD;  Location: [place];  Service: Orthopedics;  Laterality: Right;  . UMBILICAL HERNIA REPAIR  2010   Family History  Problem Relation Age of Onset  . COPD Mother   . Kidney disease Mother   . Thyroid disease Mother   . Stroke Father 70   Social History  Substance  Use Topics  . Smoking status: Current Every Day Smoker    Packs/day: 0.50    Years: 22.00    Types: Cigarettes  . Smokeless tobacco: Never Used  . Alcohol use No   Interim medical history since last visit reviewed. Allergies and medications reviewed  Review of Systems Per HPI unless specifically indicated above     Objective:    BP 120/78   Pulse 98   Temp 98.4 F (36.9 C) (Oral)   Resp 16   Wt 236 lb 3 oz (107.1 kg)   SpO2 96%   BMI 35.91 kg/m   Wt Readings from Last 3 Encounters:  01/01/17 236 lb 3 oz (107.1 kg)  09/19/16 243 lb 6 oz (110.4 kg)  06/05/16 242 lb (109.8 kg)    Physical Exam  Constitutional: He appears well-developed and well-nourished.  HENT:  Head: Normocephalic and atraumatic.  Right Ear: Hearing, tympanic membrane, external ear and ear canal normal. Tympanic membrane is not erythematous. No middle ear effusion.  Left Ear: Hearing, tympanic membrane, external ear and ear canal normal. Tympanic membrane is not erythematous.  No middle ear effusion.  Nose: Rhinorrhea (clear) present.  Mouth/Throat: Oropharynx is clear and moist and mucous membranes are normal.  Eyes: Right conjunctiva is injected. Left conjunctiva is injected. No scleral icterus.  Injected bilaterally with "glassy" look, mildly excessive tearing  Cardiovascular: Normal rate and regular rhythm.   Pulmonary/Chest: Effort normal and breath sounds normal.  Lymphadenopathy:    He has no cervical adenopathy.  Skin: No pallor.  Psychiatric: He has a normal mood and affect. His mood appears not anxious. He does not exhibit a depressed mood.    Results for orders placed or performed in visit on 06/05/16  Uric acid  Result Value Ref Range   Uric Acid, Serum 6.9 4.0 - 8.0 mg/dL  Lipid panel  Result Value Ref Range   Cholesterol 196 125 - 200 mg/dL   Triglycerides 840 (H) <150 mg/dL   HDL 21 (L) >=40 mg/dL   Total CHOL/HDL Ratio 9.3 (H) <=5.0 Ratio   VLDL NOT CALC <30 mg/dL   LDL  Cholesterol NOT CALC <130 mg/dL  CBC with Differential/Platelet  Result Value Ref Range   WBC 7.4 3.8 - 10.8 K/uL   RBC 3.95 (L) 4.20 - 5.80 MIL/uL   Hemoglobin 11.3 (L) 13.2 - 17.1 g/dL   HCT 33.6 (L) 38.5 - 50.0 %   MCV 85.1 80.0 - 100.0 fL   MCH 28.6 27.0 - 33.0 pg   MCHC 33.6 32.0 - 36.0 g/dL   RDW 14.2 11.0 -  15.0 %   Platelets 241 140 - 400 K/uL   MPV 10.7 7.5 - 12.5 fL   Neutro Abs 3,552 1,500 - 7,800 cells/uL   Lymphs Abs 2,738 850 - 3,900 cells/uL   Monocytes Absolute 666 200 - 950 cells/uL   Eosinophils Absolute 370 15 - 500 cells/uL   Basophils Absolute 74 0 - 200 cells/uL   Neutrophils Relative % 48 %   Lymphocytes Relative 37 %   Monocytes Relative 9 %   Eosinophils Relative 5 %   Basophils Relative 1 %   Smear Review Criteria for review not met   Hemoglobin A1c  Result Value Ref Range   Hgb A1c MFr Bld 5.9 (H) <5.7 %   Mean Plasma Glucose 123 mg/dL  Comprehensive metabolic panel  Result Value Ref Range   Sodium 136 135 - 146 mmol/L   Potassium 4.2 3.5 - 5.3 mmol/L   Chloride 103 98 - 110 mmol/L   CO2 21 20 - 31 mmol/L   Glucose, Bld 87 65 - 99 mg/dL   BUN 54 (H) 7 - 25 mg/dL   Creat 2.77 (H) 0.60 - 1.35 mg/dL   Total Bilirubin 0.2 0.2 - 1.2 mg/dL   Alkaline Phosphatase 76 40 - 115 U/L   AST 16 10 - 40 U/L   ALT 16 9 - 46 U/L   Total Protein 7.1 6.1 - 8.1 g/dL   Albumin 4.3 3.6 - 5.1 g/dL   Calcium 9.1 8.6 - 10.3 mg/dL      Assessment & Plan:   Problem List Items Addressed This Visit      Cardiovascular and Mediastinum   Hypertension    Excellent control, limiting salt; seeing nephrologist      Relevant Orders   Comprehensive metabolic panel     Endocrine   IFG (impaired fasting glucose)    Check A1c, limit sugary drinks and whites      Relevant Orders   Hemoglobin A1c     Musculoskeletal and Integument   Gouty arthritis    Causes chronic pain; uric acid managed primarily by nephrologist, pain managed here      Relevant Medications     oxyCODONE-acetaminophen (PERCOCET) 10-325 MG tablet   morphine (MS CONTIN) 30 MG 12 hr tablet     Genitourinary   Medullary cystic disease of the kidney    Managed by nephrologist, Dr. Candiss Norse; knows to avoid NSAIDs        Other   Medication monitoring encounter    Check liver and kidney fucntion      Relevant Orders   Comprehensive metabolic panel   Hypertriglyceridemia    Check lipids today; limit fried foods, starches      Relevant Orders   Lipid panel   Chronic pain syndrome    Reviewed NCCSRS web site over the last 12 months; no red flags; offered referral to rheumatologist and pain specialist, and he politely declined both      Relevant Medications   oxyCODONE-acetaminophen (PERCOCET) 10-325 MG tablet    Other Visit Diagnoses    Influenza-like illness    -  Primary   no flu tests today in clinic; too late for Tamiflu; symtomatic care, reasons to go to ER or urgent care reviewed (worsening cough, secondary pneumonia)       Follow up plan: Return in about 3 months (around 03/31/2017).  An after-visit summary was printed and given to the patient at [facility] [place].  Please see the patient instructions which may contain other information  and recommendations beyond what is mentioned above in the assessment and plan.  Meds ordered this encounter  Medications  . oxyCODONE-acetaminophen (PERCOCET) 10-325 MG tablet    Sig: Take 1 tablet by mouth every 8 (eight) hours as needed for pain (max two pills per 24 hours).    Dispense:  60 tablet    Refill:  0    Fill at [facility], Phillip Heal  . morphine (MS CONTIN) 30 MG 12 hr tablet    Sig: Take 1 tablet (30 mg total) by mouth every 12 (twelve) hours.    Dispense:  60 tablet    Refill:  0    Fill at [facility], Phillip Heal    Orders Placed This Encounter  Procedures  . Hemoglobin A1c  . Lipid panel  . Comprehensive metabolic panel

## 2017-01-01 NOTE — Assessment & Plan Note (Signed)
Causes chronic pain; uric acid managed primarily by nephrologist, pain managed here

## 2017-01-08 ENCOUNTER — Encounter: Payer: Self-pay | Admitting: Family Medicine

## 2017-01-18 ENCOUNTER — Telehealth: Payer: Self-pay

## 2017-01-18 MED ORDER — PREDNISONE 10 MG PO TABS: ORAL_TABLET | ORAL | 0 refills | Status: DC

## 2017-01-18 NOTE — Telephone Encounter (Signed)
Patient called states he is having gout flare in foot, wrist and elbow and wants to see about getting rx for prednisone?

## 2017-01-18 NOTE — Telephone Encounter (Signed)
I spoke with patient; they usually do a taper; they give him taper; general doctor does the taper; left wrist and right elbow and right foot; he says his nephrologist is fine with prednisone taper for gout flares [place] yesterday I'll send in Rx

## 2017-01-29 ENCOUNTER — Other Ambulatory Visit: Payer: Self-pay

## 2017-01-29 ENCOUNTER — Telehealth: Payer: Self-pay | Admitting: Family Medicine

## 2017-01-29 DIAGNOSIS — G894 Chronic pain syndrome: Secondary | ICD-10-CM

## 2017-01-29 MED ORDER — OXYCODONE-ACETAMINOPHEN 10-325 MG PO TABS: 1.0000 | ORAL_TABLET | Freq: Three times a day (TID) | ORAL | 0 refills | Status: DC | PRN

## 2017-01-29 MED ORDER — MORPHINE SULFATE ER 30 MG PO TBCR: 30.0000 mg | EXTENDED_RELEASE_TABLET | Freq: Two times a day (BID) | ORAL | 0 refills | Status: DC

## 2017-01-29 NOTE — Telephone Encounter (Signed)
Requesting refill on oxycodone and morphine sulfate. Is not completely out. [phone]

## 2017-01-29 NOTE — Telephone Encounter (Signed)
NCCSRS web site reviewed Rxs printed and ready for pick up

## 2017-01-30 NOTE — Telephone Encounter (Signed)
lvm informing pt prescription is ready for pick up

## 2017-02-02 DIAGNOSIS — E559 Vitamin D deficiency, unspecified: Secondary | ICD-10-CM | POA: Diagnosis not present

## 2017-02-02 DIAGNOSIS — M1A00X0 Idiopathic chronic gout, unspecified site, without tophus (tophi): Secondary | ICD-10-CM | POA: Diagnosis not present

## 2017-02-02 DIAGNOSIS — I10 Essential (primary) hypertension: Secondary | ICD-10-CM | POA: Diagnosis not present

## 2017-02-02 DIAGNOSIS — Q615 Medullary cystic kidney: Secondary | ICD-10-CM | POA: Diagnosis not present

## 2017-02-06 ENCOUNTER — Encounter: Payer: Self-pay | Admitting: Family Medicine

## 2017-02-28 ENCOUNTER — Other Ambulatory Visit: Payer: Self-pay

## 2017-02-28 DIAGNOSIS — G894 Chronic pain syndrome: Secondary | ICD-10-CM

## 2017-03-02 MED ORDER — MORPHINE SULFATE ER 30 MG PO TBCR
30.0000 mg | EXTENDED_RELEASE_TABLET | Freq: Two times a day (BID) | ORAL | 0 refills | Status: DC
Start: 2017-03-02 — End: 2017-03-30

## 2017-03-02 MED ORDER — OXYCODONE-ACETAMINOPHEN 10-325 MG PO TABS: 1.0000 | ORAL_TABLET | Freq: Three times a day (TID) | ORAL | 0 refills | Status: DC | PRN

## 2017-03-02 NOTE — Telephone Encounter (Signed)
NCCSRS web site reviewed Prescriptions ready to pick up Thank you

## 2017-03-30 ENCOUNTER — Encounter: Payer: Self-pay | Admitting: Family Medicine

## 2017-03-30 ENCOUNTER — Ambulatory Visit (INDEPENDENT_AMBULATORY_CARE_PROVIDER_SITE_OTHER): Payer: Medicare Other | Admitting: Family Medicine

## 2017-03-30 DIAGNOSIS — Z79899 Other long term (current) drug therapy: Secondary | ICD-10-CM | POA: Diagnosis not present

## 2017-03-30 DIAGNOSIS — M109 Gout, unspecified: Secondary | ICD-10-CM | POA: Diagnosis not present

## 2017-03-30 DIAGNOSIS — Q615 Medullary cystic kidney: Secondary | ICD-10-CM | POA: Diagnosis not present

## 2017-03-30 DIAGNOSIS — M1030 Gout due to renal impairment, unspecified site: Secondary | ICD-10-CM | POA: Diagnosis not present

## 2017-03-30 DIAGNOSIS — E559 Vitamin D deficiency, unspecified: Secondary | ICD-10-CM | POA: Diagnosis not present

## 2017-03-30 DIAGNOSIS — G894 Chronic pain syndrome: Secondary | ICD-10-CM | POA: Diagnosis not present

## 2017-03-30 MED ORDER — MORPHINE SULFATE ER 30 MG PO TBCR: 30.0000 mg | EXTENDED_RELEASE_TABLET | Freq: Two times a day (BID) | ORAL | 0 refills | Status: DC

## 2017-03-30 MED ORDER — OXYCODONE-ACETAMINOPHEN 10-325 MG PO TABS: 1.0000 | ORAL_TABLET | Freq: Three times a day (TID) | ORAL | 0 refills | Status: DC | PRN

## 2017-03-30 NOTE — Progress Notes (Signed)
BP 122/76   Pulse 94   Temp 98.1 F (36.7 C) (Oral)   Resp 16   Wt 244 lb 14.4 oz (111.1 kg)   SpO2 97%   BMI 37.24 kg/m    Subjective:    Patient ID: Nathan Ramos, male    DOB: Nov 27, 1970, 46 y.o.   MRN: [id number]  HPI: ALDRIN Ramos is a 46 y.o. male  Chief Complaint  Patient presents with  . Medication Refill  . Follow-up   Patient is here for f/u HPI  CKD stage 3; he has seen kidney doctor not long ago; he prescribed him some Rx vitamin D 50k once a week; has to avoid NSAIDs and is doing do Hypertension; well-controlled today; not much salt; rarely takes allergy pills; 2 weeks ago eyes were bothering him, sneezing, nose running Gouty arthritis; tries to avoid gravies, [place], etc; tried tart cherries but did not really help; first gout attack was so bad he thought he broke his ankle; age 75; 3-5 flares a year; kidney doctor has written him a prednisone prescription to have on hand in case needed; no known gout in family, related to kidney disease Pain medicine follow-up; he is on a controlled substance contract, long-term narcotic therapy for chronic pain, gouty arthritis, unable to take NSAIDs because of his kidney disease; no bad constipation; does not make him goofy or loopy or drunk; no alcohol; he is not interested in tapering down on medicine at this time; he feels like the medicine improves his quality, "I know it does"  Depression screen [facility] 2/9 03/30/2017 01/01/2017 09/19/2016 06/05/2016 02/22/2016  Decreased Interest 0 0 0 0 0  Down, Depressed, Hopeless 0 0 0 0 0  PHQ - 2 Score 0 0 0 0 0   Relevant past medical, surgical, family and social history reviewed Past Medical History:  Diagnosis Date  . Anemia    of chronic renal disease  . Bone spur of other site    right shoulder, managed by ortho  . Chronic pain    Nathan Ramos CFNP at [facility]  . CKD (chronic kidney disease)    stage III 03/2014 (Dr. Mosetta Pigeon)  . Controlled substance  agreement signed    signed 06/02/15  . Gout   . Gouty arthritis    knee, managed by Ortho  . Hyperlipidemia   . Hypertension   . IFG (impaired fasting glucose)   . Left knee DJD   . 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 Primary localized osteoarthritis of right knee   . Smoker    Past Surgical History:  Procedure Laterality Date  . CARPAL TUNNEL RELEASE Left   . CATARACT EXTRACTION W/ INTRAOCULAR LENS IMPLANT Left 2008  . CATARACT EXTRACTION W/ INTRAOCULAR LENS IMPLANT Right 2008  . EYE SURGERY    . HERNIA REPAIR    . JOINT REPLACEMENT Left 03/2014  . TOTAL KNEE ARTHROPLASTY Left 03/30/2014   Procedure: TOTAL KNEE ARTHROPLASTY;  Surgeon: Nilda Simmer, MD;  Location: [facility];  Service: Orthopedics;  Laterality: Left;  . TOTAL KNEE ARTHROPLASTY Right 10/26/2014   Procedure: TOTAL KNEE ARTHROPLASTY;  Surgeon: Nilda Simmer, MD;  Location: [facility] OR;  Service: Orthopedics;  Laterality: Right;  . UMBILICAL HERNIA REPAIR  2010   Family History  Problem Relation Age of Onset  . COPD Mother   . Kidney disease Mother   . Thyroid disease Mother   . Stroke  Father [id number]  . COPD Maternal Grandmother    Social History   Social History  . Marital status: Married    Spouse name: N/A  . Number of children: N/A  . Years of education: N/A   Occupational History  . Not on file.   Social History Main Topics  . Smoking status: Current Every Day Smoker    Packs/day: 0.50    Years: 22.00    Types: Cigarettes  . Smokeless tobacco: Never Used  . Alcohol use No  . Drug use: No  . Sexual activity: Not on file   Other Topics Concern  . Not on file   Social History Narrative  . No narrative on file  MD: he is graduating today with an associate's degree  Interim medical history since last visit reviewed. Allergies and medications reviewed  Review of Systems Per HPI unless specifically indicated above     Objective:    BP 122/76   Pulse 94   Temp  98.1 F (36.7 C) (Oral)   Resp 16   Wt 244 lb 14.4 oz (111.1 kg)   SpO2 97%   BMI 37.24 kg/m   Wt Readings from Last 3 Encounters:  03/30/17 244 lb 14.4 oz (111.1 kg)  01/01/17 236 lb 3 oz (107.1 kg)  09/19/16 243 lb 6 oz (110.4 kg)    Physical Exam  Constitutional: He appears well-developed and well-nourished.  HENT:  Head: Normocephalic and atraumatic.  Right Ear: Hearing normal.  Left Ear: Hearing normal.  Nose: No rhinorrhea (clear).  Mouth/Throat: Oropharynx is clear and moist and mucous membranes are normal.  Eyes: No scleral icterus.  Cardiovascular: Normal rate and regular rhythm.   Pulmonary/Chest: Effort normal and breath sounds normal.  Skin: No pallor.  Multiple tattoos  Psychiatric: He has a normal mood and affect. His mood appears not anxious. He does not exhibit a depressed mood.  Good eye contact with examiner   Results for orders placed or performed in visit on 01/01/17  Hemoglobin A1c  Result Value Ref Range   Hgb A1c MFr Bld 5.4 <5.7 %   Mean Plasma Glucose 108 mg/dL  Lipid panel  Result Value Ref Range   Cholesterol 154 <200 mg/dL   Triglycerides 161465 (H) <150 mg/dL   HDL 19 (L) >09>40 mg/dL   Total CHOL/HDL Ratio 8.1 (H) <5.0 Ratio   VLDL NOT CALC <30 mg/dL   LDL Cholesterol NOT CALC <100 mg/dL  Comprehensive metabolic panel  Result Value Ref Range   Sodium 140 135 - 146 mmol/L   Potassium 4.9 3.5 - 5.3 mmol/L   Chloride 107 98 - 110 mmol/L   CO2 24 20 - 31 mmol/L   Glucose, Bld 105 (H) 65 - 99 mg/dL   BUN 46 (H) 7 - 25 mg/dL   Creat 6.042.21 (H) 5.400.60 - 1.35 mg/dL   Total Bilirubin 0.3 0.2 - 1.2 mg/dL   Alkaline Phosphatase 80 40 - 115 U/L   AST 19 10 - 40 U/L   ALT 21 9 - 46 U/L   Total Protein 7.5 6.1 - 8.1 g/dL   Albumin 4.3 3.6 - 5.1 g/dL   Calcium 9.2 8.6 - 98.110.3 mg/dL      Assessment & Plan:   Problem List Items Addressed This Visit      Musculoskeletal and Integument   Gouty arthritis    On chronic narcotics managed here at this  office; unable to use NSAIDs      Relevant Medications  morphine (MS CONTIN) 30 MG 12 hr tablet   oxyCODONE-acetaminophen (PERCOCET) 10-325 MG tablet     Genitourinary   Medullary cystic disease of the kidney    Managed by Dr. Thedore Mins, nephrologist; have to avoid NSAIDs        Other   Vitamin D deficiency    Managed by nephrologist; now on Rx ergocalciferol, likely related to kidney disease      Gout due to renal impairment    With arthritis; unable to use NSAIDs; has Rx for prednisone to use PRN per nephrologist      Controlled substance agreement signed    Consistent UDS Feb 2018; repeat today per routine; new contract signed for 2018; reviewed NCCSRS web site over the last year, no red flags; will see patient every 3 months, may get refills of medicine in between when due each month, today Rx written, anticipate next due on June 17th which is a Sunday, so future flag sent to remind me to have ready for June 15th; next will be due July 17th (a Tuesday)      Chronic pain syndrome    Controlled substance contract, seeing patient every 3 months; he is reminded today to never mix medicine with alcohol or other controlled substances; NCCSRS web site reviewed, no red flags; will provide refills one month at a time and see him every 3 months; patient is compliant with visits, consistent UDS, comfortable managing here rather than sending to pain clinic      Relevant Medications   oxyCODONE-acetaminophen (PERCOCET) 10-325 MG tablet       Follow up plan: Return in about 3 months (around 06/30/2017) for follow-up visit with Dr. Sherie Don.  An after-visit summary was printed and given to the patient at check-out.  Please see the patient instructions which may contain other information and recommendations beyond what is mentioned above in the assessment and plan.  Meds ordered this encounter  Medications  . Vitamin D, Ergocalciferol, (DRISDOL) 50000 units CAPS capsule    Sig: Take 50,000  Units by mouth once a week.     Refill:  0  . morphine (MS CONTIN) 30 MG 12 hr tablet    Sig: Take 1 tablet (30 mg total) by mouth every 12 (twelve) hours.    Dispense:  60 tablet    Refill:  0    Fill at [facility], [place]ree Ditto  . oxyCODONE-acetaminophen (PERCOCET) 10-325 MG tablet    Sig: Take 1 tablet by mouth every 8 (eight) hours as needed for pain (max two pills per 24 hours).    Dispense:  60 tablet    Refill:  0    Fill at [facility], [place] Ditto    No orders of the defined types were placed in this encounter.

## 2017-03-30 NOTE — Assessment & Plan Note (Signed)
On chronic narcotics managed here at this office; unable to use NSAIDs

## 2017-03-30 NOTE — Assessment & Plan Note (Signed)
Managed by nephrologist; now on Rx ergocalciferol, likely related to kidney disease

## 2017-03-30 NOTE — Patient Instructions (Addendum)
Try to use PLAIN allergy medicine without the decongestant Avoid: phenylephrine, phenylpropanolamine, and pseudoephredine  Try to follow the DASH guidelines (DASH stands for Dietary Approaches to Stop Hypertension) Try to limit the sodium in your diet.  Ideally, consume less than 1.5 grams (less than 1,500mg ) per day. Do not add salt when cooking or at the table.  Check the sodium amount on labels when shopping, and choose items lower in sodium when given a choice. Avoid or limit foods that already contain a lot of sodium. Eat a diet rich in fruits and vegetables and whole grains.

## 2017-03-30 NOTE — Assessment & Plan Note (Signed)
Managed by Dr. Thedore MinsSingh, nephrologist; have to avoid NSAIDs

## 2017-03-30 NOTE — Assessment & Plan Note (Signed)
Controlled substance contract, seeing patient every 3 months; he is reminded today to never mix medicine with alcohol or other controlled substances; NCCSRS web site reviewed, no red flags; will provide refills one month at a time and see him every 3 months; patient is compliant with visits, consistent UDS, comfortable managing here rather than sending to pain clinic

## 2017-03-30 NOTE — Assessment & Plan Note (Signed)
Consistent UDS Feb 2018; repeat today per routine; new contract signed for 2018; reviewed [place]CC[place] web site over the last year, no red flags; will see patient every 3 months, may get refills of medicine in between when due each month, today Rx written, anticipate next due on June 17th which is a Sunday, so future flag sent to remind me to have ready for June 15th; next will be due July 17th (a Tuesday)

## 2017-03-30 NOTE — Assessment & Plan Note (Signed)
With arthritis; unable to use NSAIDs; has Rx for prednisone to use PRN per nephrologist

## 2017-04-06 ENCOUNTER — Encounter: Payer: Self-pay | Admitting: Family Medicine

## 2017-04-30 ENCOUNTER — Telehealth: Payer: Self-pay | Admitting: Family Medicine

## 2017-04-30 DIAGNOSIS — G894 Chronic pain syndrome: Secondary | ICD-10-CM

## 2017-04-30 MED ORDER — MORPHINE SULFATE ER 30 MG PO TBCR: 30.0000 mg | EXTENDED_RELEASE_TABLET | Freq: Two times a day (BID) | ORAL | 0 refills | Status: DC

## 2017-04-30 MED ORDER — OXYCODONE-ACETAMINOPHEN 10-325 MG PO TABS: 1.0000 | ORAL_TABLET | Freq: Three times a day (TID) | ORAL | 0 refills | Status: DC | PRN

## 2017-04-30 NOTE — Telephone Encounter (Signed)
I will write on rx pad the four pills of each and notify patient that is ready for pick up.

## 2017-04-30 NOTE — Telephone Encounter (Signed)
[place]CCS[place] web site reviewed No red flags I am not in the office today, but I'm approved one month of Rx; controlled substance UTD, consistent UDS, UTD with visits I called to speak to patient He does need a refill today and I am unfortunately out of the office I will ask a colleague to write for just a few pills to get him through until I return Meanwhile, I have printed off Rxs for both his long-acting and breakthrough medicine and will wet sign those as soon as I am back ------------------------------------- Dr. Carlynn PurlSowles, would you be willing to write the patient for just FOUR pills of each of the oxycodone and MS Contin to get him through until I am back in the office? I have already printed the the sixty pills of each and he can pick that up Tuesday or Wednesday and just needs a few until I return Thank you

## 2017-05-01 ENCOUNTER — Telehealth: Payer: Self-pay

## 2017-05-01 NOTE — Telephone Encounter (Signed)
Call pt to inform him that his RX  Is ready to pick up. Not able to leave a voicemail.

## 2017-05-28 ENCOUNTER — Other Ambulatory Visit: Payer: Self-pay | Admitting: Family Medicine

## 2017-05-28 DIAGNOSIS — G894 Chronic pain syndrome: Secondary | ICD-10-CM

## 2017-05-28 NOTE — Telephone Encounter (Signed)
Pt is asking to pickup his rx for (pain medications) on or before the 18th of July for he is going out of town on the 18th but knows that it is not due till the 20th and says post dating them is ok.

## 2017-05-29 ENCOUNTER — Telehealth: Payer: Self-pay

## 2017-05-29 MED ORDER — MORPHINE SULFATE ER 30 MG PO TBCR: 30.0000 mg | EXTENDED_RELEASE_TABLET | Freq: Two times a day (BID) | ORAL | 0 refills | Status: DC

## 2017-05-29 MED ORDER — OXYCODONE-ACETAMINOPHEN 10-325 MG PO TABS: 1.0000 | ORAL_TABLET | Freq: Three times a day (TID) | ORAL | 0 refills | Status: DC | PRN

## 2017-05-29 NOTE — Telephone Encounter (Signed)
Ready for pick up Next Rx to be filled on or after July 01, 2017

## 2017-05-29 NOTE — Telephone Encounter (Addendum)
Pt vocimail is full and not able to leave a messaged. Pt Rx is ready for pick up

## 2017-05-29 NOTE — Addendum Note (Signed)
Addended by: LADA, Janit BernMELINDA P on: 05/29/2017 12:07 PM   Modules accepted: Orders

## 2017-06-29 ENCOUNTER — Encounter: Payer: Self-pay | Admitting: Family Medicine

## 2017-06-29 ENCOUNTER — Ambulatory Visit (INDEPENDENT_AMBULATORY_CARE_PROVIDER_SITE_OTHER): Payer: Medicare Other | Admitting: Family Medicine

## 2017-06-29 DIAGNOSIS — N2889 Other specified disorders of kidney and ureter: Secondary | ICD-10-CM

## 2017-06-29 DIAGNOSIS — Z79899 Other long term (current) drug therapy: Secondary | ICD-10-CM | POA: Diagnosis not present

## 2017-06-29 DIAGNOSIS — Z6836 Body mass index (BMI) 36.0-36.9, adult: Secondary | ICD-10-CM

## 2017-06-29 DIAGNOSIS — E781 Pure hyperglyceridemia: Secondary | ICD-10-CM

## 2017-06-29 DIAGNOSIS — E6609 Other obesity due to excess calories: Secondary | ICD-10-CM

## 2017-06-29 DIAGNOSIS — G894 Chronic pain syndrome: Secondary | ICD-10-CM | POA: Diagnosis not present

## 2017-06-29 DIAGNOSIS — M109 Gout, unspecified: Secondary | ICD-10-CM | POA: Diagnosis not present

## 2017-06-29 DIAGNOSIS — M1030 Gout due to renal impairment, unspecified site: Secondary | ICD-10-CM | POA: Diagnosis not present

## 2017-06-29 DIAGNOSIS — Z5181 Encounter for therapeutic drug level monitoring: Secondary | ICD-10-CM | POA: Diagnosis not present

## 2017-06-29 DIAGNOSIS — Q615 Medullary cystic kidney: Secondary | ICD-10-CM

## 2017-06-29 DIAGNOSIS — E559 Vitamin D deficiency, unspecified: Secondary | ICD-10-CM | POA: Diagnosis not present

## 2017-06-29 DIAGNOSIS — Z720 Tobacco use: Secondary | ICD-10-CM | POA: Diagnosis not present

## 2017-06-29 DIAGNOSIS — R7301 Impaired fasting glucose: Secondary | ICD-10-CM

## 2017-06-29 DIAGNOSIS — D638 Anemia in other chronic diseases classified elsewhere: Secondary | ICD-10-CM

## 2017-06-29 DIAGNOSIS — I151 Hypertension secondary to other renal disorders: Secondary | ICD-10-CM

## 2017-06-29 DIAGNOSIS — G8929 Other chronic pain: Secondary | ICD-10-CM | POA: Diagnosis not present

## 2017-06-29 MED ORDER — OXYCODONE-ACETAMINOPHEN 10-325 MG PO TABS: 1.0000 | ORAL_TABLET | Freq: Three times a day (TID) | ORAL | 0 refills | Status: DC | PRN

## 2017-06-29 MED ORDER — MORPHINE SULFATE ER 30 MG PO TBCR: 30.0000 mg | EXTENDED_RELEASE_TABLET | Freq: Two times a day (BID) | ORAL | 0 refills | Status: DC

## 2017-06-29 NOTE — Assessment & Plan Note (Signed)
UTD agreement on file; last 3 urine drug screens all consistent; reviewed [place]CCS[place] web site over the last year; no concern for misuse or diversion; will see him every 3 months

## 2017-06-29 NOTE — Assessment & Plan Note (Signed)
Well controlled 

## 2017-06-29 NOTE — Assessment & Plan Note (Signed)
Chronic renal disease, monitored by kidney doctor; avoid extra iron in MV

## 2017-06-29 NOTE — Assessment & Plan Note (Signed)
Try dietary limitations, but with kidney function, will still build up

## 2017-06-29 NOTE — Assessment & Plan Note (Signed)
Monitored by nephrologist, Dr. Thedore Mins

## 2017-06-29 NOTE — Assessment & Plan Note (Signed)
Check liver function 

## 2017-06-29 NOTE — Assessment & Plan Note (Signed)
Monitored by kidney doctor 

## 2017-06-29 NOTE — Assessment & Plan Note (Addendum)
Check lipids; discussed dietary recommendations; encouraged weight loss

## 2017-06-29 NOTE — Assessment & Plan Note (Signed)
Encouragement given, will check thyroid function to see if slow metabolism

## 2017-06-29 NOTE — Assessment & Plan Note (Signed)
I am here to help if/when ready; he is not ready to quit

## 2017-06-29 NOTE — Assessment & Plan Note (Signed)
Check A1c and glucose; work on weight loss 

## 2017-06-29 NOTE — Assessment & Plan Note (Signed)
Chronic narcotics for pain control since he absolutely cannot use any NSAIDs due to kidney disease

## 2017-06-29 NOTE — Progress Notes (Signed)
BP 120/60   Pulse 98   Temp 98.2 F (36.8 C) (Oral)   Resp 16   Wt 241 lb 3.2 oz (109.4 kg)   SpO2 99%   BMI 36.67 kg/m    Subjective:    Patient ID: Nathan Ramos, male    DOB: Jul 19, 1971, 46 y.o.   MRN: [id number]  HPI: Nathan Ramos is a 46 y.o. male  Chief Complaint  Patient presents with  . Follow-up    3 months    HPI Chronic pain Compliant, here to 3 month follow-up; pain is constant, unchanged in quality Pain medicine improves quality of life; no bad side effects; no bad constipation; does not make him loopy or goofy or drunk  Gout; last flare was last week; not sure if arthritis or gout, actually; same spot as the bursa in the left hand; like someone was stabbing and swelled up; no different food; eating tons of cherries  CKD; seeing kidney doctor every 6 months  Still smoking; less than 1 ppd; not ready to quit; some stress  Prediabetes; no immediately family members with diabetes; no more dry mouth Lab Results  Component Value Date   HGBA1C 5.4 01/01/2017   Elevated TG; trying to limit fried foods; four eggs a week Lab Results  Component Value Date   CHOL 154 01/01/2017   CHOL 196 06/05/2016   CHOL 187 11/26/2015   Lab Results  Component Value Date   HDL 19 (L) 01/01/2017   HDL 21 (L) 06/05/2016   HDL 23 (L) 11/26/2015   Lab Results  Component Value Date   LDLCALC NOT CALC 01/01/2017   LDLCALC NOT CALC 06/05/2016   LDLCALC Comment 11/26/2015   Lab Results  Component Value Date   TRIG 465 (H) 01/01/2017   TRIG 840 (H) 06/05/2016   TRIG 538 (H) 11/26/2015   Lab Results  Component Value Date   CHOLHDL 8.1 (H) 01/01/2017   CHOLHDL 9.3 (H) 06/05/2016   No results found for: LDLDIRECT    Depression screen [facility] 2/9 06/29/2017 03/30/2017 01/01/2017 09/19/2016 06/05/2016  Decreased Interest 0 0 0 0 0  Down, Depressed, Hopeless 0 0 0 0 0  PHQ - 2 Score 0 0 0 0 0    Relevant past medical, surgical, family and social history reviewed Past  Medical History:  Diagnosis Date  . Anemia    of chronic renal disease  . Bone spur of other site    right shoulder, managed by ortho  . Chronic pain    [name] CFNP at [facility]  . CKD (chronic kidney disease)    stage III 03/2014 (Dr. Mosetta Pigeon)  . Controlled substance agreement signed    signed 06/02/15  . Gout   . Gouty arthritis    knee, managed by Ortho  . Hyperlipidemia   . Hypertension   . IFG (impaired fasting glucose)   . Left knee DJD   . 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 Primary localized osteoarthritis of right knee   . Smoker    Past Surgical History:  Procedure Laterality Date  . CARPAL TUNNEL RELEASE Left   . CATARACT EXTRACTION W/ INTRAOCULAR LENS IMPLANT Left 2008  . CATARACT EXTRACTION W/ INTRAOCULAR LENS IMPLANT Right 2008  . EYE SURGERY    . HERNIA REPAIR    . JOINT REPLACEMENT Left 03/2014  . TOTAL KNEE ARTHROPLASTY Left 03/30/2014   Procedure: TOTAL KNEE ARTHROPLASTY;  Surgeon: Nilda Simmer, MD;  Location: [facility] OR;  Service: Orthopedics;  Laterality: Left;  . TOTAL KNEE ARTHROPLASTY Right 10/26/2014   Procedure: TOTAL KNEE ARTHROPLASTY;  Surgeon: Nilda Simmer, MD;  Location: [facility] OR;  Service: Orthopedics;  Laterality: Right;  . UMBILICAL HERNIA REPAIR  2010   Family History  Problem Relation Age of Onset  . COPD Mother   . Kidney disease Mother   . Thyroid disease Mother   . Stroke Father 56  . COPD Maternal Grandmother    Social History   Social History  . Marital status: Married    Spouse name: N/A  . Number of children: N/A  . Years of education: N/A   Occupational History  . Not on file.   Social History Main Topics  . Smoking status: Current Every Day Smoker    Packs/day: 0.50    Years: 22.00    Types: Cigarettes  . Smokeless tobacco: Never Used  . Alcohol use No  . Drug use: No  . Sexual activity: Not Currently   Other Topics Concern  . Not on file    Social History Narrative  . No narrative on file    Interim medical history since last visit reviewed. Allergies and medications reviewed  Review of Systems Per HPI unless specifically indicated above     Objective:    BP 120/60   Pulse 98   Temp 98.2 F (36.8 C) (Oral)   Resp 16   Wt 241 lb 3.2 oz (109.4 kg)   SpO2 99%   BMI 36.67 kg/m   Wt Readings from Last 3 Encounters:  06/29/17 241 lb 3.2 oz (109.4 kg)  03/30/17 244 lb 14.4 oz (111.1 kg)  01/01/17 236 lb 3 oz (107.1 kg)    Physical Exam  Constitutional: He appears well-developed and well-nourished.  Obese, weight down 3+ pounds over last 3 months  HENT:  Head: Normocephalic and atraumatic.  Right Ear: Hearing normal.  Left Ear: Hearing normal.  Nose: No rhinorrhea (clear).  Mouth/Throat: Oropharynx is clear and moist and mucous membranes are normal.  Eyes: No scleral icterus.  Cardiovascular: Normal rate and regular rhythm.   Pulmonary/Chest: Effort normal and breath sounds normal.  Musculoskeletal:       Left hand: He exhibits no bony tenderness, no deformity and no swelling.  Skin: No pallor.  Multiple tattoos  Psychiatric: He has a normal mood and affect. His mood appears not anxious. He does not exhibit a depressed mood.  Good eye contact with examiner   Results for orders placed or performed in visit on 01/01/17  Hemoglobin A1c  Result Value Ref Range   Hgb A1c MFr Bld 5.4 <5.7 %   Mean Plasma Glucose 108 mg/dL  Lipid panel  Result Value Ref Range   Cholesterol 154 <200 mg/dL   Triglycerides 284 (H) <150 mg/dL   HDL 19 (L) >13 mg/dL   Total CHOL/HDL Ratio 8.1 (H) <5.0 Ratio   VLDL NOT CALC <30 mg/dL   LDL Cholesterol NOT CALC <100 mg/dL  Comprehensive metabolic panel  Result Value Ref Range   Sodium 140 135 - 146 mmol/L   Potassium 4.9 3.5 - 5.3 mmol/L   Chloride 107 98 - 110 mmol/L   CO2 24 20 - 31 mmol/L   Glucose, Bld 105 (H) 65 - 99 mg/dL   BUN 46 (H) 7 - 25 mg/dL   Creat 2.44 (H)  0.10 - 1.35 mg/dL   Total Bilirubin 0.3 0.2 - 1.2 mg/dL  Alkaline Phosphatase 80 40 - 115 U/L   AST 19 10 - 40 U/L   ALT 21 9 - 46 U/L   Total Protein 7.5 6.1 - 8.1 g/dL   Albumin 4.3 3.6 - 5.1 g/dL   Calcium 9.2 8.6 - 81.1 mg/dL      Assessment & Plan:   Problem List Items Addressed This Visit      Cardiovascular and Mediastinum   Hypertension    Well-controlled        Endocrine   IFG (impaired fasting glucose)    Check A1c and glucose; work on weight loss      Relevant Orders   Hemoglobin A1c     Musculoskeletal and Integument   Gouty arthritis    Chronic narcotics for pain control since he absolutely cannot use any NSAIDs due to kidney disease      Relevant Medications   predniSONE (DELTASONE) 10 MG tablet   morphine (MS CONTIN) 30 MG 12 hr tablet   oxyCODONE-acetaminophen (PERCOCET) 10-325 MG tablet     Genitourinary   Medullary cystic disease of the kidney    Monitored by nephrologist, Dr. Thedore Mins        Other   Vitamin D deficiency    Monitored by kidney doctor      Tobacco abuse    I am here to help if/when ready; he is not ready to quit      Obesity    Encouragement given, will check thyroid function to see if slow metabolism      Relevant Orders   TSH   Medication monitoring encounter    Check liver function      Relevant Orders   COMPLETE METABOLIC PANEL WITH GFR   Hypertriglyceridemia    Check lipids; discussed dietary recommendations; encouraged weight loss      Relevant Orders   Lipid panel   Gout due to renal impairment    Try dietary limitations, but with kidney function, will still build up      Controlled substance agreement signed    UTD agreement on file; last 3 urine drug screens all consistent; reviewed [place]CCS[place] web site over the last year; no concern for misuse or diversion; will see him every 3 months      Chronic pain syndrome    Controlled substance agreement on file; seeing patient every 3 months; has been  established, compliant; no concernes for misuse or diversion of medicine      Relevant Medications   oxyCODONE-acetaminophen (PERCOCET) 10-325 MG tablet   Anemia    Chronic renal disease, monitored by kidney doctor; avoid extra iron in MV          Follow up plan: Return in about 3 months (around 09/29/2017) for follow-up visit with Dr. Sherie Don.  An after-visit summary was printed and given to the patient at check-out.  Please see the patient instructions which may contain other information and recommendations beyond what is mentioned above in the assessment and plan.  Meds ordered this encounter  Medications  . predniSONE (DELTASONE) 10 MG tablet    Sig: Take 10 mg by mouth daily as needed.  Marland Kitchen DISCONTD: morphine (MS CONTIN) 30 MG 12 hr tablet    Sig: Take 1 tablet (30 mg total) by mouth every 12 (twelve) hours.    Dispense:  60 tablet    Refill:  0    Fill at [facility]; chronic pain, contract; reviewed [facility] site x 12 months  . DISCONTD: oxyCODONE-acetaminophen (PERCOCET) 10-325  MG tablet    Sig: Take 1 tablet by mouth every 8 (eight) hours as needed for pain (max two pills per 24 hours).    Dispense:  60 tablet    Refill:  0    Fill at [facility]; chronic pain, contract; reviewed [facility] site x 12 months  . DISCONTD: oxyCODONE-acetaminophen (PERCOCET) 10-325 MG tablet    Sig: Take 1 tablet by mouth every 8 (eight) hours as needed for pain (max two pills per 24 hours). Fill on or after Sept 17, 2018    Dispense:  60 tablet    Refill:  0    Fill at [facility]; chronic pain, contract; reviewed [facility] site x 12 months  . DISCONTD: morphine (MS CONTIN) 30 MG 12 hr tablet    Sig: Take 1 tablet (30 mg total) by mouth every 12 (twelve) hours. Fill on or after Sept 17, 2018    Dispense:  60 tablet    Refill:  0    Fill at [facility]; chronic pain, contract; reviewed [facility] site x 12 months  . morphine (MS CONTIN) 30 MG 12 hr tablet    Sig:  Take 1 tablet (30 mg total) by mouth every 12 (twelve) hours. Fill on or after Aug 29, 2017    Dispense:  60 tablet    Refill:  0    Fill at [facility]; chronic pain, contract; reviewed [facility] site x 12 months  . oxyCODONE-acetaminophen (PERCOCET) 10-325 MG tablet    Sig: Take 1 tablet by mouth every 8 (eight) hours as needed for pain (max two pills per 24 hours). Fill on or after Aug 29, 2017    Dispense:  60 tablet    Refill:  0    Fill at [facility], [place]; chronic pain, contract; reviewed [facility] site x 12 months    Orders Placed This Encounter  Procedures  . COMPLETE METABOLIC PANEL WITH GFR  . Hemoglobin A1c  . Lipid panel  . TSH

## 2017-06-29 NOTE — Assessment & Plan Note (Signed)
Controlled substance agreement on file; seeing patient every 3 months; has been established, compliant; no concernes for misuse or diversion of medicine

## 2017-06-29 NOTE — Patient Instructions (Addendum)
I'll suggest a multiple vitamin without iron, like a Centrum Silver; maybe no more than 4 to 5 days a week We'll contact you about the labs Return in 3 months Keep up the good work with weight loss; try to lose 5-10 more pounds before next visit

## 2017-07-06 DIAGNOSIS — Z5181 Encounter for therapeutic drug level monitoring: Secondary | ICD-10-CM | POA: Diagnosis not present

## 2017-07-06 DIAGNOSIS — R7301 Impaired fasting glucose: Secondary | ICD-10-CM | POA: Diagnosis not present

## 2017-07-06 DIAGNOSIS — E781 Pure hyperglyceridemia: Secondary | ICD-10-CM | POA: Diagnosis not present

## 2017-07-06 DIAGNOSIS — E6609 Other obesity due to excess calories: Secondary | ICD-10-CM | POA: Diagnosis not present

## 2017-07-06 DIAGNOSIS — Z6836 Body mass index (BMI) 36.0-36.9, adult: Secondary | ICD-10-CM | POA: Diagnosis not present

## 2017-07-06 DIAGNOSIS — I10 Essential (primary) hypertension: Secondary | ICD-10-CM | POA: Diagnosis not present

## 2017-07-07 LAB — COMPLETE METABOLIC PANEL WITH GFR
ALT: 17 U/L (ref 9–46)
AST: 16 U/L (ref 10–40)
Albumin: 4.5 g/dL (ref 3.6–5.1)
Alkaline Phosphatase: 84 U/L (ref 40–115)
BUN: 55 mg/dL — ABNORMAL HIGH (ref 7–25)
CO2: 20 mmol/L (ref 20–32)
Calcium: 10 mg/dL (ref 8.6–10.3)
Chloride: 105 mmol/L (ref 98–110)
Creat: 2.36 mg/dL — ABNORMAL HIGH (ref 0.60–1.35)
GFR, Est African American: 38 mL/min — ABNORMAL LOW (ref >=60)
GFR, Est Non African American: 32 mL/min — ABNORMAL LOW (ref >=60)
Glucose, Bld: 131 mg/dL — ABNORMAL HIGH (ref 65–99)
Potassium: 5.4 mmol/L — ABNORMAL HIGH (ref 3.5–5.3)
Sodium: 137 mmol/L (ref 135–146)
Total Bilirubin: 0.3 mg/dL (ref 0.2–1.2)
Total Protein: 7.5 g/dL (ref 6.1–8.1)

## 2017-07-07 LAB — LIPID PANEL
Cholesterol: 219 mg/dL — ABNORMAL HIGH (ref <200)
HDL: 30 mg/dL — ABNORMAL LOW (ref >40)
LDL Cholesterol: 116 mg/dL — ABNORMAL HIGH (ref <100)
Total CHOL/HDL Ratio: 7.3 Ratio — ABNORMAL HIGH (ref <5.0)
Triglycerides: 364 mg/dL — ABNORMAL HIGH (ref <150)
VLDL: 73 mg/dL — ABNORMAL HIGH (ref <30)

## 2017-07-07 LAB — HEMOGLOBIN A1C
Hgb A1c MFr Bld: 5.4 % (ref <5.7)
Mean Plasma Glucose: 108 mg/dL

## 2017-07-07 LAB — TSH: TSH: 0.92 mIU/L (ref 0.40–4.50)

## 2017-07-09 ENCOUNTER — Other Ambulatory Visit: Payer: Self-pay | Admitting: Family Medicine

## 2017-07-09 DIAGNOSIS — E781 Pure hyperglyceridemia: Secondary | ICD-10-CM

## 2017-07-09 MED ORDER — ATORVASTATIN CALCIUM 20 MG PO TABS: 20.0000 mg | ORAL_TABLET | Freq: Every day | ORAL | 1 refills | Status: DC

## 2017-07-09 NOTE — Assessment & Plan Note (Signed)
Start statin, recheck labs in 6-8 weeks, watch diet, lose weight

## 2017-07-09 NOTE — Progress Notes (Signed)
Start statin; weight loss and healthy eating encouraged; recheck lipids in 6-8 weeks

## 2017-07-12 ENCOUNTER — Encounter: Payer: Self-pay | Admitting: Family Medicine

## 2017-09-26 ENCOUNTER — Other Ambulatory Visit: Payer: Self-pay | Admitting: Family Medicine

## 2017-09-26 DIAGNOSIS — G894 Chronic pain syndrome: Secondary | ICD-10-CM

## 2017-09-26 NOTE — Telephone Encounter (Signed)
Copied from CRM [phone]. Topic: General - Other >> Sep 26, 2017  1:09 PM Raquel SarnaHayes, Teresa G wrote: Pt will run out of medications BEFORE Mon's appt.  - Will be out of medications tomorrow   Needs medications :  Morphine Sulfate Oxicodone

## 2017-09-27 MED ORDER — MORPHINE SULFATE ER 30 MG PO TBCR: 30.0000 mg | EXTENDED_RELEASE_TABLET | Freq: Two times a day (BID) | ORAL | 0 refills | Status: DC

## 2017-09-27 MED ORDER — OXYCODONE-ACETAMINOPHEN 10-325 MG PO TABS: 1.0000 | ORAL_TABLET | Freq: Three times a day (TID) | ORAL | 0 refills | Status: DC | PRN

## 2017-09-27 NOTE — Telephone Encounter (Signed)
Reviewed [place]CCS[place] web site No red flags Controlled substance agreement on chart UDS UTD and consistent Okay for refills to be picked up We'll see him next week

## 2017-09-27 NOTE — Telephone Encounter (Signed)
Patient notified of prescriptions at front desk.

## 2017-10-01 ENCOUNTER — Ambulatory Visit: Payer: Medicare Other | Admitting: Family Medicine

## 2017-10-11 ENCOUNTER — Ambulatory Visit (INDEPENDENT_AMBULATORY_CARE_PROVIDER_SITE_OTHER): Payer: Medicare Other | Admitting: Family Medicine

## 2017-10-11 ENCOUNTER — Encounter: Payer: Self-pay | Admitting: Family Medicine

## 2017-10-11 VITALS — BP 132/70 | HR 91 | Temp 97.8°F | Resp 16 | Wt 241.9 lb

## 2017-10-11 DIAGNOSIS — G894 Chronic pain syndrome: Secondary | ICD-10-CM | POA: Diagnosis not present

## 2017-10-11 DIAGNOSIS — F1190 Opioid use, unspecified, uncomplicated: Secondary | ICD-10-CM | POA: Insufficient documentation

## 2017-10-11 DIAGNOSIS — E6609 Other obesity due to excess calories: Secondary | ICD-10-CM

## 2017-10-11 DIAGNOSIS — R7301 Impaired fasting glucose: Secondary | ICD-10-CM

## 2017-10-11 DIAGNOSIS — E781 Pure hyperglyceridemia: Secondary | ICD-10-CM

## 2017-10-11 DIAGNOSIS — M65341 Trigger finger, right ring finger: Secondary | ICD-10-CM | POA: Diagnosis not present

## 2017-10-11 DIAGNOSIS — M109 Gout, unspecified: Secondary | ICD-10-CM | POA: Diagnosis not present

## 2017-10-11 DIAGNOSIS — Z79899 Other long term (current) drug therapy: Secondary | ICD-10-CM

## 2017-10-11 DIAGNOSIS — N2889 Other specified disorders of kidney and ureter: Secondary | ICD-10-CM

## 2017-10-11 DIAGNOSIS — Z5181 Encounter for therapeutic drug level monitoring: Secondary | ICD-10-CM | POA: Diagnosis not present

## 2017-10-11 DIAGNOSIS — Z6836 Body mass index (BMI) 36.0-36.9, adult: Secondary | ICD-10-CM

## 2017-10-11 DIAGNOSIS — Q615 Medullary cystic kidney: Secondary | ICD-10-CM | POA: Diagnosis not present

## 2017-10-11 DIAGNOSIS — M1030 Gout due to renal impairment, unspecified site: Secondary | ICD-10-CM

## 2017-10-11 DIAGNOSIS — I151 Hypertension secondary to other renal disorders: Secondary | ICD-10-CM | POA: Diagnosis not present

## 2017-10-11 NOTE — Assessment & Plan Note (Signed)
Check uric acid, last was over 6 here, and kidney doctor checked in March

## 2017-10-11 NOTE — Assessment & Plan Note (Signed)
Return fasting for lipids, goal TG less than 200; will see what the labs show and either start krill or fish oil or other agent

## 2017-10-11 NOTE — Progress Notes (Signed)
BP 132/70   Pulse 91   Temp 97.8 F (36.6 C)   Resp 16   Wt 241 lb 14.4 oz (109.7 kg)   SpO2 94%   BMI 36.78 kg/m    Subjective:    Patient ID: Nathan Ramos    DOB: 04/24/1971, 46 y.o.   MRN: [id number]  HPI: Nathan Ramos 46 y.o. Ramos  Chief Complaint  Patient presents with  . Follow-up    HPI Patient is here for f/u  Obesity: He thinks that 220 pounds would be Ramos healthy weight, after high school, wrestling team; then got up to 285 pounds between joint pain and prednisone, was not able to exercise for quite some time because of the joint problems He does not eat out much, kids will take his food Last thyroid was normal Does not drink enough water  He has chronic pain and has been seeing me for years for this; stable pain medicines, compliant with appointments, but did not keep his last appt with me, which is unusual for him; he says that he called that morning of his appointment, had to go to court that day; he reached Ramos recording; listened to the same recording for 15 minutes and no one ever answered, he even played the recording for me here that he reaches when trying to call the office; he called on speaker phone and indeed, it reached Ramos recording and no one answered Current pain level: 4 out of 10 Cold weather makes it worse; damp weather makes it worse Worst pain over the last 2 weeks: 8 out of 10 Best relief over the last 2 weeks: 4 out of 10 Right shoulder is still really bad; he had considered surgery, wanting to put that off as long as possible; trigger finger went away Knees are still clunky with walking; sometimes the left knee hyperextends sometimes when standing still; knocks when walkgin  High TG; eats some salmon; not sure about fam hx; no early heart disease in the men  Just taking prednisone occasionally; just five days every six months  He brought in his bottles, morphine sulfate ER 30 mg every 12 hours; 08/29/17 with one pill left Next  bottle 09/28/17 43 pills left Purplish, round, matching description  [facility] 10/11/2017 06/29/2017 03/30/2017 01/01/2017 09/19/2016  Decreased Interest 0 0 0 0 0  Down, Depressed, Hopeless 0 0 0 0 0  PHQ - 2 Score 0 0 0 0 0    Relevant past medical, surgical, family and social history reviewed Past Medical History:  Diagnosis Date  . Anemia    of chronic renal disease  . Bone spur of other site    right shoulder, managed by ortho  . Chronic pain    Destry Ladona Rid[facility]aylor [facility]  . CKD (chronic kidney disease)    stage III 03/2014 (Dr. Mosetta PigeonHarmeet Singh)  . Controlled substance agreement signed    signed 06/02/15  . Gout   . Gouty arthritis    knee, managed by Ortho  . Hyperlipidemia   . Hypertension   . IFG (impaired fasting glucose)   . Left knee DJD   . 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 Primary localized osteoarthritis of right knee   . Smoker    Past Surgical History:  Procedure Laterality Date  . CARPAL TUNNEL RELEASE Left   . CATARACT EXTRACTION W/ INTRAOCULAR LENS IMPLANT Left 2008  .  CATARACT EXTRACTION W/ INTRAOCULAR LENS IMPLANT Right 2008  . EYE SURGERY    . HERNIA REPAIR    . JOINT REPLACEMENT Left 03/2014  . TOTAL KNEE ARTHROPLASTY Left 03/30/2014   Procedure: TOTAL KNEE ARTHROPLASTY;  Surgeon: Nilda Simmer, MD;  Location: [facility] OR;  Service: Orthopedics;  Laterality: Left;  . TOTAL KNEE ARTHROPLASTY Right 10/26/2014   Procedure: TOTAL KNEE ARTHROPLASTY;  Surgeon: Nilda Simmer, MD;  Location: [facility] OR;  Service: Orthopedics;  Laterality: Right;  . UMBILICAL HERNIA REPAIR  2010   Family History  Problem Relation Age of Onset  . COPD Mother   . Kidney disease Mother   . Thyroid disease Mother   . Stroke Father 59  . COPD Maternal Grandmother    Social History   Tobacco Use  . Smoking status: Current Every Day Smoker    Packs/day: 0.50    Years: 22.00    Pack years: 11.00    Types:  Cigarettes  . Smokeless tobacco: Never Used  Substance Use Topics  . Alcohol use: No  . Drug use: No    Interim medical history since last visit reviewed. Allergies and medications reviewed  Review of Systems Per HPI unless specifically indicated above     Objective:    BP 132/70   Pulse 91   Temp 97.8 F (36.6 C)   Resp 16   Wt 241 lb 14.4 oz (109.7 kg)   SpO2 94%   BMI 36.78 kg/m   Wt Readings from Last 3 Encounters:  10/11/17 241 lb 14.4 oz (109.7 kg)  06/29/17 241 lb 3.2 oz (109.4 kg)  03/30/17 244 lb 14.4 oz (111.1 kg)    Physical Exam  Constitutional: He appears well-developed and well-nourished.  Obese, weight stable since last visit  HENT:  Head: Normocephalic and atraumatic.  Right Ear: Hearing normal.  Left Ear: Hearing normal.  Nose: No rhinorrhea (clear).  Mouth/Throat: Oropharynx is clear and moist and mucous membranes are normal.  Eyes: No scleral icterus.  Neck: No thyromegaly present.  Cardiovascular: Normal rate and regular rhythm.  Pulmonary/Chest: Effort normal and breath sounds normal. He has no wheezes.  Abdominal: He exhibits no distension.  Musculoskeletal:       Left hand: He exhibits no bony tenderness, no deformity and no swelling.  Skin: No pallor.  Multiple tattoos  Psychiatric: He has Ramos normal mood and affect. His behavior is normal. His mood appears not anxious. He does not exhibit Ramos depressed mood.  Good eye contact with examiner    Results for orders placed or performed in visit on 06/29/17  COMPLETE METABOLIC PANEL WITH GFR  Result Value Ref Range   Sodium 137 135 - 146 mmol/L   Potassium 5.4 (H) 3.5 - 5.3 mmol/L   Chloride 105 98 - 110 mmol/L   CO2 20 20 - 32 mmol/L   Glucose, Bld 131 (H) 65 - 99 mg/dL   BUN 55 (H) 7 - 25 mg/dL   Creat 4.40 (H) 1.02 - 1.35 mg/dL   Total Bilirubin 0.3 0.2 - 1.2 mg/dL   Alkaline Phosphatase 84 40 - 115 U/L   AST 16 10 - 40 U/L   ALT 17 9 - 46 U/L   Total Protein 7.5 6.1 - 8.1 g/dL    Albumin 4.5 3.6 - 5.1 g/dL   Calcium 72.5 8.6 - 36.6 mg/dL   GFR, Est African American 38 (L) >=60 mL/min   GFR, Est Non African American 32 (L) >=60 mL/min  Hemoglobin  A1c  Result Value Ref Range   Hgb A1c MFr Bld 5.4 <5.7 %   Mean Plasma Glucose 108 mg/dL  Lipid panel  Result Value Ref Range   Cholesterol 219 (H) <200 mg/dL   Triglycerides 981364 (H) <150 mg/dL   HDL 30 (L) >19>40 mg/dL   Total CHOL/HDL Ratio 7.3 (H) <5.0 Ratio   VLDL 73 (H) <30 mg/dL   LDL Cholesterol 147116 (H) <100 mg/dL  TSH  Result Value Ref Range   TSH 0.92 0.40 - 4.50 mIU/L      Assessment & Plan:   Problem List Items Addressed This Visit      Cardiovascular and Mediastinum   Hypertension    Avoid salt; continue CCB and ARB; encouraged ongoing limitation of salt; he has to avoid NSAIDs because of renal function        Endocrine   IFG (impaired fasting glucose)    Last A1c in August was normal; no need to recheck now; weight loss encouraged to prevent diabetes down the road        Musculoskeletal and Integument   Trigger ring finger of right hand    Really improved      Gouty arthritis    Check uric acid, last was over 6 here, and kidney doctor checked in March      Relevant Orders   Ambulatory referral to Pain Clinic   Uric acid     Genitourinary   Medullary cystic disease of the kidney    Congenital; follows with kidney specialist; unable to use any oral NSAIDs; will ask nephrologist if Ramos topical NSAID is something that could be tried      Relevant Orders   Ambulatory referral to Pain Clinic     Other   Opioid use    Order UDS      Relevant Orders   Urine drugs of abuse scrn w alc, routine (Ref Lab)   Medication monitoring encounter    Check liver and kidneys      Relevant Orders   Comprehensive metabolic panel   Urine drugs of abuse scrn w alc, routine (Ref Lab)   Hypertriglyceridemia    Return fasting for lipids, goal TG less than 200; will see what the labs show and either  start krill or fish oil or other agent      Relevant Orders   Lipid panel   Chronic pain syndrome - Primary    Refer to pain clinic per our new clinic guidelines, will request evaluation and ongoing management with [facility]; I will continue refills for up to three months      Relevant Orders   Ambulatory referral to Pain Clinic   Urine drugs of abuse scrn w alc, routine (Ref Lab)   Obesity    We talked about strategies to lose weight, importance of losing weight to help lower his TG, as well as prevent progression of his prediabetes into full type 2 diabetes mellitus down the road      Gout due to renal impairment    Patient will continue to limit trigger foods; continue Uloric; uric acid periodically      Controlled substance agreement signed    Consistent UDS in July 2017, Nov 2017, Feb 2018, May 2018, Aug 2018; he had one unexpected missed appointment, but he did try to reach our office and we listened to the recording together today which he reached (and still reaches) when trying to schedule an appt here; he is here now  for f/u appointment; he has always been compliant otherwise, brought his pill bottles, so I think this is Ramos tolerable variance to our usual policy; our clinic is moving away from providing chronic pain resources, so I explained that I will be referring him to Ramos pain specialist; as our clinic changes our procedures somewhat, I will see him monthly now and will provide prescriptions at scheduled appointments; he is in agreement and will follow-up when next prescriptions are needed in mid-December          Follow up plan: Return in about 2 weeks (around 10/26/2017) for follow-up visit with Dr. Sherie Don.  An after-visit summary was printed and given to the patient at check-out.  Please see the patient instructions which may contain other information and recommendations beyond what is mentioned above in the assessment and plan.  No orders of the defined types  were placed in this encounter.   Orders Placed This Encounter  Procedures  . Uric acid  . Comprehensive metabolic panel  . Lipid panel  . Urine drugs of abuse scrn w alc, routine (Ref Lab)  . Ambulatory referral to Pain Clinic    CC: Dr. Mosetta Pigeon, [facility]

## 2017-10-11 NOTE — Assessment & Plan Note (Signed)
Check liver and kidneys 

## 2017-10-11 NOTE — Patient Instructions (Addendum)
Check out the information at familydoctor.org entitled "Nutrition for Weight Loss: What You Need to Know about Fad Diets" Try to lose between 1-2 pounds per week by taking in fewer calories and burning off more calories You can succeed by limiting portions, limiting foods dense in calories and fat, becoming more active, and drinking 8 glasses of water a day (64 ounces) Don't skip meals, especially breakfast, as skipping meals may alter your metabolism Do not use over-the-counter weight loss pills or gimmicks that claim rapid weight loss A healthy BMI (or body mass index) is between 18.5 and 24.9 You can calculate your ideal BMI at the NIH website [employer].huhttp://www.nhlbi.nih.gov/health/educational/lose_wt/BMI/bmicalc.htm Please go to [facility] for fasting labs and urine drug testing in the next 7 days Return to see me on

## 2017-10-11 NOTE — Assessment & Plan Note (Signed)
Refer to pain clinic per our new clinic guidelines, will request evaluation and ongoing management with [facility]; I will continue refills for up to three months

## 2017-10-11 NOTE — Assessment & Plan Note (Signed)
Order: UDS

## 2017-10-11 NOTE — Assessment & Plan Note (Addendum)
Avoid salt; continue CCB and ARB; encouraged ongoing limitation of salt; he has to avoid NSAIDs because of renal function

## 2017-10-11 NOTE — Assessment & Plan Note (Addendum)
Last A1c in August was normal; no need to recheck now; weight loss encouraged to prevent diabetes down the road

## 2017-10-11 NOTE — Assessment & Plan Note (Signed)
Congenital; follows with kidney specialist; unable to use any oral NSAIDs; will ask nephrologist if a topical NSAID is something that could be tried

## 2017-10-11 NOTE — Assessment & Plan Note (Signed)
Really improved

## 2017-10-16 ENCOUNTER — Other Ambulatory Visit: Payer: Self-pay | Admitting: Family Medicine

## 2017-10-16 DIAGNOSIS — Z5181 Encounter for therapeutic drug level monitoring: Secondary | ICD-10-CM | POA: Diagnosis not present

## 2017-10-16 DIAGNOSIS — E781 Pure hyperglyceridemia: Secondary | ICD-10-CM | POA: Diagnosis not present

## 2017-10-16 DIAGNOSIS — M109 Gout, unspecified: Secondary | ICD-10-CM | POA: Diagnosis not present

## 2017-10-16 NOTE — Assessment & Plan Note (Signed)
We talked about strategies to lose weight, importance of losing weight to help lower his TG, as well as prevent progression of his prediabetes into full type 2 diabetes mellitus down the road

## 2017-10-16 NOTE — Assessment & Plan Note (Signed)
Patient will continue to limit trigger foods; continue Uloric; uric acid periodically

## 2017-10-16 NOTE — Assessment & Plan Note (Signed)
Consistent UDS in July 2017, Nov 2017, Feb 2018, May 2018, Aug 2018; he had one unexpected missed appointment, but he did try to reach our office and we listened to the recording together today which he reached (and still reaches) when trying to schedule an appt here; he is here now for f/u appointment; he has always been compliant otherwise, brought his pill bottles, so I think this is a tolerable variance to our usual policy; our clinic is moving away from providing chronic pain resources, so I explained that I will be referring him to a pain specialist; as our clinic changes our procedures somewhat, I will see him monthly now and will provide prescriptions at scheduled appointments; he is in agreement and will follow-up when next prescriptions are needed in mid-December

## 2017-10-20 LAB — COMPREHENSIVE METABOLIC PANEL
ALT: 16 IU/L (ref 0–44)
AST: 19 IU/L (ref 0–40)
Albumin/Globulin Ratio: 1.6 (ref 1.2–2.2)
Albumin: 4.6 g/dL (ref 3.5–5.5)
Alkaline Phosphatase: 95 IU/L (ref 39–117)
BUN/Creatinine Ratio: 23 — ABNORMAL HIGH (ref 9–20)
BUN: 50 mg/dL — ABNORMAL HIGH (ref 6–24)
Bilirubin Total: 0.3 mg/dL (ref 0.0–1.2)
CO2: 23 mmol/L (ref 20–29)
Calcium: 9.4 mg/dL (ref 8.7–10.2)
Chloride: 101 mmol/L (ref 96–106)
Creatinine, Ser: 2.22 mg/dL — ABNORMAL HIGH (ref 0.76–1.27)
GFR calc Af Amer: 40 mL/min/{1.73_m2} — ABNORMAL LOW (ref >59)
GFR calc non Af Amer: 35 mL/min/{1.73_m2} — ABNORMAL LOW (ref >59)
Globulin, Total: 2.8 g/dL (ref 1.5–4.5)
Glucose: 90 mg/dL (ref 65–99)
Potassium: 5.2 mmol/L (ref 3.5–5.2)
Sodium: 136 mmol/L (ref 134–144)
Total Protein: 7.4 g/dL (ref 6.0–8.5)

## 2017-10-20 LAB — LIPID PANEL
Chol/HDL Ratio: 6 ratio — ABNORMAL HIGH (ref 0.0–5.0)
Cholesterol, Total: 139 mg/dL (ref 100–199)
HDL: 23 mg/dL — ABNORMAL LOW (ref >39)
LDL Calculated: 40 mg/dL (ref 0–99)
Triglycerides: 382 mg/dL — ABNORMAL HIGH (ref 0–149)
VLDL Cholesterol Cal: 76 mg/dL — ABNORMAL HIGH (ref 5–40)

## 2017-10-20 LAB — URINE DRUGS OF ABUSE SCREEN W ALC, ROUTINE (REF LAB)
Amphetamines, Urine: NEGATIVE ng/mL
Barbiturate Quant, Ur: NEGATIVE ng/mL
Benzodiazepine Quant, Ur: NEGATIVE ng/mL
Cannabinoid Quant, Ur: NEGATIVE ng/mL
Cocaine (Metab.): NEGATIVE ng/mL
Ethanol, Urine: NEGATIVE %
Methadone Screen, Urine: NEGATIVE ng/mL
PCP Quant, Ur: NEGATIVE ng/mL
Propoxyphene: NEGATIVE ng/mL

## 2017-10-20 LAB — OPIATES CONFIRMATION, URINE
CODEINE: NEGATIVE
MORPHINE: POSITIVE — AB
Morphine GC/MS Conf: >3000 ng/mL
OPIATES: POSITIVE — AB

## 2017-10-20 LAB — URIC ACID: Uric Acid: 6.2 mg/dL (ref 3.7–8.6)

## 2017-10-22 ENCOUNTER — Other Ambulatory Visit: Payer: Self-pay | Admitting: Family Medicine

## 2017-10-22 NOTE — Telephone Encounter (Signed)
Last SGPT reviewed; last LDL at goal; Rx approved

## 2017-10-24 ENCOUNTER — Telehealth: Payer: Self-pay | Admitting: Family Medicine

## 2017-10-24 NOTE — Telephone Encounter (Signed)
Copied from CRM #[id number]. Topic: Quick Communication - Rx Refill/Question >> Oct 24, 2017 11:47 AM Rudi CocoLathan, Brenn Gatton M, NT wrote: Has the patient contacted their pharmacy? yes   (Agent: If no, request that the patient contact the pharmacy for the refill.)   Preferred Pharmacy (with phone number or street name): [phone] [place] - [address] [facility] [address] [place] [id number] Phone: [phone] Fax: [phone] Not a 24 hour pharmacy; exact hours not known     Agent: Please be advised that RX refills may take up to 3 business days. We ask that you follow-up with your pharmacy.

## 2017-10-24 NOTE — Telephone Encounter (Signed)
Copied from CRM #[id number]. Topic: Quick Communication - Rx Refill/Question >> Oct 24, 2017 11:47 AM Rudi CocoLathan, Latoya M, NT wrote: Has the patient contacted their pharmacy? yes   (Agent: If no, request that the patient contact the pharmacy for the refill.)   Preferred Pharmacy (with phone number or street name): [phone] - [place] - [address] [facility] [address] [place] [id number] Phone: [phone] Fax: [phone] Not a 24 hour pharmacy; exact hours not known     Agent: Please be advised that RX refills may take up to 3 business days. We ask that you follow-up with your pharmacy. >> Oct 24, 2017  6:33 PM Raquel SarnaHayes, Teresa G wrote: Pt called back to check on status of Rx.

## 2017-10-25 NOTE — Telephone Encounter (Signed)
Left message to call back with information on which medication needed to be refilled.

## 2017-10-26 ENCOUNTER — Ambulatory Visit (INDEPENDENT_AMBULATORY_CARE_PROVIDER_SITE_OTHER): Payer: Medicare Other | Admitting: Family Medicine

## 2017-10-26 ENCOUNTER — Encounter: Payer: Self-pay | Admitting: Family Medicine

## 2017-10-26 DIAGNOSIS — N183 Chronic kidney disease, stage 3 unspecified: Secondary | ICD-10-CM

## 2017-10-26 DIAGNOSIS — Z79899 Other long term (current) drug therapy: Secondary | ICD-10-CM | POA: Diagnosis not present

## 2017-10-26 DIAGNOSIS — M109 Gout, unspecified: Secondary | ICD-10-CM

## 2017-10-26 DIAGNOSIS — Q615 Medullary cystic kidney: Secondary | ICD-10-CM | POA: Diagnosis not present

## 2017-10-26 DIAGNOSIS — G894 Chronic pain syndrome: Secondary | ICD-10-CM | POA: Diagnosis not present

## 2017-10-26 DIAGNOSIS — F1190 Opioid use, unspecified, uncomplicated: Secondary | ICD-10-CM | POA: Diagnosis not present

## 2017-10-26 DIAGNOSIS — M1A39X1 Chronic gout due to renal impairment, multiple sites, with tophus (tophi): Secondary | ICD-10-CM

## 2017-10-26 MED ORDER — OXYCODONE-ACETAMINOPHEN 10-325 MG PO TABS: 1.0000 | ORAL_TABLET | Freq: Four times a day (QID) | ORAL | 0 refills | Status: DC | PRN

## 2017-10-26 MED ORDER — MORPHINE SULFATE ER 15 MG PO TBCR: 15.0000 mg | EXTENDED_RELEASE_TABLET | Freq: Two times a day (BID) | ORAL | 0 refills | Status: DC

## 2017-10-26 MED ORDER — NALOXONE HCL 4 MG/0.1ML NA LIQD: NASAL | 1 refills | Status: DC

## 2017-10-26 MED ORDER — PREDNISONE 10 MG PO TABS: ORAL_TABLET | ORAL | 1 refills | Status: DC

## 2017-10-26 MED ORDER — MORPHINE SULFATE ER 30 MG PO TBCR: 30.0000 mg | EXTENDED_RELEASE_TABLET | Freq: Two times a day (BID) | ORAL | 0 refills | Status: DC

## 2017-10-26 MED ORDER — OXYCODONE-ACETAMINOPHEN 10-325 MG PO TABS: 1.0000 | ORAL_TABLET | Freq: Three times a day (TID) | ORAL | 0 refills | Status: DC | PRN

## 2017-10-26 NOTE — Assessment & Plan Note (Signed)
Keeping MME under 90; with adjustment in current prescriptions, will be able to decrease this further; Rx for naloxone given today, along with safety information; awaiting appointment with pain clinic, requesting that they will take over pain control for this patient as our entire clinic is moving away from chronic pain management

## 2017-10-26 NOTE — Assessment & Plan Note (Signed)
With current gout flare involving left wrist, right hand and right elbow; prednisone taper; pain medicine with breakthrough oxycodone

## 2017-10-26 NOTE — Progress Notes (Signed)
BP 118/62   Pulse 86   Temp 98.1 F (36.7 C) (Oral)   Resp 16   Wt 241 lb (109.3 kg)   SpO2 96%   BMI 36.64 kg/m    Subjective:    Patient ID: Nathan Ramos, male    DOB: 1971/04/18, 46 y.o.   MRN: [id number]  HPI: Nathan Ramos is a 46 y.o. male  Chief Complaint  Patient presents with  . Follow-up    Subjective:  Nathan Ramos is a 46 y.o. male who presents for evaluation of pain. Records reviewed, and patient examined. Nature of the pain: ache Location of the pain: multiple joints; legs hurt at night as well Intensity: with medicine on a good day, down to a 2 out of 10; on a bad day, can go up to a 6 or 7 out of 10 Exacerbating/relieving factors: pain medicine, breakthrough Adverse effects of medications: does not feel goofy or drunk; no constipation  He also has a gout flare and that is discussed separately He called here on December 12th twice He did not hear back from this office He asked his pharmacy if that could do an emergency refill of his prednisone This current gout flare did not follow any food discretion; followed after being out in the cold and damp Left wrist and right hand and right elbow No fevers Did not sleep at all last night because of pain  Pill count: 12 of the MS Contin, 6 of the oxycodone breakthrough He actually misses a morning dose once or twice a week; has 12 pills of the long-acting pain medicine left, indicating more missed doses are likely Obesity; family is trying to help; drinking more water; he has lost a little weight since visit We reviewed all of his labs done last visit, including low HDL, high TG; discussed diet CKD, overall stable with Cr of 2.22; he sees nephrologist periodically; cannot take any NSAIDs  Depression screen [facility] 2/9 10/11/2017 06/29/2017 03/30/2017 01/01/2017 09/19/2016  Decreased Interest 0 0 0 0 0  Down, Depressed, Hopeless 0 0 0 0 0  PHQ - 2 Score 0 0 0 0 0   Relevant past medical, surgical, family and  social history reviewed Past Medical History:  Diagnosis Date  . Anemia    of chronic renal disease  . Bone spur of other site    right shoulder, managed by ortho  . Chronic pain    Destry Lovena Le CFNP at [facility]  . CKD (chronic kidney disease)    stage III 03/2014 (Dr. Murlean Iba)  . Controlled substance agreement signed    signed 06/02/15  . Gout   . Gouty arthritis    knee, managed by Ortho  . Hyperlipidemia   . Hypertension   . IFG (impaired fasting glucose)   . Left knee DJD   . 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 Primary localized osteoarthritis of right knee   . Smoker    Past Surgical History:  Procedure Laterality Date  . CARPAL TUNNEL RELEASE Left   . CATARACT EXTRACTION W/ INTRAOCULAR LENS IMPLANT Left 2008  . CATARACT EXTRACTION W/ INTRAOCULAR LENS IMPLANT Right 2008  . EYE SURGERY    . HERNIA REPAIR    . JOINT REPLACEMENT Left 03/2014  . TOTAL KNEE ARTHROPLASTY Left 03/30/2014   Procedure: TOTAL KNEE ARTHROPLASTY;  Surgeon: Lorn Junes, MD;  Location: [place];  Service: Orthopedics;  Laterality: Left;  .  TOTAL KNEE ARTHROPLASTY Right 10/26/2014   Procedure: TOTAL KNEE ARTHROPLASTY;  Surgeon: Lorn Junes, MD;  Location: [facility];  Service: Orthopedics;  Laterality: Right;  . UMBILICAL HERNIA REPAIR  2010   Family History  Problem Relation Age of Onset  . COPD Mother   . Kidney disease Mother   . Thyroid disease Mother   . Stroke Father 75  . COPD Maternal Grandmother    Social History   Tobacco Use  . Smoking status: Current Every Day Smoker    Packs/day: 0.50    Years: 22.00    Pack years: 11.00    Types: Cigarettes  . Smokeless tobacco: Never Used  Substance Use Topics  . Alcohol use: No  . Drug use: No    Interim medical history since last visit reviewed. Allergies and medications reviewed  Review of Systems Per HPI unless specifically indicated above     Objective:    BP  118/62   Pulse 86   Temp 98.1 F (36.7 C) (Oral)   Resp 16   Wt 241 lb (109.3 kg)   SpO2 96%   BMI 36.64 kg/m   Wt Readings from Last 3 Encounters:  10/26/17 241 lb (109.3 kg)  10/11/17 241 lb 14.4 oz (109.7 kg)  06/29/17 241 lb 3.2 oz (109.4 kg)    Physical Exam  Constitutional: He appears well-developed and well-nourished.  Obese, weight stable since last visit  HENT:  Head: Normocephalic and atraumatic.  Right Ear: Hearing normal.  Left Ear: Hearing normal.  Nose: No rhinorrhea (clear).  Mouth/Throat: Oropharynx is clear and moist and mucous membranes are normal.  Eyes: No scleral icterus.  Neck: No thyromegaly present.  Cardiovascular: Normal rate and regular rhythm.  Pulmonary/Chest: Effort normal and breath sounds normal. He has no wheezes.  Abdominal: He exhibits no distension.  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 He exhibits normal range of motion.       Left wrist: He exhibits decreased range of motion and swelling.       Right hand: He exhibits decreased range of motion, tenderness and swelling.       Left hand: He exhibits no bony tenderness, no deformity and no swelling.  Skin: No pallor.  Multiple tattoos  Psychiatric: He has a normal mood and affect. His behavior is normal. His mood appears not anxious. He does not exhibit a depressed mood.  Good eye contact with examiner   Results for orders placed or performed in visit on 10/16/17  Urine drugs of abuse scrn w alc, routine (Ref Lab)  Result Value Ref Range   Amphetamines, Urine Negative Cutoff=1000 ng/mL   Barbiturate Quant, Ur Negative Cutoff=300 ng/mL   Benzodiazepine Quant, Ur Negative Cutoff=300 ng/mL   Cannabinoid Quant, Ur Negative Cutoff=50 ng/mL   Cocaine (Metab.) Negative Cutoff=300 ng/mL   Opiate Quant, Ur See Final Results Cutoff=300 ng/mL   PCP Quant, Ur Negative Cutoff=25 ng/mL   Methadone Screen, Urine Negative Cutoff=300 ng/mL   Propoxyphene Negative  Cutoff=300 ng/mL   Ethanol, Urine Negative Cutoff=0.020 %  Comprehensive metabolic panel  Result Value Ref Range   Glucose 90 65 - 99 mg/dL   BUN 50 (H) 6 - 24 mg/dL   Creatinine, Ser 2.22 (H) 0.76 - 1.27 mg/dL   GFR calc non Af Amer 35 (L) >59 mL/min/1.73   GFR calc Af Amer 40 (L) >59 mL/min/1.73   BUN/Creatinine Ratio 23 (H) 9 - 20   Sodium 136  134 - 144 mmol/L   Potassium 5.2 3.5 - 5.2 mmol/L   Chloride 101 96 - 106 mmol/L   CO2 23 20 - 29 mmol/L   Calcium 9.4 8.7 - 10.2 mg/dL   Total Protein 7.4 6.0 - 8.5 g/dL   Albumin 4.6 3.5 - 5.5 g/dL   Globulin, Total 2.8 1.5 - 4.5 g/dL   Albumin/Globulin Ratio 1.6 1.2 - 2.2   Bilirubin Total 0.3 0.0 - 1.2 mg/dL   Alkaline Phosphatase 95 39 - 117 IU/L   AST 19 0 - 40 IU/L   ALT 16 0 - 44 IU/L  Lipid panel  Result Value Ref Range   Cholesterol, Total 139 100 - 199 mg/dL   Triglycerides 382 (H) 0 - 149 mg/dL   HDL 23 (L) >39 mg/dL   VLDL Cholesterol Cal 76 (H) 5 - 40 mg/dL   LDL Calculated 40 0 - 99 mg/dL   Chol/HDL Ratio 6.0 (H) 0.0 - 5.0 ratio  Uric acid  Result Value Ref Range   Uric Acid 6.2 3.7 - 8.6 mg/dL  Opiates Confirmation, Urine  Result Value Ref Range   OPIATES Positive (A) Cutoff=300   CODEINE Negative Cutoff=200   MORPHINE Positive (A)    Morphine GC/MS Conf >3000 Cutoff=200 ng/mL      Assessment & Plan:   Problem List Items Addressed This Visit      Musculoskeletal and Integument   Gouty arthritis    With current gout flare involving left wrist, right hand and right elbow; prednisone taper; pain medicine with breakthrough oxycodone      Relevant Medications   predniSONE (DELTASONE) 10 MG tablet   morphine (MS CONTIN) 15 MG 12 hr tablet   oxyCODONE-acetaminophen (PERCOCET) 10-325 MG tablet     Genitourinary   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 Candiss Norse; AVOID NSAIDs      Chronic kidney disease, stage III (moderate) (HCC)    Creatinine stable at 2.22; GFR hovering in  the low 30s; AVOID NSAIDs; follow-up with nephrologist periodically        Other   Opioid use    Keeping MME under 90; with adjustment in current prescriptions, will be able to decrease this further; Rx for naloxone given today, along with safety information; awaiting appointment with pain clinic, requesting that they will take over pain control for this patient as our entire clinic is moving away from chronic pain management      Gout due to renal impairment    Secondary to renal disease; treat current flare with prednisone, NO NSAIDs; continue Uloric      Controlled substance agreement signed    UTD; urine drug screen consistent      Chronic pain syndrome    We discussed current medicine regimen; since he is not taking 30 mg BID regularly, suggested that we drop to 15 mg every 12 hours regularly, and that will allow me to prescribe a few more of the breakthrough oxycodone for gout flares, PRN use; I have him under 90 MME but cannot raise his PRN oxycodone without surpassing that; by decreasing his long-acting, I can drop his MME totally and give him more flexibility with flares; he is in agreement with this; we discussed safe disposal of his pills at the police station; I also gave him a hand-out on safety, and provided him with a naloxone prescription and explanation for use and prevention of accidental overdose      Relevant Medications  oxyCODONE-acetaminophen (PERCOCET) 10-325 MG tablet       Follow up plan: Return in about 4 weeks (around 11/23/2017) for follow-up visit with Dr. Sanda Klein.  An after-visit summary was printed and given to the patient at [place].  Please see the patient instructions which may contain other information and recommendations beyond what is mentioned above in the assessment and plan.  Meds ordered this encounter  Medications  . predniSONE (DELTASONE) 10 MG tablet    Sig: Take 5 pills by mouth today, then 4 on day 2, 3 on day 3, 2 on day 4, and 1 on day  5    Dispense:  15 tablet    Refill:  1  . naloxone (NARCAN) nasal spray 4 mg/0.1 mL    Sig: Use in case of accidental overdose with narcotics / opioids    Dispense:  1 kit    Refill:  1  . DISCONTD: oxyCODONE-acetaminophen (PERCOCET) 10-325 MG tablet    Sig: Take 1 tablet by mouth every 8 (eight) hours as needed for pain (max three pills per 24 hours).    Dispense:  65 tablet    Refill:  0    Fill at [facility]; chronic pain, contract; reviewed [employer] site x 12 months  . DISCONTD: morphine (MS CONTIN) 30 MG 12 hr tablet    Sig: Take 1 tablet (30 mg total) by mouth every 12 (twelve) hours.    Dispense:  60 tablet    Refill:  0    Fill at [facility]; chronic pain, contract; reviewed [employer] site x 12 months  . morphine (MS CONTIN) 15 MG 12 hr tablet    Sig: Take 1 tablet (15 mg total) by mouth every 12 (twelve) hours.    Dispense:  60 tablet    Refill:  0    New lower dose  . oxyCODONE-acetaminophen (PERCOCET) 10-325 MG tablet    Sig: Take 1 tablet by mouth every 6 (six) hours as needed for pain (max three pills per 24 hours).    Dispense:  90 tablet    Refill:  0    Fill at [facility]; chronic pain, contract; reviewed [place] web site x 12 months    No orders of the defined types were placed in this encounter.

## 2017-10-26 NOTE — Assessment & Plan Note (Signed)
We discussed current medicine regimen; since he is not taking 30 mg BID regularly, suggested that we drop to 15 mg every 12 hours regularly, and that will allow me to prescribe a few more of the breakthrough oxycodone for gout flares, PRN use; I have him under 90 MME but cannot raise his PRN oxycodone without surpassing that; by decreasing his long-acting, I can drop his MME totally and give him more flexibility with flares; he is in agreement with this; we discussed safe disposal of his pills at the police station; I also gave him a hand-out on safety, and provided him with a naloxone prescription and explanation for use and prevention of accidental overdose

## 2017-10-26 NOTE — Assessment & Plan Note (Signed)
Congenital; monitored by Dr. Thedore MinsSingh; AVOID NSAIDs

## 2017-10-26 NOTE — Assessment & Plan Note (Signed)
UTD; urine drug screen consistent

## 2017-10-26 NOTE — Patient Instructions (Addendum)
Try tai chi or yoga gently to help with mobility and stretching; start slow and build up gradually You can check out YouTube for some gentle exercises Let's decrease your MS Contin (morphine sulfate) to 15 mg every twelve hours You can use up to three breakthrough oxycodone pills per day if needed Call with any problems Please safely turn in the extra 30 mg pills that you have at the police department or other safe location where the pills will be destroyed  Naloxone nasal spray What is this medicine? NALOXONE (nal OX one) is a narcotic blocker. It is used to treat narcotic drug overdose. This medicine may be used for other purposes; ask your health care provider or pharmacist if you have questions. COMMON BRAND NAME(S): Narcan What should I tell my health care provider before I take this medicine? They need to know if you have any of these conditions: -drug abuse or addiction -heart disease -an unusual or allergic reaction to naloxone, other medicines, foods, dyes, or preservatives -pregnant or trying to get pregnant -breast-feeding How should I use this medicine? This medicine is for use in the nose. Lay the person on their back. Support their neck with your hand and allow the head to tilt back before giving the medicine. The nasal spray should be given into 1 nostril. After giving the medicine, move the person onto their side. Do not remove or test the nasal spray until ready to use. Get emergency medical help right away after giving the first dose of this medicine, even if the person wakes up. You should be familiar with how to recognize the signs and symptoms of a narcotic overdose. If more doses are needed, give the additional dose in the other nostril. Talk to your pediatrician regarding the use of this medicine in children. While this drug may be prescribed for children as young as newborns for selected conditions, precautions do apply. Overdosage: If you think you have taken too much  of this medicine contact a poison control center or emergency room at once. NOTE: This medicine is only for you. Do not share this medicine with others. What if I miss a dose? This does not apply. What may interact with this medicine? This medicine is only used during an emergency. No interactions are expected during emergency use. This list may not describe all possible interactions. Give your health care provider a list of all the medicines, herbs, non-prescription drugs, or dietary supplements you use. Also tell them if you smoke, drink alcohol, or use illegal drugs. Some items may interact with your medicine. What should I watch for while using this medicine? Keep this medicine ready for use in the case of a narcotic overdose. Make sure that you have the phone number of your doctor or health care professional and local hospital ready. You may need to have additional doses of this medicine. Each nasal spray contains a single dose. Some emergencies may require additional doses. After use, bring the treated person to the nearest hospital or call 911. Make sure the treating health care professional knows that the person has received an injection of this medicine. You will receive additional instructions on what to do during and after use of this medicine before an emergency occurs. What side effects may I notice from receiving this medicine? Side effects that you should report to your doctor or health care professional as soon as possible: -allergic reactions like skin rash, itching or hives, swelling of the face, lips, or tongue -breathing problems -  fast, irregular heartbeat -high blood pressure -pain that was controlled by narcotic pain medicine -seizures Side effects that usually do not require medical attention (report to your doctor or health care professional if they continue or are bothersome): -anxious -chills -diarrhea -fever -headache -muscle pain -nausea, vomiting -nose  irritation like dryness, congestion, and swelling -sweating This list may not describe all possible side effects. Call your doctor for medical advice about side effects. You may report side effects to FDA at 1-800-FDA-1088. Where should I keep my medicine? Keep out of the reach of children. Store between 4 and 40 degrees C (39 and 104 degrees F). Do not freeze. Throw away any unused medicine after the expiration date. Keep in original box until ready to use. NOTE: This sheet is a summary. It may not cover all possible information. If you have questions about this medicine, talk to your doctor, pharmacist, or health care provider.  2018 Elsevier/Gold Standard (2015-12-03 13:55:10)

## 2017-10-26 NOTE — Assessment & Plan Note (Signed)
Creatinine stable at 2.22; GFR hovering in the low 30s; AVOID NSAIDs; follow-up with nephrologist periodically

## 2017-10-26 NOTE — Assessment & Plan Note (Signed)
Secondary to renal disease; treat current flare with prednisone, NO NSAIDs; continue Uloric

## 2017-10-31 NOTE — Telephone Encounter (Signed)
The patient was requesting prednisone He came in to see me on December 14th I'm closing this open phone note

## 2017-11-23 ENCOUNTER — Encounter: Payer: Self-pay | Admitting: Family Medicine

## 2017-11-23 ENCOUNTER — Ambulatory Visit (INDEPENDENT_AMBULATORY_CARE_PROVIDER_SITE_OTHER): Payer: Medicare Other | Admitting: Family Medicine

## 2017-11-23 VITALS — BP 138/78 | HR 85 | Temp 98.4°F | Resp 16 | Wt 233.0 lb

## 2017-11-23 DIAGNOSIS — N183 Chronic kidney disease, stage 3 unspecified: Secondary | ICD-10-CM

## 2017-11-23 DIAGNOSIS — Z6835 Body mass index (BMI) 35.0-35.9, adult: Secondary | ICD-10-CM | POA: Diagnosis not present

## 2017-11-23 DIAGNOSIS — F1190 Opioid use, unspecified, uncomplicated: Secondary | ICD-10-CM | POA: Diagnosis not present

## 2017-11-23 DIAGNOSIS — Z79899 Other long term (current) drug therapy: Secondary | ICD-10-CM | POA: Diagnosis not present

## 2017-11-23 DIAGNOSIS — E6609 Other obesity due to excess calories: Secondary | ICD-10-CM

## 2017-11-23 DIAGNOSIS — R69 Illness, unspecified: Secondary | ICD-10-CM | POA: Diagnosis not present

## 2017-11-23 DIAGNOSIS — G894 Chronic pain syndrome: Secondary | ICD-10-CM | POA: Diagnosis not present

## 2017-11-23 DIAGNOSIS — J111 Influenza due to unidentified influenza virus with other respiratory manifestations: Secondary | ICD-10-CM

## 2017-11-23 MED ORDER — OXYCODONE-ACETAMINOPHEN 10-325 MG PO TABS: 1.0000 | ORAL_TABLET | Freq: Four times a day (QID) | ORAL | 0 refills | Status: DC | PRN

## 2017-11-23 MED ORDER — MORPHINE SULFATE ER 15 MG PO TBCR: 15.0000 mg | EXTENDED_RELEASE_TABLET | Freq: Two times a day (BID) | ORAL | 0 refills | Status: DC

## 2017-11-23 NOTE — Patient Instructions (Addendum)
Try vitamin C (orange juice if not diabetic or vitamin C tablets) and drink green tea to help your immune system during your illness Get plenty of rest and hydration Do see the pain clinic and take all of your pill bottles with you Keep up the great job with your weight loss

## 2017-11-23 NOTE — Progress Notes (Signed)
BP 138/78   Pulse 85   Temp 98.4 F (36.9 C) (Oral)   Resp 16   Wt 233 lb (105.7 kg)   SpO2 98%   BMI 35.43 kg/m    Subjective:    Patient ID: Nathan Ramos, male    DOB: 08-07-1971, 47 y.o.   MRN: [id number]  HPI: Nathan Ramos is a 47 y.o. male  Chief Complaint  Patient presents with  . Follow-up  . URI    HPI He is sick, coughing, chilled, fever between 98-101 degrees He thinks he had the flu, getting over this He drank fluids; no rash Duration: two weeks Cough is phlegmy, green in color, bringing up a little at a time Everyone in the house sick He tried some Dayquil and tylenol He worked last weekend Not really short of breath Did not get flu shot this year  Chronic pain; cannot use NSAIDs because of kidneys Pain medicine does not make him goofy or loopy Any problems with constipation Pain level right now: 4 out of 10 Pain level at its best with medicine: 2 out of 10 Worst pain in the last two weeks: 6 to 8 out of 10 when sick, achy feeling and really bad cramps Appointment with pain clinic on January 22nd Pill count: dark blue round pills, 15 on one side, 7 pills Pill count oxy: white oblong pill, 11 pills  Depression screen [facility] 2/9 10/11/2017 06/29/2017 03/30/2017 01/01/2017 09/19/2016  Decreased Interest 0 0 0 0 0  Down, Depressed, Hopeless 0 0 0 0 0  PHQ - 2 Score 0 0 0 0 0    Relevant past medical, surgical, family and social history reviewed Past Medical History:  Diagnosis Date  . Anemia    of chronic renal disease  . Bone spur of other site    right shoulder, managed by ortho  . Chronic pain    [name] CFNP at [facility]  . CKD (chronic kidney disease)    stage III 03/2014 (Dr. Mosetta Pigeon)  . Controlled substance agreement signed    signed 06/02/15  . Gout   . Gouty arthritis    knee, managed by Ortho  . Hyperlipidemia   . Hypertension   . IFG (impaired fasting glucose)   . Left knee DJD   . 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 Primary localized osteoarthritis of right knee   . Smoker    Past Surgical History:  Procedure Laterality Date  . CARPAL TUNNEL RELEASE Left   . CATARACT EXTRACTION W/ INTRAOCULAR LENS IMPLANT Left 2008  . CATARACT EXTRACTION W/ INTRAOCULAR LENS IMPLANT Right 2008  . EYE SURGERY    . HERNIA REPAIR    . JOINT REPLACEMENT Left 03/2014  . TOTAL KNEE ARTHROPLASTY Left 03/30/2014   Procedure: TOTAL KNEE ARTHROPLASTY;  Surgeon: Nilda Simmer, MD;  Location: [facility] OR;  Service: Orthopedics;  Laterality: Left;  . TOTAL KNEE ARTHROPLASTY Right 10/26/2014   Procedure: TOTAL KNEE ARTHROPLASTY;  Surgeon: Nilda Simmer, MD;  Location: [facility] OR;  Service: Orthopedics;  Laterality: Right;  . UMBILICAL HERNIA REPAIR  2010   Family History  Problem Relation Age of Onset  . COPD Mother   . Kidney disease Mother   . Thyroid disease Mother   . Stroke Father 62  . COPD Maternal Grandmother    Social History   Tobacco Use  . Smoking status: Current Every Day Smoker  Packs/day: 0.50    Years: 22.00    Pack years: 11.00    Types: Cigarettes  . Smokeless tobacco: Never Used  Substance Use Topics  . Alcohol use: No  . Drug use: No    Interim medical history since last visit reviewed. Allergies and medications reviewed  Review of Systems Per HPI unless specifically indicated above     Objective:    BP 138/78   Pulse 85   Temp 98.4 F (36.9 C) (Oral)   Resp 16   Wt 233 lb (105.7 kg)   SpO2 98%   BMI 35.43 kg/m   Wt Readings from Last 3 Encounters:  11/23/17 233 lb (105.7 kg)  10/26/17 241 lb (109.3 kg)  10/11/17 241 lb 14.4 oz (109.7 kg)    Physical Exam  Constitutional: He appears well-developed and well-nourished.  Obese, weight stable since last visit  HENT:  Head: Normocephalic and atraumatic.  Right Ear: Hearing normal.  Left Ear: Hearing normal.  Nose: Rhinorrhea (clear) present.  Mouth/Throat: Oropharynx is clear  and moist and mucous membranes are normal. No posterior oropharyngeal edema or posterior oropharyngeal erythema.  Right ear tympanosclerosis  Eyes: No scleral icterus.  Neck: No thyromegaly present.  Cardiovascular: Normal rate and regular rhythm.  Pulmonary/Chest: Effort normal and breath sounds normal. He has no wheezes.  Abdominal: He exhibits no distension.  Lymphadenopathy:    He has no cervical adenopathy.  Skin: He is not diaphoretic. No pallor.  Multiple tattoos  Psychiatric: He has a normal mood and affect. His behavior is normal. His mood appears not anxious. He does not exhibit a depressed mood.  Good eye contact with examiner   Results for orders placed or performed in visit on 10/16/17  Urine drugs of abuse scrn w alc, routine (Ref Lab)  Result Value Ref Range   Amphetamines, Urine Negative Cutoff=1000 ng/mL   Barbiturate Quant, Ur Negative Cutoff=300 ng/mL   Benzodiazepine Quant, Ur Negative Cutoff=300 ng/mL   Cannabinoid Quant, Ur Negative Cutoff=50 ng/mL   Cocaine (Metab.) Negative Cutoff=300 ng/mL   Opiate Quant, Ur See Final Results Cutoff=300 ng/mL   PCP Quant, Ur Negative Cutoff=25 ng/mL   Methadone Screen, Urine Negative Cutoff=300 ng/mL   Propoxyphene Negative Cutoff=300 ng/mL   Ethanol, Urine Negative Cutoff=0.020 %  Comprehensive metabolic panel  Result Value Ref Range   Glucose 90 65 - 99 mg/dL   BUN 50 (H) 6 - 24 mg/dL   Creatinine, Ser 1.612.22 (H) 0.76 - 1.27 mg/dL   GFR calc non Af Amer 35 (L) >59 mL/min/1.73   GFR calc Af Amer 40 (L) >59 mL/min/1.73   BUN/Creatinine Ratio 23 (H) 9 - 20   Sodium 136 134 - 144 mmol/L   Potassium 5.2 3.5 - 5.2 mmol/L   Chloride 101 96 - 106 mmol/L   CO2 23 20 - 29 mmol/L   Calcium 9.4 8.7 - 10.2 mg/dL   Total Protein 7.4 6.0 - 8.5 g/dL   Albumin 4.6 3.5 - 5.5 g/dL   Globulin, Total 2.8 1.5 - 4.5 g/dL   Albumin/Globulin Ratio 1.6 1.2 - 2.2   Bilirubin Total 0.3 0.0 - 1.2 mg/dL   Alkaline Phosphatase 95 39 - 117 IU/L     AST 19 0 - 40 IU/L   ALT 16 0 - 44 IU/L  Lipid panel  Result Value Ref Range   Cholesterol, Total 139 100 - 199 mg/dL   Triglycerides 096382 (H) 0 - 149 mg/dL   HDL 23 (L) >04>39 mg/dL  VLDL Cholesterol Cal 76 (H) 5 - 40 mg/dL   LDL Calculated 40 0 - 99 mg/dL   Chol/HDL Ratio 6.0 (H) 0.0 - 5.0 ratio  Uric acid  Result Value Ref Range   Uric Acid 6.2 3.7 - 8.6 mg/dL  Opiates Confirmation, Urine  Result Value Ref Range   OPIATES Positive (A) Cutoff=300   CODEINE Negative Cutoff=200   MORPHINE Positive (A)    Morphine GC/MS Conf >3000 Cutoff=200 ng/mL      Assessment & Plan:   Problem List Items Addressed This Visit      Genitourinary   Chronic kidney disease, stage III (moderate) (HCC)    Cannot use NSAIDs for pain due to chronic kidney disease        Other   Chronic pain syndrome   Relevant Medications   oxyCODONE-acetaminophen (PERCOCET) 10-325 MG tablet   Opioid use    Tolerating lower dose of daily opioids; he will be seeing pain clinic soon to be evaluated, hoping to turn prescriptions over to them permanently as our clinic transitions to less pain maintenance; no red flags or concerns for misuse or diversion      Obesity    Praise given for patient's efforts at weight loss, especially over the Christmas holidays; encouraged him to continue, as his obesity likely affects his triglycerides, blood pressure, etc.      Controlled substance agreement signed    UTD agreement and consistent urine drug screen (Dec 2018); no concerns for misuse or diversion; will continue to prescribe pain medicine until he is evaluated by pain clinic and they decide about taking over his prescriptions as we transition to utilizing pain clinics for narcotic prescriptions       Other Visit Diagnoses    Influenza-like illness    -  Primary   patient sounds to be improving, on the back end of this illness; contagious; rest, hydration       Follow up plan: Return in about 4 weeks (around  12/24/2017) for follow-up visit with Dr. Sherie Don.  An after-visit summary was printed and given to the patient at check-out.  Please see the patient instructions which may contain other information and recommendations beyond what is mentioned above in the assessment and plan.  Meds ordered this encounter  Medications  . morphine (MS CONTIN) 15 MG 12 hr tablet    Sig: Take 1 tablet (15 mg total) by mouth every 12 (twelve) hours.    Dispense:  60 tablet    Refill:  0  . oxyCODONE-acetaminophen (PERCOCET) 10-325 MG tablet    Sig: Take 1 tablet by mouth every 6 (six) hours as needed for pain (max three pills per 24 hours).    Dispense:  90 tablet    Refill:  0    Fill at [facility] [employer]; chronic pain, substance agreement; seeing pain clinic soon    No orders of the defined types were placed in this encounter.

## 2017-11-28 NOTE — Assessment & Plan Note (Signed)
Tolerating lower dose of daily opioids; he will be seeing pain clinic soon to be evaluated, hoping to turn prescriptions over to them permanently as our clinic transitions to less pain maintenance; no red flags or concerns for misuse or diversion

## 2017-11-28 NOTE — Assessment & Plan Note (Signed)
UTD agreement and consistent urine drug screen (Dec 2018); no concerns for misuse or diversion; will continue to prescribe pain medicine until he is evaluated by pain clinic and they decide about taking over his prescriptions as we transition to utilizing pain clinics for narcotic prescriptions

## 2017-11-28 NOTE — Assessment & Plan Note (Signed)
Praise given for patient's efforts at weight loss, especially over the Christmas holidays; encouraged him to continue, as his obesity likely affects his triglycerides, blood pressure, etc.

## 2017-11-28 NOTE — Assessment & Plan Note (Signed)
Cannot use NSAIDs for pain due to chronic kidney disease

## 2017-12-04 ENCOUNTER — Ambulatory Visit: Payer: Medicare Other | Admitting: Student in an Organized Health Care Education/Training Program

## 2017-12-06 ENCOUNTER — Encounter: Payer: Self-pay | Admitting: Student in an Organized Health Care Education/Training Program

## 2017-12-06 ENCOUNTER — Ambulatory Visit
Payer: Medicare Other | Attending: Student in an Organized Health Care Education/Training Program | Admitting: Student in an Organized Health Care Education/Training Program

## 2017-12-06 VITALS — BP 171/98 | HR 100 | Temp 98.4°F | Resp 16 | Ht 69.0 in | Wt 233.0 lb

## 2017-12-06 DIAGNOSIS — M25572 Pain in left ankle and joints of left foot: Secondary | ICD-10-CM | POA: Diagnosis not present

## 2017-12-06 DIAGNOSIS — M1A9XX0 Chronic gout, unspecified, without tophus (tophi): Secondary | ICD-10-CM | POA: Diagnosis present

## 2017-12-06 DIAGNOSIS — Q615 Medullary cystic kidney: Secondary | ICD-10-CM | POA: Diagnosis not present

## 2017-12-06 DIAGNOSIS — N183 Chronic kidney disease, stage 3 unspecified: Secondary | ICD-10-CM

## 2017-12-06 DIAGNOSIS — M1A39X1 Chronic gout due to renal impairment, multiple sites, with tophus (tophi): Secondary | ICD-10-CM | POA: Diagnosis not present

## 2017-12-06 DIAGNOSIS — E781 Pure hyperglyceridemia: Secondary | ICD-10-CM | POA: Insufficient documentation

## 2017-12-06 DIAGNOSIS — I129 Hypertensive chronic kidney disease with stage 1 through stage 4 chronic kidney disease, or unspecified chronic kidney disease: Secondary | ICD-10-CM | POA: Diagnosis not present

## 2017-12-06 DIAGNOSIS — Z886 Allergy status to analgesic agent status: Secondary | ICD-10-CM | POA: Diagnosis not present

## 2017-12-06 DIAGNOSIS — M25531 Pain in right wrist: Secondary | ICD-10-CM | POA: Diagnosis not present

## 2017-12-06 DIAGNOSIS — Z96653 Presence of artificial knee joint, bilateral: Secondary | ICD-10-CM | POA: Diagnosis not present

## 2017-12-06 DIAGNOSIS — Z79899 Other long term (current) drug therapy: Secondary | ICD-10-CM | POA: Diagnosis not present

## 2017-12-06 DIAGNOSIS — M25562 Pain in left knee: Secondary | ICD-10-CM | POA: Diagnosis not present

## 2017-12-06 DIAGNOSIS — G894 Chronic pain syndrome: Secondary | ICD-10-CM | POA: Diagnosis not present

## 2017-12-06 DIAGNOSIS — M170 Bilateral primary osteoarthritis of knee: Secondary | ICD-10-CM | POA: Diagnosis not present

## 2017-12-06 DIAGNOSIS — E669 Obesity, unspecified: Secondary | ICD-10-CM | POA: Diagnosis not present

## 2017-12-06 DIAGNOSIS — R7301 Impaired fasting glucose: Secondary | ICD-10-CM | POA: Diagnosis not present

## 2017-12-06 DIAGNOSIS — M25571 Pain in right ankle and joints of right foot: Secondary | ICD-10-CM | POA: Insufficient documentation

## 2017-12-06 DIAGNOSIS — F1190 Opioid use, unspecified, uncomplicated: Secondary | ICD-10-CM | POA: Diagnosis not present

## 2017-12-06 DIAGNOSIS — M65341 Trigger finger, right ring finger: Secondary | ICD-10-CM | POA: Diagnosis not present

## 2017-12-06 DIAGNOSIS — M1030 Gout due to renal impairment, unspecified site: Secondary | ICD-10-CM | POA: Insufficient documentation

## 2017-12-06 DIAGNOSIS — M25511 Pain in right shoulder: Secondary | ICD-10-CM | POA: Diagnosis present

## 2017-12-06 DIAGNOSIS — D649 Anemia, unspecified: Secondary | ICD-10-CM | POA: Insufficient documentation

## 2017-12-06 DIAGNOSIS — Z720 Tobacco use: Secondary | ICD-10-CM | POA: Diagnosis not present

## 2017-12-06 DIAGNOSIS — E559 Vitamin D deficiency, unspecified: Secondary | ICD-10-CM | POA: Diagnosis not present

## 2017-12-06 DIAGNOSIS — M25561 Pain in right knee: Secondary | ICD-10-CM | POA: Diagnosis not present

## 2017-12-06 DIAGNOSIS — M779 Enthesopathy, unspecified: Secondary | ICD-10-CM | POA: Insufficient documentation

## 2017-12-06 DIAGNOSIS — F17210 Nicotine dependence, cigarettes, uncomplicated: Secondary | ICD-10-CM | POA: Insufficient documentation

## 2017-12-06 NOTE — Progress Notes (Signed)
Nursing Pain Medication Assessment:  Safety precautions to be maintained throughout the outpatient stay will include: orient to surroundings, keep bed in low position, maintain call bell within reach at all times, provide assistance with transfer out of bed and ambulation.  Medication Inspection Compliance: Pill count conducted under aseptic conditions, in front of the patient. Neither the pills nor the bottle was removed from the patient's sight at any time. Once count was completed pills were immediately returned to the patient in their original bottle.  Medication #1: Oxycodone/APAP Pill/Patch Count: 68 of 90 pills remain Pill/Patch Appearance: Markings consistent with prescribed medication Bottle Appearance: Standard pharmacy container. Clearly labeled. Filled Date: 01 / 14 / 2019 Last Medication intake:  Today  Medication #2: Morphine ER (MSContin) Pill/Patch Count: 44 of 60 pills remain Pill/Patch Appearance: Markings consistent with prescribed medication Bottle Appearance: Standard pharmacy container. Clearly labeled. Filled Date: 01 / 14 / 2019 Last Medication intake:  Today

## 2017-12-06 NOTE — Progress Notes (Signed)
Patient's Name: Nathan Ramos  MRN: [id number]  Referring Provider: Arnetha Courser, MD  DOB: 10/22/1971  PCP: Arnetha Courser, MD  DOS: 12/06/2017  Note by: Gillis Santa, MD  Service setting: Ambulatory outpatient  Specialty: Interventional Pain Management  Location: [facility] (AMB) Pain Management Facility  Visit type: Initial Patient Evaluation  Patient type: New Patient   Primary Reason(s) for Visit: Encounter for initial evaluation of one or more chronic problems (new to examiner) potentially causing chronic pain, and posing a threat to normal musculoskeletal function. (Level of risk: High) CC: Joint Pain (chronic gout); Shoulder Pain (right rotator cuff injury); Knee Pain (s/p bilateral joint replacement 2014 6 months apart); and Ankle Pain (bilateral r/t gout, left is worse currently )  HPI  [name] is a 47 y.o. year old, male patient, who comes today to see [place] for the first time for an initial evaluation of his chronic pain. He has Medullary cystic disease of the kidney; Left knee DJD; Chronic pain; Hypertension; Gout due to renal impairment; DJD (degenerative joint disease) of knee; Primary localized osteoarthritis of right knee; Chronic kidney disease, stage III (moderate) ([place]); Anemia; Hypertriglyceridemia; IFG (impaired fasting glucose); Gouty arthritis; Controlled substance agreement signed; Wrist pain, right; Medication monitoring encounter; Trigger ring finger of right hand; Obesity; Tobacco abuse; Chronic pain syndrome; Vitamin D deficiency; and Opioid use on their problem list. Today he comes in for evaluation of his Joint Pain (chronic gout); Shoulder Pain (right rotator cuff injury); Knee Pain (s/p bilateral joint replacement 2014 6 months apart); and Ankle Pain (bilateral r/t gout, left is worse currently )  Pain Assessment: Location: Other (Comment) (see visit info for pain sites. ) Radiating: shoulder pain creeps up into neck.  ankle pain creates pain up into legs and knee pain  creates pain up into upper legs.  Onset: More than a month ago Duration: Chronic pain Quality: Discomfort, Sharp, Stabbing, Aching, Constant(varying in inensity) Severity: 4 /10 (self-reported pain score)  Note: Reported level is inconsistent with clinical observations. Clinically the patient looks like a 2/10 A 2/10 is viewed as "Mild to Moderate" and described as noticeable and distracting. Impossible to hide from other people. More frequent flare-ups. Still possible to adapt and function close to normal. It can be very annoying and may have occasional stronger flare-ups. With discipline, patients may get used to it and adapt.       When using our objective Pain Scale, levels between 6 and 10/10 are said to belong in an emergency room, as it progressively worsens from a 6/10, described as severely limiting, requiring emergency care not usually available at an outpatient pain management facility. At a 6/10 level, communication becomes difficult and requires great effort. Assistance to reach the emergency department may be required. Facial flushing and profuse sweating along with potentially dangerous increases in heart rate and blood pressure will be evident. Effect on ADL: too much activity on one day will cause him extra pain on the subsequent day Timing: Constant Modifying factors: rest  Onset and Duration: Present longer than 3 months Cause of pain: severe gout flare  Severity: No change since onset Timing: Not influenced by the time of the day and After activity or exercise Aggravating Factors: Climbing, Prolonged sitting and Prolonged standing Alleviating Factors: Lying down, Medications, Resting and Sleeping Associated Problems: Fatigue, Numbness, Spasms, Swelling, Pain that wakes patient up and Pain that does not allow patient to sleep Quality of Pain: Aching, Dreadful, Exhausting, Sharp, Shooting and Tender Previous Examinations  or Tests: Biopsy, MRI scan, X-rays, Nerve conduction test  and Orthopedic evaluation Previous Treatments: Epidural steroid injections, Narcotic medications, Physical Therapy and Steroid treatments by mouth  The patient comes into the clinics today for the first time for a chronic pain management evaluation.  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  Patient describes his pain as diffuse in his knees and ankles.  He attributes this to gout.  He is status post bilateral knee replacement surgery.  According to chart review, patient has always been compliant with his urine drug screens in his appointments.  PMP reviewed reveals chronic opioid therapy for the last 10 years.  Patient denies any drug abuse, substance abuse, diversion.  He endorses appropriate use of medications.  Patient has 6 children.  He does work part-time.  Today I took the time to provide the patient with information regarding my pain practice. The patient was informed that my practice is divided into two sections: an interventional pain management section, as well as a completely separate and distinct medication management section. I explained that I have procedure days for my interventional therapies, and evaluation days for follow-ups and medication management. Because of the amount of documentation required during both, they are kept separated. This means that there is the possibility that he may be scheduled for a procedure on one day, and medication management the next. I have also informed him that because of staffing and facility limitations, I no longer take patients for medication management only. To illustrate the reasons for this, I gave the patient the example of surgeons, and how  inappropriate it would be to refer a patient to his/her care, just to write for the post-surgical antibiotics on a surgery done by a different surgeon.   Because interventional pain management is my board-certified specialty, the patient was informed that joining my practice means that they are open to any and all interventional therapies. I made it clear that this does not mean that they will be forced to have any procedures done. What this means is that I believe interventional therapies to be essential part of the diagnosis and proper management of chronic pain conditions. Therefore, patients not interested in these interventional alternatives will be better served under the care of a different practitioner.  The patient was also made aware of my Comprehensive Pain Management Safety Guidelines where by joining my practice, they limit all of their nerve blocks and joint injections to those done by our practice, for as long as we are retained to manage their care.   Historic Controlled Substance Pharmacotherapy Review  PMP and historical list of controlled substances: Morphine 15 mg BID quantity 11-month oxycodone 10 mg 3 times daily as needed, quantity 960-monthlast fill 11/26/2017 MME/day: Oxycodone equals 45, morphine equals 30 mg/day Medications: Patient brought medications to be checked, as requested Pharmacodynamics: Desired effects: Analgesia: The patient reports >50% benefit. Reported improvement in function: The patient reports medication allows him to accomplish basic ADLs. Clinically meaningful improvement in function (CMIF): Sustained CMIF goals met Perceived effectiveness: Described as relatively effective, allowing for increase in activities of daily living (ADL) Undesirable effects: Side-effects or Adverse reactions: None reported Historical Monitoring: The patient  reports that he does not  use drugs. List of all UDS Test(s): Lab Results  Component Value Date   COCAINSCRNUR  Negative 10/16/2017   PCPQUANT Negative 10/16/2017   CANNABQUANT Negative 10/16/2017   List of other Serum/Urine Drug Screening Test(s):  Lab Results  Component Value Date   COCAINSCRNUR Negative 10/16/2017   PCPQUANT Negative 10/16/2017   CANNABQUANT Negative 10/16/2017   Historical Background Evaluation: [place] PMP: Six (6) year initial data search conducted.             [place] Department of public safety, offender search: Editor, commissioning Information) Non-contributory Risk Assessment Profile: Aberrant behavior: None observed or detected today Risk factors for fatal opioid overdose: age 38-58 years old and kidney disease Fatal overdose hazard ratio (HR): Calculation deferred Non-fatal overdose hazard ratio (HR): Calculation deferred Risk of opioid abuse or dependence: 0.7-3.0% with doses ? 36 MME/day and 6.1-26% with doses ? 120 MME/day. Substance use disorder (SUD) risk level: Moderate Opioid risk tool (ORT) (Total Score): 0 Opioid Risk Tool - 12/06/17 1047      Family History of Substance Abuse   Alcohol  Negative    Illegal Drugs  Negative    Rx Drugs  Negative      Personal History of Substance Abuse   Alcohol  Negative    Illegal Drugs  Negative    Rx Drugs  Negative      Psychological Disease   Psychological Disease  Negative    Depression  Negative      Total Score   Opioid Risk Tool Scoring  0    Opioid Risk Interpretation  Low Risk      ORT Scoring interpretation table:  Score <3 = Low Risk for SUD  Score between 4-7 = Moderate Risk for SUD  Score >8 = High Risk for Opioid Abuse   PHQ-2 Depression Scale:  Total score:    PHQ-2 Scoring interpretation table: (Score and probability of major depressive disorder)  Score 0 = No depression  Score 1 = 15.4% Probability  Score 2 = 21.1% Probability  Score 3 = 38.4% Probability  Score 4 = 45.5% Probability  Score 5 = 56.4% Probability  Score 6 = 78.6% Probability   PHQ-9 Depression Scale:  Total score:    PHQ-9 Scoring  interpretation table:  Score 0-4 = No depression  Score 5-9 = Mild depression  Score 10-14 = Moderate depression  Score 15-19 = Moderately severe depression  Score 20-27 = Severe depression (2.4 times higher risk of SUD and 2.89 times higher risk of overuse)   Pharmacologic Plan: As per protocol, I have not taken over any controlled substance management, pending the results of ordered tests and/or consults.            Initial impression: Pending review of available data and ordered tests.  Meds   Current Outpatient Medications:  .  amLODipine (NORVASC) 10 MG tablet, Take 1 tablet (10 mg total) by mouth daily., Disp: 90 tablet, Rfl: 1 .  atorvastatin (LIPITOR) 20 MG tablet, TAKE 1 TABLET(20 MG) BY MOUTH AT BEDTIME, Disp: 90 tablet, Rfl: 3 .  Febuxostat (ULORIC) 80 MG TABS, Take 1 tablet by mouth every other day. , Disp: , Rfl:  .  furosemide (LASIX) 40 MG tablet, Take 1 tablet (40 mg total) by mouth every other day., Disp: 45 tablet, Rfl: 1 .  losartan (COZAAR) 50 MG tablet, TAKE 1 TABLET BY MOUTH EVERY DAY, Disp: 90 tablet, Rfl: 0 .  morphine (MS CONTIN) 15 MG 12 hr tablet, Take 1  tablet (15 mg total) by mouth every 12 (twelve) hours., Disp: 60 tablet, Rfl: 0 .  naloxone (NARCAN) nasal spray 4 mg/0.1 mL, Use in case of accidental overdose with narcotics / opioids, Disp: 1 kit, Rfl: 1 .  oxyCODONE-acetaminophen (PERCOCET) 10-325 MG tablet, Take 1 tablet by mouth every 6 (six) hours as needed for pain (max three pills per 24 hours)., Disp: 90 tablet, Rfl: 0 .  predniSONE (DELTASONE) 10 MG tablet, Take 5 pills by mouth today, then 4 on day 2, 3 on day 3, 2 on day 4, and 1 on day 5, Disp: 15 tablet, Rfl: 1 .  Vitamin D, Ergocalciferol, (DRISDOL) 50000 units CAPS capsule, Take 50,000 Units by mouth once a week. , Disp: , Rfl: 0  Imaging Review   Shoulder-R MR wo contrast:  Results for orders placed during the hospital encounter of 05/26/15  MR Shoulder Right Wo Contrast   Narrative CLINICAL  DATA:  Right shoulder pain and limited range of motion for 1 year. Pain has worsened since November 2015.  EXAM: MRI OF THE RIGHT SHOULDER WITHOUT CONTRAST  TECHNIQUE: Multiplanar, multisequence MR imaging of the shoulder was performed. No intravenous contrast was administered.  COMPARISON:  None.  FINDINGS: Examination is limited by patient motion.  Rotator cuff: Significant age advanced rotator cuff tendinopathy/ tendinosis. The distal attachment region of the supraspinatus and infraspinatus tendons are markedly thinned and show bursal and articular surface tears. Interstitial tears are also noted. No discrete full-thickness retracted tear but probably at risk for such.  Muscles:  No significant findings.  Biceps long head:  Intact.  Acromioclavicular Joint: Mild AC joint degenerative changes. The acromion is type 1- 2 in shape. No lateral downsloping or undersurface spurring.  Glenohumeral Joint: Mild glenohumeral joint degenerative changes. No joint effusion. Subcoracoid bursitis is noted.  Labrum:  Intact.  Bones:  No acute bony findings.  IMPRESSION: 1. Significant age advanced rotator cuff tendinopathy/tendinosis with bursal, articular and interstitial tears involving the supraspinatus and infraspinatus tendons. No full thickness retracted tear but probably at high risk for such. 2. Intact biceps tendon and grossly normal glenoid labrum. 3. No significant findings for bony impingement. 4. Mild glenohumeral joint degenerative changes but no joint effusion or synovitis. 5. Subcoracoid bursitis.   Electronically Signed   By: Marijo Sanes M.D.   On: 05/26/2015 13:35    Complexity Note: Imaging results reviewed. Results shared with Mr. Dhondt, using Layman's terms.                         ROS  Cardiovascular History: High blood pressure Pulmonary or Respiratory History: Smoking and Snoring  Neurological History: No reported neurological signs or symptoms  such as seizures, abnormal skin sensations, urinary and/or fecal incontinence, being born with an abnormal open spine and/or a tethered spinal cord Review of Past Neurological Studies: No results found for this or any previous visit. Psychological-Psychiatric History: No reported psychological or psychiatric signs or symptoms such as difficulty sleeping, anxiety, depression, delusions or hallucinations (schizophrenial), mood swings (bipolar disorders) or suicidal ideations or attempts Gastrointestinal History: No reported gastrointestinal signs or symptoms such as vomiting or evacuating blood, reflux, heartburn, alternating episodes of diarrhea and constipation, inflamed or scarred liver, or pancreas or irrregular and/or infrequent bowel movements Genitourinary History: Kidney disease Hematological History: No reported hematological signs or symptoms such as prolonged bleeding, low or poor functioning platelets, bruising or bleeding easily, hereditary bleeding problems, low energy levels due to low hemoglobin  or being anemic Endocrine History: No reported endocrine signs or symptoms such as high or low blood sugar, rapid heart rate due to high thyroid levels, obesity or weight gain due to slow thyroid or thyroid disease Rheumatologic History: No reported rheumatological signs and symptoms such as fatigue, joint pain, tenderness, swelling, redness, heat, stiffness, decreased range of motion, with or without associated rash Musculoskeletal History: Negative for myasthenia gravis, muscular dystrophy, multiple sclerosis or malignant hyperthermia Work History: Working part time and Disabled  Allergies  Mr. Clerk is allergic to nsaids.  Laboratory Chemistry  Inflammation Markers (CRP: Acute Phase) (ESR: Chronic Phase) No results found for: CRP, ESRSEDRATE, LATICACIDVEN               Rheumatology Markers Lab Results  Component Value Date   LABURIC 6.2 10/16/2017                Renal Function  Markers Lab Results  Component Value Date   BUN 50 (H) 10/16/2017   CREATININE 2.22 (H) 10/16/2017   GFRAA 40 (L) 10/16/2017   GFRNONAA 35 (L) 10/16/2017                 Hepatic Function Markers Lab Results  Component Value Date   AST 19 10/16/2017   ALT 16 10/16/2017   ALBUMIN 4.6 10/16/2017   ALKPHOS 95 10/16/2017                 Electrolytes Lab Results  Component Value Date   NA 136 10/16/2017   K 5.2 10/16/2017   CL 101 10/16/2017   CALCIUM 9.4 10/16/2017                 Neuropathy Markers Lab Results  Component Value Date   HGBA1C 5.4 07/06/2017                 Bone Pathology Markers No results found for: [place], [id number], [id number], [id number], 25OHVITD1, 25OHVITD2, 25OHVITD3, TESTOFREE, TESTOSTERONE               Coagulation Parameters Lab Results  Component Value Date   INR 1.02 10/16/2014   LABPROT 13.5 10/16/2014   APTT 37 10/16/2014   PLT 241 06/05/2016                 Cardiovascular Markers Lab Results  Component Value Date   HGB 11.3 (L) 06/05/2016   HCT 33.6 (L) 06/05/2016                 CA Markers No results found for: CEA, CA125, LABCA2               Note: Lab results reviewed.  [place]  Drug: Mr. Drummond  reports that he does not use drugs. Alcohol:  reports that he does not drink alcohol. Tobacco:  reports that he has been smoking cigarettes.  He has a 11.00 pack-year smoking history. he has never used smokeless tobacco. Medical:  has a past medical history of Anemia, Bone spur of other site, Chronic pain, CKD (chronic kidney disease), Controlled substance agreement signed, Gout, Gouty arthritis, Hyperlipidemia, Hypertension, IFG (impaired fasting glucose), Left knee DJD, Medullary cystic disease of the kidney, Primary localized osteoarthritis of right knee, and Smoker. Family: family history includes COPD in his maternal grandmother and mother; Kidney disease in his mother; Stroke (age of onset: 45) in his father; Thyroid disease in his  mother.  Past Surgical History:  Procedure Laterality Date  . CARPAL TUNNEL RELEASE Left   .  CATARACT EXTRACTION W/ INTRAOCULAR LENS IMPLANT Left 2008  . CATARACT EXTRACTION W/ INTRAOCULAR LENS IMPLANT Right 2008  . EYE SURGERY    . HERNIA REPAIR    . JOINT REPLACEMENT Left 03/2014  . TOTAL KNEE ARTHROPLASTY Left 03/30/2014   Procedure: TOTAL KNEE ARTHROPLASTY;  Surgeon: Lorn Junes, MD;  Location: [facility];  Service: Orthopedics;  Laterality: Left;  . TOTAL KNEE ARTHROPLASTY Right 10/26/2014   Procedure: TOTAL KNEE ARTHROPLASTY;  Surgeon: Lorn Junes, MD;  Location: [facility];  Service: Orthopedics;  Laterality: Right;  . UMBILICAL HERNIA REPAIR  2010   Active Ambulatory Problems    Diagnosis Date Noted  . Medullary cystic disease of the kidney   . Left knee DJD   . Chronic pain   . Hypertension   . Gout due to renal impairment 03/30/2014  . DJD (degenerative joint disease) of knee 03/30/2014  . Primary localized osteoarthritis of right knee   . Chronic kidney disease, stage III (moderate) ([facility])   . Anemia   . Hypertriglyceridemia   . IFG (impaired fasting glucose)   . Gouty arthritis   . Controlled substance agreement signed   . Wrist pain, right 11/26/2015  . Medication monitoring encounter 11/26/2015  . Trigger ring finger of right hand 12/24/2015  . Obesity 03/05/2016  . Tobacco abuse 06/05/2016  . Chronic pain syndrome 09/19/2016  . Vitamin D deficiency 03/30/2017  . Opioid use 10/11/2017   Resolved Ambulatory Problems    Diagnosis Date Noted  . Abnormal weight gain 12/24/2015   Past Medical History:  Diagnosis Date  . Anemia   . Bone spur of other site   . Chronic pain   . CKD (chronic kidney disease)   . Controlled substance agreement signed   . Gout   . Gouty arthritis   . Hyperlipidemia   . Hypertension   . IFG (impaired fasting glucose)   . Left knee DJD   . Medullary cystic disease of the kidney   . Primary localized osteoarthritis of right knee    . Smoker    Constitutional Exam  General appearance: Well nourished, well developed, and well hydrated. In no apparent acute distress multiple tattoos Vitals:   12/06/17 1034  BP: (!) 171/98  Pulse: 100  Resp: 16  Temp: 98.4 F (36.9 C)  TempSrc: Oral  SpO2: 100%  Weight: 233 lb (105.7 kg)  Height: 5' 9"  (1.753 m)   BMI Assessment: Estimated body mass index is 34.41 kg/m as calculated from the following:   Height as of this encounter: 5' 9"  (1.753 m).   Weight as of this encounter: 233 lb (105.7 kg).  BMI interpretation table: BMI level Category Range association with higher incidence of chronic pain  <18 kg/m2 Underweight   18.5-24.9 kg/m2 Ideal body weight   25-29.9 kg/m2 Overweight Increased incidence by 20%  30-34.9 kg/m2 Obese (Class I) Increased incidence by 68%  35-39.9 kg/m2 Severe obesity (Class II) Increased incidence by 136%  >40 kg/m2 Extreme obesity (Class III) Increased incidence by 254%   BMI Readings from Last 4 Encounters:  12/06/17 34.41 kg/m  11/23/17 35.43 kg/m  10/26/17 36.64 kg/m  10/11/17 36.78 kg/m   Wt Readings from Last 4 Encounters:  12/06/17 233 lb (105.7 kg)  11/23/17 233 lb (105.7 kg)  10/26/17 241 lb (109.3 kg)  10/11/17 241 lb 14.4 oz (109.7 kg)  Psych/Mental status: Alert, oriented x 3 (person, place, & time)       Eyes: PERLA Respiratory: No evidence  of acute respiratory distress  Cervical Spine Area Exam  Skin & Axial Inspection: No masses, redness, edema, swelling, or associated skin lesions Alignment: Symmetrical Functional ROM: Unrestricted ROM      Stability: No instability detected Muscle Tone/Strength: Functionally intact. No obvious neuro-muscular anomalies detected. Sensory (Neurological): Unimpaired Palpation: No palpable anomalies              Upper Extremity (UE) Exam    Side: Right upper extremity  Side: Left upper extremity  Skin & Extremity Inspection: Skin color, temperature, and hair growth are WNL. No  peripheral edema or cyanosis. No masses, redness, swelling, asymmetry, or associated skin lesions. No contractures.  Skin & Extremity Inspection: Skin color, temperature, and hair growth are WNL. No peripheral edema or cyanosis. No masses, redness, swelling, asymmetry, or associated skin lesions. No contractures.  Functional ROM: Unrestricted ROM          Functional ROM: Unrestricted ROM          Muscle Tone/Strength: Functionally intact. No obvious neuro-muscular anomalies detected.  Muscle Tone/Strength: Functionally intact. No obvious neuro-muscular anomalies detected.  Sensory (Neurological): Unimpaired          Sensory (Neurological): Unimpaired          Palpation: No palpable anomalies              Palpation: No palpable anomalies              Specialized Test(s): Deferred         Specialized Test(s): Deferred          Thoracic Spine Area Exam  Skin & Axial Inspection: No masses, redness, or swelling Alignment: Symmetrical Functional ROM: Unrestricted ROM Stability: No instability detected Muscle Tone/Strength: Functionally intact. No obvious neuro-muscular anomalies detected. Sensory (Neurological): Unimpaired Muscle strength & Tone: No palpable anomalies  Lumbar Spine Area Exam  Skin & Axial Inspection: No masses, redness, or swelling Alignment: Symmetrical Functional ROM: Unrestricted ROM      Stability: No instability detected Muscle Tone/Strength: Functionally intact. No obvious neuro-muscular anomalies detected. Sensory (Neurological): Unimpaired Palpation: No palpable anomalies       Provocative Tests: Lumbar Hyperextension and rotation test: evaluation deferred today       Lumbar Lateral bending test: evaluation deferred today       Patrick's Maneuver: evaluation deferred today                    Gait & Posture Assessment  Ambulation: Unassisted Gait: Relatively normal for age and body habitus Posture: WNL   Lower Extremity Exam    Side: Right lower extremity  Side:  Left lower extremity  Skin & Extremity Inspection: Evidence of prior arthroplastic surgery  Skin & Extremity Inspection: Evidence of prior arthroplastic surgery  Functional ROM: Unrestricted ROM          Functional ROM: Unrestricted ROM          Muscle Tone/Strength: Functionally intact. No obvious neuro-muscular anomalies detected.  Muscle Tone/Strength: Functionally intact. No obvious neuro-muscular anomalies detected.  Sensory (Neurological): Unimpaired  Sensory (Neurological): Unimpaired  Palpation: No palpable anomalies  Palpation: No palpable anomalies   Assessment  Primary Diagnosis & Pertinent Problem List: The primary encounter diagnosis was Chronic pain syndrome. 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  Visit  Diagnosis (New problems to examiner): 1. Chronic pain syndrome   2. 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 History of bilateral knee replacement   4. Primary osteoarthritis of both knees   5. Medullary cystic disease of the kidney   6. Chronic kidney disease, stage III (moderate) ([place])   General Recommendations: The pain condition that the patient suffers from is best treated with a multidisciplinary approach that involves an increase in physical activity to prevent de-conditioning and worsening of the pain cycle, as well as psychological counseling (formal and/or informal) to address the co-morbid psychological affects of pain. Treatment will often involve judicious use of pain medications and interventional procedures to decrease the pain, allowing the patient to participate in the physical activity that will ultimately produce long-lasting pain reductions. The goal of the multidisciplinary approach is to  return the patient to a higher level of overall function and to restore their ability to perform activities of daily living.  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  Patient has been on chronic opioid therapy for the last 13 years.  I had an extensive discussion with the patient about my concerns regarding opioid intake and its risk especially at his age.  Patient has been compliant with his medications as well as his urine drug screens.  There is been no aberrant behavior noted.  Furthermore, patient's gout is very debilitating and has resulted in advanced degenerative disease he is status post bilateral knee replacements.  Patient has 6 children and works part-time.  He denies any alcohol or substance abuse.  I will attempt to wean his opioid intake if I take him on as the patient.  I made this clear from the beginning.  I told him that we would not exceed 60 morphine milliequivalents.  He was in agreement with the plan.  In accordance with our policy, will obtain a baseline urine drug screen.  I spent this to be positive for morphine and oxycodone.  I will also send the patient for pain psychology referral regarding substance abuse disorder evaluation.  Plan: -UDS today -Pain psychology -Instructed patient that pending psych eval and UDS, we would reduce his opioid intake to oxycodone 10 mg 3 times daily as needed, quantity 25-month morphine 15 mg daily (7.5 twice daily) which would equate to a total MME of 60. -Patient unable to tolerate NSAID therapy given his medullary cystic kidney disease -Patient has tried various membrane stabilizers including gabapentin in the past which resulted in  sedation and inability to work.  Patient is also tried various muscle relaxants which resulted in sedation is not interested in trying these medications.  Future considerations: Bilateral genicular nerve block  Orders Placed This Encounter  Procedures  . Compliance Drug Analysis, Ur  . Ambulatory referral to Psychology    Provider-requested follow-up: Return in about 2 weeks (around 12/20/2017) for Medication Management.  Future Appointments  Date Time Provider [place] 12/17/2017 11:15 AM LGillis Santa MD [facility] None  12/21/2017 10:00 AM Lada, MSatira Anis MD [email or web address]    Primary Care Physician: LArnetha Courser MD [facility] Note by: BGillis Santa M.D, Date: 12/06/2017; Time: 1:23 PM  Patient Instructions  1. UDS 2. Pain Psych

## 2017-12-06 NOTE — Patient Instructions (Signed)
1. UDS 2. Pain Psych

## 2017-12-06 NOTE — Progress Notes (Deleted)
Safety precautions to be maintained throughout the outpatient stay will include: orient to surroundings, keep bed in low position, maintain call bell within reach at all times, provide assistance with transfer out of bed and ambulation.  

## 2017-12-12 LAB — COMPLIANCE DRUG ANALYSIS, UR

## 2017-12-14 ENCOUNTER — Telehealth: Payer: Self-pay | Admitting: Family Medicine

## 2017-12-14 NOTE — Telephone Encounter (Signed)
The "60" that the pain clinic doctor is referring to is "morphine milliequivalents", not milligrams He had been on 60 mg per the numbers on his bottles (15+15+30 = 60), but that does not equal the morphine milliequivalent number which is actually higher We'll stick with the recommendation of the pain clinic doctor to have him decrease his MME

## 2017-12-14 NOTE — Telephone Encounter (Signed)
Copied from CRM [id number]. Topic: Quick Communication - See Telephone Encounter >> Dec 14, 2017  3:00 PM Diana EvesHoyt, Maryann B wrote: CRM for notification. See Telephone encounter for:  Pt needing Dr. Sherie DonLada to call his pain management doctor. His doctor is wanting him to go down to 60 mg on the oxycodone and he is already at 60 mg  12/14/17.

## 2017-12-14 NOTE — Telephone Encounter (Signed)
Pt notified and Dr. lada spoke with patient

## 2017-12-17 ENCOUNTER — Encounter: Payer: Self-pay | Admitting: Student in an Organized Health Care Education/Training Program

## 2017-12-17 ENCOUNTER — Ambulatory Visit
Payer: Medicare Other | Attending: Student in an Organized Health Care Education/Training Program | Admitting: Student in an Organized Health Care Education/Training Program

## 2017-12-17 VITALS — BP 162/87 | HR 100 | Temp 98.2°F | Resp 16 | Ht 69.5 in | Wt 233.0 lb

## 2017-12-17 DIAGNOSIS — Z9841 Cataract extraction status, right eye: Secondary | ICD-10-CM | POA: Diagnosis not present

## 2017-12-17 DIAGNOSIS — Z96653 Presence of artificial knee joint, bilateral: Secondary | ICD-10-CM | POA: Insufficient documentation

## 2017-12-17 DIAGNOSIS — Z886 Allergy status to analgesic agent status: Secondary | ICD-10-CM | POA: Diagnosis not present

## 2017-12-17 DIAGNOSIS — G894 Chronic pain syndrome: Secondary | ICD-10-CM | POA: Diagnosis not present

## 2017-12-17 DIAGNOSIS — N183 Chronic kidney disease, stage 3 unspecified: Secondary | ICD-10-CM

## 2017-12-17 DIAGNOSIS — Q615 Medullary cystic kidney: Secondary | ICD-10-CM | POA: Insufficient documentation

## 2017-12-17 DIAGNOSIS — M170 Bilateral primary osteoarthritis of knee: Secondary | ICD-10-CM | POA: Insufficient documentation

## 2017-12-17 DIAGNOSIS — Z79891 Long term (current) use of opiate analgesic: Secondary | ICD-10-CM | POA: Insufficient documentation

## 2017-12-17 DIAGNOSIS — I129 Hypertensive chronic kidney disease with stage 1 through stage 4 chronic kidney disease, or unspecified chronic kidney disease: Secondary | ICD-10-CM | POA: Diagnosis not present

## 2017-12-17 DIAGNOSIS — M1A39X1 Chronic gout due to renal impairment, multiple sites, with tophus (tophi): Secondary | ICD-10-CM | POA: Diagnosis not present

## 2017-12-17 DIAGNOSIS — Z9889 Other specified postprocedural states: Secondary | ICD-10-CM | POA: Diagnosis not present

## 2017-12-17 DIAGNOSIS — Z823 Family history of stroke: Secondary | ICD-10-CM | POA: Insufficient documentation

## 2017-12-17 DIAGNOSIS — Z836 Family history of other diseases of the respiratory system: Secondary | ICD-10-CM | POA: Diagnosis not present

## 2017-12-17 DIAGNOSIS — E785 Hyperlipidemia, unspecified: Secondary | ICD-10-CM | POA: Insufficient documentation

## 2017-12-17 DIAGNOSIS — Z841 Family history of disorders of kidney and ureter: Secondary | ICD-10-CM | POA: Diagnosis not present

## 2017-12-17 DIAGNOSIS — Z79899 Other long term (current) drug therapy: Secondary | ICD-10-CM | POA: Diagnosis not present

## 2017-12-17 DIAGNOSIS — Z9842 Cataract extraction status, left eye: Secondary | ICD-10-CM | POA: Insufficient documentation

## 2017-12-17 DIAGNOSIS — Z8349 Family history of other endocrine, nutritional and metabolic diseases: Secondary | ICD-10-CM | POA: Diagnosis not present

## 2017-12-17 DIAGNOSIS — D649 Anemia, unspecified: Secondary | ICD-10-CM | POA: Diagnosis not present

## 2017-12-17 DIAGNOSIS — F17210 Nicotine dependence, cigarettes, uncomplicated: Secondary | ICD-10-CM | POA: Diagnosis not present

## 2017-12-17 MED ORDER — OXYCODONE-ACETAMINOPHEN 10-325 MG PO TABS
1.0000 | ORAL_TABLET | Freq: Three times a day (TID) | ORAL | 0 refills | Status: DC | PRN
Start: 2017-12-17 — End: 2018-01-17

## 2017-12-17 MED ORDER — MORPHINE SULFATE ER 15 MG PO TBCR: 15.0000 mg | EXTENDED_RELEASE_TABLET | Freq: Every day | ORAL | 0 refills | Status: DC

## 2017-12-17 NOTE — Progress Notes (Signed)
Safety precautions to be maintained throughout the outpatient stay will include: orient to surroundings, keep bed in low position, maintain call bell within reach at all times, provide assistance with transfer out of bed and ambulation.  

## 2017-12-17 NOTE — Progress Notes (Signed)
atient's Name: Nathan Ramos  MRN: [id number]  Referring Provider: Arnetha Courser, MD  DOB: 03/27/1971  PCP: Arnetha Courser, MD  DOS: 12/17/2017  Note by: Gillis Santa, MD  Service setting: Ambulatory outpatient  Specialty: Interventional Pain Management  Location: [facility] (AMB) Pain Management Facility    Patient type: Established   Primary Reason(s) for Visit: Encounter for evaluation before starting new chronic pain management plan of care (Level of risk: moderate) CC: Joint Pain (generalized) and Generalized Body Aches (body hurts all over d/t nerve pain)  HPI  Nathan Ramos is a 47 y.o. year old, male patient, who comes today for a follow-up evaluation to review the test results and decide on a treatment plan. He has Medullary cystic disease of the kidney; Left knee DJD; Chronic pain; Hypertension; Gout due to renal impairment; DJD (degenerative joint disease) of knee; Primary localized osteoarthritis of right knee; Chronic kidney disease, stage III (moderate) ([place]); Anemia; Hypertriglyceridemia; IFG (impaired fasting glucose); Gouty arthritis; Controlled substance agreement signed; Wrist pain, right; Medication monitoring encounter; Trigger ring finger of right hand; Obesity; Tobacco abuse; Chronic pain syndrome; Vitamin D deficiency; and Opioid use on their problem list. His primarily concern today is the Joint Pain (generalized) and Generalized Body Aches (body hurts all over d/t nerve pain)  Pain Assessment: Location:   (generalized pain) Radiating: neuropathies Onset: More than a month ago Duration: Chronic pain Quality: Burning, Constant, Numbness, Discomfort, Throbbing Severity: 5 /10 (self-reported pain score)  Note: Reported level is inconsistent with clinical observations. Clinically the patient looks like a 3/10             When using our objective Pain Scale, levels between 6 and 10/10 are said to belong in an emergency room, as it progressively worsens from a 6/10, described as severely  limiting, requiring emergency care not usually available at an outpatient pain management facility. At a 6/10 level, communication becomes difficult and requires great effort. Assistance to reach the emergency department may be required. Facial flushing and profuse sweating along with potentially dangerous increases in heart rate and blood pressure will be evident. Effect on ADL: if performs too much activity increases pain level Timing: Constant Modifying factors: rest   Nathan Ramos comes in today for a follow-up visit after his initial evaluation on 12/06/2017. Today we went over the results of his tests. These were explained in "Layman's terms". During today's appointment we went over my diagnostic impression, as well as the proposed treatment plan.  Patient returns today for his follow-up.  His UDS is appropriate as below.  I will take over his opioid medications at a lower dose.  We discussed morphine 15  daily as needed (MME equals 15), Percocet at current dose of 10 mg 3 times daily as needed, quantity 90 (MME equals 45).  In considering the treatment plan options, Mr. Whetsel was reminded that I no longer take patients for medication management only. I asked him to let me know if he had no intention of taking advantage of the interventional therapies, so that we could make arrangements to provide this space to someone interested. I also made it clear that undergoing interventional therapies for the purpose of getting pain medications is very inappropriate on the part of a patient, and it will not be tolerated in this practice. This type of behavior would suggest true addiction and therefore it requires referral to an addiction specialist.   Further details on both, my assessment(s), as well as the proposed treatment  plan, please see below.  Controlled Substance Pharmacotherapy Assessment REMS (Risk Evaluation and Mitigation Strategy)  Analgesic: morphine 7.5 mg twice daily as needed (MME equals  15), Percocet at current dose of 10 mg 3 times daily as needed, quantity 90 (MME equals 45) MME/day: 60 mg/day. Pill Count: None expected due to no prior prescriptions written by our practice. Janett Billow, RN  12/17/2017 11:23 AM  Sign at close encounter Safety precautions to be maintained throughout the outpatient stay will include: orient to surroundings, keep bed in low position, maintain call bell within reach at all times, provide assistance with transfer out of bed and ambulation.    Pharmacokinetics: Liberation and absorption (onset of action): WNL Distribution (time to peak effect): WNL Metabolism and excretion (duration of action): WNL         Pharmacodynamics: Desired effects: Analgesia: Nathan Ramos reports >50% benefit. Functional ability: Patient reports that medication allows him to accomplish basic ADLs Clinically meaningful improvement in function (CMIF): Sustained CMIF goals met Perceived effectiveness: Described as relatively effective, allowing for increase in activities of daily living (ADL) Undesirable effects: Side-effects or Adverse reactions: None reported Monitoring:  PMP: Online review of the past 24-monthperiod previously conducted. Not applicable at this point since we have not taken over the patient's medication management yet. List of other Serum/Urine Drug Screening Test(s):  Lab Results  Component Value Date   COCAINSCRNUR Negative 10/16/2017   CANNABQUANT Negative 10/16/2017   List of all UDS test(s) done:  Lab Results  Component Value Date   TOXASSSELUR FINAL 11/26/2015   TFairviewFINAL 06/02/2015   SUMMARY FINAL 12/06/2017   Last UDS on record: ToxAssure Select 13  Date Value Ref Range Status  11/26/2015 FINAL  Final    Comment:    ==================================================================== TOXASSURE SELECT 13 (MW) ==================================================================== Test                             Result        Flag       Units Drug Present and Declared for Prescription Verification   Morphine                       9414         EXPECTED   ng/mg creat    Potential sources of large amounts of morphine in the absence of    codeine include administration of morphine or use of heroin.   Hydromorphone                  125          EXPECTED   ng/mg creat    Hydromorphone may be present as a metabolite of morphine;    concentrations of hydromorphone rarely exceed 5% of the morphine    concentration when this is the source of hydromorphone.   Oxycodone                      516          EXPECTED   ng/mg creat   Oxymorphone                    1661         EXPECTED   ng/mg creat   Noroxycodone                   454  EXPECTED   ng/mg creat   Noroxymorphone                 261          EXPECTED   ng/mg creat    Sources of oxycodone are scheduled prescription medications.    Oxymorphone, noroxycodone, and noroxymorphone are expected    metabolites of oxycodone. Oxymorphone is also available as a    scheduled prescription medication. ==================================================================== Test                      Result    Flag   Units      Ref Range   Creatinine              69               mg/dL      >=20 ==================================================================== Declared Medications:  The flagging and interpretation on this report are based on the  following declared medications.  Unexpected results may arise from  inaccuracies in the declared medications.  **Note: The testing scope of this panel includes these medications:  Morphine  Oxycodone ==================================================================== For clinical consultation, please call [phone]. ====================================================================    Summary  Date Value Ref Range Status  12/06/2017 FINAL  Final    Comment:     ==================================================================== TOXASSURE COMP DRUG ANALYSIS,UR ==================================================================== Test                             Result       Flag       Units Drug Present and Declared for Prescription Verification   Morphine                       9409         EXPECTED   ng/mg creat   Normorphine                    218          EXPECTED   ng/mg creat    Potential sources of large amounts of morphine in the absence of    codeine include administration of morphine or use of heroin.    Normorphine is an expected metabolite of morphine.   Oxycodone                      1325         EXPECTED   ng/mg creat   Oxymorphone                    1877         EXPECTED   ng/mg creat   Noroxycodone                   677          EXPECTED   ng/mg creat   Noroxymorphone                 243          EXPECTED   ng/mg creat    Sources of oxycodone are scheduled prescription medications.    Oxymorphone, noroxycodone, and noroxymorphone are expected    metabolites of oxycodone. Oxymorphone is also available as a    scheduled prescription medication.   Acetaminophen  PRESENT      EXPECTED ==================================================================== Test                      Result    Flag   Units      Ref Range   Creatinine              44               mg/dL      >=20 ==================================================================== Declared Medications:  The flagging and interpretation on this report are based on the  following declared medications.  Unexpected results may arise from  inaccuracies in the declared medications.  **Note: The testing scope of this panel includes these medications:  Morphine (Morphine Sulfate)  Oxycodone (Oxycodone Acetaminophen)  **Note: The testing scope of this panel does not include small to  moderate amounts of these reported medications:  Acetaminophen (Oxycodone  Acetaminophen)  **Note: The testing scope of this panel does not include following  reported medications:  Amlodipine  Atorvastatin  Febuxostat  Furosemide  Losartan (Losartan Potassium)  Naloxone  Prednisone  Vitamin D2 (Ergocalciferol) ==================================================================== For clinical consultation, please call [phone]. ====================================================================    UDS interpretation: No unexpected findings.          Medication Assessment Form: Patient introduced to form today Treatment compliance: Treatment may start today if patient agrees with proposed plan. Evaluation of compliance is not applicable at this point Risk Assessment Profile: Aberrant behavior: See initial evaluations. None observed or detected today Comorbid factors increasing risk of overdose: See initial evaluation. No additional risks detected today Medical Psychology Evaluation: Please see scanned results in medical record. Opioid Risk Tool - 12/06/17 1047      Family History of Substance Abuse   Alcohol  Negative    Illegal Drugs  Negative    Rx Drugs  Negative      Personal History of Substance Abuse   Alcohol  Negative    Illegal Drugs  Negative    Rx Drugs  Negative      Psychological Disease   Psychological Disease  Negative    Depression  Negative      Total Score   Opioid Risk Tool Scoring  0    Opioid Risk Interpretation  Low Risk      ORT Scoring interpretation table:  Score <3 = Low Risk for SUD  Score between 4-7 = Moderate Risk for SUD  Score >8 = High Risk for Opioid Abuse   Risk Mitigation Strategies:  Patient opioid safety counseling: Completed today. Counseling provided to patient as per "Patient Counseling Document". Document signed by patient, attesting to counseling and understanding Patient-Prescriber Agreement (PPA): Obtained today.  Controlled substance notification to other providers: Written and sent  today.  Pharmacologic Plan: Today we may be taking over the patient's pharmacological regimen. See below.             Laboratory Chemistry  Inflammation Markers (CRP: Acute Phase) (ESR: Chronic Phase) No results found for: CRP, ESRSEDRATE, LATICACIDVEN               Rheumatology Markers Lab Results  Component Value Date   LABURIC 6.2 10/16/2017                Renal Function Markers Lab Results  Component Value Date   BUN 50 (H) 10/16/2017   CREATININE 2.22 (H) 10/16/2017   GFRAA 40 (L) 10/16/2017   GFRNONAA 35 (L) 10/16/2017  Hepatic Function Markers Lab Results  Component Value Date   AST 19 10/16/2017   ALT 16 10/16/2017   ALBUMIN 4.6 10/16/2017   ALKPHOS 95 10/16/2017                 Electrolytes Lab Results  Component Value Date   NA 136 10/16/2017   K 5.2 10/16/2017   CL 101 10/16/2017   CALCIUM 9.4 10/16/2017                 Neuropathy Markers Lab Results  Component Value Date   HGBA1C 5.4 07/06/2017                 Bone Pathology Markers No results found for: [place], [id number], EO7121FX5, OI3254DI2, 25OHVITD1, 25OHVITD2, 25OHVITD3, TESTOFREE, TESTOSTERONE               Coagulation Parameters Lab Results  Component Value Date   INR 1.02 10/16/2014   LABPROT 13.5 10/16/2014   APTT 37 10/16/2014   PLT 241 06/05/2016                 Cardiovascular Markers Lab Results  Component Value Date   HGB 11.3 (L) 06/05/2016   HCT 33.6 (L) 06/05/2016                 CA Markers No results found for: CEA, CA125, LABCA2               Note: Lab results reviewed.  Recent Diagnostic Imaging Review   Shoulder-R MR wo contrast:  Results for orders placed during the hospital encounter of 05/26/15  MR Shoulder Right Wo Contrast   Narrative CLINICAL DATA:  Right shoulder pain and limited range of motion for 1 year. Pain has worsened since November 2015.  EXAM: MRI OF THE RIGHT SHOULDER WITHOUT CONTRAST  TECHNIQUE: Multiplanar,  multisequence MR imaging of the shoulder was performed. No intravenous contrast was administered.  COMPARISON:  None.  FINDINGS: Examination is limited by patient motion.  Rotator cuff: Significant age advanced rotator cuff tendinopathy/ tendinosis. The distal attachment region of the supraspinatus and infraspinatus tendons are markedly thinned and show bursal and articular surface tears. Interstitial tears are also noted. No discrete full-thickness retracted tear but probably at risk for such.  Muscles:  No significant findings.  Biceps long head:  Intact.  Acromioclavicular Joint: Mild AC joint degenerative changes. The acromion is type 1- 2 in shape. No lateral downsloping or undersurface spurring.  Glenohumeral Joint: Mild glenohumeral joint degenerative changes. No joint effusion. Subcoracoid bursitis is noted.  Labrum:  Intact.  Bones:  No acute bony findings.  IMPRESSION: 1. Significant age advanced rotator cuff tendinopathy/tendinosis with bursal, articular and interstitial tears involving the supraspinatus and infraspinatus tendons. No full thickness retracted tear but probably at high risk for such. 2. Intact biceps tendon and grossly normal glenoid labrum. 3. No significant findings for bony impingement. 4. Mild glenohumeral joint degenerative changes but no joint effusion or synovitis. 5. Subcoracoid bursitis.   Electronically Signed   By: Marijo Sanes M.D.   On: 05/26/2015 13:35     Complexity Note: Imaging results reviewed. Results shared with Mr. Moch, using Layman's terms.                         Meds   Current Outpatient Medications:  .  amLODipine (NORVASC) 10 MG tablet, Take 1 tablet (10 mg total) by mouth daily., Disp: 90 tablet, Rfl: 1 .  atorvastatin (LIPITOR) 20 MG tablet, TAKE 1 TABLET(20 MG) BY MOUTH AT BEDTIME, Disp: 90 tablet, Rfl: 3 .  furosemide (LASIX) 40 MG tablet, Take 1 tablet (40 mg total) by mouth every other day., Disp: 45  tablet, Rfl: 1 .  losartan (COZAAR) 50 MG tablet, TAKE 1 TABLET BY MOUTH EVERY DAY, Disp: 90 tablet, Rfl: 0 .  morphine (MS CONTIN) 15 MG 12 hr tablet, Take 1 tablet (15 mg total) by mouth daily. For chronic pain -To fill on or after 12/26/2017, Disp: 30 tablet, Rfl: 0 .  oxyCODONE-acetaminophen (PERCOCET) 10-325 MG tablet, Take 1 tablet by mouth every 8 (eight) hours as needed for pain (max three pills per 24 hours). For chronic pain -To fill on or after 12/26/2017, Disp: 90 tablet, Rfl: 0 .  predniSONE (DELTASONE) 10 MG tablet, Take 5 pills by mouth today, then 4 on day 2, 3 on day 3, 2 on day 4, and 1 on day 5, Disp: 15 tablet, Rfl: 1 .  Febuxostat (ULORIC) 80 MG TABS, Take 1 tablet by mouth every other day. , Disp: , Rfl:  .  naloxone (NARCAN) nasal spray 4 mg/0.1 mL, Use in case of accidental overdose with narcotics / opioids (Patient not taking: Reported on 12/17/2017), Disp: 1 kit, Rfl: 1 .  Vitamin D, Ergocalciferol, (DRISDOL) 50000 units CAPS capsule, Take 50,000 Units by mouth once a week. , Disp: , Rfl: 0  ROS  Constitutional: Denies any fever or chills Gastrointestinal: No reported hemesis, hematochezia, vomiting, or acute GI distress Musculoskeletal: Denies any acute onset joint swelling, redness, loss of ROM, or weakness Neurological: No reported episodes of acute onset apraxia, aphasia, dysarthria, agnosia, amnesia, paralysis, loss of coordination, or loss of consciousness  Allergies  Mr. Nicotra is allergic to nsaids.  [place]  Drug: Mr. Hinely  reports that he does not use drugs. Alcohol:  reports that he does not drink alcohol. Tobacco:  reports that he has been smoking cigarettes.  He has a 11.00 pack-year smoking history. he has never used smokeless tobacco. Medical:  has a past medical history of Anemia, Bone spur of other site, Chronic pain, CKD (chronic kidney disease), Controlled substance agreement signed, Gout, Gouty arthritis, Hyperlipidemia, Hypertension, IFG (impaired fasting  glucose), Left knee DJD, Medullary cystic disease of the kidney, Primary localized osteoarthritis of right knee, and Smoker. Surgical: Mr. Diekman  has a past surgical history that includes Carpal tunnel release (Left); Umbilical hernia repair (2010); Cataract extraction w/ intraocular lens implant (Left, 2008); Cataract extraction w/ intraocular lens implant (Right, 2008); Hernia repair; Total knee arthroplasty (Left, 03/30/2014); Eye surgery; Total knee arthroplasty (Right, 10/26/2014); and Joint replacement (Left, 03/2014). Family: family history includes COPD in his maternal grandmother and mother; Kidney disease in his mother; Stroke (age of onset: 53) in his father; Thyroid disease in his mother.  Constitutional Exam  General appearance: Well nourished, well developed, and well hydrated. In no apparent acute distress Vitals:   12/17/17 1126  BP: (!) 162/87  Pulse: 100  Resp: 16  Temp: 98.2 F (36.8 C)  TempSrc: Oral  SpO2: 99%  Weight: 233 lb (105.7 kg)  Height: 5' 9.5" (1.765 m)   BMI Assessment: Estimated body mass index is 33.91 kg/m as calculated from the following:   Height as of this encounter: 5' 9.5" (1.765 m).   Weight as of this encounter: 233 lb (105.7 kg).  BMI interpretation table: BMI level Category Range association with higher incidence of chronic pain  <18 kg/m2 Underweight  18.5-24.9 kg/m2 Ideal body weight   25-29.9 kg/m2 Overweight Increased incidence by 20%  30-34.9 kg/m2 Obese (Class I) Increased incidence by 68%  35-39.9 kg/m2 Severe obesity (Class II) Increased incidence by 136%  >40 kg/m2 Extreme obesity (Class III) Increased incidence by 254%   BMI Readings from Last 4 Encounters:  12/17/17 33.91 kg/m  12/06/17 34.41 kg/m  11/23/17 35.43 kg/m  10/26/17 36.64 kg/m   Wt Readings from Last 4 Encounters:  12/17/17 233 lb (105.7 kg)  12/06/17 233 lb (105.7 kg)  11/23/17 233 lb (105.7 kg)  10/26/17 241 lb (109.3 kg)  Psych/Mental status: Alert,  oriented x 3 (person, place, & time)       Eyes: PERLA Respiratory: No evidence of acute respiratory distress  Cervical Spine Area Exam  Skin & Axial Inspection: No masses, redness, edema, swelling, or associated skin lesions Alignment: Symmetrical Functional ROM: Unrestricted ROM      Stability: No instability detected Muscle Tone/Strength: Functionally intact. No obvious neuro-muscular anomalies detected. Sensory (Neurological): Unimpaired Palpation: No palpable anomalies              Upper Extremity (UE) Exam    Side: Right upper extremity  Side: Left upper extremity  Skin & Extremity Inspection: Skin color, temperature, and hair growth are WNL. No peripheral edema or cyanosis. No masses, redness, swelling, asymmetry, or associated skin lesions. No contractures.  Skin & Extremity Inspection: Skin color, temperature, and hair growth are WNL. No peripheral edema or cyanosis. No masses, redness, swelling, asymmetry, or associated skin lesions. No contractures.  Functional ROM: Unrestricted ROM          Functional ROM: Unrestricted ROM          Muscle Tone/Strength: Functionally intact. No obvious neuro-muscular anomalies detected.  Muscle Tone/Strength: Functionally intact. No obvious neuro-muscular anomalies detected.  Sensory (Neurological): Unimpaired          Sensory (Neurological): Unimpaired          Palpation: No palpable anomalies              Palpation: No palpable anomalies              Specialized Test(s): Deferred         Specialized Test(s): Deferred          Thoracic Spine Area Exam  Skin & Axial Inspection: No masses, redness, or swelling Alignment: Symmetrical Functional ROM: Unrestricted ROM Stability: No instability detected Muscle Tone/Strength: Functionally intact. No obvious neuro-muscular anomalies detected. Sensory (Neurological): Unimpaired Muscle strength & Tone: No palpable anomalies  Lumbar Spine Area Exam  Skin & Axial Inspection: No masses, redness, or  swelling Alignment: Symmetrical Functional ROM: Unrestricted ROM      Stability: No instability detected Muscle Tone/Strength: Functionally intact. No obvious neuro-muscular anomalies detected. Sensory (Neurological): Unimpaired Palpation: No palpable anomalies       Provocative Tests: Lumbar Hyperextension and rotation test: evaluation deferred today       Lumbar Lateral bending test: evaluation deferred today       Patrick's Maneuver: evaluation deferred today                    Gait & Posture Assessment  Ambulation: Unassisted Gait: Relatively normal for age and body habitus Posture: WNL   Lower Extremity Exam    Side: Right lower extremity  Side: Left lower extremity  Skin & Extremity Inspection: Evidence of prior arthroplastic surgery  Skin & Extremity Inspection: Evidence of prior arthroplastic surgery  Functional ROM:  Unrestricted ROM          Functional ROM: Unrestricted ROM          Muscle Tone/Strength: Functionally intact. No obvious neuro-muscular anomalies detected.  Muscle Tone/Strength: Functionally intact. No obvious neuro-muscular anomalies detected.  Sensory (Neurological): Unimpaired  Sensory (Neurological): Unimpaired  Palpation: No palpable anomalies  Palpation: No palpable anomalies   Assessment & Plan  Primary Diagnosis & Pertinent Problem List: The primary encounter diagnosis was Chronic pain syndrome. 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  Visit Diagnosis: 1. Chronic pain syndrome   2. 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 History of bilateral knee replacement   4. Primary osteoarthritis of both knees   5. Medullary cystic disease of the kidney   6. 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  Patient has been on chronic opioid therapy for the last 13 years.  I have had patient returns today for follow-up.  As his urine drug screen was reviewed and is appropriate.  Extensive discussion with the patient about my concerns regarding opioid intake and its risk especially at his age.  Patient has been compliant with his medications as well as his urine drug screens.  There is been no aberrant behavior noted.  Furthermore, patient's gout is very debilitating and has resulted in advanced degenerative disease he is status post bilateral knee replacements.  Patient has 6 children and works part-time.  He denies any alcohol or substance abuse.   Patient has seen pain psychology and is deemed low risk for substance abuse disorder.  As discussed on his initial visit, I am willing to take over his opioid regimen however at a reduced dose.  He can continue his original dose of Percocet 10 mg 3 times daily as needed, quantity 90/month and we will reduce his MS Contin to 15 mg daily) patient can take 7.5 mg twice daily as needed).  This is a dose reduction to 60 morphine milliequivalents which is our clinic policy.  My goal is to continue to wean his opioid therapy down over the upcoming months.  Plan: -Sign opiate agreement today -Prescription for morphine 15 mg daily, quantity 30.  Okay for patient to take 7.5 mill grams twice daily.  -Prescription for Percocet 10 mg 3 times daily as needed, quantity 90. 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  Patient is also tried various muscle relaxants which resulted in sedation is not interested in trying these medications.  Requested Prescriptions   Signed Prescriptions Disp Refills  . morphine (MS CONTIN) 15 MG 12 hr tablet 30 tablet 0    Sig: Take 1 tablet (15 mg total) by mouth daily. For chronic pain -To fill on or after 12/26/2017  . oxyCODONE-acetaminophen (PERCOCET) 10-325 MG tablet 90 tablet 0  Sig: Take 1 tablet by mouth every 8 (eight) hours as needed for pain (max three pills per 24 hours). For chronic pain -To fill on or after 12/26/2017     Provider-requested follow-up: Return in about 5 weeks (around 01/21/2018) for Medication Management.   Time Note: Greater than 50% of the 25 minute(s) of face-to-face time spent with Mr. Lesko, was spent in counseling/coordination of care regarding: opioid tolerance, Mr. Rathbun primary cause of pain, the treatment plan, medication side effects, the opioid analgesic risks and possible complications, the appropriate use of his medications, realistic expectations and the medication agreement. Future Appointments  Date Time Provider [place]  12/21/2017 10:00 AM Arnetha Courser, MD [facility]  01/17/2018 10:30 AM Gillis Santa, MD [facility] None    Primary Care Physician: Arnetha Courser, MD Location: [facility] Note by: Gillis Santa, M.D Date: 12/17/2017; Time: 12:51 PM  Patient Instructions  1. Please sign opioid agreement

## 2017-12-17 NOTE — Patient Instructions (Addendum)
1. Please sign opioid agreement

## 2017-12-21 ENCOUNTER — Ambulatory Visit (INDEPENDENT_AMBULATORY_CARE_PROVIDER_SITE_OTHER): Payer: Medicare Other | Admitting: Family Medicine

## 2017-12-21 ENCOUNTER — Encounter: Payer: Self-pay | Admitting: Family Medicine

## 2017-12-21 VITALS — BP 146/74 | HR 95 | Temp 98.2°F | Resp 14 | Wt 240.8 lb

## 2017-12-21 DIAGNOSIS — Z6835 Body mass index (BMI) 35.0-35.9, adult: Secondary | ICD-10-CM | POA: Diagnosis not present

## 2017-12-21 DIAGNOSIS — R0982 Postnasal drip: Secondary | ICD-10-CM

## 2017-12-21 DIAGNOSIS — E786 Lipoprotein deficiency: Secondary | ICD-10-CM | POA: Diagnosis not present

## 2017-12-21 DIAGNOSIS — Z87898 Personal history of other specified conditions: Secondary | ICD-10-CM | POA: Diagnosis not present

## 2017-12-21 DIAGNOSIS — I151 Hypertension secondary to other renal disorders: Secondary | ICD-10-CM

## 2017-12-21 DIAGNOSIS — G894 Chronic pain syndrome: Secondary | ICD-10-CM

## 2017-12-21 DIAGNOSIS — N2889 Other specified disorders of kidney and ureter: Secondary | ICD-10-CM

## 2017-12-21 DIAGNOSIS — N183 Chronic kidney disease, stage 3 unspecified: Secondary | ICD-10-CM

## 2017-12-21 DIAGNOSIS — E6609 Other obesity due to excess calories: Secondary | ICD-10-CM | POA: Diagnosis not present

## 2017-12-21 MED ORDER — FLUTICASONE PROPIONATE 50 MCG/ACT NA SUSP: 2.0000 | Freq: Every day | NASAL | 6 refills | Status: DC

## 2017-12-21 NOTE — Patient Instructions (Signed)
Check out the information at familydoctor.org entitled "Nutrition for Weight Loss: What You Need to Know about Fad Diets" Try to lose between 1-2 pounds per week by taking in fewer calories and burning off more calories You can succeed by limiting portions, limiting foods dense in calories and fat, becoming more active, and drinking 8 glasses of water a day (64 ounces) Don't skip meals, especially breakfast, as skipping meals may alter your metabolism Do not use over-the-counter weight loss pills or gimmicks that claim rapid weight loss A healthy BMI (or body mass index) is between 18.5 and 24.9 You can calculate your ideal BMI at the NIH website http://www.nhlbi.nih.gov/health/educational/lose_wt/BMI/bmicalc.htm Try to limit saturated fats in your diet (bologna, hot dogs, barbeque, cheeseburgers, hamburgers, steak, bacon, sausage, cheese, etc.) and get more fresh fruits, vegetables, and whole grains  

## 2017-12-21 NOTE — Assessment & Plan Note (Signed)
Last two A1c readings have been normal; glad that patient is eating better; ongoing weight loss recommended

## 2017-12-21 NOTE — Assessment & Plan Note (Signed)
Managed by renal specialist; no NSAIDs

## 2017-12-21 NOTE — Assessment & Plan Note (Signed)
Managed by the pain clinic

## 2017-12-21 NOTE — Assessment & Plan Note (Signed)
We discussed factors that influence blood pressure; he is avoiding decongestants, NSAIDs, but has been on prednisone which could influence BP; he'll see nephrologist next week; weight loss encouraged

## 2017-12-21 NOTE — Progress Notes (Signed)
BP (!) 146/74   Pulse 95   Temp 98.2 F (36.8 C) (Oral)   Resp 14   Wt 240 lb 12.8 oz (109.2 kg)   SpO2 98%   BMI 35.05 kg/m    Subjective:    Patient ID: Nathan Ramos, male    DOB: 1971/04/19, 47 y.o.   MRN: [id number]  HPI: Nathan Ramos is a 47 y.o. male  Chief Complaint  Patient presents with  . Follow-up    HPI  Patient is here for f/u of multiple issues; he has long-standing hypertension Blood pressure was really up at the pain clinic; it was automatic cuff; rechecked and came down Not that stressed; nothing really salty; no decongestants He thinks the machine was wrong BP today better At the kidney doctor, not over 140; he goes there next week (medullary cystic kidney disease, HTN, gout) Went for disability test and it was high, 210 and it was stressful; that was the highest it has ever been; he was walking up an incline carrying a box; discontinued it Taking all the medicine for blood pressure We talked about prednisone and pain influencing BP  High TG and low HDL; no known fam hx of heart attack on maternal side Father died at age 66, but he does not know anything about his side of the family; they think he died of a stroke or having pneumonia Stuck at weight and actually gained; last TSH normal; drinking coffee, but it has sugar and creamer; one soda all day; skipped meals for 30 years  Prediabetes has resolved; eating better; gets whole grain bread, rarely eats hot dog buns; we reviewed the last four A1c readings  Postnasal drip; annoying cough, getting over cold, crud that spread through the household   Depression screen [facility] 2/9 12/21/2017 10/11/2017 06/29/2017 03/30/2017 01/01/2017  Decreased Interest 0 0 0 0 0  Down, Depressed, Hopeless 0 0 0 0 0  PHQ - 2 Score 0 0 0 0 0    Relevant past medical, surgical, family and social history reviewed Past Medical History:  Diagnosis Date  . Anemia    of chronic renal disease  . Bone spur of other site    right shoulder, managed by ortho  . Chronic pain    [name] CFNP at [facility]  . CKD (chronic kidney disease)    stage III 03/2014 (Dr. Mosetta Pigeon)  . Controlled substance agreement signed    signed 06/02/15  . Gout   . Gouty arthritis    knee, managed by Ortho  . Hyperlipidemia   . Hypertension   . IFG (impaired fasting glucose)   . Left knee DJD   . 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 Primary localized osteoarthritis of right knee   . Smoker    Past Surgical History:  Procedure Laterality Date  . CARPAL TUNNEL RELEASE Left   . CATARACT EXTRACTION W/ INTRAOCULAR LENS IMPLANT Left 2008  . CATARACT EXTRACTION W/ INTRAOCULAR LENS IMPLANT Right 2008  . EYE SURGERY    . HERNIA REPAIR    . JOINT REPLACEMENT Left 03/2014  . TOTAL KNEE ARTHROPLASTY Left 03/30/2014   Procedure: TOTAL KNEE ARTHROPLASTY;  Surgeon: Nilda Simmer, MD;  Location: [facility] OR;  Service: Orthopedics;  Laterality: Left;  . TOTAL KNEE ARTHROPLASTY Right 10/26/2014   Procedure: TOTAL KNEE ARTHROPLASTY;  Surgeon: Nilda Simmer, MD;  Location: [facility] OR;  Service: Orthopedics;  Laterality: Right;  .  UMBILICAL HERNIA REPAIR  2010   Family History  Problem Relation Age of Onset  . COPD Mother   . Kidney disease Mother   . Thyroid disease Mother   . Stroke Father 17  . COPD Maternal Grandmother    Social History   Tobacco Use  . Smoking status: Current Every Day Smoker    Packs/day: 0.50    Years: 22.00    Pack years: 11.00    Types: Cigarettes  . Smokeless tobacco: Never Used  Substance Use Topics  . Alcohol use: No  . Drug use: No    Interim medical history since last visit reviewed. Allergies and medications reviewed  Review of Systems Per HPI unless specifically indicated above     Objective:    BP (!) 146/74   Pulse 95   Temp 98.2 F (36.8 C) (Oral)   Resp 14   Wt 240 lb 12.8 oz (109.2 kg)   SpO2 98%   BMI 35.05 kg/m   Wt  Readings from Last 3 Encounters:  12/21/17 240 lb 12.8 oz (109.2 kg)  12/17/17 233 lb (105.7 kg)  12/06/17 233 lb (105.7 kg)    Physical Exam  Constitutional: He appears well-developed and well-nourished. No distress.  HENT:  Nose: Rhinorrhea (clear) present.  Mouth/Throat: Oropharynx is clear and moist and mucous membranes are normal. No posterior oropharyngeal edema or posterior oropharyngeal erythema.  Eyes: No scleral icterus.  Cardiovascular: Normal rate and regular rhythm.  Pulmonary/Chest: Effort normal and breath sounds normal.  Neurological: He is alert.  Skin: He is not diaphoretic. No pallor.  Psychiatric: He has a normal mood and affect. His mood appears not anxious. He does not exhibit a depressed mood.    Results for orders placed or performed in visit on 12/06/17  Compliance Drug Analysis, Ur  Result Value Ref Range   Summary FINAL       Assessment & Plan:   Problem List Items Addressed This Visit      Cardiovascular and Mediastinum   Hypertension - Primary    We discussed factors that influence blood pressure; he is avoiding decongestants, NSAIDs, but has been on prednisone which could influence BP; he'll see nephrologist next week; weight loss encouraged        Endocrine   Hx of impaired glucose tolerance    Last two A1c readings have been normal; glad that patient is eating better; ongoing weight loss recommended        Genitourinary   Chronic kidney disease, stage III (moderate) (HCC)    Managed by renal specialist; no NSAIDs        Other   Obesity    Encouraged patient to not skip breakfast; talked about having a little something, even if just a little cereal or protein bar to get metabolism going in the morning; drink adequate water, move more; healthy eating      Low HDL (under 40)    We don't know anything about his father's side of the family; discussed AimHigh study, lack of support for niacin for low HDL and outcomes; discussed risk of  heart attack, stroke with low HDL; encouraged smoking cessation and weight loss as measure he can take for raising the HDL      Chronic pain syndrome    Managed by the pain clinic       Other Visit Diagnoses    Postnasal drip       prescribed nasal corticosteroid which may help with the drip and subsequent  cough       Follow up plan: Return in about 4 months (around 04/22/2018) for twenty minute follow-up with fasting labs.  An after-visit summary was printed and given to the patient at check-out.  Please see the patient instructions which may contain other information and recommendations beyond what is mentioned above in the assessment and plan.  Meds ordered this encounter  Medications  . fluticasone (FLONASE) 50 MCG/ACT nasal spray    Sig: Place 2 sprays into both nostrils daily.    Dispense:  16 g    Refill:  6    No orders of the defined types were placed in this encounter.

## 2017-12-21 NOTE — Assessment & Plan Note (Signed)
Encouraged patient to not skip breakfast; talked about having a little something, even if just a little cereal or protein bar to get metabolism going in the morning; drink adequate water, move more; healthy eating

## 2017-12-21 NOTE — Assessment & Plan Note (Signed)
We don't know anything about his father's side of the family; discussed AimHigh study, lack of support for niacin for low HDL and outcomes; discussed risk of heart attack, stroke with low HDL; encouraged smoking cessation and weight loss as measure he can take for raising the HDL

## 2017-12-28 DIAGNOSIS — M1A00X0 Idiopathic chronic gout, unspecified site, without tophus (tophi): Secondary | ICD-10-CM | POA: Diagnosis not present

## 2017-12-28 DIAGNOSIS — I10 Essential (primary) hypertension: Secondary | ICD-10-CM | POA: Diagnosis not present

## 2017-12-28 DIAGNOSIS — N183 Chronic kidney disease, stage 3 (moderate): Secondary | ICD-10-CM | POA: Diagnosis not present

## 2018-01-17 ENCOUNTER — Encounter: Payer: Self-pay | Admitting: Student in an Organized Health Care Education/Training Program

## 2018-01-17 ENCOUNTER — Other Ambulatory Visit: Payer: Self-pay

## 2018-01-17 ENCOUNTER — Ambulatory Visit
Payer: Medicare Other | Attending: Student in an Organized Health Care Education/Training Program | Admitting: Student in an Organized Health Care Education/Training Program

## 2018-01-17 VITALS — BP 151/95 | HR 74 | Temp 98.3°F | Resp 16 | Ht 69.5 in | Wt 240.0 lb

## 2018-01-17 DIAGNOSIS — Q615 Medullary cystic kidney: Secondary | ICD-10-CM | POA: Diagnosis not present

## 2018-01-17 DIAGNOSIS — Z961 Presence of intraocular lens: Secondary | ICD-10-CM | POA: Insufficient documentation

## 2018-01-17 DIAGNOSIS — Z9842 Cataract extraction status, left eye: Secondary | ICD-10-CM | POA: Diagnosis not present

## 2018-01-17 DIAGNOSIS — Z79899 Other long term (current) drug therapy: Secondary | ICD-10-CM | POA: Diagnosis not present

## 2018-01-17 DIAGNOSIS — E669 Obesity, unspecified: Secondary | ICD-10-CM | POA: Diagnosis not present

## 2018-01-17 DIAGNOSIS — Z825 Family history of asthma and other chronic lower respiratory diseases: Secondary | ICD-10-CM | POA: Insufficient documentation

## 2018-01-17 DIAGNOSIS — M1A39X1 Chronic gout due to renal impairment, multiple sites, with tophus (tophi): Secondary | ICD-10-CM | POA: Insufficient documentation

## 2018-01-17 DIAGNOSIS — N183 Chronic kidney disease, stage 3 unspecified: Secondary | ICD-10-CM

## 2018-01-17 DIAGNOSIS — Z8349 Family history of other endocrine, nutritional and metabolic diseases: Secondary | ICD-10-CM | POA: Diagnosis not present

## 2018-01-17 DIAGNOSIS — I129 Hypertensive chronic kidney disease with stage 1 through stage 4 chronic kidney disease, or unspecified chronic kidney disease: Secondary | ICD-10-CM | POA: Diagnosis not present

## 2018-01-17 DIAGNOSIS — M170 Bilateral primary osteoarthritis of knee: Secondary | ICD-10-CM

## 2018-01-17 DIAGNOSIS — Z823 Family history of stroke: Secondary | ICD-10-CM | POA: Diagnosis not present

## 2018-01-17 DIAGNOSIS — Z6834 Body mass index (BMI) 34.0-34.9, adult: Secondary | ICD-10-CM | POA: Insufficient documentation

## 2018-01-17 DIAGNOSIS — E559 Vitamin D deficiency, unspecified: Secondary | ICD-10-CM | POA: Insufficient documentation

## 2018-01-17 DIAGNOSIS — Z886 Allergy status to analgesic agent status: Secondary | ICD-10-CM | POA: Insufficient documentation

## 2018-01-17 DIAGNOSIS — M25551 Pain in right hip: Secondary | ICD-10-CM | POA: Diagnosis present

## 2018-01-17 DIAGNOSIS — G894 Chronic pain syndrome: Secondary | ICD-10-CM | POA: Diagnosis not present

## 2018-01-17 DIAGNOSIS — Z96653 Presence of artificial knee joint, bilateral: Secondary | ICD-10-CM | POA: Insufficient documentation

## 2018-01-17 DIAGNOSIS — F17210 Nicotine dependence, cigarettes, uncomplicated: Secondary | ICD-10-CM | POA: Diagnosis not present

## 2018-01-17 DIAGNOSIS — Z9841 Cataract extraction status, right eye: Secondary | ICD-10-CM | POA: Diagnosis not present

## 2018-01-17 DIAGNOSIS — Z79891 Long term (current) use of opiate analgesic: Secondary | ICD-10-CM | POA: Diagnosis not present

## 2018-01-17 DIAGNOSIS — E785 Hyperlipidemia, unspecified: Secondary | ICD-10-CM | POA: Diagnosis not present

## 2018-01-17 DIAGNOSIS — Z7951 Long term (current) use of inhaled steroids: Secondary | ICD-10-CM | POA: Diagnosis not present

## 2018-01-17 DIAGNOSIS — Z841 Family history of disorders of kidney and ureter: Secondary | ICD-10-CM | POA: Insufficient documentation

## 2018-01-17 MED ORDER — OXYCODONE-ACETAMINOPHEN 10-325 MG PO TABS: 1.0000 | ORAL_TABLET | Freq: Three times a day (TID) | ORAL | 0 refills | Status: DC | PRN

## 2018-01-17 MED ORDER — MORPHINE SULFATE ER 15 MG PO TBCR: 15.0000 mg | EXTENDED_RELEASE_TABLET | Freq: Every day | ORAL | 0 refills | Status: DC

## 2018-01-17 MED ORDER — OXYCODONE-ACETAMINOPHEN 10-325 MG PO TABS
1.0000 | ORAL_TABLET | Freq: Three times a day (TID) | ORAL | 0 refills | Status: DC | PRN
Start: 2018-01-17 — End: 2018-01-17

## 2018-01-17 NOTE — Progress Notes (Signed)
Patient's Name: Nathan Ramos  MRN: [id number]  Referring Provider: Arnetha Courser, MD  DOB: 1971-04-26  PCP: Arnetha Courser, MD  DOS: 01/17/2018  Note by: Gillis Santa, MD  Service setting: Ambulatory outpatient  Specialty: Interventional Pain Management  Location: [facility] (AMB) Pain Management Facility    Patient type: Established   Primary Reason(s) for Visit: Encounter for prescription drug management. (Level of risk: moderate)  CC: Hip Pain (bilateral) and Joint Pain (hands)  HPI  Mr. Irby is a 47 y.o. year old, male patient, who comes today for a medication management evaluation. He has Medullary cystic disease of the kidney; Left knee DJD; Chronic pain; Hypertension; Gout due to renal impairment; DJD (degenerative joint disease) of knee; Primary localized osteoarthritis of right knee; Chronic kidney disease, stage III (moderate) ([place]); Anemia; Hypertriglyceridemia; Hx of impaired glucose tolerance; Gouty arthritis; Controlled substance agreement signed; Wrist pain, right; Medication monitoring encounter; Trigger ring finger of right hand; Obesity; Tobacco abuse; Chronic pain syndrome; Vitamin D deficiency; Opioid use; and Low HDL (under 40) on their problem list. His primarily concern today is the Hip Pain (bilateral) and Joint Pain (hands)  Pain Assessment: Location: Left, Right Hip Radiating: hips down back of legs to knee Onset: Today(bilateral knee replacements) Duration: Acute pain Quality: Aching, Pressure Severity: 6 /10 (self-reported pain score)  Note: Reported level is inconsistent with clinical observations.                         When using our objective Pain Scale, levels between 6 and 10/10 are said to belong in an emergency room, as it progressively worsens from a 6/10, described as severely limiting, requiring emergency care not usually available at an outpatient pain management facility. At a 6/10 level, communication becomes difficult and requires great effort. Assistance  to reach the emergency department may be required. Facial flushing and profuse sweating along with potentially dangerous increases in heart rate and blood pressure will be evident. Effect on ADL: walking, activty Timing: Intermittent Modifying factors: unsure, new pain started today with hips and legs, states no pain in back  Mr. Yusko was last scheduled for an appointment on 12/17/2017 for medication management. During today's appointment we reviewed Mr. Mesa chronic pain status, as well as his outpatient medication regimen.  The patient  reports that he does not use drugs. His body mass index is 45.93 kg/m.  Further details on both, my assessment(s), as well as the proposed treatment plan, please see below.  Controlled Substance Pharmacotherapy Assessment REMS (Risk Evaluation and Mitigation Strategy)  Analgesic: morphine 7.5 mg twice daily as needed (MME equals 15), Percocet at current dose of 10 mg 3 times daily as needed, quantity 90 (MME equals 45) MME= 60 mg/day Ignatius Specking, RN  45/05/2018 10:30 AM  Sign at close encounter Nursing Pain Medication Assessment:  Safety precautions to be maintained throughout the outpatient stay will include: orient to surroundings, keep bed in low position, maintain call bell within reach at all times, provide assistance with transfer out of bed and ambulation.  Medication Inspection Compliance: Pill count conducted under aseptic conditions, in front of the patient. Neither the pills nor the bottle was removed from the patient's sight at any time. Once count was completed pills were immediately returned to the patient in their original bottle.  Medication #1: Oxycodone/APAP Pill/Patch Count: 26 of 90 pills remain Pill/Patch Appearance: Markings consistent with prescribed medication Bottle Appearance: Standard pharmacy container. Clearly labeled. Filled  Date: 2 / 85 / 2019 Last Medication intake:  Today  Medication #2: Morphine ER  (MSContin) Pill/Patch Count: 10 of 30 pills remain Pill/Patch Appearance: Markings consistent with prescribed medication Bottle Appearance: Standard pharmacy container. Clearly labeled. Filled Date: 2 / 15 / 2019 Last Medication intake:  Today   Pharmacokinetics: Liberation and absorption (onset of action): WNL Distribution (time to peak effect): WNL Metabolism and excretion (duration of action): WNL         Pharmacodynamics: Desired effects: Analgesia: Mr. Narine reports >50% benefit. Functional ability: Patient reports that medication allows him to accomplish basic ADLs Clinically meaningful improvement in function (CMIF): Sustained CMIF goals met Perceived effectiveness: Described as relatively effective, allowing for increase in activities of daily living (ADL) Undesirable effects: Side-effects or Adverse reactions: None reported Monitoring: [place] PMP: Online review of the past 39-monthperiod conducted. Compliant with practice rules and regulations Last UDS on record: Summary  Date Value Ref Range Status  12/06/2017 FINAL  Final    Comment:    ==================================================================== TOXASSURE COMP DRUG ANALYSIS,UR ==================================================================== Test                             Result       Flag       Units Drug Present and Declared for Prescription Verification   Morphine                       9409         EXPECTED   ng/mg creat   Normorphine                    218          EXPECTED   ng/mg creat    Potential sources of large amounts of morphine in the absence of    codeine include administration of morphine or use of heroin.    Normorphine is an expected metabolite of morphine.   Oxycodone                      1325         EXPECTED   ng/mg creat   Oxymorphone                    1877         EXPECTED   ng/mg creat   Noroxycodone                   677          EXPECTED   ng/mg creat   Noroxymorphone                  243          EXPECTED   ng/mg creat    Sources of oxycodone are scheduled prescription medications.    Oxymorphone, noroxycodone, and noroxymorphone are expected    metabolites of oxycodone. Oxymorphone is also available as a    scheduled prescription medication.   Acetaminophen                  PRESENT      EXPECTED ==================================================================== Test                      Result    Flag   Units      Ref Range   Creatinine  44               mg/dL      >=20 ==================================================================== Declared Medications:  The flagging and interpretation on this report are based on the  following declared medications.  Unexpected results may arise from  inaccuracies in the declared medications.  **Note: The testing scope of this panel includes these medications:  Morphine (Morphine Sulfate)  Oxycodone (Oxycodone Acetaminophen)  **Note: The testing scope of this panel does not include small to  moderate amounts of these reported medications:  Acetaminophen (Oxycodone Acetaminophen)  **Note: The testing scope of this panel does not include following  reported medications:  Amlodipine  Atorvastatin  Febuxostat  Furosemide  Losartan (Losartan Potassium)  Naloxone  Prednisone  Vitamin D2 (Ergocalciferol) ==================================================================== For clinical consultation, please call [phone]. ====================================================================    UDS interpretation: Compliant          Medication Assessment Form: Reviewed. Patient indicates being compliant with therapy Treatment compliance: Compliant Risk Assessment Profile: Aberrant behavior: See prior evaluations. None observed or detected today Comorbid factors increasing risk of overdose: See prior notes. No additional risks detected today Risk of substance use disorder (SUD): Low Opioid Risk Tool -  01/17/18 1028      Family History of Substance Abuse   Alcohol  Negative    Illegal Drugs  Negative    Rx Drugs  Negative      Personal History of Substance Abuse   Alcohol  Negative    Illegal Drugs  Negative    Rx Drugs  Negative      Psychological Disease   Psychological Disease  Negative    Depression  Negative      Total Score   Opioid Risk Tool Scoring  0    Opioid Risk Interpretation  Low Risk      ORT Scoring interpretation table:  Score <3 = Low Risk for SUD  Score between 4-7 = Moderate Risk for SUD  Score >8 = High Risk for Opioid Abuse   Risk Mitigation Strategies:  Patient Counseling: Covered Patient-Prescriber Agreement (PPA): Present and active  Notification to other healthcare providers: Done  Pharmacologic Plan: No change in therapy, at this time.             Laboratory Chemistry  Inflammation Markers (CRP: Acute Phase) (ESR: Chronic Phase) No results found for: CRP, ESRSEDRATE, LATICACIDVEN                       Rheumatology Markers Lab Results  Component Value Date   LABURIC 6.2 10/16/2017                Renal Function Markers Lab Results  Component Value Date   BUN 50 (H) 10/16/2017   CREATININE 2.22 (H) 10/16/2017   GFRAA 40 (L) 10/16/2017   GFRNONAA 35 (L) 10/16/2017                 Hepatic Function Markers Lab Results  Component Value Date   AST 19 10/16/2017   ALT 16 10/16/2017   ALBUMIN 4.6 10/16/2017   ALKPHOS 95 10/16/2017                 Electrolytes Lab Results  Component Value Date   NA 136 10/16/2017   K 5.2 10/16/2017   CL 101 10/16/2017   CALCIUM 9.4 10/16/2017  Neuropathy Markers Lab Results  Component Value Date   HGBA1C 5.4 07/06/2017                 Bone Pathology Markers No results found for: VD25OH, SV779TJ0ZES, PQ3300TM2, UQ3335KT6, 25OHVITD1, 25OHVITD2, 25OHVITD3, TESTOFREE, TESTOSTERONE                       Coagulation Parameters Lab Results  Component Value Date   INR  1.02 10/16/2014   LABPROT 13.5 10/16/2014   APTT 37 10/16/2014   PLT 241 06/05/2016                 Cardiovascular Markers Lab Results  Component Value Date   HGB 11.3 (L) 06/05/2016   HCT 33.6 (L) 06/05/2016                 CA Markers No results found for: CEA, CA125, LABCA2               Note: Lab results reviewed.    Meds   Current Outpatient Medications:  .  amLODipine (NORVASC) 10 MG tablet, Take 1 tablet (10 mg total) by mouth daily., Disp: 90 tablet, Rfl: 1 .  atorvastatin (LIPITOR) 20 MG tablet, TAKE 1 TABLET(20 MG) BY MOUTH AT BEDTIME, Disp: 90 tablet, Rfl: 3 .  Febuxostat (ULORIC) 80 MG TABS, Take 1 tablet by mouth every other day. , Disp: , Rfl:  .  fluticasone (FLONASE) 50 MCG/ACT nasal spray, Place 2 sprays into both nostrils daily., Disp: 16 g, Rfl: 6 .  furosemide (LASIX) 40 MG tablet, Take 1 tablet (40 mg total) by mouth every other day., Disp: 45 tablet, Rfl: 1 .  losartan (COZAAR) 50 MG tablet, TAKE 1 TABLET BY MOUTH EVERY DAY, Disp: 90 tablet, Rfl: 0 .  morphine (MS CONTIN) 15 MG 12 hr tablet, Take 1 tablet (15 mg total) by mouth daily. For chronic pain -To fill on or after 01/27/18, 02/26/18 -ok to fill one day early if pharmacy closed, Disp: 30 tablet, Rfl: 0 .  naloxone (NARCAN) nasal spray 4 mg/0.1 mL, Use in case of accidental overdose with narcotics / opioids, Disp: 1 kit, Rfl: 1 .  oxyCODONE-acetaminophen (PERCOCET) 10-325 MG tablet, Take 1 tablet by mouth every 8 (eight) hours as needed for pain (max three pills per 24 hours). For chronic pain -To fill on or after 01/27/18, 02/26/18 -ok to fill one day early if pharmacy closed, Disp: 90 tablet, Rfl: 0 .  predniSONE (DELTASONE) 10 MG tablet, Take 5 pills by mouth today, then 4 on day 2, 3 on day 3, 2 on day 4, and 1 on day 5, Disp: 15 tablet, Rfl: 1 .  Vitamin D, Ergocalciferol, (DRISDOL) 50000 units CAPS capsule, Take 50,000 Units by mouth once a week. , Disp: , Rfl: 0  ROS  Constitutional: Denies any fever  or chills Gastrointestinal: No reported hemesis, hematochezia, vomiting, or acute GI distress Musculoskeletal: Denies any acute onset joint swelling, redness, loss of ROM, or weakness Neurological: No reported episodes of acute onset apraxia, aphasia, dysarthria, agnosia, amnesia, paralysis, loss of coordination, or loss of consciousness  Allergies  Mr. Pavon is allergic to nsaids.  [place]  Drug: Mr. Vanvranken  reports that he does not use drugs. Alcohol:  reports that he does not drink alcohol. Tobacco:  reports that he has been smoking cigarettes.  He has a 11.00 pack-year smoking history. he has never used smokeless tobacco. Medical:  has a past medical history of  Anemia, Bone spur of other site, Chronic pain, CKD (chronic kidney disease), Controlled substance agreement signed, Gout, Gouty arthritis, Hyperlipidemia, Hypertension, IFG (impaired fasting glucose), Left knee DJD, Medullary cystic disease of the kidney, Primary localized osteoarthritis of right knee, and Smoker. Surgical: Mr. Kean  has a past surgical history that includes Carpal tunnel release (Left); Umbilical hernia repair (2010); Cataract extraction w/ intraocular lens implant (Left, 2008); Cataract extraction w/ intraocular lens implant (Right, 2008); Hernia repair; Total knee arthroplasty (Left, 03/30/2014); Eye surgery; Total knee arthroplasty (Right, 10/26/2014); and Joint replacement (Left, 03/2014). Family: family history includes COPD in his maternal grandmother and mother; Kidney disease in his mother; Stroke (age of onset: 8) in his father; Thyroid disease in his mother.  Constitutional Exam  General appearance: Well nourished, well developed, and well hydrated. In no apparent acute distress Vitals:   01/17/18 1018  BP: (!) 151/95  Pulse: 74  Resp: 16  Temp: 98.3 F (36.8 C)  SpO2: 100%  Weight: 240 lb (108.9 kg)  Height: 5' 9.5" (1.765 m)   BMI Assessment: Estimated body mass index is 34.93 kg/m as calculated from  the following:   Height as of this encounter: 5' 9.5" (1.765 m).   Weight as of this encounter: 240 lb (108.9 kg).  BMI interpretation table: BMI level Category Range association with higher incidence of chronic pain  <18 kg/m2 Underweight   18.5-24.9 kg/m2 Ideal body weight   25-29.9 kg/m2 Overweight Increased incidence by 20%  30-34.9 kg/m2 Obese (Class I) Increased incidence by 68%  35-39.9 kg/m2 Severe obesity (Class II) Increased incidence by 136%  >40 kg/m2 Extreme obesity (Class III) Increased incidence by 254%   BMI Readings from Last 4 Encounters:  01/17/18 34.93 kg/m  12/21/17 35.05 kg/m  12/17/17 33.91 kg/m  12/06/17 34.41 kg/m   Wt Readings from Last 4 Encounters:  01/17/18 240 lb (108.9 kg)  12/21/17 240 lb 12.8 oz (109.2 kg)  12/17/17 233 lb (105.7 kg)  12/06/17 233 lb (105.7 kg)  Psych/Mental status: Alert, oriented x 3 (person, place, & time)       Eyes: PERLA Respiratory: No evidence of acute respiratory distress  Cervical Spine Area Exam  Skin & Axial Inspection: No masses, redness, edema, swelling, or associated skin lesions Alignment: Symmetrical Functional ROM: Unrestricted ROM      Stability: No instability detected Muscle Tone/Strength: Functionally intact. No obvious neuro-muscular anomalies detected. Sensory (Neurological): Unimpaired Palpation: No palpable anomalies              Upper Extremity (UE) Exam    Side: Right upper extremity  Side: Left upper extremity  Skin & Extremity Inspection: Skin color, temperature, and hair growth are WNL. No peripheral edema or cyanosis. No masses, redness, swelling, asymmetry, or associated skin lesions. No contractures.  Skin & Extremity Inspection: Skin color, temperature, and hair growth are WNL. No peripheral edema or cyanosis. No masses, redness, swelling, asymmetry, or associated skin lesions. No contractures.  Functional ROM: Unrestricted ROM          Functional ROM: Unrestricted ROM          Muscle  Tone/Strength: Functionally intact. No obvious neuro-muscular anomalies detected.  Muscle Tone/Strength: Functionally intact. No obvious neuro-muscular anomalies detected.  Sensory (Neurological): Unimpaired          Sensory (Neurological): Unimpaired          Palpation: No palpable anomalies              Palpation: No palpable anomalies  Specialized Test(s): Deferred         Specialized Test(s): Deferred          Thoracic Spine Area Exam  Skin & Axial Inspection: No masses, redness, or swelling Alignment: Symmetrical Functional ROM: Unrestricted ROM Stability: No instability detected Muscle Tone/Strength: Functionally intact. No obvious neuro-muscular anomalies detected. Sensory (Neurological): Unimpaired Muscle strength & Tone: No palpable anomalies  Lumbar Spine Area Exam  Skin & Axial Inspection: No masses, redness, or swelling Alignment: Symmetrical Functional ROM: Unrestricted ROM      Stability: No instability detected Muscle Tone/Strength: Functionally intact. No obvious neuro-muscular anomalies detected. Sensory (Neurological): Unimpaired Palpation: No palpable anomalies       Provocative Tests: Lumbar Hyperextension and rotation test: evaluation deferred today       Lumbar Lateral bending test: evaluation deferred today       Patrick's Maneuver: evaluation deferred today                    Gait & Posture Assessment  Ambulation: Unassisted Gait: Relatively normal for age and body habitus Posture: WNL   Lower Extremity Exam    Side: Right lower extremity  Side: Left lower extremity  Skin & Extremity Inspection: Skin color, temperature, and hair growth are WNL. No peripheral edema or cyanosis. No masses, redness, swelling, asymmetry, or associated skin lesions. No contractures.  Skin & Extremity Inspection: Skin color, temperature, and hair growth are WNL. No peripheral edema or cyanosis. No masses, redness, swelling, asymmetry, or associated skin lesions. No  contractures.  Functional ROM: Unrestricted ROM          Functional ROM: Unrestricted ROM          Muscle Tone/Strength: Functionally intact. No obvious neuro-muscular anomalies detected.  Muscle Tone/Strength: Functionally intact. No obvious neuro-muscular anomalies detected.  Sensory (Neurological): Unimpaired  Sensory (Neurological): Unimpaired  Palpation: No palpable anomalies  Palpation: No palpable anomalies   Assessment  Primary Diagnosis & Pertinent Problem List: The primary encounter diagnosis was Chronic pain syndrome. 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  Status Diagnosis  Controlled Controlled Controlled 1. Chronic pain syndrome   2. 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 History of bilateral knee replacement   4. Primary osteoarthritis of both knees   5. Medullary cystic disease of the kidney   6. 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 Patient has been on chronic opioid therapy for the last 13 years. I have had patient returns today for follow-up.  As his urine drug screen was reviewed and is appropriate.  Extensive discussion with the patient about my concerns regarding opioid intake and its risk especially at his age. Patient has been compliant with his medications as well as his urine drug screens. There is been no  aberrant behavior noted. Furthermore, patient's gout is very debilitating and has resulted in advanced degenerative disease he is status post bilateral knee replacements. Patient has  6 children and works part-time. He denies any alcohol or substance abuse. Patient has seen pain psychology and is deemed low risk for substance abuse disorder.   Here for medication refill as below.  [place] PMP checked and appropriate.  UDS up-to-date and appropriate.  Patient compliant with therapy.  Plan: -Prescription for morphine 15 mg daily, quantity 30.  Okay for patient to take 7.5 mg twice daily.  -Prescription for Percocet 10 mg 3 times daily as needed, quantity 90. -Patient unable to tolerate NSAID therapy given his medullary cystic kidney disease -Patient has tried various membrane stabilizers including gabapentin in the past which resulted in sedation and inability to work. Patient is also tried various muscle relaxants which resulted in sedation is not interested in trying these medications.  Plan of Care  Pharmacotherapy (Medications Ordered): Meds ordered this encounter  Medications  . DISCONTD: oxyCODONE-acetaminophen (PERCOCET) 10-325 MG tablet    Sig: Take 1 tablet by mouth every 8 (eight) hours as needed for pain (max three pills per 24 hours). For chronic pain -To fill on or after 01/27/18, 02/26/18 -ok to fill one day early if pharmacy closed    Dispense:  90 tablet    Refill:  0  . DISCONTD: morphine (MS CONTIN) 15 MG 12 hr tablet    Sig: Take 1 tablet (15 mg total) by mouth daily. For chronic pain -To fill on or after 01/27/18, 02/26/18 -ok to fill one day early if pharmacy closed    Dispense:  30 tablet    Refill:  0  . oxyCODONE-acetaminophen (PERCOCET) 10-325 MG tablet    Sig: Take 1 tablet by mouth every 8 (eight) hours as needed for pain (max three pills per 24 hours). For chronic pain -To fill on or after 01/27/18, 02/26/18 -ok to fill one day early if pharmacy closed    Dispense:  90  tablet    Refill:  0  . morphine (MS CONTIN) 15 MG 12 hr tablet    Sig: Take 1 tablet (15 mg total) by mouth daily. For chronic pain -To fill on or after 01/27/18, 02/26/18 -ok to fill one day early if pharmacy closed    Dispense:  30 tablet    Refill:  0   Provider-requested follow-up: Return in about 8 weeks (around 03/14/2018) for Medication Management. Time Note: Greater than 50% of the 25 minute(s) of face-to-face time spent with Mr. Ilic, was spent in counseling/coordination of care regarding: the appropriate use of the pain scale, opioid tolerance, Mr. Radoncic primary cause of pain, the opioid analgesic risks and possible complications, the appropriate use of his medications, realistic expectations, the goals of pain management (increased in functionality) and the medication agreement. Future Appointments  Date Time Provider [place]  03/14/2018 10:30 AM Gillis Santa, MD [facility] None  04/23/2018 11:00 AM Lada, Satira Anis, MD [place] [facility]    Primary Care Physician: Arnetha Courser, MD Location: [facility] Out[facility] Pain Management Facility Note by: Gillis Santa, M.D Date: 01/17/2018; Time: 1:55 PM  There are no Patient Instructions on file for this visit.

## 2018-01-17 NOTE — Progress Notes (Signed)
Nursing Pain Medication Assessment:  Safety precautions to be maintained throughout the outpatient stay will include: orient to surroundings, keep bed in low position, maintain call bell within reach at all times, provide assistance with transfer out of bed and ambulation.  Medication Inspection Compliance: Pill count conducted under aseptic conditions, in front of the patient. Neither the pills nor the bottle was removed from the patient's sight at any time. Once count was completed pills were immediately returned to the patient in their original bottle.  Medication #1: Oxycodone/APAP Pill/Patch Count: 26 of 90 pills remain Pill/Patch Appearance: Markings consistent with prescribed medication Bottle Appearance: Standard pharmacy container. Clearly labeled. Filled Date: 2 / 15 / 2019 Last Medication intake:  Today  Medication #2: Morphine ER (MSContin) Pill/Patch Count: 10 of 30 pills remain Pill/Patch Appearance: Markings consistent with prescribed medication Bottle Appearance: Standard pharmacy container. Clearly labeled. Filled Date: 2 / 15 / 2019 Last Medication intake:  Today

## 2018-02-11 DIAGNOSIS — H26492 Other secondary cataract, left eye: Secondary | ICD-10-CM | POA: Diagnosis not present

## 2018-03-14 ENCOUNTER — Encounter: Payer: Self-pay | Admitting: Student in an Organized Health Care Education/Training Program

## 2018-03-14 ENCOUNTER — Ambulatory Visit
Payer: Medicare Other | Attending: Student in an Organized Health Care Education/Training Program | Admitting: Student in an Organized Health Care Education/Training Program

## 2018-03-14 ENCOUNTER — Other Ambulatory Visit: Payer: Self-pay

## 2018-03-14 VITALS — BP 139/92 | HR 83 | Temp 98.2°F | Resp 16 | Ht 70.5 in | Wt 240.0 lb

## 2018-03-14 DIAGNOSIS — M79672 Pain in left foot: Secondary | ICD-10-CM | POA: Diagnosis not present

## 2018-03-14 DIAGNOSIS — Z79899 Other long term (current) drug therapy: Secondary | ICD-10-CM | POA: Insufficient documentation

## 2018-03-14 DIAGNOSIS — I129 Hypertensive chronic kidney disease with stage 1 through stage 4 chronic kidney disease, or unspecified chronic kidney disease: Secondary | ICD-10-CM | POA: Insufficient documentation

## 2018-03-14 DIAGNOSIS — Z9889 Other specified postprocedural states: Secondary | ICD-10-CM | POA: Insufficient documentation

## 2018-03-14 DIAGNOSIS — M170 Bilateral primary osteoarthritis of knee: Secondary | ICD-10-CM

## 2018-03-14 DIAGNOSIS — Z5181 Encounter for therapeutic drug level monitoring: Secondary | ICD-10-CM | POA: Diagnosis not present

## 2018-03-14 DIAGNOSIS — M25512 Pain in left shoulder: Secondary | ICD-10-CM | POA: Insufficient documentation

## 2018-03-14 DIAGNOSIS — Q615 Medullary cystic kidney: Secondary | ICD-10-CM | POA: Diagnosis not present

## 2018-03-14 DIAGNOSIS — G894 Chronic pain syndrome: Secondary | ICD-10-CM | POA: Diagnosis not present

## 2018-03-14 DIAGNOSIS — N183 Chronic kidney disease, stage 3 unspecified: Secondary | ICD-10-CM

## 2018-03-14 DIAGNOSIS — M1A39X1 Chronic gout due to renal impairment, multiple sites, with tophus (tophi): Secondary | ICD-10-CM | POA: Diagnosis not present

## 2018-03-14 DIAGNOSIS — M1039 Gout due to renal impairment, multiple sites: Secondary | ICD-10-CM | POA: Insufficient documentation

## 2018-03-14 DIAGNOSIS — M79671 Pain in right foot: Secondary | ICD-10-CM | POA: Insufficient documentation

## 2018-03-14 DIAGNOSIS — Z79891 Long term (current) use of opiate analgesic: Secondary | ICD-10-CM | POA: Insufficient documentation

## 2018-03-14 DIAGNOSIS — M25511 Pain in right shoulder: Secondary | ICD-10-CM | POA: Insufficient documentation

## 2018-03-14 DIAGNOSIS — Z96653 Presence of artificial knee joint, bilateral: Secondary | ICD-10-CM | POA: Diagnosis not present

## 2018-03-14 DIAGNOSIS — M79641 Pain in right hand: Secondary | ICD-10-CM | POA: Diagnosis not present

## 2018-03-14 DIAGNOSIS — F17210 Nicotine dependence, cigarettes, uncomplicated: Secondary | ICD-10-CM | POA: Diagnosis not present

## 2018-03-14 MED ORDER — OXYCODONE-ACETAMINOPHEN 10-325 MG PO TABS: 1.0000 | ORAL_TABLET | Freq: Three times a day (TID) | ORAL | 0 refills | Status: DC | PRN

## 2018-03-14 MED ORDER — MORPHINE SULFATE ER 15 MG PO TBCR: 15.0000 mg | EXTENDED_RELEASE_TABLET | Freq: Every day | ORAL | 0 refills | Status: DC

## 2018-03-14 NOTE — Patient Instructions (Signed)
You have been given 3 Rx for Percocet and 3 Rx for oxycontin to last until 06/24/2018.

## 2018-03-14 NOTE — Progress Notes (Signed)
Nursing Pain Medication Assessment:  Safety precautions to be maintained throughout the outpatient stay will include: orient to surroundings, keep bed in low position, maintain call bell within reach at all times, provide assistance with transfer out of bed and ambulation.  Medication Inspection Compliance: Pill count conducted under aseptic conditions, in front of the patient. Neither the pills nor the bottle was removed from the patient's sight at any time. Once count was completed pills were immediately returned to the patient in their original bottle.  Medication #1: Morphine ER (MSContin) Pill/Patch Count: 12 of 30 pills remain Pill/Patch Appearance: Markings consistent with prescribed medication Bottle Appearance: Standard pharmacy container. Clearly labeled. Filled Date: 4 / 18 / 2019 Last Medication intake:  Today  Medication #2: Hydrocodone/APAP Pill/Patch Count: 12 of 90 pills remain Pill/Patch Appearance: Markings consistent with prescribed medication Bottle Appearance: Standard pharmacy container. Clearly labeled. Filled Date: 4 / 56 / 2019 Last Medication intake:  Today

## 2018-03-14 NOTE — Progress Notes (Signed)
Patient's Name: Nathan Ramos  MRN: [id number]  Referring Provider: Arnetha Courser, MD  DOB: 08/14/1971  PCP: Arnetha Courser, MD  DOS: 03/14/2018  Note by: Gillis Santa, MD  Service setting: Ambulatory outpatient  Specialty: Interventional Pain Management  Location: [facility] (AMB) Pain Management Facility    Patient type: Established   Primary Reason(s) for Visit: Encounter for prescription drug management. (Level of risk: moderate)  CC: Shoulder Pain (right); Hand Pain (bilateral); and Foot Pain (bilateral, right worse)  HPI  Nathan Ramos is a 47 y.o. year old, male patient, who comes today for a medication management evaluation. He has Medullary cystic disease of the kidney; Left knee DJD; Chronic pain; Hypertension; Gout due to renal impairment; DJD (degenerative joint disease) of knee; Primary localized osteoarthritis of right knee; Chronic kidney disease, stage III (moderate) ([place]); Anemia; Hypertriglyceridemia; Hx of impaired glucose tolerance; Gouty arthritis; Controlled substance agreement signed; Wrist pain, right; Medication monitoring encounter; Trigger ring finger of right hand; Obesity; Tobacco abuse; Chronic pain syndrome; Vitamin D deficiency; Opioid use; and Low HDL (under 40) on their problem list. His primarily concern today is the Shoulder Pain (right); Hand Pain (bilateral); and Foot Pain (bilateral, right worse)  Pain Assessment: Location: Right Shoulder Radiating: radiates up to neck Onset: More than a month ago Duration: Chronic pain Quality: Aching("Stuck") Severity: 6 /10 (self-reported pain score)  Note: Reported level is compatible with observation.                         When using our objective Pain Scale, levels between 6 and 10/10 are said to belong in an emergency room, as it progressively worsens from a 6/10, described as severely limiting, requiring emergency care not usually available at an outpatient pain management facility. At a 6/10 level, communication becomes  difficult and requires great effort. Assistance to reach the emergency department may be required. Facial flushing and profuse sweating along with potentially dangerous increases in heart rate and blood pressure will be evident. Effect on ADL: sleeping, lifting things, decreased in lifting anything heavy Timing: Constant Modifying factors: "nothing"  Nathan Ramos was last scheduled for an appointment on 01/17/2018 for medication management. During today's appointment we reviewed Nathan Ramos chronic pain status, as well as his outpatient medication regimen.  The patient  reports that he does not use drugs. His body mass index is 33.95 kg/m.  Further details on both, my assessment(s), as well as the proposed treatment plan, please see below.  Controlled Substance Pharmacotherapy Assessment REMS (Risk Evaluation and Mitigation Strategy)  Analgesic:morphine 7.5 mg twice daily as needed (MME equals 15), Percocet at current dose of 10 mg 3 times daily as needed, quantity 90 (MME equals 45) MME= 60 mg/day  Ignatius Specking, RN  03/14/2018 10:39 AM  Sign at close encounter Nursing Pain Medication Assessment:  Safety precautions to be maintained throughout the outpatient stay will include: orient to surroundings, keep bed in low position, maintain call bell within reach at all times, provide assistance with transfer out of bed and ambulation.  Medication Inspection Compliance: Pill count conducted under aseptic conditions, in front of the patient. Neither the pills nor the bottle was removed from the patient's sight at any time. Once count was completed pills were immediately returned to the patient in their original bottle.  Medication #1: Morphine ER (MSContin) Pill/Patch Count: 12 of 30 pills remain Pill/Patch Appearance: Markings consistent with prescribed medication Bottle Appearance: Standard pharmacy container. Clearly labeled.  Filled Date: 4 / 65 / 2019 Last Medication intake:   Today  Medication #2: Hydrocodone/APAP Pill/Patch Count: 12 of 90 pills remain Pill/Patch Appearance: Markings consistent with prescribed medication Bottle Appearance: Standard pharmacy container. Clearly labeled. Filled Date: 4 / 92 / 2019 Last Medication intake:  Today   Pharmacokinetics: Liberation and absorption (onset of action): WNL Distribution (time to peak effect): WNL Metabolism and excretion (duration of action): WNL         Pharmacodynamics: Desired effects: Analgesia: Nathan Ramos reports >50% benefit. Functional ability: Patient reports that medication allows him to accomplish basic ADLs Clinically meaningful improvement in function (CMIF): Sustained CMIF goals met Perceived effectiveness: Described as relatively effective, allowing for increase in activities of daily living (ADL) Undesirable effects: Side-effects or Adverse reactions: None reported Monitoring: [place] PMP: Online review of the past 35-monthperiod conducted. Compliant with practice rules and regulations Last UDS on record: Summary  Date Value Ref Range Status  12/06/2017 FINAL  Final    Comment:    ==================================================================== TOXASSURE COMP DRUG ANALYSIS,UR ==================================================================== Test                             Result       Flag       Units Drug Present and Declared for Prescription Verification   Morphine                       9409         EXPECTED   ng/mg creat   Normorphine                    218          EXPECTED   ng/mg creat    Potential sources of large amounts of morphine in the absence of    codeine include administration of morphine or use of heroin.    Normorphine is an expected metabolite of morphine.   Oxycodone                      1325         EXPECTED   ng/mg creat   Oxymorphone                    1877         EXPECTED   ng/mg creat   Noroxycodone                   677          EXPECTED   ng/mg  creat   Noroxymorphone                 243          EXPECTED   ng/mg creat    Sources of oxycodone are scheduled prescription medications.    Oxymorphone, noroxycodone, and noroxymorphone are expected    metabolites of oxycodone. Oxymorphone is also available as a    scheduled prescription medication.   Acetaminophen                  PRESENT      EXPECTED ==================================================================== Test                      Result    Flag   Units      Ref Range   Creatinine  44               mg/dL      >=20 ==================================================================== Declared Medications:  The flagging and interpretation on this report are based on the  following declared medications.  Unexpected results may arise from  inaccuracies in the declared medications.  **Note: The testing scope of this panel includes these medications:  Morphine (Morphine Sulfate)  Oxycodone (Oxycodone Acetaminophen)  **Note: The testing scope of this panel does not include small to  moderate amounts of these reported medications:  Acetaminophen (Oxycodone Acetaminophen)  **Note: The testing scope of this panel does not include following  reported medications:  Amlodipine  Atorvastatin  Febuxostat  Furosemide  Losartan (Losartan Potassium)  Naloxone  Prednisone  Vitamin D2 (Ergocalciferol) ==================================================================== For clinical consultation, please call [phone]. ====================================================================    UDS interpretation: Compliant          Medication Assessment Form: Reviewed. Patient indicates being compliant with therapy Treatment compliance: Compliant Risk Assessment Profile: Aberrant behavior: See prior evaluations. None observed or detected today Comorbid factors increasing risk of overdose: See prior notes. No additional risks detected today Risk of substance use  disorder (SUD): Low Opioid Risk Tool - 03/14/18 1034      Family History of Substance Abuse   Alcohol  Negative    Illegal Drugs  Negative    Rx Drugs  Negative      Personal History of Substance Abuse   Alcohol  Negative    Illegal Drugs  Negative    Rx Drugs  Negative      Total Score   Opioid Risk Tool Scoring  0    Opioid Risk Interpretation  Low Risk      ORT Scoring interpretation table:  Score <3 = Low Risk for SUD  Score between 4-7 = Moderate Risk for SUD  Score >8 = High Risk for Opioid Abuse   Risk Mitigation Strategies:  Patient Counseling: Covered Patient-Prescriber Agreement (PPA): Present and active  Notification to other healthcare providers: Done  Pharmacologic Plan: No change in therapy, at this time.             Laboratory Chemistry  Inflammation Markers (CRP: Acute Phase) (ESR: Chronic Phase) No results found for: CRP, ESRSEDRATE, LATICACIDVEN                       Rheumatology Markers Lab Results  Component Value Date   LABURIC 6.2 10/16/2017                        Renal Function Markers Lab Results  Component Value Date   BUN 50 (H) 10/16/2017   CREATININE 2.22 (H) 10/16/2017   GFRAA 40 (L) 10/16/2017   GFRNONAA 35 (L) 10/16/2017                              Hepatic Function Markers Lab Results  Component Value Date   AST 19 10/16/2017   ALT 16 10/16/2017   ALBUMIN 4.6 10/16/2017   ALKPHOS 95 10/16/2017                        Electrolytes Lab Results  Component Value Date   NA 136 10/16/2017   K 5.2 10/16/2017   CL 101 10/16/2017   CALCIUM 9.4 10/16/2017  Neuropathy Markers Lab Results  Component Value Date   HGBA1C 5.4 07/06/2017                        Bone Pathology Markers No results found for: VD25OH, OJ500XF8HWE, XH3716RC7, EL3810FB5, 25OHVITD1, 25OHVITD2, 25OHVITD3, TESTOFREE, TESTOSTERONE                       Coagulation Parameters Lab Results  Component Value Date   INR 1.02 10/16/2014    LABPROT 13.5 10/16/2014   APTT 37 10/16/2014   PLT 241 06/05/2016                        Cardiovascular Markers Lab Results  Component Value Date   HGB 11.3 (L) 06/05/2016   HCT 33.6 (L) 06/05/2016                         CA Markers No results found for: CEA, CA125, LABCA2                      Note: Lab results reviewed.   Meds   Current Outpatient Medications:  .  amLODipine (NORVASC) 10 MG tablet, Take 1 tablet (10 mg total) by mouth daily., Disp: 90 tablet, Rfl: 1 .  atorvastatin (LIPITOR) 20 MG tablet, TAKE 1 TABLET(20 MG) BY MOUTH AT BEDTIME, Disp: 90 tablet, Rfl: 3 .  Febuxostat (ULORIC) 80 MG TABS, Take 1 tablet by mouth every other day. , Disp: , Rfl:  .  furosemide (LASIX) 40 MG tablet, Take 1 tablet (40 mg total) by mouth every other day., Disp: 45 tablet, Rfl: 1 .  losartan (COZAAR) 50 MG tablet, TAKE 1 TABLET BY MOUTH EVERY DAY, Disp: 90 tablet, Rfl: 0 .  morphine (MS CONTIN) 15 MG 12 hr tablet, Take 1 tablet (15 mg total) by mouth daily. For chronic pain -To fill on or after 03-27-2018, 04-26-2018, 05-25-2018 -ok to fill one day early if pharmacy closed, Disp: 30 tablet, Rfl: 0 .  naloxone (NARCAN) nasal spray 4 mg/0.1 mL, Use in case of accidental overdose with narcotics / opioids, Disp: 1 kit, Rfl: 1 .  oxyCODONE-acetaminophen (PERCOCET) 10-325 MG tablet, Take 1 tablet by mouth every 8 (eight) hours as needed for pain (max three pills per 24 hours). For chronic pain -To fill on or after 03-27-2018, 04-26-2018, 05-25-2018 -ok to fill one day early if pharmacy closed, Disp: 90 tablet, Rfl: 0 .  predniSONE (DELTASONE) 10 MG tablet, Take 5 pills by mouth today, then 4 on day 2, 3 on day 3, 2 on day 4, and 1 on day 5, Disp: 15 tablet, Rfl: 1 .  fluticasone (FLONASE) 50 MCG/ACT nasal spray, Place 2 sprays into both nostrils daily. (Patient not taking: Reported on 03/14/2018), Disp: 16 g, Rfl: 6 .  Vitamin D, Ergocalciferol, (DRISDOL) 50000 units CAPS capsule, Take 50,000 Units by  mouth once a week. , Disp: , Rfl: 0  ROS  Constitutional: Denies any fever or chills Gastrointestinal: No reported hemesis, hematochezia, vomiting, or acute GI distress Musculoskeletal: Denies any acute onset joint swelling, redness, loss of ROM, or weakness Neurological: No reported episodes of acute onset apraxia, aphasia, dysarthria, agnosia, amnesia, paralysis, loss of coordination, or loss of consciousness  Allergies  Nathan Ramos is allergic to nsaids.  [place]  Drug: Nathan Ramos  reports that he does not use drugs. Alcohol:  reports that  he does not drink alcohol. Tobacco:  reports that he has been smoking cigarettes.  He has a 11.00 pack-year smoking history. He has never used smokeless tobacco. Medical:  has a past medical history of Anemia, Bone spur of other site, Chronic pain, CKD (chronic kidney disease), Controlled substance agreement signed, Gout, Gouty arthritis, History of YAG laser capsulotomy of lens of left eye, Hyperlipidemia, Hypertension, IFG (impaired fasting glucose), Left knee DJD, Medullary cystic disease of the kidney, Primary localized osteoarthritis of right knee, and Smoker. Surgical: Nathan Ramos  has a past surgical history that includes Carpal tunnel release (Left); Umbilical hernia repair (2010); Cataract extraction w/ intraocular lens implant (Left, 2008); Cataract extraction w/ intraocular lens implant (Right, 2008); Hernia repair; Total knee arthroplasty (Left, 03/30/2014); Eye surgery; Total knee arthroplasty (Right, 10/26/2014); and Joint replacement (Left, 03/2014). Family: family history includes COPD in his maternal grandmother and mother; Kidney disease in his mother; Stroke (age of onset: 63) in his father; Thyroid disease in his mother.  Constitutional Exam  General appearance: Well nourished, well developed, and well hydrated. In no apparent acute distress Vitals:   03/14/18 1025  BP: (!) 139/92  Pulse: 83  Resp: 16  Temp: 98.2 F (36.8 C)  SpO2: 99%   Weight: 240 lb (108.9 kg)  Height: 5' 10.5" (1.791 m)   BMI Assessment: Estimated body mass index is 33.95 kg/m as calculated from the following:   Height as of this encounter: 5' 10.5" (1.791 m).   Weight as of this encounter: 240 lb (108.9 kg).  BMI interpretation table: BMI level Category Range association with higher incidence of chronic pain  <18 kg/m2 Underweight   18.5-24.9 kg/m2 Ideal body weight   25-29.9 kg/m2 Overweight Increased incidence by 20%  30-34.9 kg/m2 Obese (Class I) Increased incidence by 68%  35-39.9 kg/m2 Severe obesity (Class II) Increased incidence by 136%  >40 kg/m2 Extreme obesity (Class III) Increased incidence by 254%   Patient's current BMI Ideal Body weight  Body mass index is 33.95 kg/m. Ideal body weight: 74.1 kg (163 lb 7.5 oz) Adjusted ideal body weight: 88 kg (194 lb 1.3 oz)   BMI Readings from Last 4 Encounters:  03/14/18 33.95 kg/m  01/17/18 34.93 kg/m  12/21/17 35.05 kg/m  12/17/17 33.91 kg/m   Wt Readings from Last 4 Encounters:  03/14/18 240 lb (108.9 kg)  01/17/18 240 lb (108.9 kg)  12/21/17 240 lb 12.8 oz (109.2 kg)  12/17/17 233 lb (105.7 kg)  Psych/Mental status: Alert, oriented x 3 (person, place, & time)       Eyes: PERLA Respiratory: No evidence of acute respiratory distress  Cervical Spine Area Exam  Skin & Axial Inspection: No masses, redness, edema, swelling, or associated skin lesions Alignment: Symmetrical Functional ROM: Unrestricted ROM      Stability: No instability detected Muscle Tone/Strength: Functionally intact. No obvious neuro-muscular anomalies detected. Sensory (Neurological): Unimpaired Palpation: No palpable anomalies              Upper Extremity (UE) Exam    Side: Right upper extremity  Side: Left upper extremity  Skin & Extremity Inspection: Skin color, temperature, and hair growth are WNL. No peripheral edema or cyanosis. No masses, redness, swelling, asymmetry, or associated skin lesions. No  contractures.  Skin & Extremity Inspection: Skin color, temperature, and hair growth are WNL. No peripheral edema or cyanosis. No masses, redness, swelling, asymmetry, or associated skin lesions. No contractures.  Functional ROM: Unrestricted ROM  Functional ROM: Unrestricted ROM          Muscle Tone/Strength: Functionally intact. No obvious neuro-muscular anomalies detected.  Muscle Tone/Strength: Functionally intact. No obvious neuro-muscular anomalies detected.  Sensory (Neurological): Unimpaired          Sensory (Neurological): Unimpaired          Palpation: No palpable anomalies              Palpation: No palpable anomalies              Specialized Test(s): Deferred         Specialized Test(s): Deferred          Thoracic Spine Area Exam  Skin & Axial Inspection: No masses, redness, or swelling Alignment: Symmetrical Functional ROM: Unrestricted ROM Stability: No instability detected Muscle Tone/Strength: Functionally intact. No obvious neuro-muscular anomalies detected. Sensory (Neurological): Unimpaired Muscle strength & Tone: No palpable anomalies  Lumbar Spine Area Exam  Skin & Axial Inspection: No masses, redness, or swelling Alignment: Symmetrical Functional ROM: Unrestricted ROM       Stability: No instability detected Muscle Tone/Strength: Functionally intact. No obvious neuro-muscular anomalies detected. Sensory (Neurological): Unimpaired Palpation: No palpable anomalies       Provocative Tests: Lumbar Hyperextension and rotation test: evaluation deferred today       Lumbar Lateral bending test: evaluation deferred today       Patrick's Maneuver: evaluation deferred today                    Gait & Posture Assessment  Ambulation: Unassisted Gait: Relatively normal for age and body habitus Posture: WNL   Lower Extremity Exam    Side: Right lower extremity  Side: Left lower extremity  Skin & Extremity Inspection: Skin color, temperature, and hair growth are WNL.  No peripheral edema or cyanosis. No masses, redness, swelling, asymmetry, or associated skin lesions. No contractures.  Skin & Extremity Inspection: Skin color, temperature, and hair growth are WNL. No peripheral edema or cyanosis. No masses, redness, swelling, asymmetry, or associated skin lesions. No contractures.  Functional ROM: Unrestricted ROM          Functional ROM: Unrestricted ROM          Muscle Tone/Strength: Functionally intact. No obvious neuro-muscular anomalies detected.  Muscle Tone/Strength: Functionally intact. No obvious neuro-muscular anomalies detected.  Sensory (Neurological): Unimpaired  Sensory (Neurological): Unimpaired  Palpation: No palpable anomalies  Palpation: No palpable anomalies   Assessment  Primary Diagnosis & Pertinent Problem List: The primary encounter diagnosis was Chronic pain syndrome. 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  Status Diagnosis  Controlled Controlled Controlled 1. Chronic pain syndrome   2. 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 History of bilateral knee replacement   4. Primary osteoarthritis of both knees   5. Medullary cystic disease of the kidney   6. 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 Patient has been on  chronic opioid therapy for the last 13 years.   Patient has been  compliant with his medications as well as his urine drug screens. There is been no aberrant behavior noted. Furthermore, patient's gout is very debilitating and has resulted in advanced degenerative disease he is status post bilateral knee replacements. Patient has 6 children and works part-time. He denies any alcohol or substance abuse.Patienthas seenpain psychology and is deemed low risk for substance abuse disorder.   Here for medication refill as below.  [place] PMP checked and appropriate.  UDS up-to-date and appropriate.  Patient compliant with therapy.  Plan: -Prescription for morphine 15 mg daily, quantity 30. Okay for patient to take 7.5 mg twice daily.  -Prescription for Percocet 10 mg 3 times daily as needed, quantity 90. -Patient unable to tolerate NSAID therapy given his medullary cystic kidney disease -Patient has tried various membrane stabilizers including gabapentin in the past which resulted in sedation and inability to work. Patient is also tried various muscle relaxants which resulted in sedation is not interested in trying these medications.    Plan of Care  Pharmacotherapy (Medications Ordered): Meds ordered this encounter  Medications  . DISCONTD: oxyCODONE-acetaminophen (PERCOCET) 10-325 MG tablet    Sig: Take 1 tablet by mouth every 8 (eight) hours as needed for pain (max three pills per 24 hours). For chronic pain -To fill on or after 03-27-2018, 04-26-2018, 05-25-2018 -ok to fill one day early if pharmacy closed    Dispense:  90 tablet    Refill:  0  . DISCONTD: morphine (MS CONTIN) 15 MG 12 hr tablet    Sig: Take 1 tablet (15 mg total) by mouth daily. For chronic pain -To fill on or after 03-27-2018, 04-26-2018, 05-25-2018 -ok to fill one day early if pharmacy closed    Dispense:  30 tablet    Refill:  0  . DISCONTD: oxyCODONE-acetaminophen (PERCOCET) 10-325 MG tablet    Sig: Take 1 tablet by mouth every 8 (eight) hours as needed for pain (max three pills per  24 hours). For chronic pain -To fill on or after 03-27-2018, 04-26-2018, 05-25-2018 -ok to fill one day early if pharmacy closed    Dispense:  90 tablet    Refill:  0  . DISCONTD: morphine (MS CONTIN) 15 MG 12 hr tablet    Sig: Take 1 tablet (15 mg total) by mouth daily. For chronic pain -To fill on or after 03-27-2018, 04-26-2018, 05-25-2018 -ok to fill one day early if pharmacy closed    Dispense:  30 tablet    Refill:  0  . morphine (MS CONTIN) 15 MG 12 hr tablet    Sig: Take 1 tablet (15 mg total) by mouth daily. For chronic pain -To fill on or after 03-27-2018, 04-26-2018, 05-25-2018 -ok to fill one day early if pharmacy closed    Dispense:  30 tablet    Refill:  0  . oxyCODONE-acetaminophen (PERCOCET) 10-325 MG tablet    Sig: Take 1 tablet by mouth every 8 (eight) hours as needed for pain (max three pills per 24 hours). For chronic pain -To fill on or after 03-27-2018, 04-26-2018, 05-25-2018 -ok to fill one day early if pharmacy closed    Dispense:  90 tablet    Refill:  0   Provider-requested follow-up: Return in about 3 months (around 06/14/2018) for Medication Management.  Future Appointments  Date Time Provider [place]  04/23/2018 11:00 AM Arnetha Courser, MD [facility]  06/18/2018 10:00 AM Gillis Santa, MD  [facility] None    Primary Care Physician: Arnetha Courser, MD Location: [facility] Note by: Gillis Santa, M.D Date: 03/14/2018; Time: 12:48 PM  Patient Instructions  You have been given 3 Rx for Percocet and 3 Rx for oxycontin to last until 06/24/2018.

## 2018-03-22 DIAGNOSIS — J020 Streptococcal pharyngitis: Secondary | ICD-10-CM | POA: Diagnosis not present

## 2018-04-09 ENCOUNTER — Other Ambulatory Visit: Payer: Self-pay | Admitting: Family Medicine

## 2018-04-10 MED ORDER — PREDNISONE 10 MG PO TABS: ORAL_TABLET | ORAL | 0 refills | Status: DC

## 2018-04-10 NOTE — Addendum Note (Signed)
Addended by: Davene Costain on: 04/10/2018 03:36 PM   Modules accepted: Orders

## 2018-04-10 NOTE — Telephone Encounter (Signed)
Pt calling back checking on refill request. He states he needs it for gout flare ups.

## 2018-04-10 NOTE — Addendum Note (Signed)
Addended by: Kahne Helfand, Janit Bern on: 04/10/2018 04:15 PM   Modules accepted: Orders

## 2018-04-23 ENCOUNTER — Encounter: Payer: Self-pay | Admitting: Family Medicine

## 2018-04-23 ENCOUNTER — Ambulatory Visit (INDEPENDENT_AMBULATORY_CARE_PROVIDER_SITE_OTHER): Payer: Medicare Other | Admitting: Family Medicine

## 2018-04-23 VITALS — BP 134/76 | HR 94 | Temp 98.0°F | Resp 14 | Ht 71.0 in | Wt 247.5 lb

## 2018-04-23 DIAGNOSIS — E786 Lipoprotein deficiency: Secondary | ICD-10-CM | POA: Diagnosis not present

## 2018-04-23 DIAGNOSIS — N183 Chronic kidney disease, stage 3 unspecified: Secondary | ICD-10-CM

## 2018-04-23 DIAGNOSIS — Z5181 Encounter for therapeutic drug level monitoring: Secondary | ICD-10-CM

## 2018-04-23 DIAGNOSIS — N2889 Other specified disorders of kidney and ureter: Secondary | ICD-10-CM

## 2018-04-23 DIAGNOSIS — Z87898 Personal history of other specified conditions: Secondary | ICD-10-CM | POA: Diagnosis not present

## 2018-04-23 DIAGNOSIS — Q615 Medullary cystic kidney: Secondary | ICD-10-CM

## 2018-04-23 DIAGNOSIS — E781 Pure hyperglyceridemia: Secondary | ICD-10-CM | POA: Diagnosis not present

## 2018-04-23 DIAGNOSIS — I151 Hypertension secondary to other renal disorders: Secondary | ICD-10-CM | POA: Diagnosis not present

## 2018-04-23 DIAGNOSIS — E559 Vitamin D deficiency, unspecified: Secondary | ICD-10-CM

## 2018-04-23 DIAGNOSIS — M10372 Gout due to renal impairment, left ankle and foot: Secondary | ICD-10-CM

## 2018-04-23 NOTE — Assessment & Plan Note (Signed)
Reviewed the last four A1c readings; last two have been completely normal; will follow glucose levels unless rising or symptomatic

## 2018-04-23 NOTE — Assessment & Plan Note (Signed)
Managed by nephrologist; I spoke with his specialist by phone while the patient was here about gout management, given his complex kidney issue

## 2018-04-23 NOTE — Assessment & Plan Note (Signed)
Weight loss and walking would help

## 2018-04-23 NOTE — Assessment & Plan Note (Signed)
Continue current medicine ?

## 2018-04-23 NOTE — Assessment & Plan Note (Signed)
Check TG today; encouraged healthy eating

## 2018-04-23 NOTE — Assessment & Plan Note (Signed)
Monitored and managed by nephrologist

## 2018-04-23 NOTE — Patient Instructions (Addendum)
Check out the information at familydoctor.org entitled "Nutrition for Weight Loss: What You Need to Know about Fad Diets" Try to lose between 1-2 pounds per week by taking in fewer calories and burning off more calories You can succeed by limiting portions, limiting foods dense in calories and fat, becoming more active, and drinking 8 glasses of water a day (64 ounces) Don't skip meals, especially breakfast, as skipping meals may alter your metabolism Do not use over-the-counter weight loss pills or gimmicks that claim rapid weight loss A healthy BMI (or body mass index) is between 18.5 and 24.9 You can calculate your ideal BMI at the NIH website [employer].huhttp://www.nhlbi.nih.gov/health/educational/lose_wt/BMI/bmicalc.htm  Repeat the prednisone taper Low-Purine Diet Purines are compounds that affect the level of uric acid in your body. A low-purine diet is a diet that is low in purines. Eating a low-purine diet can prevent the level of uric acid in your body from getting too high and causing gout or kidney stones or both. What do I need to know about this diet?  Choose low-purine foods. Examples of low-purine foods are listed in the next section.  Drink plenty of fluids, especially water. Fluids can help remove uric acid from your body. Try to drink 8-16 cups (1.9-3.8 L) a day.  Limit foods high in fat, especially saturated fat, as fat makes it harder for the body to get rid of uric acid. Foods high in saturated fat include pizza, cheese, ice cream, whole milk, fried foods, and gravies. Choose foods that are lower in fat and lean sources of protein. Use olive oil when cooking as it contains healthy fats that are not high in saturated fat.  Limit alcohol. Alcohol interferes with the elimination of uric acid from your body. If you are having a gout attack, avoid all alcohol.  Keep in mind that different people's bodies react differently to different foods. You will probably learn over time which foods do  or do not affect you. If you discover that a food tends to cause your gout to flare up, avoid eating that food. You can more freely enjoy foods that do not cause problems. If you have any questions about a food item, talk to your dietitian or health care provider. Which foods are low, moderate, and high in purines? The following is a list of foods that are low, moderate, and high in purines. You can eat any amount of the foods that are low in purines. You may be able to have small amounts of foods that are moderate in purines. Ask your health care provider how much of a food moderate in purines you can have. Avoid foods high in purines. Grains  Foods low in purines: Enriched white bread, pasta, rice, cake, cornbread, popcorn.  Foods moderate in purines: Whole-grain breads and cereals, wheat germ, bran, oatmeal. Uncooked oatmeal. Dry wheat bran or wheat germ.  Foods high in purines: Pancakes, [place] toast, biscuits, muffins. Vegetables  Foods low in purines: All vegetables, except those that are moderate in purines.  Foods moderate in purines: Asparagus, cauliflower, spinach, mushrooms, green peas. Fruits  All fruits are low in purines. Meats and other Protein Foods  Foods low in purines: Eggs, nuts, peanut butter.  Foods moderate in purines: 80-90% lean beef, lamb, veal, pork, poultry, fish, eggs, peanut butter, nuts. Crab, lobster, oysters, and shrimp. Cooked dried beans, peas, and lentils.  Foods high in purines: Anchovies, sardines, herring, mussels, tuna, codfish, scallops, trout, and haddock. Nathan BlaseBacon. Organ meats (such as liver or kidney).  [place]. Game meat. Goose. Sweetbreads. Dairy  All dairy foods are low in purines. Low-fat and fat-free dairy products are best because they are low in saturated fat. Beverages  Drinks low in purines: Water, carbonated beverages, tea, coffee, cocoa.  Drinks moderate in purines: Soft drinks and other drinks sweetened with high-fructose corn syrup.  Juices. To find whether a food or drink is sweetened with high-fructose corn syrup, look at the ingredients list.  Drinks high in purines: Alcoholic beverages (such as beer). Condiments  Foods low in purines: Salt, herbs, olives, pickles, relishes, vinegar.  Foods moderate in purines: Butter, margarine, oils, mayonnaise. Fats and Oils  Foods low in purines: All types, except gravies and sauces made with meat.  Foods high in purines: Gravies and sauces made with meat. Other Foods  Foods low in purines: Sugars, sweets, gelatin. Cake. Soups made without meat.  Foods moderate in purines: Meat-based or fish-based soups, broths, or bouillons. Foods and drinks sweetened with high-fructose corn syrup.  Foods high in purines: High-fat desserts (such as ice cream, cookies, cakes, pies, doughnuts, and chocolate). Contact your dietitian for more information on foods that are not listed here. This information is not intended to replace advice given to you by your health care provider. Make sure you discuss any questions you have with your health care provider. Document Released: 02/24/2011 Document Revised: 04/06/2016 Document Reviewed: 10/06/2013 Elsevier Interactive Patient Education  2017 [employer]. Food Choices to Lower Your Triglycerides Triglycerides are a type of fat in your blood. High levels of triglycerides can increase the risk of heart disease and stroke. If your triglyceride levels are high, the foods you eat and your eating habits are very important. Choosing the right foods can help lower your triglycerides. What general guidelines do I need to follow?  Lose weight if you are overweight.  Limit or avoid alcohol.  Fill one half of your plate with vegetables and green salads.  Limit fruit to two servings a day. Choose fruit instead of juice.  Make one fourth of your plate whole grains. Look for the word "whole" as the first word in the ingredient list.  Fill one fourth of  your plate with lean protein foods.  Enjoy fatty fish (such as salmon, mackerel, sardines, and tuna) three times a week.  Choose healthy fats.  Limit foods high in starch and sugar.  Eat more home-cooked food and less restaurant, buffet, and fast food.  Limit fried foods.  Cook foods using methods other than frying.  Limit saturated fats.  Check ingredient lists to avoid foods with partially hydrogenated oils (trans fats) in them. What foods can I eat? Grains Whole grains, such as whole wheat or whole grain breads, crackers, cereals, and pasta. Unsweetened oatmeal, bulgur, barley, quinoa, or brown rice. Corn or whole wheat flour tortillas. Vegetables Fresh or frozen vegetables (raw, steamed, roasted, or grilled). Green salads. Fruits All fresh, canned (in natural juice), or frozen fruits. Meat and Other Protein Products Ground beef (85% or leaner), grass-fed beef, or beef trimmed of fat. Skinless chicken or [place]. Ground chicken or [place]. Pork trimmed of fat. All fish and seafood. Eggs. Dried beans, peas, or lentils. Unsalted nuts or seeds. Unsalted canned or dry beans. Dairy Low-fat dairy products, such as skim or 1% milk, 2% or reduced-fat cheeses, low-fat ricotta or cottage cheese, or plain low-fat yogurt. Fats and Oils Tub margarines without trans fats. Light or reduced-fat mayonnaise and salad dressings. Avocado. Safflower, olive, or canola oils. Natural peanut or almond  butter. The items listed above may not be a complete list of recommended foods or beverages. Contact your dietitian for more options. What foods are not recommended? Grains White bread. White pasta. White rice. Cornbread. Bagels, pastries, and croissants. Crackers that contain trans fat. Vegetables White potatoes. Corn. Creamed or fried vegetables. Vegetables in a cheese sauce. Fruits Dried fruits. Canned fruit in light or heavy syrup. Fruit juice. Meat and Other Protein Products Fatty cuts of meat.  Ribs, chicken wings, bacon, sausage, bologna, salami, chitterlings, fatback, hot dogs, bratwurst, and packaged luncheon meats. Dairy Whole or 2% milk, cream, half-and-half, and cream cheese. Whole-fat or sweetened yogurt. Full-fat cheeses. Nondairy creamers and whipped toppings. Processed cheese, cheese spreads, or cheese curds. Sweets and Desserts Corn syrup, sugars, honey, and molasses. Candy. Jam and jelly. Syrup. Sweetened cereals. Cookies, pies, cakes, donuts, muffins, and ice cream. Fats and Oils Butter, stick margarine, lard, shortening, ghee, or bacon fat. Coconut, palm kernel, or palm oils. Beverages Alcohol. Sweetened drinks (such as sodas, lemonade, and fruit drinks or punches). The items listed above may not be a complete list of foods and beverages to avoid. Contact your dietitian for more information. This information is not intended to replace advice given to you by your health care provider. Make sure you discuss any questions you have with your health care provider. Document Released: 08/17/2004 Document Revised: 04/06/2016 Document Reviewed: 09/03/2013 Elsevier Interactive Patient Education  2017 Elsevier Inc.  Obesity, Adult Obesity is the condition of having too much total body fat. Being overweight or obese means that your weight is greater than what is considered healthy for your body size. Obesity is determined by a measurement called BMI. BMI is an estimate of body fat and is calculated from height and weight. For adults, a BMI of 30 or higher is considered obese. Obesity can eventually lead to other health concerns and major illnesses, including:  Stroke.  Coronary artery disease (CAD).  Type 2 diabetes.  Some types of cancer, including cancers of the colon, breast, uterus, and gallbladder.  Osteoarthritis.  High blood pressure (hypertension).  High cholesterol.  Sleep apnea.  Gallbladder stones.  Infertility problems.  What are the causes? The main  cause of obesity is taking in (consuming) more calories than your body uses for energy. Other factors that contribute to this condition may include:  Being born with genes that make you more likely to become obese.  Having a medical condition that causes obesity. These conditions include: ? Hypothyroidism. ? Polycystic ovarian syndrome (PCOS). ? Binge-eating disorder. ? Cushing syndrome.  Taking certain medicines, such as steroids, antidepressants, and seizure medicines.  Not being physically active (sedentary lifestyle).  Living where there are limited places to exercise safely or buy healthy foods.  Not getting enough sleep.  What increases the risk? The following factors may increase your risk of this condition:  Having a family history of obesity.  Being a woman of African-American descent.  Being a man of Hispanic descent.  What are the signs or symptoms? Having excessive body fat is the main symptom of this condition. How is this diagnosed? This condition may be diagnosed based on:  Your symptoms.  Your medical history.  A physical exam. Your health care provider may measure: ? Your BMI. If you are an adult with a BMI between 25 and less than 30, you are considered overweight. If you are an adult with a BMI of 30 or higher, you are considered obese. ? The distances around your hips and  your waist (circumferences). These may be compared to each other to help diagnose your condition. ? Your skinfold thickness. Your health care provider may gently pinch a fold of your skin and measure it.  How is this treated? Treatment for this condition often includes changing your lifestyle. Treatment may include some or all of the following:  Dietary changes. Work with your health care provider and a dietitian to set a weight-loss goal that is healthy and reasonable for you. Dietary changes may include eating: ? Smaller portions. A portion size is the amount of a particular food  that is healthy for you to eat at one time. This varies from person to person. ? Low-calorie or low-fat options. ? More whole grains, fruits, and vegetables.  Regular physical activity. This may include aerobic activity (cardio) and strength training.  Medicine to help you lose weight. Your health care provider may prescribe medicine if you are unable to lose 1 pound a week after 6 weeks of eating more healthily and doing more physical activity.  Surgery. Surgical options may include gastric banding and gastric bypass. Surgery may be done if: ? Other treatments have not helped to improve your condition. ? You have a BMI of 40 or higher. ? You have life-threatening health problems related to obesity.  Follow these instructions at home:  Eating and drinking   Follow recommendations from your health care provider about what you eat and drink. Your health care provider may advise you to: ? Limit fast foods, sweets, and processed snack foods. ? Choose low-fat options, such as low-fat milk instead of whole milk. ? Eat 5 or more servings of fruits or vegetables every day. ? Eat at home more often. This gives you more control over what you eat. ? Choose healthy foods when you eat out. ? Learn what a healthy portion size is. ? Keep low-fat snacks on hand. ? Avoid sugary drinks, such as soda, fruit juice, iced tea sweetened with sugar, and flavored milk. ? Eat a healthy breakfast.  Drink enough water to keep your urine clear or pale yellow.  Do not go without eating for long periods of time (do not fast) or follow a fad diet. Fasting and fad diets can be unhealthy and even dangerous. Physical Activity  Exercise regularly, as told by your health care provider. Ask your health care provider what types of exercise are safe for you and how often you should exercise.  Warm up and stretch before being active.  Cool down and stretch after being active.  Rest between periods of  activity. Lifestyle  Limit the time that you spend in front of your TV, computer, or video game system.  Find ways to reward yourself that do not involve food.  Limit alcohol intake to no more than 1 drink a day for nonpregnant women and 2 drinks a day for men. One drink equals 12 oz of beer, 5 oz of wine, or 1 oz of hard liquor. General instructions  Keep a weight loss journal to keep track of the food you eat and how much you exercise you get.  Take over-the-counter and prescription medicines only as told by your health care provider.  Take vitamins and supplements only as told by your health care provider.  Consider joining a support group. Your health care provider may be able to recommend a support group.  Keep all follow-up visits as told by your health care provider. This is important. Contact a health care provider if:  You  are unable to meet your weight loss goal after 6 weeks of dietary and lifestyle changes. This information is not intended to replace advice given to you by your health care provider. Make sure you discuss any questions you have with your health care provider. Document Released: 12/07/2004 Document Revised: 04/03/2016 Document Reviewed: 08/18/2015 Elsevier Interactive Patient Education  2018 [employer].

## 2018-04-23 NOTE — Progress Notes (Signed)
BP 134/76   Pulse 94   Temp 98 F (36.7 C) (Oral)   Resp 14   Ht 5\' 11"  (1.803 m)   Wt 247 lb 8 oz (112.3 kg)   SpO2 97%   BMI 34.52 kg/m    Subjective:    Patient ID: Nathan Ramos, male    DOB: 1970-11-16, 47 y.o.   MRN: [id number]  HPI: Nathan Ramos is a 47 y.o. male  Chief Complaint  Patient presents with  . Follow-up    HPI Patient is here for f/u He has a current gout flare; going on for two weeks now; lateral aspect of the LEFT foot; tender and red  Has used the prednisone taper just recently; has one day left He has not used colchicine for years; when he would have severe gout flares He gets these occasionally but not sure why the recent flare; no alcohol; no changes in diet He has tried tart cherrry, but not much luck I reviewed the nephrology note from February He has been on colchicine, indomethacin; had flares with allopurinol He does want to go back to rheum Did prednisone, but weight ballooned  He is seeing the pain clinic for chronic pain issues related to arthropathy from gout; he cannot take NSAIDs  HTN; controlled on CCB and ARB; he does try to stay away from salt  High cholesterol, low HDL and high TG; avoids fried foods; so-so with starches  Lab Results  Component Value Date   CHOL 139 10/16/2017   CHOL 219 (H) 07/06/2017   CHOL 154 01/01/2017   Lab Results  Component Value Date   HDL 23 (L) 10/16/2017   HDL 30 (L) 07/06/2017   HDL 19 (L) 01/01/2017   Lab Results  Component Value Date   LDLCALC 40 10/16/2017   LDLCALC 116 (H) 07/06/2017   LDLCALC NOT CALC 01/01/2017   Lab Results  Component Value Date   TRIG 382 (H) 10/16/2017   TRIG 364 (H) 07/06/2017   TRIG 465 (H) 01/01/2017   Lab Results  Component Value Date   CHOLHDL 6.0 (H) 10/16/2017   CHOLHDL 7.3 (H) 07/06/2017   CHOLHDL 8.1 (H) 01/01/2017   No results found for: LDLDIRECT   Depression screen [facility] 2/9 04/23/2018 03/14/2018 01/17/2018 12/21/2017 10/11/2017  Decreased  Interest 0 0 0 0 0  Down, Depressed, Hopeless 0 0 0 0 0  PHQ - 2 Score 0 0 0 0 0    Relevant past medical, surgical, family and social history reviewed Past Medical History:  Diagnosis Date  . Anemia    of chronic renal disease  . Bone spur of other site    right shoulder, managed by ortho  . Chronic pain    [name] CFNP at [facility]  . CKD (chronic kidney disease)    stage III 03/2014 (Dr. Mosetta Pigeon)  . Controlled substance agreement signed    signed 06/02/15  . Gout   . Gouty arthritis    knee, managed by Ortho  . History of YAG laser capsulotomy of lens of left eye   . Hyperlipidemia   . Hypertension   . IFG (impaired fasting glucose)   . Left knee DJD   . 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 Primary localized osteoarthritis of right knee   . Smoker    Past Surgical History:  Procedure Laterality Date  . CARPAL TUNNEL RELEASE Left   . CATARACT  EXTRACTION W/ INTRAOCULAR LENS IMPLANT Left 2008  . CATARACT EXTRACTION W/ INTRAOCULAR LENS IMPLANT Right 2008  . EYE SURGERY    . HERNIA REPAIR    . JOINT REPLACEMENT Left 03/2014  . TOTAL KNEE ARTHROPLASTY Left 03/30/2014   Procedure: TOTAL KNEE ARTHROPLASTY;  Surgeon: Nilda Simmerobert A Wainer, MD;  Location: [facility] OR;  Service: Orthopedics;  Laterality: Left;  . TOTAL KNEE ARTHROPLASTY Right 10/26/2014   Procedure: TOTAL KNEE ARTHROPLASTY;  Surgeon: Nilda Simmerobert A Wainer, MD;  Location: [facility] OR;  Service: Orthopedics;  Laterality: Right;  . UMBILICAL HERNIA REPAIR  2010   Family History  Problem Relation Age of Onset  . COPD Mother   . Kidney disease Mother   . Thyroid disease Mother   . Stroke Father 1849  . COPD Maternal Grandmother    Social History   Tobacco Use  . Smoking status: Current Every Day Smoker    Packs/day: 0.50    Years: 22.00    Pack years: 11.00    Types: Cigarettes  . Smokeless tobacco: Never Used  Substance Use Topics  . Alcohol use: No  . Drug use: No     Interim medical history since last visit reviewed. Allergies and medications reviewed  Review of Systems Per HPI unless specifically indicated above     Objective:    BP 134/76   Pulse 94   Temp 98 F (36.7 C) (Oral)   Resp 14   Ht 5\' 11"  (1.803 m)   Wt 247 lb 8 oz (112.3 kg)   SpO2 97%   BMI 34.52 kg/m   Wt Readings from Last 3 Encounters:  04/23/18 247 lb 8 oz (112.3 kg)  03/14/18 240 lb (108.9 kg)  01/17/18 240 lb (108.9 kg)    Physical Exam  Constitutional: He appears well-developed and well-nourished. No distress.  Weight gain noted  HENT:  Head: Normocephalic and atraumatic.  Eyes: EOM are normal. No scleral icterus.  Neck: No thyromegaly present.  Cardiovascular: Normal rate and regular rhythm.  Pulmonary/Chest: Effort normal and breath sounds normal.  Abdominal: Soft. Bowel sounds are normal. He exhibits no distension.  Musculoskeletal: He exhibits no edema.       Left foot: There is tenderness.       Feet:  Erythema and swelling and tenderness along the LEFT lateral foot  Neurological: Coordination normal.  Skin: Skin is warm and dry. No pallor.  Numerous tattoos  Psychiatric: He has a normal mood and affect. His behavior is normal. Judgment and thought content normal. His mood appears not anxious. He does not exhibit a depressed mood.    Results for orders placed or performed in visit on 12/06/17  Compliance Drug Analysis, Ur  Result Value Ref Range   Summary FINAL       Assessment & Plan:   Problem List Items Addressed This Visit      Cardiovascular and Mediastinum   Hypertension    Continue current medicine        Endocrine   Hx of impaired glucose tolerance    Reviewed the last four A1c readings; last two have been completely normal; will follow glucose levels unless rising or symptomatic        Genitourinary   Medullary cystic disease of the kidney    Managed by nephrologist; I spoke with his specialist by phone while the  patient was here about gout management, given his complex kidney issue      Chronic kidney disease, stage III (moderate) (HCC)  Managed by nephrologist; avoiding NSAIDs        Other   Medication monitoring encounter   Relevant Orders   COMPLETE METABOLIC PANEL WITH GFR   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  An after-visit summary was printed and given to the patient at check-out.  Please see the patient instructions which may contain other information and recommendations beyond what is mentioned above in the assessment and plan.  No orders of the defined types were placed in this encounter.   Orders Placed This Encounter  Procedures  . COMPLETE METABOLIC PANEL WITH GFR  . Uric acid  . Lipid panel

## 2018-04-23 NOTE — Assessment & Plan Note (Signed)
Managed by nephrologist; avoiding NSAIDs

## 2018-04-24 ENCOUNTER — Other Ambulatory Visit: Payer: Self-pay | Admitting: Family Medicine

## 2018-04-24 DIAGNOSIS — E781 Pure hyperglyceridemia: Secondary | ICD-10-CM

## 2018-04-24 DIAGNOSIS — E786 Lipoprotein deficiency: Secondary | ICD-10-CM

## 2018-04-24 DIAGNOSIS — Z5181 Encounter for therapeutic drug level monitoring: Secondary | ICD-10-CM

## 2018-04-24 LAB — COMPLETE METABOLIC PANEL WITH GFR
AG Ratio: 1.5 (calc) (ref 1.0–2.5)
ALT: 22 U/L (ref 9–46)
AST: 16 U/L (ref 10–40)
Albumin: 4.6 g/dL (ref 3.6–5.1)
Alkaline phosphatase (APISO): 77 U/L (ref 40–115)
BUN/Creatinine Ratio: 24 (calc) — ABNORMAL HIGH (ref 6–22)
BUN: 61 mg/dL — ABNORMAL HIGH (ref 7–25)
CO2: 23 mmol/L (ref 20–32)
Calcium: 9.6 mg/dL (ref 8.6–10.3)
Chloride: 104 mmol/L (ref 98–110)
Creat: 2.58 mg/dL — ABNORMAL HIGH (ref 0.60–1.35)
GFR, Est African American: 34 mL/min/{1.73_m2} — ABNORMAL LOW (ref >=60)
GFR, Est Non African American: 29 mL/min/{1.73_m2} — ABNORMAL LOW (ref >=60)
Globulin: 3.1 g/dL (calc) (ref 1.9–3.7)
Glucose, Bld: 114 mg/dL (ref 65–139)
Potassium: 4.4 mmol/L (ref 3.5–5.3)
Sodium: 138 mmol/L (ref 135–146)
Total Bilirubin: 0.3 mg/dL (ref 0.2–1.2)
Total Protein: 7.7 g/dL (ref 6.1–8.1)

## 2018-04-24 LAB — LIPID PANEL
Cholesterol: 206 mg/dL — ABNORMAL HIGH (ref <200)
HDL: 37 mg/dL — ABNORMAL LOW (ref >40)
LDL Cholesterol (Calc): 115 mg/dL (calc) — ABNORMAL HIGH
Non-HDL Cholesterol (Calc): 169 mg/dL (calc) — ABNORMAL HIGH (ref <130)
Total CHOL/HDL Ratio: 5.6 (calc) — ABNORMAL HIGH (ref <5.0)
Triglycerides: 380 mg/dL — ABNORMAL HIGH (ref <150)

## 2018-04-24 LAB — URIC ACID: Uric Acid, Serum: 7.7 mg/dL (ref 4.0–8.0)

## 2018-04-24 MED ORDER — ATORVASTATIN CALCIUM 40 MG PO TABS: 40.0000 mg | ORAL_TABLET | Freq: Every day | ORAL | 5 refills | Status: DC

## 2018-04-24 NOTE — Progress Notes (Signed)
Increase statin; recheck lipids (fasting) in 6 weeks; ordered

## 2018-06-14 LAB — CBC AND DIFFERENTIAL: HCT: 35 — AB (ref 41–53)

## 2018-06-17 ENCOUNTER — Telehealth: Payer: Self-pay | Admitting: Family Medicine

## 2018-06-17 NOTE — Telephone Encounter (Signed)
Pt.notified

## 2018-06-17 NOTE — Telephone Encounter (Signed)
We don't prescribe his gout medicine Our med list has Uloric listed anyway; not allopurinol He should contact his nephrologist for that

## 2018-06-17 NOTE — Telephone Encounter (Signed)
Copied from CRM [id number]. Topic: Quick Communication - Rx Refill/Question >> Jun 17, 2018  8:19 AM Oneal GroutSebastian, Jennifer S wrote: Medication: allopurinol  Has the patient contacted their pharmacy? Yes.   (Agent: If no, request that the patient contact the pharmacy for the refill.) (Agent: If yes, when and what did the pharmacy advise?)  Preferred Pharmacy (with phone number or street name): [facility]  Agent: Please be advised that RX refills may take up to 3 business days. We ask that you follow-up with your pharmacy.

## 2018-06-18 ENCOUNTER — Encounter: Payer: Self-pay | Admitting: Student in an Organized Health Care Education/Training Program

## 2018-06-18 ENCOUNTER — Other Ambulatory Visit: Payer: Self-pay

## 2018-06-18 ENCOUNTER — Ambulatory Visit
Payer: Medicare Other | Attending: Student in an Organized Health Care Education/Training Program | Admitting: Student in an Organized Health Care Education/Training Program

## 2018-06-18 VITALS — BP 149/86 | HR 97 | Temp 98.6°F | Resp 18 | Ht 69.5 in | Wt 245.0 lb

## 2018-06-18 DIAGNOSIS — Z6835 Body mass index (BMI) 35.0-35.9, adult: Secondary | ICD-10-CM | POA: Insufficient documentation

## 2018-06-18 DIAGNOSIS — M1A39X1 Chronic gout due to renal impairment, multiple sites, with tophus (tophi): Secondary | ICD-10-CM

## 2018-06-18 DIAGNOSIS — M1039 Gout due to renal impairment, multiple sites: Secondary | ICD-10-CM | POA: Insufficient documentation

## 2018-06-18 DIAGNOSIS — E559 Vitamin D deficiency, unspecified: Secondary | ICD-10-CM | POA: Diagnosis not present

## 2018-06-18 DIAGNOSIS — M170 Bilateral primary osteoarthritis of knee: Secondary | ICD-10-CM | POA: Diagnosis not present

## 2018-06-18 DIAGNOSIS — Z96653 Presence of artificial knee joint, bilateral: Secondary | ICD-10-CM | POA: Diagnosis not present

## 2018-06-18 DIAGNOSIS — N183 Chronic kidney disease, stage 3 unspecified: Secondary | ICD-10-CM

## 2018-06-18 DIAGNOSIS — G894 Chronic pain syndrome: Secondary | ICD-10-CM | POA: Insufficient documentation

## 2018-06-18 DIAGNOSIS — Z760 Encounter for issue of repeat prescription: Secondary | ICD-10-CM | POA: Diagnosis not present

## 2018-06-18 DIAGNOSIS — Z79891 Long term (current) use of opiate analgesic: Secondary | ICD-10-CM | POA: Diagnosis not present

## 2018-06-18 DIAGNOSIS — E669 Obesity, unspecified: Secondary | ICD-10-CM | POA: Insufficient documentation

## 2018-06-18 DIAGNOSIS — D649 Anemia, unspecified: Secondary | ICD-10-CM | POA: Insufficient documentation

## 2018-06-18 DIAGNOSIS — Z5181 Encounter for therapeutic drug level monitoring: Secondary | ICD-10-CM | POA: Insufficient documentation

## 2018-06-18 DIAGNOSIS — I129 Hypertensive chronic kidney disease with stage 1 through stage 4 chronic kidney disease, or unspecified chronic kidney disease: Secondary | ICD-10-CM | POA: Insufficient documentation

## 2018-06-18 DIAGNOSIS — F17210 Nicotine dependence, cigarettes, uncomplicated: Secondary | ICD-10-CM | POA: Diagnosis not present

## 2018-06-18 DIAGNOSIS — M79641 Pain in right hand: Secondary | ICD-10-CM | POA: Diagnosis not present

## 2018-06-18 DIAGNOSIS — Q615 Medullary cystic kidney: Secondary | ICD-10-CM | POA: Insufficient documentation

## 2018-06-18 DIAGNOSIS — Z886 Allergy status to analgesic agent status: Secondary | ICD-10-CM | POA: Diagnosis not present

## 2018-06-18 DIAGNOSIS — Z79899 Other long term (current) drug therapy: Secondary | ICD-10-CM | POA: Diagnosis not present

## 2018-06-18 DIAGNOSIS — E785 Hyperlipidemia, unspecified: Secondary | ICD-10-CM | POA: Insufficient documentation

## 2018-06-18 MED ORDER — OXYCODONE-ACETAMINOPHEN 10-325 MG PO TABS: 1.0000 | ORAL_TABLET | Freq: Three times a day (TID) | ORAL | 0 refills | Status: DC | PRN

## 2018-06-18 MED ORDER — MORPHINE SULFATE ER 15 MG PO TBCR: 15.0000 mg | EXTENDED_RELEASE_TABLET | Freq: Every day | ORAL | 0 refills | Status: DC

## 2018-06-18 MED ORDER — PREDNISONE 10 MG PO TABS: ORAL_TABLET | ORAL | 0 refills | Status: DC

## 2018-06-18 NOTE — Progress Notes (Signed)
Patient's Name: Nathan Ramos  MRN: [id number]  Referring Provider: Arnetha Courser, MD  DOB: [birth year]  PCP: Arnetha Courser, MD  DOS: 06/18/2018  Note by: Gillis Santa, MD  Service setting: Ambulatory outpatient  Specialty: Interventional Pain Management  Location: [facility] (AMB) Pain Management Facility    Patient type: Established   Primary Reason(s) for Visit: Encounter for prescription drug management. (Level of risk: moderate)  CC: Medication Refill and Hand Pain (right hand )  HPI  Mr. Milstein is a 47 y.o. year old, male patient, who comes today for a medication management evaluation. He has Medullary cystic disease of the kidney; Left knee DJD; Chronic pain; Hypertension; Gout due to renal impairment; DJD (degenerative joint disease) of knee; Primary localized osteoarthritis of right knee; Chronic kidney disease, stage III (moderate) ([place]); Anemia; Hypertriglyceridemia; Hx of impaired glucose tolerance; Gouty arthritis; Controlled substance agreement signed; Wrist pain, right; Medication monitoring encounter; Trigger ring finger of right hand; Obesity; Tobacco abuse; Chronic pain syndrome; Vitamin D deficiency; Opioid use; and Low HDL (under 40) on their problem list. His primarily concern today is the Medication Refill and Hand Pain (right hand )  Pain Assessment: Location: Right Hand Radiating: Denies  Onset: More than a month ago Duration: Chronic pain Quality: Sharp("stabbing") Severity: 7 /10 (subjective, self-reported pain score)  Note: Reported level is inconsistent with clinical observations. Clinically the patient looks like a 4/10             When using our objective Pain Scale, levels between 6 and 10/10 are said to belong in an emergency room, as it progressively worsens from a 6/10, described as severely limiting, requiring emergency care not usually available at an outpatient pain management facility. At a 6/10 level, communication becomes difficult and requires great effort.  Assistance to reach the emergency department may be required. Facial flushing and profuse sweating along with potentially dangerous increases in heart rate and blood pressure will be evident. Effect on ADL: "domiant hand, not a good grip, limits mobility of hand, very limiting with work and have to chnage how I do things"  Timing: Constant Modifying factors: Medications help some  BP: (!) 149/86  HR: 97  Mr. Som was last scheduled for an appointment on 03/14/2018 for medication management. During today's appointment we reviewed Mr. Luhman chronic pain status, as well as his outpatient medication regimen.  Patient is here for medication refill.  endorsing worsening arthralgias.  Also feels that he is having a gout flareup and is requesting a steroid taper which is worked in the past.  The patient  reports that he does not use drugs. His body mass index is 35.66 kg/m.  Further details on both, my assessment(s), as well as the proposed treatment plan, please see below.  Controlled Substance Pharmacotherapy Assessment REMS (Risk Evaluation and Mitigation Strategy)  Analgesic:morphine 7.5 mg twice daily as needed (MME equals 15), Percocet at current dose of 10 mg 3 times daily as needed, quantity 90 (MME equals 45) MME= 60 mg/day   [name], RN  06/18/2018  9:37 AM  Sign at close encounter  Safety precautions to be maintained throughout the outpatient stay will include: orient to surroundings, keep bed in low position, maintain call bell within reach at all times, provide assistance with transfer out of bed and ambulation.   Nursing Pain Medication Assessment:  Safety precautions to be maintained throughout the outpatient stay will include: orient to surroundings, keep bed in low position, maintain call bell within  reach at all times, provide assistance with transfer out of bed and ambulation.  Medication Inspection Compliance: Pill count conducted under aseptic conditions, in front  of the patient. Neither the pills nor the bottle was removed from the patient's sight at any time. Once count was completed pills were immediately returned to the patient in their original bottle.  Medication #1: Oxycodone/acetamin.   Pill/Patch Count: 20 of 90 pills remain Pill/Patch Appearance: Markings consistent with prescribed medication Bottle Appearance: Standard pharmacy container. Clearly labeled. Filled Date: 07 / 13 / 2019 Last Medication intake:  Today  Medication #2: Morphine ER (MSContin) Pill/Patch Count: 06 of 30 pills remain Pill/Patch Appearance: Markings consistent with prescribed medication Bottle Appearance: Standard pharmacy container. Clearly labeled. Filled Date: 07 / 13 / 2019 Last Medication intake:  Today   Pharmacokinetics: Liberation and absorption (onset of action): WNL Distribution (time to peak effect): WNL Metabolism and excretion (duration of action): WNL         Pharmacodynamics: Desired effects: Analgesia: Mr. Switalski reports >50% benefit. Functional ability: Patient reports that medication allows him to accomplish basic ADLs Clinically meaningful improvement in function (CMIF): Sustained CMIF goals met Perceived effectiveness: Described as relatively effective, allowing for increase in activities of daily living (ADL) Undesirable effects: Side-effects or Adverse reactions: None reported Monitoring:  PMP: Online review of the past 66-monthperiod conducted. Compliant with practice rules and regulations Last UDS on record: Summary  Date Value Ref Range Status  12/06/2017 FINAL  Final    Comment:    ==================================================================== TOXASSURE COMP DRUG ANALYSIS,UR ==================================================================== Test                             Result       Flag       Units Drug Present and Declared for Prescription Verification   Morphine                       9409         EXPECTED    ng/mg creat   Normorphine                    218          EXPECTED   ng/mg creat    Potential sources of large amounts of morphine in the absence of    codeine include administration of morphine or use of heroin.    Normorphine is an expected metabolite of morphine.   Oxycodone                      1325         EXPECTED   ng/mg creat   Oxymorphone                    1877         EXPECTED   ng/mg creat   Noroxycodone                   677          EXPECTED   ng/mg creat   Noroxymorphone                 243          EXPECTED   ng/mg creat    Sources of oxycodone are scheduled prescription medications.    Oxymorphone, noroxycodone, and noroxymorphone are expected    metabolites of  oxycodone. Oxymorphone is also available as a    scheduled prescription medication.   Acetaminophen                  PRESENT      EXPECTED ==================================================================== Test                      Result    Flag   Units      Ref Range   Creatinine              44               mg/dL      >=20 ==================================================================== Declared Medications:  The flagging and interpretation on this report are based on the  following declared medications.  Unexpected results may arise from  inaccuracies in the declared medications.  **Note: The testing scope of this panel includes these medications:  Morphine (Morphine Sulfate)  Oxycodone (Oxycodone Acetaminophen)  **Note: The testing scope of this panel does not include small to  moderate amounts of these reported medications:  Acetaminophen (Oxycodone Acetaminophen)  **Note: The testing scope of this panel does not include following  reported medications:  Amlodipine  Atorvastatin  Febuxostat  Furosemide  Losartan (Losartan Potassium)  Naloxone  Prednisone  Vitamin D2 (Ergocalciferol) ==================================================================== For clinical consultation, please call  [phone]. ====================================================================    UDS interpretation: Compliant          Medication Assessment Form: Reviewed. Patient indicates being compliant with therapy Treatment compliance: Compliant Risk Assessment Profile: Aberrant behavior: See prior evaluations. None observed or detected today Comorbid factors increasing risk of overdose: See prior notes. No additional risks detected today Risk of substance use disorder (SUD): Low Opioid Risk Tool - 06/18/18 0933      Family History of Substance Abuse   Alcohol  Negative    Illegal Drugs  Negative    Rx Drugs  Negative      Personal History of Substance Abuse   Alcohol  Negative    Illegal Drugs  Negative    Rx Drugs  Negative      Age   Age between 42-45 years   No      History of Preadolescent Sexual Abuse   History of Preadolescent Sexual Abuse  Negative or Male      Psychological Disease   Psychological Disease  Negative    Depression  Negative      Total Score   Opioid Risk Tool Scoring  0    Opioid Risk Interpretation  Low Risk      ORT Scoring interpretation table:  Score <3 = Low Risk for SUD  Score between 4-7 = Moderate Risk for SUD  Score >8 = High Risk for Opioid Abuse   Risk Mitigation Strategies:  Patient Counseling: Covered Patient-Prescriber Agreement (PPA): Present and active  Notification to other healthcare providers: Done  Pharmacologic Plan: No change in therapy, at this time.             Laboratory Chemistry  Inflammation Markers (CRP: Acute Phase) (ESR: Chronic Phase) No results found for: CRP, ESRSEDRATE, LATICACIDVEN                       Rheumatology Markers Lab Results  Component Value Date   LABURIC 7.7 04/23/2018                        Renal Function Markers Lab Results  Component Value Date   BUN 61 (H) 04/23/2018   CREATININE 2.58 (H) 04/23/2018   BCR 24 (H) 04/23/2018   GFRAA 34 (L) 04/23/2018   GFRNONAA 29 (L)  04/23/2018                             Hepatic Function Markers Lab Results  Component Value Date   AST 16 04/23/2018   ALT 22 04/23/2018   ALBUMIN 4.6 10/16/2017   ALKPHOS 95 10/16/2017                        Electrolytes Lab Results  Component Value Date   NA 138 04/23/2018   K 4.4 04/23/2018   CL 104 04/23/2018   CALCIUM 9.6 04/23/2018                        Neuropathy Markers Lab Results  Component Value Date   HGBA1C 5.4 07/06/2017                        Bone Pathology Markers No results found for: [place], [id number], HG9924QA8, TM1962IW9, 25OHVITD1, 25OHVITD2, 25OHVITD3, TESTOFREE, TESTOSTERONE                       Coagulation Parameters Lab Results  Component Value Date   INR 1.02 10/16/2014   LABPROT 13.5 10/16/2014   APTT 37 10/16/2014   PLT 241 06/05/2016                        Cardiovascular Markers Lab Results  Component Value Date   HGB 11.3 (L) 06/05/2016   HCT 33.6 (L) 06/05/2016                         CA Markers No results found for: CEA, CA125, LABCA2                      Note: Lab results reviewed.    Meds   Current Outpatient Medications:  .  amLODipine (NORVASC) 10 MG tablet, Take 1 tablet (10 mg total) by mouth daily., Disp: 90 tablet, Rfl: 1 .  atorvastatin (LIPITOR) 40 MG tablet, Take 1 tablet (40 mg total) by mouth at bedtime. For cholesterol, Disp: 30 tablet, Rfl: 5 .  Febuxostat (ULORIC) 80 MG TABS, Take 1 tablet by mouth every other day. , Disp: , Rfl:  .  furosemide (LASIX) 40 MG tablet, Take 1 tablet (40 mg total) by mouth every other day., Disp: 45 tablet, Rfl: 1 .  losartan (COZAAR) 50 MG tablet, TAKE 1 TABLET BY MOUTH EVERY DAY, Disp: 90 tablet, Rfl: 0 .  morphine (MS CONTIN) 15 MG 12 hr tablet, Take 1 tablet (15 mg total) by mouth daily. For chronic pain -To fill on or after 06/24/18, 07/24/18, 08/22/18 -ok to fill one day early if pharmacy closed, Disp: 30 tablet, Rfl: 0 .  naloxone (NARCAN) nasal spray 4 mg/0.1 mL, Use  in case of accidental overdose with narcotics / opioids, Disp: 1 kit, Rfl: 1 .  oxyCODONE-acetaminophen (PERCOCET) 10-325 MG tablet, Take 1 tablet by mouth every 8 (eight) hours as needed for pain (max three pills per 24 hours). For chronic pain -To fill on or after 06/24/18, 07/24/18, 08/22/18 -ok to fill one day early if pharmacy closed, Disp:  90 tablet, Rfl: 0 .  prednisoLONE acetate (PRED FORTE) 1 % ophthalmic suspension, Place 2 drops into both eyes 2 (two) times daily., Disp: , Rfl: 0 .  predniSONE (DELTASONE) 10 MG tablet, Take 5 pills by mouth on day 1, then 4 on day 2, 3 on day 3, 2 on day 4, and 1 on day 5; take with food, Disp: 15 tablet, Rfl: 0 .  Vitamin D, Ergocalciferol, (DRISDOL) 50000 units CAPS capsule, Take 50,000 Units by mouth once a week. , Disp: , Rfl: 0 .  fluticasone (FLONASE) 50 MCG/ACT nasal spray, Place 2 sprays into both nostrils daily. (Patient not taking: Reported on 06/18/2018), Disp: 16 g, Rfl: 6  ROS  Constitutional: Denies any fever or chills Gastrointestinal: No reported hemesis, hematochezia, vomiting, or acute GI distress Musculoskeletal: Denies any acute onset joint swelling, redness, loss of ROM, or weakness Neurological: No reported episodes of acute onset apraxia, aphasia, dysarthria, agnosia, amnesia, paralysis, loss of coordination, or loss of consciousness  Allergies  Mr. Hallenbeck is allergic to nsaids.  [place]  Drug: Mr. Cutsforth  reports that he does not use drugs. Alcohol:  reports that he does not drink alcohol. Tobacco:  reports that he has been smoking cigarettes.  He has a 11.00 pack-year smoking history. He has never used smokeless tobacco. Medical:  has a past medical history of Anemia, Bone spur of other site, Chronic pain, CKD (chronic kidney disease), Controlled substance agreement signed, Gout, Gouty arthritis, History of YAG laser capsulotomy of lens of left eye, Hyperlipidemia, Hypertension, IFG (impaired fasting glucose), Left knee DJD, Medullary  cystic disease of the kidney, Primary localized osteoarthritis of right knee, and Smoker. Surgical: Mr. Escher  has a past surgical history that includes Carpal tunnel release (Left); Umbilical hernia repair (2010); Cataract extraction w/ intraocular lens implant (Left, 2008); Cataract extraction w/ intraocular lens implant (Right, 2008); Hernia repair; Total knee arthroplasty (Left, 03/30/2014); Eye surgery; Total knee arthroplasty (Right, 10/26/2014); and Joint replacement (Left, 03/2014). Family: family history includes COPD in his maternal grandmother and mother; Kidney disease in his mother; Stroke (age of onset: 47) in his father; Thyroid disease in his mother.  Constitutional Exam  General appearance: Well nourished, well developed, and well hydrated. In no apparent acute distress Vitals:   06/18/18 0923  BP: (!) 149/86  Pulse: 97  Resp: 18  Temp: 98.6 F (37 C)  SpO2: 100%  Weight: 245 lb (111.1 kg)  Height: 5' 9.5" (1.765 m)   BMI Assessment: Estimated body mass index is 35.66 kg/m as calculated from the following:   Height as of this encounter: 5' 9.5" (1.765 m).   Weight as of this encounter: 245 lb (111.1 kg).  BMI interpretation table: BMI level Category Range association with higher incidence of chronic pain  <18 kg/m2 Underweight   18.5-24.9 kg/m2 Ideal body weight   25-29.9 kg/m2 Overweight Increased incidence by 20%  30-34.9 kg/m2 Obese (Class I) Increased incidence by 68%  35-39.9 kg/m2 Severe obesity (Class II) Increased incidence by 136%  >40 kg/m2 Extreme obesity (Class III) Increased incidence by 254%   Patient's current BMI Ideal Body weight  Body mass index is 35.66 kg/m. Ideal body weight: 71.8 kg (158 lb 6.4 oz) Adjusted ideal body weight: 87.6 kg (193 lb 0.6 oz)   BMI Readings from Last 4 Encounters:  06/18/18 35.66 kg/m  04/23/18 34.52 kg/m  03/14/18 33.95 kg/m  01/17/18 34.93 kg/m   Wt Readings from Last 4 Encounters:  06/18/18 245 lb (111.1  kg)  04/23/18 247 lb 8 oz (112.3 kg)  03/14/18 240 lb (108.9 kg)  01/17/18 240 lb (108.9 kg)  Psych/Mental status: Alert, oriented x 3 (person, place, & time)       Eyes: PERLA Respiratory: No evidence of acute respiratory distress  Cervical Spine Area Exam  Skin & Axial Inspection: No masses, redness, edema, swelling, or associated skin lesions Alignment: Symmetrical Functional ROM: Unrestricted ROM      Stability: No instability detected Muscle Tone/Strength: Functionally intact. No obvious neuro-muscular anomalies detected. Sensory (Neurological): Unimpaired Palpation: No palpable anomalies              Upper Extremity (UE) Exam    Side: Right upper extremity  Side: Left upper extremity  Skin & Extremity Inspection: Skin color, temperature, and hair growth are WNL. No peripheral edema or cyanosis. No masses, redness, swelling, asymmetry, or associated skin lesions. No contractures.  Skin & Extremity Inspection: Skin color, temperature, and hair growth are WNL. No peripheral edema or cyanosis. No masses, redness, swelling, asymmetry, or associated skin lesions. No contractures.  Functional ROM: Unrestricted ROM          Functional ROM: Unrestricted ROM          Muscle Tone/Strength: Functionally intact. No obvious neuro-muscular anomalies detected.  Muscle Tone/Strength: Functionally intact. No obvious neuro-muscular anomalies detected.  Sensory (Neurological): Arthropathic arthralgia          Sensory (Neurological): Arthropathic arthralgia          Palpation: No palpable anomalies              Palpation: No palpable anomalies              Provocative Test(s):  Phalen's test: deferred Tinel's test: deferred Apley's scratch test (touch opposite shoulder):  Action 1 (Across chest): deferred Action 2 (Overhead): deferred Action 3 (LB reach): deferred   Provocative Test(s):  Phalen's test: deferred Tinel's test: deferred Apley's scratch test (touch opposite shoulder):  Action 1  (Across chest): deferred Action 2 (Overhead): deferred Action 3 (LB reach): deferred    Thoracic Spine Area Exam  Skin & Axial Inspection: No masses, redness, or swelling Alignment: Symmetrical Functional ROM: Unrestricted ROM Stability: No instability detected Muscle Tone/Strength: Functionally intact. No obvious neuro-muscular anomalies detected. Sensory (Neurological): Unimpaired Muscle strength & Tone: No palpable anomalies  Lumbar Spine Area Exam  Skin & Axial Inspection: No masses, redness, or swelling Alignment: Symmetrical Functional ROM: Decreased ROM affecting primarily the right Stability: No instability detected Muscle Tone/Strength: Functionally intact. No obvious neuro-muscular anomalies detected. Sensory (Neurological): Musculoskeletal pain pattern Palpation: Complains of area being tender to palpation       Provocative Tests: Hyperextension/rotation test: (+) bilaterally for facet joint pain. Lumbar quadrant test (Kemp's test): deferred today       Lateral bending test: deferred today       Patrick's Maneuver: deferred today                   FABER test: deferred today                   S-I anterior distraction/compression test: deferred today         S-I lateral compression test: deferred today         S-I Thigh-thrust test: deferred today         S-I Gaenslen's test: deferred today           Gait & Posture Assessment  Ambulation: Unassisted Gait: Relatively  normal for age and body habitus Posture: WNL   Lower Extremity Exam    Side: Right lower extremity  Side: Left lower extremity  Stability: No instability observed          Stability: No instability observed          Skin & Extremity Inspection: Skin color, temperature, and hair growth are WNL. No peripheral edema or cyanosis. No masses, redness, swelling, asymmetry, or associated skin lesions. No contractures.  Skin & Extremity Inspection: Skin color, temperature, and hair growth are WNL. No peripheral  edema or cyanosis. No masses, redness, swelling, asymmetry, or associated skin lesions. No contractures.  Functional ROM: Unrestricted ROM                  Functional ROM: Unrestricted ROM                  Muscle Tone/Strength: Functionally intact. No obvious neuro-muscular anomalies detected.  Muscle Tone/Strength: Functionally intact. No obvious neuro-muscular anomalies detected.  Sensory (Neurological): Arthropathic arthralgia  Sensory (Neurological): Arthropathic arthralgia  Palpation: No palpable anomalies  Palpation: No palpable anomalies   Assessment  Primary Diagnosis & Pertinent Problem List: The primary encounter diagnosis was Chronic pain syndrome. 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  Status Diagnosis  Persistent Persistent Persistent 1. Chronic pain syndrome   2. 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 History of bilateral knee replacement   4. Primary osteoarthritis of both knees   5. Medullary cystic disease of the kidney   6. Chronic kidney disease, stage III (moderate) ([place])     General Recommendations: The pain condition that the patient suffers from is best treated with a multidisciplinary approach that involves an increase in physical activity to prevent de-conditioning and worsening of the pain cycle, as well as psychological counseling (formal and/or informal) to address the co-morbid psychological affects of pain. Treatment will often involve judicious use of pain medications and interventional procedures to decrease the pain, allowing the patient to participate in the physical activity that will ultimately produce long-lasting pain reductions. The goal of the multidisciplinary approach is to  return the patient to a higher level of overall function and to restore their ability to perform activities of daily living.  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 Patient has been on chronic opioid therapy for the last 13 years.   Patient has been compliant with his medications as well as his urine drug screens. There is been no aberrant behavior noted. Furthermore, patient's gout is very debilitating and has resulted in advanced degenerative disease he is status post bilateral knee replacements. Patient has 6 children and works part-time. He denies any alcohol or substance abuse.Patienthas seenpain psychology and is deemed low risk for substance abuse disorder.  Here for medication refill as below. [place] PMP checked and appropriate. UDS up-to-date and appropriate. Patient compliant with therapy.  We will also prescribe prednisone 5-day steroid taper for his gout flare.  Patient waiting to follow-up with his nephrologist,  Dr. Candiss Norse regarding gout therapy especially in context of his chronic kidney disease.  Plan: -Prescription for morphine 15 mg daily, quantity 30. Okay for patient to take 7.48mtwice daily.  -Prescription for Percocet 10 mg 3 times daily as needed, quantity 90. -Patient unable to tolerate NSAID therapy given his medullary cystic kidney disease -Patient has tried various membrane stabilizers including gabapentin in the past which resulted in sedation and inability to work. Patient is also tried various muscle relaxants which resulted in sedation is not interested in trying these medications. -Prednisone 5-day taper as below for gout  flare Patient recommended to follow-up with nephrologist regarding gout management in the context of chronic kidney disease   Pharmacotherapy (Medications Ordered): Meds ordered this encounter  Medications  . predniSONE (DELTASONE) 10 MG tablet    Sig: Take 5 pills by mouth on day 1, then 4 on day 2, 3 on day 3, 2 on day 4, and 1 on day 5; take with food    Dispense:  15 tablet    Refill:  0  . DISCONTD: oxyCODONE-acetaminophen (PERCOCET) 10-325 MG tablet    Sig: Take 1 tablet by mouth every 8 (eight) hours as needed for pain (max three pills per 24 hours). For chronic pain -To fill on or after 06/24/18, 07/24/18, 08/22/18 -ok to fill one day early if pharmacy closed    Dispense:  90 tablet    Refill:  0  . DISCONTD: morphine (MS CONTIN) 15 MG 12 hr tablet    Sig: Take 1 tablet (15 mg total) by mouth daily. For chronic pain -To fill on or after 06/24/18, 07/24/18, 08/22/18 -ok to fill one day early if pharmacy closed    Dispense:  30 tablet    Refill:  0  . DISCONTD: oxyCODONE-acetaminophen (PERCOCET) 10-325 MG tablet    Sig: Take 1 tablet by mouth every 8 (eight) hours as needed for pain (max three pills per 24 hours). For chronic pain -To fill on or after 06/24/18, 07/24/18, 08/22/18 -ok to fill one day early if pharmacy closed    Dispense:  90 tablet    Refill:  0  . DISCONTD: morphine (MS CONTIN) 15 MG 12 hr tablet    Sig: Take 1 tablet (15 mg total) by mouth daily. For chronic pain -To fill on or after 06/24/18, 07/24/18, 08/22/18 -ok to fill one day early if pharmacy closed    Dispense:  30 tablet    Refill:  0  . oxyCODONE-acetaminophen (PERCOCET) 10-325 MG tablet    Sig: Take 1 tablet by mouth every 8 (eight) hours as needed for pain (max three pills per 24 hours). For chronic pain -To fill on or after 06/24/18, 07/24/18, 08/22/18 -ok to fill one day early if pharmacy closed    Dispense:  90 tablet    Refill:  0  . morphine (MS CONTIN) 15 MG 12 hr tablet    Sig: Take 1 tablet  (15 mg total) by mouth daily. For chronic pain -To fill on or after 06/24/18, 07/24/18, 08/22/18 -ok to fill one day early if pharmacy closed    Dispense:  30 tablet    Refill:  0   Time Note: Greater than 50% of the 25 minute(s) of face-to-face time spent with Mr. DCumming was spent in counseling/coordination of care regarding: the appropriate use of the pain scale, opioid tolerance, Mr. DKersteinprimary cause of pain, medication side effects, the opioid analgesic risks and possible complications, the appropriate use of his medications, realistic  expectations, the goals of pain management (increased in functionality), the medication agreement and the patient's responsibilities when it comes to controlled substances.  Provider-requested follow-up: Return in about 3 months (around 09/18/2018) for Medication Management.  Future Appointments  Date Time Provider Bernice  06/18/2018 10:00 AM Gillis Santa, MD [facility] None  10/23/2018 10:00 AM Lada, Satira Anis, MD [facility]    Primary Care Physician: Arnetha Courser, MD Location: [facility] Note by: Gillis Santa, M.D Date: 06/18/2018; Time: 9:49 AM  Patient Instructions  You have been given 3 scripts for percocet and 3 scripts for morphine

## 2018-06-18 NOTE — Patient Instructions (Signed)
You have been given 3 scripts for percocet and 3 scripts for morphine

## 2018-06-18 NOTE — Progress Notes (Signed)
  Safety precautions to be maintained throughout the outpatient stay will include: orient to surroundings, keep bed in low position, maintain call bell within reach at all times, provide assistance with transfer out of bed and ambulation.   Nursing Pain Medication Assessment:  Safety precautions to be maintained throughout the outpatient stay will include: orient to surroundings, keep bed in low position, maintain call bell within reach at all times, provide assistance with transfer out of bed and ambulation.  Medication Inspection Compliance: Pill count conducted under aseptic conditions, in front of the patient. Neither the pills nor the bottle was removed from the patient's sight at any time. Once count was completed pills were immediately returned to the patient in their original bottle.  Medication #1: Oxycodone/acetamin.   Pill/Patch Count: 20 of 90 pills remain Pill/Patch Appearance: Markings consistent with prescribed medication Bottle Appearance: Standard pharmacy container. Clearly labeled. Filled Date: 07 / 13 / 2019 Last Medication intake:  Today  Medication #2: Morphine ER (MSContin) Pill/Patch Count: 06 of 30 pills remain Pill/Patch Appearance: Markings consistent with prescribed medication Bottle Appearance: Standard pharmacy container. Clearly labeled. Filled Date: 07 / 13 / 2019 Last Medication intake:  Today

## 2018-07-03 ENCOUNTER — Other Ambulatory Visit: Payer: Self-pay | Admitting: Nephrology

## 2018-07-03 ENCOUNTER — Ambulatory Visit
Admission: RE | Admit: 2018-07-03 | Discharge: 2018-07-03 | Disposition: A | Discharge status: Home / Self Care | Payer: Medicare Other | Source: Ambulatory Visit | Attending: Nephrology | Admitting: Nephrology

## 2018-07-03 DIAGNOSIS — M1000 Idiopathic gout, unspecified site: Secondary | ICD-10-CM | POA: Diagnosis not present

## 2018-07-03 DIAGNOSIS — M19041 Primary osteoarthritis, right hand: Secondary | ICD-10-CM | POA: Insufficient documentation

## 2018-07-03 DIAGNOSIS — M1A00X0 Idiopathic chronic gout, unspecified site, without tophus (tophi): Secondary | ICD-10-CM | POA: Insufficient documentation

## 2018-07-03 DIAGNOSIS — M79641 Pain in right hand: Secondary | ICD-10-CM | POA: Insufficient documentation

## 2018-07-03 DIAGNOSIS — I10 Essential (primary) hypertension: Secondary | ICD-10-CM | POA: Diagnosis not present

## 2018-07-03 DIAGNOSIS — R52 Pain, unspecified: Secondary | ICD-10-CM

## 2018-07-03 DIAGNOSIS — N184 Chronic kidney disease, stage 4 (severe): Secondary | ICD-10-CM | POA: Diagnosis not present

## 2018-07-03 DIAGNOSIS — Q615 Medullary cystic kidney: Secondary | ICD-10-CM | POA: Diagnosis not present

## 2018-07-04 LAB — CBC AND DIFFERENTIAL: Hemoglobin: 11.9 — AB (ref 13.5–17.5)

## 2018-07-10 ENCOUNTER — Encounter: Payer: Self-pay | Admitting: Family Medicine

## 2018-07-26 ENCOUNTER — Telehealth: Payer: Self-pay | Admitting: Family Medicine

## 2018-07-26 NOTE — Telephone Encounter (Signed)
We got a note from insurance company He must be getting colchicine from someone Find out if just PRN for flares or if he's taking it every day now We want him to be aware that colchicine increases the risk of statin-induced myopathy (muscle pain, muscle breakdown, which can lead to rhabdo and renal failure) If he takes colchicine just PRN, then DON'T take the cholesterol medicine for 3 days after taking the colchicine If he is taking the colchicine every single day now, back to me

## 2018-07-26 NOTE — Telephone Encounter (Signed)
Left detailed voicemail

## 2018-09-17 ENCOUNTER — Ambulatory Visit
Payer: Medicare Other | Attending: Student in an Organized Health Care Education/Training Program | Admitting: Student in an Organized Health Care Education/Training Program

## 2018-09-17 ENCOUNTER — Encounter: Payer: Self-pay | Admitting: Student in an Organized Health Care Education/Training Program

## 2018-09-17 ENCOUNTER — Other Ambulatory Visit: Payer: Self-pay

## 2018-09-17 VITALS — HR 94 | Temp 98.0°F | Resp 18 | Ht 69.5 in | Wt 242.0 lb

## 2018-09-17 DIAGNOSIS — M1A39X1 Chronic gout due to renal impairment, multiple sites, with tophus (tophi): Secondary | ICD-10-CM

## 2018-09-17 DIAGNOSIS — E785 Hyperlipidemia, unspecified: Secondary | ICD-10-CM | POA: Diagnosis not present

## 2018-09-17 DIAGNOSIS — G894 Chronic pain syndrome: Secondary | ICD-10-CM | POA: Diagnosis not present

## 2018-09-17 DIAGNOSIS — Z9842 Cataract extraction status, left eye: Secondary | ICD-10-CM | POA: Insufficient documentation

## 2018-09-17 DIAGNOSIS — M79671 Pain in right foot: Secondary | ICD-10-CM | POA: Insufficient documentation

## 2018-09-17 DIAGNOSIS — Z841 Family history of disorders of kidney and ureter: Secondary | ICD-10-CM | POA: Diagnosis not present

## 2018-09-17 DIAGNOSIS — Z96653 Presence of artificial knee joint, bilateral: Secondary | ICD-10-CM | POA: Diagnosis not present

## 2018-09-17 DIAGNOSIS — M79672 Pain in left foot: Secondary | ICD-10-CM | POA: Diagnosis not present

## 2018-09-17 DIAGNOSIS — M25512 Pain in left shoulder: Secondary | ICD-10-CM | POA: Diagnosis not present

## 2018-09-17 DIAGNOSIS — Z961 Presence of intraocular lens: Secondary | ICD-10-CM | POA: Diagnosis not present

## 2018-09-17 DIAGNOSIS — M79642 Pain in left hand: Secondary | ICD-10-CM | POA: Insufficient documentation

## 2018-09-17 DIAGNOSIS — M170 Bilateral primary osteoarthritis of knee: Secondary | ICD-10-CM

## 2018-09-17 DIAGNOSIS — Q619 Cystic kidney disease, unspecified: Secondary | ICD-10-CM | POA: Insufficient documentation

## 2018-09-17 DIAGNOSIS — E669 Obesity, unspecified: Secondary | ICD-10-CM | POA: Diagnosis not present

## 2018-09-17 DIAGNOSIS — Z79891 Long term (current) use of opiate analgesic: Secondary | ICD-10-CM | POA: Insufficient documentation

## 2018-09-17 DIAGNOSIS — F17210 Nicotine dependence, cigarettes, uncomplicated: Secondary | ICD-10-CM | POA: Insufficient documentation

## 2018-09-17 DIAGNOSIS — I129 Hypertensive chronic kidney disease with stage 1 through stage 4 chronic kidney disease, or unspecified chronic kidney disease: Secondary | ICD-10-CM | POA: Insufficient documentation

## 2018-09-17 DIAGNOSIS — M1039 Gout due to renal impairment, multiple sites: Secondary | ICD-10-CM | POA: Insufficient documentation

## 2018-09-17 DIAGNOSIS — N183 Chronic kidney disease, stage 3 unspecified: Secondary | ICD-10-CM

## 2018-09-17 DIAGNOSIS — Z886 Allergy status to analgesic agent status: Secondary | ICD-10-CM | POA: Insufficient documentation

## 2018-09-17 DIAGNOSIS — Z9841 Cataract extraction status, right eye: Secondary | ICD-10-CM | POA: Insufficient documentation

## 2018-09-17 DIAGNOSIS — M79641 Pain in right hand: Secondary | ICD-10-CM | POA: Diagnosis present

## 2018-09-17 DIAGNOSIS — Z8349 Family history of other endocrine, nutritional and metabolic diseases: Secondary | ICD-10-CM | POA: Insufficient documentation

## 2018-09-17 DIAGNOSIS — Z6835 Body mass index (BMI) 35.0-35.9, adult: Secondary | ICD-10-CM | POA: Insufficient documentation

## 2018-09-17 DIAGNOSIS — Q615 Medullary cystic kidney: Secondary | ICD-10-CM

## 2018-09-17 DIAGNOSIS — Z79899 Other long term (current) drug therapy: Secondary | ICD-10-CM | POA: Diagnosis not present

## 2018-09-17 DIAGNOSIS — M25511 Pain in right shoulder: Secondary | ICD-10-CM | POA: Diagnosis not present

## 2018-09-17 DIAGNOSIS — E559 Vitamin D deficiency, unspecified: Secondary | ICD-10-CM | POA: Diagnosis not present

## 2018-09-17 DIAGNOSIS — Z825 Family history of asthma and other chronic lower respiratory diseases: Secondary | ICD-10-CM | POA: Insufficient documentation

## 2018-09-17 DIAGNOSIS — Z823 Family history of stroke: Secondary | ICD-10-CM | POA: Insufficient documentation

## 2018-09-17 MED ORDER — OXYCODONE-ACETAMINOPHEN 10-325 MG PO TABS: 1.0000 | ORAL_TABLET | Freq: Three times a day (TID) | ORAL | 0 refills | Status: DC | PRN

## 2018-09-17 MED ORDER — MORPHINE SULFATE ER 15 MG PO TBCR: 15.0000 mg | EXTENDED_RELEASE_TABLET | Freq: Every day | ORAL | 0 refills | Status: DC

## 2018-09-17 NOTE — Progress Notes (Signed)
Patient's Name: SHERYL TOWELL  MRN: [id number]  Referring Provider: Arnetha Courser, MD  DOB: [birth year]  PCP: Arnetha Courser, MD  DOS: 09/17/2018  Note by: Gillis Santa, MD  Service setting: Ambulatory outpatient  Specialty: Interventional Pain Management  Location: [facility] (AMB) Pain Management Facility    Patient type: Established   Primary Reason(s) for Visit: Encounter for prescription drug management. (Level of risk: moderate)  CC: Hand Pain (bilateral); Foot Pain (bilateral); and Shoulder Pain (right)  HPI  Mr. Smethers is a 47 y.o. year old, male patient, who comes today for a medication management evaluation. He has Medullary cystic disease of the kidney; Left knee DJD; Chronic pain; Hypertension; Gout due to renal impairment; DJD (degenerative joint disease) of knee; Primary localized osteoarthritis of right knee; Chronic kidney disease, stage III (moderate) ([place]); Anemia; Hypertriglyceridemia; Hx of impaired glucose tolerance; Gouty arthritis; Controlled substance agreement signed; Wrist pain, right; Medication monitoring encounter; Trigger ring finger of right hand; Obesity; Tobacco abuse; Chronic pain syndrome; Vitamin D deficiency; Opioid use; and Low HDL (under 40) on their problem list. His primarily concern today is the Hand Pain (bilateral); Foot Pain (bilateral); and Shoulder Pain (right)  Pain Assessment: Location: Right, Left Hand Radiating: "shoulder pain radiates to neck" Onset: More than a month ago Duration:   Quality: Constant Severity: 8 /10 (subjective, self-reported pain score)  Note: Reported level is compatible with observation.                         When using our objective Pain Scale, levels between 6 and 10/10 are said to belong in an emergency room, as it progressively worsens from a 6/10, described as severely limiting, requiring emergency care not usually available at an outpatient pain management facility. At a 6/10 level, communication becomes difficult and  requires great effort. Assistance to reach the emergency department may be required. Facial flushing and profuse sweating along with potentially dangerous increases in heart rate and blood pressure will be evident. Effect on ADL:   Timing: Constant Modifying factors: meds BP:    HR: 94  Mr. Cunnington was last scheduled for an appointment on 06/18/2018 for medication management. During today's appointment we reviewed Mr. Schroth chronic pain status, as well as his outpatient medication regimen.  The patient  reports that he does not use drugs. His body mass index is 35.22 kg/m.  Further details on both, my assessment(s), as well as the proposed treatment plan, please see below.  Controlled Substance Pharmacotherapy Assessment REMS (Risk Evaluation and Mitigation Strategy)  Analgesic:morphine 7.5 mg twice daily as needed (MME equals 15), Percocet at current dose of 10 mg 3 times daily as needed, quantity 90 (MME equals 45) MME= 60 mg/day Rise Patience, RN  09/17/2018 10:30 AM  Sign at close encounter Nursing Pain Medication Assessment:  Safety precautions to be maintained throughout the outpatient stay will include: orient to surroundings, keep bed in low position, maintain call bell within reach at all times, provide assistance with transfer out of bed and ambulation.  Medication Inspection Compliance: Pill count conducted under aseptic conditions, in front of the patient. Neither the pills nor the bottle was removed from the patient's sight at any time. Once count was completed pills were immediately returned to the patient in their original bottle.  Medication #1: Oxycodone/APAP Pill/Patch Count: 16 of 90 pills remain Pill/Patch Appearance: Markings consistent with prescribed medication Bottle Appearance: Standard pharmacy container. Clearly labeled. Filled Date: 31 / 34 /  2019 Last Medication intake:  Today  Medication #2: Morphine ER (MSContin) Pill/Patch Count: 8 of 80 pills  remain Pill/Patch Appearance: Markings consistent with prescribed medication Bottle Appearance: Standard pharmacy container. Clearly labeled. Filled Date:  /  / Date part of label has been torn off bottle Last Medication intake:  Today   Pharmacokinetics: Liberation and absorption (onset of action): WNL Distribution (time to peak effect): WNL Metabolism and excretion (duration of action): WNL         Pharmacodynamics: Desired effects: Analgesia: Mr. Whedbee reports >50% benefit. Functional ability: Patient reports that medication allows him to accomplish basic ADLs Clinically meaningful improvement in function (CMIF): Sustained CMIF goals met Perceived effectiveness: Described as relatively effective, allowing for increase in activities of daily living (ADL) Undesirable effects: Side-effects or Adverse reactions: None reported Monitoring: [place] PMP: Online review of the past 56-monthperiod conducted. Compliant with practice rules and regulations Last UDS on record: Summary  Date Value Ref Range Status  12/06/2017 FINAL  Final    Comment:    ==================================================================== TOXASSURE COMP DRUG ANALYSIS,UR ==================================================================== Test                             Result       Flag       Units Drug Present and Declared for Prescription Verification   Morphine                       9409         EXPECTED   ng/mg creat   Normorphine                    218          EXPECTED   ng/mg creat    Potential sources of large amounts of morphine in the absence of    codeine include administration of morphine or use of heroin.    Normorphine is an expected metabolite of morphine.   Oxycodone                      1325         EXPECTED   ng/mg creat   Oxymorphone                    1877         EXPECTED   ng/mg creat   Noroxycodone                   677          EXPECTED   ng/mg creat   Noroxymorphone                 243           EXPECTED   ng/mg creat    Sources of oxycodone are scheduled prescription medications.    Oxymorphone, noroxycodone, and noroxymorphone are expected    metabolites of oxycodone. Oxymorphone is also available as a    scheduled prescription medication.   Acetaminophen                  PRESENT      EXPECTED ==================================================================== Test                      Result    Flag   Units      Ref Range   Creatinine  44               mg/dL      >=20 ==================================================================== Declared Medications:  The flagging and interpretation on this report are based on the  following declared medications.  Unexpected results may arise from  inaccuracies in the declared medications.  **Note: The testing scope of this panel includes these medications:  Morphine (Morphine Sulfate)  Oxycodone (Oxycodone Acetaminophen)  **Note: The testing scope of this panel does not include small to  moderate amounts of these reported medications:  Acetaminophen (Oxycodone Acetaminophen)  **Note: The testing scope of this panel does not include following  reported medications:  Amlodipine  Atorvastatin  Febuxostat  Furosemide  Losartan (Losartan Potassium)  Naloxone  Prednisone  Vitamin D2 (Ergocalciferol) ==================================================================== For clinical consultation, please call [phone]. ====================================================================    UDS interpretation: Compliant          Medication Assessment Form: Reviewed. Patient indicates being compliant with therapy Treatment compliance: Compliant Risk Assessment Profile: Aberrant behavior: See prior evaluations. None observed or detected today Comorbid factors increasing risk of overdose: See prior notes. No additional risks detected today Opioid risk tool (ORT) (Total Score): 1 Personal History of Substance  Abuse (SUD-Substance use disorder):  Alcohol: Negative  Illegal Drugs: Negative  Rx Drugs: Negative  ORT Risk Level calculation: Low Risk Risk of substance use disorder (SUD): Low Opioid Risk Tool - 09/17/18 1017      Family History of Substance Abuse   Alcohol  Negative    Illegal Drugs  Negative    Rx Drugs  Negative      Personal History of Substance Abuse   Alcohol  Negative    Illegal Drugs  Negative    Rx Drugs  Negative      Age   Age between 35-45 years   Yes      History of Preadolescent Sexual Abuse   History of Preadolescent Sexual Abuse  Negative or Male      Psychological Disease   Psychological Disease  Negative    Depression  Negative      Total Score   Opioid Risk Tool Scoring  1    Opioid Risk Interpretation  Low Risk      ORT Scoring interpretation table:  Score <3 = Low Risk for SUD  Score between 4-7 = Moderate Risk for SUD  Score >8 = High Risk for Opioid Abuse   Risk Mitigation Strategies:  Patient Counseling: Covered Patient-Prescriber Agreement (PPA): Present and active  Notification to other healthcare providers: Done  Pharmacologic Plan: No change in therapy, at this time.             Laboratory Chemistry  Inflammation Markers (CRP: Acute Phase) (ESR: Chronic Phase) No results found for: CRP, ESRSEDRATE, LATICACIDVEN                       Rheumatology Markers Lab Results  Component Value Date   LABURIC 7.7 04/23/2018                        Renal Function Markers Lab Results  Component Value Date   BUN 61 (H) 04/23/2018   CREATININE 2.58 (H) 04/23/2018   BCR 24 (H) 04/23/2018   GFRAA 34 (L) 04/23/2018   GFRNONAA 29 (L) 04/23/2018  Hepatic Function Markers Lab Results  Component Value Date   AST 16 04/23/2018   ALT 22 04/23/2018   ALBUMIN 4.6 10/16/2017   ALKPHOS 95 10/16/2017                        Electrolytes Lab Results  Component Value Date   NA 138 04/23/2018   K 4.4 04/23/2018    CL 104 04/23/2018   CALCIUM 9.6 04/23/2018                        Neuropathy Markers Lab Results  Component Value Date   HGBA1C 5.4 07/06/2017                        CNS Tests No results found for: COLORCSF, APPEARCSF, RBCCOUNTCSF, WBCCSF, POLYSCSF, LYMPHSCSF, EOSCSF, PROTEINCSF, GLUCCSF, JCVIRUS, CSFOLI, IGGCSF                      Bone Pathology Markers No results found for: VD25OH, [id number], [id number], [id number], 25OHVITD1, 25OHVITD2, 25OHVITD3, TESTOFREE, TESTOSTERONE                       Coagulation Parameters Lab Results  Component Value Date   INR 1.02 10/16/2014   LABPROT 13.5 10/16/2014   APTT 37 10/16/2014   PLT 241 06/05/2016                        Cardiovascular Markers Lab Results  Component Value Date   HGB 11.9 (A) 07/04/2018   HCT 35 (A) 06/14/2018                         CA Markers No results found for: CEA, CA125, LABCA2                      Note: Lab results reviewed.  Recent Diagnostic Imaging Results  DG Hand Complete Right CLINICAL DATA:  Hand pain  EXAM: RIGHT HAND - COMPLETE 3+ VIEW  COMPARISON:  None.  FINDINGS: Negative for acute fracture.  Advanced degenerative change in the radiocarpal joint with joint space narrowing and spurring. Degenerative change in the distal radioulnar joint also is advanced. Interphalangeal joints intact. No erosion.  IMPRESSION: Advanced degenerative change in the radiocarpal joint and distal radioulnar joint. No acute abnormality.  Electronically Signed   By: Franchot Gallo M.D.   On: 07/03/2018 13:01  Complexity Note: Imaging results reviewed. Results shared with Mr. Harty, using Layman's terms.                         Meds   Current Outpatient Medications:  .  amLODipine (NORVASC) 10 MG tablet, Take 1 tablet (10 mg total) by mouth daily., Disp: 90 tablet, Rfl: 1 .  atorvastatin (LIPITOR) 40 MG tablet, Take 1 tablet (40 mg total) by mouth at bedtime. For cholesterol, Disp: 30 tablet, Rfl:  5 .  Febuxostat (ULORIC) 80 MG TABS, Take 1 tablet by mouth every other day. , Disp: , Rfl:  .  furosemide (LASIX) 40 MG tablet, Take 1 tablet (40 mg total) by mouth every other day., Disp: 45 tablet, Rfl: 1 .  losartan (COZAAR) 50 MG tablet, TAKE 1 TABLET BY MOUTH EVERY DAY, Disp: 90 tablet, Rfl: 0 .  morphine (MS CONTIN) 15 MG 12 hr  tablet, Take 1 tablet (15 mg total) by mouth daily. For chronic pain -To fill on or after 09/23/2018, 10/22/2018, 11/21/2018 -ok to fill one day early if pharmacy closed, Disp: 30 tablet, Rfl: 0 .  naloxone (NARCAN) nasal spray 4 mg/0.1 mL, Use in case of accidental overdose with narcotics / opioids, Disp: 1 kit, Rfl: 1 .  oxyCODONE-acetaminophen (PERCOCET) 10-325 MG tablet, Take 1 tablet by mouth every 8 (eight) hours as needed for pain (max three pills per 24 hours). For chronic pain -To fill on or after 09/23/2018, 10/22/2018, 11/21/2018 -ok to fill one day early if pharmacy closed, Disp: 90 tablet, Rfl: 0 .  prednisoLONE acetate (PRED FORTE) 1 % ophthalmic suspension, Place 2 drops into both eyes 2 (two) times daily., Disp: , Rfl: 0 .  predniSONE (DELTASONE) 10 MG tablet, Take 5 pills by mouth on day 1, then 4 on day 2, 3 on day 3, 2 on day 4, and 1 on day 5; take with food, Disp: 15 tablet, Rfl: 0 .  Vitamin D, Ergocalciferol, (DRISDOL) 50000 units CAPS capsule, Take 50,000 Units by mouth once a week. , Disp: , Rfl: 0  ROS  Constitutional: Denies any fever or chills Gastrointestinal: No reported hemesis, hematochezia, vomiting, or acute GI distress Musculoskeletal: Denies any acute onset joint swelling, redness, loss of ROM, or weakness Neurological: No reported episodes of acute onset apraxia, aphasia, dysarthria, agnosia, amnesia, paralysis, loss of coordination, or loss of consciousness  Allergies  Mr. Frankson is allergic to nsaids.  [place]  Drug: Mr. Heckendorn  reports that he does not use drugs. Alcohol:  reports that he does not drink alcohol. Tobacco:  reports  that he has been smoking cigarettes. He has a 11.00 pack-year smoking history. He has never used smokeless tobacco. Medical:  has a past medical history of Anemia, Bone spur of other site, Chronic pain, CKD (chronic kidney disease), Controlled substance agreement signed, Gout, Gouty arthritis, History of YAG laser capsulotomy of lens of left eye, Hyperlipidemia, Hypertension, IFG (impaired fasting glucose), Left knee DJD, Medullary cystic disease of the kidney, Primary localized osteoarthritis of right knee, and Smoker. Surgical: Mr. Forget  has a past surgical history that includes Carpal tunnel release (Left); Umbilical hernia repair (2010); Cataract extraction w/ intraocular lens implant (Left, 2008); Cataract extraction w/ intraocular lens implant (Right, 2008); Hernia repair; Total knee arthroplasty (Left, 03/30/2014); Eye surgery; Total knee arthroplasty (Right, 10/26/2014); and Joint replacement (Left, 03/2014). Family: family history includes COPD in his maternal grandmother and mother; Kidney disease in his mother; Stroke (age of onset: 27) in his father; Thyroid disease in his mother.  Constitutional Exam  General appearance: Well nourished, well developed, and well hydrated. In no apparent acute distress Vitals:   09/17/18 1010  Pulse: 94  Resp: 18  Temp: 98 F (36.7 C)  TempSrc: Oral  SpO2: 93%  Weight: 242 lb (109.8 kg)  Height: 5' 9.5" (1.765 m)   BMI Assessment: Estimated body mass index is 35.22 kg/m as calculated from the following:   Height as of this encounter: 5' 9.5" (1.765 m).   Weight as of this encounter: 242 lb (109.8 kg).  BMI interpretation table: BMI level Category Range association with higher incidence of chronic pain  <18 kg/m2 Underweight   18.5-24.9 kg/m2 Ideal body weight   25-29.9 kg/m2 Overweight Increased incidence by 20%  30-34.9 kg/m2 Obese (Class I) Increased incidence by 68%  35-39.9 kg/m2 Severe obesity (Class II) Increased incidence by 136%  >40  kg/m2 Extreme  obesity (Class III) Increased incidence by 254%   Patient's current BMI Ideal Body weight  Body mass index is 35.22 kg/m. Ideal body weight: 71.8 kg (158 lb 6.4 oz) Adjusted ideal body weight: 87 kg (191 lb 13.4 oz)   BMI Readings from Last 4 Encounters:  09/17/18 35.22 kg/m  06/18/18 35.66 kg/m  04/23/18 34.52 kg/m  03/14/18 33.95 kg/m   Wt Readings from Last 4 Encounters:  09/17/18 242 lb (109.8 kg)  06/18/18 245 lb (111.1 kg)  04/23/18 247 lb 8 oz (112.3 kg)  03/14/18 240 lb (108.9 kg)  Psych/Mental status: Alert, oriented x 3 (person, place, & time)       Eyes: PERLA Respiratory: No evidence of acute respiratory distress  Cervical Spine Area Exam  Skin & Axial Inspection: No masses, redness, edema, swelling, or associated skin lesions Alignment: Symmetrical Functional ROM: Unrestricted ROM      Stability: No instability detected Muscle Tone/Strength: Functionally intact. No obvious neuro-muscular anomalies detected. Sensory (Neurological): Unimpaired Palpation: No palpable anomalies              Upper Extremity (UE) Exam    Side: Right upper extremity  Side: Left upper extremity  Skin & Extremity Inspection: Skin color, temperature, and hair growth are WNL. No peripheral edema or cyanosis. No masses, redness, swelling, asymmetry, or associated skin lesions. No contractures.  Skin & Extremity Inspection: Skin color, temperature, and hair growth are WNL. No peripheral edema or cyanosis. No masses, redness, swelling, asymmetry, or associated skin lesions. No contractures.  Functional ROM: Unrestricted ROM          Functional ROM: Unrestricted ROM          Muscle Tone/Strength: Functionally intact. No obvious neuro-muscular anomalies detected.  Muscle Tone/Strength: Functionally intact. No obvious neuro-muscular anomalies detected.  Sensory (Neurological): Unimpaired          Sensory (Neurological): Unimpaired          Palpation: No palpable anomalies               Palpation: No palpable anomalies              Provocative Test(s):  Phalen's test: deferred Tinel's test: deferred Apley's scratch test (touch opposite shoulder):  Action 1 (Across chest): deferred Action 2 (Overhead): deferred Action 3 (LB reach): deferred   Provocative Test(s):  Phalen's test: deferred Tinel's test: deferred Apley's scratch test (touch opposite shoulder):  Action 1 (Across chest): deferred Action 2 (Overhead): deferred Action 3 (LB reach): deferred    Thoracic Spine Area Exam  Skin & Axial Inspection: No masses, redness, or swelling Alignment: Symmetrical Functional ROM: Unrestricted ROM Stability: No instability detected Muscle Tone/Strength: Functionally intact. No obvious neuro-muscular anomalies detected. Sensory (Neurological): Unimpaired Muscle strength & Tone: No palpable anomalies  Lumbar Spine Area Exam  Skin & Axial Inspection: No masses, redness, or swelling Alignment: Symmetrical Functional ROM: Decreased ROM       Stability: No instability detected Muscle Tone/Strength: Functionally intact. No obvious neuro-muscular anomalies detected. Sensory (Neurological): Musculoskeletal pain pattern Palpation: Complains of area being tender to palpation       Provocative Tests: Hyperextension/rotation test: (+) bilaterally for facet joint pain. Lumbar quadrant test (Kemp's test): deferred today       Lateral bending test: deferred today       Patrick's Maneuver: deferred today                   FABER test: deferred today  S-I anterior distraction/compression test: deferred today         S-I lateral compression test: deferred today         S-I Thigh-thrust test: deferred today         S-I Gaenslen's test: deferred today          Gait & Posture Assessment  Ambulation: Unassisted Gait: Relatively normal for age and body habitus Posture: WNL   Lower Extremity Exam    Side: Right lower extremity  Side: Left lower extremity   Stability: No instability observed          Stability: No instability observed          Skin & Extremity Inspection: Skin color, temperature, and hair growth are WNL. No peripheral edema or cyanosis. No masses, redness, swelling, asymmetry, or associated skin lesions. No contractures.  Skin & Extremity Inspection: Skin color, temperature, and hair growth are WNL. No peripheral edema or cyanosis. No masses, redness, swelling, asymmetry, or associated skin lesions. No contractures.  Functional ROM: Unrestricted ROM                  Functional ROM: Unrestricted ROM                  Muscle Tone/Strength: Functionally intact. No obvious neuro-muscular anomalies detected.  Muscle Tone/Strength: Functionally intact. No obvious neuro-muscular anomalies detected.  Sensory (Neurological): Arthropathic arthralgia  Sensory (Neurological): Arthropathic arthralgia  Palpation: No palpable anomalies  Palpation: No palpable anomalies   Assessment  Primary Diagnosis & Pertinent Problem List: The primary encounter diagnosis was Chronic pain syndrome. 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  Status Diagnosis  Controlled Controlled Controlled 1. Chronic pain syndrome   2. 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 History of bilateral knee replacement   4. Primary osteoarthritis of both knees   5. Medullary cystic disease of the kidney   6. Chronic kidney disease, stage III (moderate) ([place])      General Recommendations: The pain condition that the patient suffers from is best treated with a multidisciplinary approach that involves an increase in physical activity to prevent de-conditioning and worsening of the pain cycle, as well as  psychological counseling (formal and/or informal) to address the co-morbid psychological affects of pain. Treatment will often involve judicious use of pain medications and interventional procedures to decrease the pain, allowing the patient to participate in the physical activity that will ultimately produce long-lasting pain reductions. The goal of the multidisciplinary approach is to return the patient to a higher level of overall function and to restore their ability to perform activities of daily living.  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 Patient has been on chronic opioid therapy for the last 13 years.  Patient has been compliant with his medications as well as his urine drug screens. There is been no aberrant behavior noted. Furthermore, patient's gout is very debilitating and has resulted in advanced degenerative disease he is status post bilateral knee replacements. Patient has  6 children and works part-time. He denies any alcohol or substance abuse.Patienthas seenpain psychology and is deemed low risk for substance abuse disorder.  Here for medication refill as below. [place] PMP checked and appropriate. UDS today for compliance and monitoring.   Plan of Care    Plan: -Prescription for morphine 15 mg daily, quantity 30. Okay for patient to take 7.93mtwice daily.  -Prescription for Percocet 10 mg 3 times daily as needed, quantity 90. -Patient unable to tolerate NSAID therapy given his medullary cystic kidney disease -Patient has tried various membrane stabilizers including gabapentin in the past which resulted in sedation and inability to work.  Patient is also tried various muscle relaxants which resulted in sedation is not interested in trying these medications.  Lab-work, procedure(s), and/or referral(s): Orders Placed This Encounter  Procedures  . ToxASSURE Select 13 (MW), Urine   Requested Prescriptions   Signed Prescriptions Disp Refills  . oxyCODONE-acetaminophen (PERCOCET) 10-325 MG tablet 90 tablet 0    Sig: Take 1 tablet by mouth every 8 (eight) hours as needed for pain (max three pills per 24 hours). For chronic pain -To fill on or after 09/23/2018, 10/22/2018, 11/21/2018 -ok to fill one day early if pharmacy closed  . morphine (MS CONTIN) 15 MG 12 hr tablet 30 tablet 0    Sig: Take 1 tablet (15 mg total) by mouth daily. For chronic pain -To fill on or after 09/23/2018, 10/22/2018, 11/21/2018 -ok to fill one day early if pharmacy closed      Provider-requested follow-up: Return in about 3 months (around 12/18/2018) for Medication Management.  Time Note: Greater than 50% of the 25 minute(s) of face-to-face time spent with Mr. DElls was spent in counseling/coordination of care regarding: the appropriate use of the pain scale, opioid tolerance, Mr. DAbalosprimary cause of pain, medication side effects, the opioid analgesic risks and possible complications, the appropriate use of his medications, realistic expectations, the goals of pain management (increased in functionality), the medication agreement and the patient's responsibilities when it comes to controlled substances.  Future Appointments  Date Time Provider [place] [place] 10/23/2018 10:00 AM Lada, MSatira Anis MD [email or web address]    Primary Care Physician: LArnetha Courser MD [facility] Note by: BGillis Santa M.D Date: 09/17/2018; Time: 10:38 AM  There are no Patient Instructions on file for this visit.

## 2018-09-17 NOTE — Progress Notes (Signed)
Nursing Pain Medication Assessment:  Safety precautions to be maintained throughout the outpatient stay will include: orient to surroundings, keep bed in low position, maintain call bell within reach at all times, provide assistance with transfer out of bed and ambulation.  Medication Inspection Compliance: Pill count conducted under aseptic conditions, in front of the patient. Neither the pills nor the bottle was removed from the patient's sight at any time. Once count was completed pills were immediately returned to the patient in their original bottle.  Medication #1: Oxycodone/APAP Pill/Patch Count: 16 of 90 pills remain Pill/Patch Appearance: Markings consistent with prescribed medication Bottle Appearance: Standard pharmacy container. Clearly labeled. Filled Date: 10 / 12 / 2019 Last Medication intake:  Today  Medication #2: Morphine ER (MSContin) Pill/Patch Count: 8 of 80 pills remain Pill/Patch Appearance: Markings consistent with prescribed medication Bottle Appearance: Standard pharmacy container. Clearly labeled. Filled Date:  /  / Date part of label has been torn off bottle Last Medication intake:  Today

## 2018-09-17 NOTE — Patient Instructions (Signed)
You have been given 3 Rx for Morphine and 3 Rx for oxycodone with tylenol to last until 12/21/2018.

## 2018-09-21 LAB — TOXASSURE SELECT 13 (MW), URINE

## 2018-10-23 ENCOUNTER — Encounter: Payer: Self-pay | Admitting: Family Medicine

## 2018-10-23 ENCOUNTER — Ambulatory Visit (INDEPENDENT_AMBULATORY_CARE_PROVIDER_SITE_OTHER): Payer: Medicare Other | Admitting: Family Medicine

## 2018-10-23 DIAGNOSIS — N2889 Other specified disorders of kidney and ureter: Secondary | ICD-10-CM | POA: Diagnosis not present

## 2018-10-23 DIAGNOSIS — I151 Hypertension secondary to other renal disorders: Secondary | ICD-10-CM

## 2018-10-23 DIAGNOSIS — N183 Chronic kidney disease, stage 3 unspecified: Secondary | ICD-10-CM

## 2018-10-23 DIAGNOSIS — E781 Pure hyperglyceridemia: Secondary | ICD-10-CM | POA: Diagnosis not present

## 2018-10-23 DIAGNOSIS — M1A39X1 Chronic gout due to renal impairment, multiple sites, with tophus (tophi): Secondary | ICD-10-CM

## 2018-10-23 DIAGNOSIS — R739 Hyperglycemia, unspecified: Secondary | ICD-10-CM | POA: Diagnosis not present

## 2018-10-23 DIAGNOSIS — Z5181 Encounter for therapeutic drug level monitoring: Secondary | ICD-10-CM

## 2018-10-23 DIAGNOSIS — Q615 Medullary cystic kidney: Secondary | ICD-10-CM | POA: Diagnosis not present

## 2018-10-23 NOTE — Assessment & Plan Note (Signed)
Encouraged weight loss and healthy eating; BMI > 35 with gout, hyperlipidemia, HTN

## 2018-10-23 NOTE — Assessment & Plan Note (Signed)
Monitored by nephrologist, on colchicine PRN; avoid statin x 3 days, discussed with patient

## 2018-10-23 NOTE — Assessment & Plan Note (Signed)
Check liver; renal function monitored by nephrologist

## 2018-10-23 NOTE — Progress Notes (Signed)
BP 138/80   Pulse 99   Temp 98.1 F (36.7 C) (Oral)   Ht 5\' 10"  (1.778 m)   Wt 249 lb 9.6 oz (113.2 kg)   SpO2 97%   BMI 35.81 kg/m    Subjective:    Patient ID: Nathan Ramos, male    DOB: August 17, 1971, 47 y.o.   MRN: [id number]  HPI: Nathan Ramos is a 47 y.o. male  Chief Complaint  Patient presents with  . 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; then has to take older kids to work  CKD; last visit was a few months ago; seeing him every 6 months; he added   Gout; monitored by nephrologist; started back on colchicine by nephrologist; last flare early October; took two rounds of prednisone back to back a few months ago, that made him gain weight Glucose 160 in August but not fasting; dry mouth; no fam hx of diabetes  Depression screen [facility] 2/9 10/23/2018 09/17/2018 06/18/2018 04/23/2018 03/14/2018  Decreased Interest 0 0 0 0 0  Down, Depressed, Hopeless 0 0 0 0 0  PHQ - 2 Score 0 0 0 0 0  Altered sleeping 0 - - - -  Tired, decreased energy 0 - - - -  Change in appetite 0 - - - -  Feeling bad or failure about yourself  0 - - - -  Trouble concentrating 0 - - - -  Moving slowly or fidgety/restless 0 - - - -  Suicidal thoughts 0 - - - -  PHQ-9 Score 0 - - - -  Difficult doing work/chores Not difficult at all - - - -   Fall Risk  10/23/2018 09/17/2018 06/18/2018 04/23/2018 03/14/2018  Falls in the past year? 0 0 No No Yes  Number falls in past yr: - - - - 1  Injury with Fall? - - - - -    Relevant past  medical, surgical, family and social history reviewed Past Medical History:  Diagnosis Date  . Anemia    of chronic renal disease  . Bone spur of other site    right shoulder, managed by ortho  . Chronic pain    [name] CFNP at [facility]  . CKD (chronic kidney disease)    stage III 03/2014 (Dr. Mosetta Pigeon)  . Controlled substance agreement signed    signed 06/02/15  . Gout   . Gouty arthritis    knee, managed by Ortho  . History of Nathan Ramos laser capsulotomy of lens of left eye   . Hyperlipidemia   . Hypertension   . IFG (impaired fasting glucose)   . Left knee DJD   . 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 Primary localized osteoarthritis of right knee   . Smoker    Past Surgical History:  Procedure Laterality Date  . CARPAL TUNNEL RELEASE Left   . CATARACT EXTRACTION  W/ INTRAOCULAR LENS IMPLANT Left 2008  . CATARACT EXTRACTION W/ INTRAOCULAR LENS IMPLANT Right 2008  . EYE SURGERY    . HERNIA REPAIR    . JOINT REPLACEMENT Left 03/2014  . TOTAL KNEE ARTHROPLASTY Left 03/30/2014   Procedure: TOTAL KNEE ARTHROPLASTY;  Surgeon: Nilda Simmerobert A Wainer, MD;  Location: [facility] OR;  Service: Orthopedics;  Laterality: Left;  . TOTAL KNEE ARTHROPLASTY Right 10/26/2014   Procedure: TOTAL KNEE ARTHROPLASTY;  Surgeon: Nilda Simmerobert A Wainer, MD;  Location: [facility] OR;  Service: Orthopedics;  Laterality: Right;  . UMBILICAL HERNIA REPAIR  2010   Family History  Problem Relation Age of Onset  . COPD Mother   . Kidney disease Mother   . Thyroid disease Mother   . Stroke Father [id number]  . COPD Maternal Grandmother    Social History   Tobacco Use  . Smoking status: Current Every Day Smoker    Packs/day: 0.50    Years: 22.00    Pack years: 11.00    Types: Cigarettes  . Smokeless tobacco: Never Used  Substance Use Topics  . Alcohol use: No  . Drug use: No     [facility]  AUDIT-C Score  0       Interim medical history since last visit reviewed. Allergies and medications reviewed  Review of Systems Per HPI unless specifically indicated above     Objective:    BP 138/80   Pulse 99   Temp 98.1 F (36.7 C) (Oral)   Ht 5\' 10"  (1.778 m)   Wt 249 lb 9.6 oz (113.2 kg)   SpO2 97%   BMI 35.81 kg/m   Wt Readings from Last 3 Encounters:  10/23/18 249 lb 9.6 oz (113.2 kg)  09/17/18 242 lb (109.8 kg)  06/18/18 245 lb (111.1 kg)    Physical Exam  Constitutional: He appears well-developed and well-nourished. No distress.  HENT:  Head: Normocephalic and atraumatic.  Eyes: EOM are normal. No scleral icterus.  Neck: No thyromegaly present.  Cardiovascular: Normal rate and regular rhythm.  Pulmonary/Chest: Effort normal and breath sounds normal.  Abdominal: Soft. Bowel sounds are normal. He exhibits no distension.  Musculoskeletal: He exhibits no edema.  Neurological: Coordination normal.  Skin: Skin is warm and dry. No pallor.  Psychiatric: He has a normal mood and affect. His behavior is normal. Judgment and thought content normal.    Results for orders placed or performed in visit on 09/17/18  ToxASSURE Select 13 (MW), Urine  Result Value Ref Range   Summary FINAL       Assessment & Plan:   Problem List Items Addressed This Visit      Cardiovascular and Mediastinum   Hypertension    DASH guidelines        Genitourinary   Medullary cystic disease of the kidney    Monitored by nephrologist      Chronic kidney disease, stage III (moderate) (HCC)    Followed by nephrologist        Other   Morbid obesity (HCC)    Encouraged weight loss and healthy eating; BMI > 35 with gout, hyperlipidemia, HTN      Medication monitoring encounter    Check liver; renal function monitored by nephrologist      Relevant Orders   Hepatic function panel   Hypertriglyceridemia    Limit starches, check labs today      Relevant Orders   Lipid panel   Hyperglycemia  Not fasting today, but will check A1c since glucose has been elevated; weight loss is so important      Relevant Orders   Hemoglobin A1c   Gout due to renal impairment    Monitored by nephrologist, on colchicine PRN; avoid statin x 3 days, discussed with patient          Follow up plan: No follow-ups on file.  An after-visit summary was printed and given to the patient at check-out.  Please see the patient instructions which may contain other information and recommendations beyond what is mentioned above in the assessment and plan.  No orders of the defined types were placed in this encounter.   Orders Placed This Encounter  Procedures  . Lipid panel  . Hemoglobin A1c  . Hepatic function panel

## 2018-10-23 NOTE — Patient Instructions (Addendum)
Do not take atorvastatin for 3 days if you take a colchicine  Try to use PLAIN allergy medicine without the decongestant Avoid: phenylephrine, phenylpropanolamine, and pseudoephredine  Try to follow the DASH guidelines (DASH stands for Dietary Approaches to Stop Hypertension). Try to limit the sodium in your diet to no more than 1,500mg  of sodium per day. Certainly try to not exceed 2,000 mg per day at the very most. Do not add salt when cooking or at the table.  Check the sodium amount on labels when shopping, and choose items lower in sodium when given a choice. Avoid or limit foods that already contain a lot of sodium. Eat a diet rich in fruits and vegetables and whole grains, and try to lose weight if overweight or obese

## 2018-10-23 NOTE — Assessment & Plan Note (Signed)
Monitored by nephrologist 

## 2018-10-23 NOTE — Assessment & Plan Note (Signed)
Limit starches, check labs today

## 2018-10-23 NOTE — Assessment & Plan Note (Signed)
Not fasting today, but will check A1c since glucose has been elevated; weight loss is so important

## 2018-10-23 NOTE — Assessment & Plan Note (Signed)
Followed by nephrologist 

## 2018-10-23 NOTE — Assessment & Plan Note (Signed)
DASH guidelines 

## 2018-10-24 LAB — LIPID PANEL
Cholesterol: 179 mg/dL (ref <200)
HDL: 26 mg/dL — ABNORMAL LOW (ref >40)
Non-HDL Cholesterol (Calc): 153 mg/dL (calc) — ABNORMAL HIGH (ref <130)
Total CHOL/HDL Ratio: 6.9 (calc) — ABNORMAL HIGH (ref <5.0)
Triglycerides: 481 mg/dL — ABNORMAL HIGH (ref <150)

## 2018-10-24 LAB — HEPATIC FUNCTION PANEL
AG Ratio: 1.4 (calc) (ref 1.0–2.5)
ALT: 17 U/L (ref 9–46)
AST: 19 U/L (ref 10–40)
Albumin: 4.6 g/dL (ref 3.6–5.1)
Alkaline phosphatase (APISO): 89 U/L (ref 40–115)
Bilirubin, Direct: 0.1 mg/dL (ref 0.0–0.2)
Globulin: 3.3 g/dL (calc) (ref 1.9–3.7)
Indirect Bilirubin: 0.3 mg/dL (calc) (ref 0.2–1.2)
Total Bilirubin: 0.4 mg/dL (ref 0.2–1.2)
Total Protein: 7.9 g/dL (ref 6.1–8.1)

## 2018-10-24 LAB — HEMOGLOBIN A1C
Hgb A1c MFr Bld: 5.6 % of total Hgb (ref <5.7)
Mean Plasma Glucose: 114 (calc)
eAG (mmol/L): 6.3 (calc)

## 2018-10-28 NOTE — Progress Notes (Signed)
Cala BradfordKimberly, please let the patient know that his cholesterol has gotten worse since the last check. At this point, I'm going to recommend a REFERRAL to a lipid specialist. Please enter referral to endo for hypertriglyceridemia, low HDL. We'll encourage him to lose weight, eat healthier, and try to walk or get exercise in the pool. His 3 month blood sugar average is normal. His liver enzymes are normal. Thank you

## 2018-10-29 ENCOUNTER — Telehealth: Payer: Self-pay

## 2018-10-29 DIAGNOSIS — E781 Pure hyperglyceridemia: Secondary | ICD-10-CM

## 2018-10-29 NOTE — Telephone Encounter (Signed)
-----   Message from Kerman PasseyMelinda P Lada, MD sent at 10/28/2018  4:44 PM EST ----- Cala BradfordKimberly, please let the patient know that his cholesterol has gotten worse since the last check. At this point, I'm going to recommend a REFERRAL to a lipid specialist. Please enter referral to endo for hypertriglyceridemia, low HDL. We'll encourage him to lose weight, eat healthier, and try to walk or get exercise in the pool. His 3 month blood sugar average is normal. His liver enzymes are normal. Thank you

## 2018-12-05 ENCOUNTER — Other Ambulatory Visit: Payer: Self-pay

## 2018-12-05 ENCOUNTER — Ambulatory Visit
Payer: Medicare Other | Attending: Student in an Organized Health Care Education/Training Program | Admitting: Student in an Organized Health Care Education/Training Program

## 2018-12-05 ENCOUNTER — Encounter: Payer: Self-pay | Admitting: Student in an Organized Health Care Education/Training Program

## 2018-12-05 VITALS — BP 155/93 | HR 88 | Temp 98.6°F | Resp 18 | Ht 69.5 in | Wt 249.0 lb

## 2018-12-05 DIAGNOSIS — Z96653 Presence of artificial knee joint, bilateral: Secondary | ICD-10-CM | POA: Diagnosis not present

## 2018-12-05 DIAGNOSIS — N183 Chronic kidney disease, stage 3 unspecified: Secondary | ICD-10-CM

## 2018-12-05 DIAGNOSIS — M170 Bilateral primary osteoarthritis of knee: Secondary | ICD-10-CM | POA: Diagnosis not present

## 2018-12-05 DIAGNOSIS — Q615 Medullary cystic kidney: Secondary | ICD-10-CM | POA: Diagnosis not present

## 2018-12-05 DIAGNOSIS — G894 Chronic pain syndrome: Secondary | ICD-10-CM | POA: Insufficient documentation

## 2018-12-05 MED ORDER — OXYCODONE-ACETAMINOPHEN 10-325 MG PO TABS: 1.0000 | ORAL_TABLET | Freq: Three times a day (TID) | ORAL | 0 refills | Status: DC | PRN

## 2018-12-05 MED ORDER — MORPHINE SULFATE ER 15 MG PO TBCR: 15.0000 mg | EXTENDED_RELEASE_TABLET | Freq: Every day | ORAL | 0 refills | Status: DC

## 2018-12-05 NOTE — Progress Notes (Signed)
Patient's Name: Nathan Ramos  MRN: [id number]  Referring Provider: Arnetha Courser, MD  DOB: [birth year]  PCP: Arnetha Courser, MD  DOS: 12/05/2018  Note by: Gillis Santa, MD  Service setting: Ambulatory outpatient  Specialty: Interventional Pain Management  Location: [facility] (AMB) Pain Management Facility    Patient type: Established   Primary Reason(s) for Visit: Encounter for prescription drug management. (Level of risk: moderate)  CC: Medication Refill (morphine and oxycodone ); Knee Pain; Hand Pain; Back Pain; and Foot Pain  HPI  Nathan Ramos is a 48 y.o. year old, male patient, who comes today for a medication management evaluation. He has Medullary cystic disease of the kidney; Left knee DJD; Chronic pain; Hypertension; Gout due to renal impairment; DJD (degenerative joint disease) of knee; Primary localized osteoarthritis of right knee; Chronic kidney disease, stage III (moderate) ([place]); Anemia; Hypertriglyceridemia; Hx of impaired glucose tolerance; Gouty arthritis; Controlled substance agreement signed; Wrist pain, right; Medication monitoring encounter; Trigger ring finger of right hand; Obesity; Tobacco abuse; Chronic pain syndrome; Vitamin D deficiency; Opioid use; Low HDL (under 40); Hyperglycemia; and Morbid obesity ([place]) on their problem list. His primarily concern today is the Medication Refill (morphine and oxycodone ); Knee Pain; Hand Pain; Back Pain; and Foot Pain  Pain Assessment: Location: Right, Left (all ove pain (hand, knee, back, foot) ) Radiating: "Pain jumps to different areas" Onset: More than a month ago Duration: Chronic pain Quality: Aching, Radiating, Stabbing Severity: 5 /10 (subjective, self-reported pain score)  Note: Reported level is compatible with observation.                         When using our objective Pain Scale, levels between 6 and 10/10 are said to belong in an emergency room, as it progressively worsens from a 6/10, described as severely limiting,  requiring emergency care not usually available at an outpatient pain management facility. At a 6/10 level, communication becomes difficult and requires great effort. Assistance to reach the emergency department may be required. Facial flushing and profuse sweating along with potentially dangerous increases in heart rate and blood pressure will be evident. Effect on ADL: "Walking is limited, carring bags, sometimes hard to close hands in the morning"  Timing: Constant Modifying factors: Morphine and oxycodone/trying to stay active  BP: (!) 155/93  HR: 88  Nathan Ramos was last scheduled for an appointment on 09/17/2018 for medication management. During today's appointment we reviewed Nathan Ramos chronic pain status, as well as his outpatient medication regimen.  The patient  reports no history of drug use. His body mass index is 36.24 kg/m.  Further details on both, my assessment(s), as well as the proposed treatment plan, please see below.  Controlled Substance Pharmacotherapy Assessment REMS (Risk Evaluation and Mitigation Strategy)  Analgesic:morphine 7.5 mg twice daily as needed (MME equals 15), Percocet at current dose of 10 mg 3 times daily as needed, quantity 90 (MME equals 45) MME= 60 mg/day [name], RN  12/05/2018 10:33 AM  Sign when Signing Visit Nursing Pain Medication Assessment:  Safety precautions to be maintained throughout the outpatient stay will include: orient to surroundings, keep bed in low position, maintain call bell within reach at all times, provide assistance with transfer out of bed and ambulation.  Medication Inspection Compliance: Pill count conducted under aseptic conditions, in front of the patient. Neither the pills nor the bottle was removed from the patient's sight at any time. Once count was completed  pills were immediately returned to the patient in their original bottle.  Medication #1: Oxycodone/APAP Pill/Patch Count: 57 of 90 pills remain Pill/Patch  Appearance: Markings consistent with prescribed medication Bottle Appearance: Standard pharmacy container. Clearly labeled. Filled Date: 1 / 3 / 2020 Last Medication intake:  Today  Medication #2: Morphine ER (MSContin) Pill/Patch Count: 19 of 30 pills remain Pill/Patch Appearance: Markings consistent with prescribed medication Bottle Appearance: Standard pharmacy container. Clearly labeled. Filled Date: 01 / 12 / 2020 Last Medication intake:  Today   Pharmacokinetics: Liberation and absorption (onset of action): WNL Distribution (time to peak effect): WNL Metabolism and excretion (duration of action): WNL         Pharmacodynamics: Desired effects: Analgesia: Nathan Ramos reports >50% benefit. Functional ability: Patient reports that medication allows him to accomplish basic ADLs Clinically meaningful improvement in function (CMIF): Sustained CMIF goals met Perceived effectiveness: Described as relatively effective, allowing for increase in activities of daily living (ADL) Undesirable effects: Side-effects or Adverse reactions: None reported Monitoring: [place] PMP: Online review of the past 10-monthperiod conducted. Compliant with practice rules and regulations Last UDS on record: Summary  Date Value Ref Range Status  09/17/2018 FINAL  Final    Comment:    ==================================================================== TOXASSURE SELECT 13 (MW) ==================================================================== Test                             Result       Flag       Units Drug Present and Declared for Prescription Verification   Morphine                       3725         EXPECTED   ng/mg creat   Normorphine                    97           EXPECTED   ng/mg creat    Potential sources of large amounts of morphine in the absence of    codeine include administration of morphine or use of heroin.    Normorphine is an expected metabolite of morphine.   Oxycodone                       744          EXPECTED   ng/mg creat   Oxymorphone                    2835         EXPECTED   ng/mg creat   Noroxycodone                   565          EXPECTED   ng/mg creat   Noroxymorphone                 398          EXPECTED   ng/mg creat    Sources of oxycodone are scheduled prescription medications.    Oxymorphone, noroxycodone, and noroxymorphone are expected    metabolites of oxycodone. Oxymorphone is also available as a    scheduled prescription medication. ==================================================================== Test                      Result    Flag   Units  Ref Range   Creatinine              108              mg/dL      >=20 ==================================================================== Declared Medications:  The flagging and interpretation on this report are based on the  following declared medications.  Unexpected results may arise from  inaccuracies in the declared medications.  **Note: The testing scope of this panel includes these medications:  Morphine (MS Contin)  Oxycodone (Percocet)  **Note: The testing scope of this panel does not include following  reported medications:  Acetaminophen (Percocet)  Amlodipine (Norvasc)  Atorvastatin (Lipitor)  Febuxostat  Furosemide (Lasix)  Losartan (Cozaar)  Naloxone (Narcan)  Prednisolone  Prednisone (Deltasone)  Vitamin D2 (Drisdol) ==================================================================== For clinical consultation, please call [phone]. ====================================================================    UDS interpretation: Compliant          Medication Assessment Form: Reviewed. Patient indicates being compliant with therapy Treatment compliance: Compliant Risk Assessment Profile: Aberrant behavior: See prior evaluations. None observed or detected today Comorbid factors increasing risk of overdose: See prior notes. No additional risks detected today Opioid risk tool  (ORT) (Total Score): 1 Personal History of Substance Abuse (SUD-Substance use disorder):  Alcohol: Negative  Illegal Drugs: Negative  Rx Drugs: Negative  ORT Risk Level calculation: Low Risk Risk of substance use disorder (SUD): Low Opioid Risk Tool - 12/05/18 1030      Family History of Substance Abuse   Alcohol  Negative    Illegal Drugs  Negative    Rx Drugs  Negative      Personal History of Substance Abuse   Alcohol  Negative    Illegal Drugs  Negative    Rx Drugs  Negative      Age   Age between 75-45 years   Yes      Psychological Disease   Psychological Disease  Negative    Depression  Negative      Total Score   Opioid Risk Tool Scoring  1    Opioid Risk Interpretation  Low Risk      ORT Scoring interpretation table:  Score <3 = Low Risk for SUD  Score between 4-7 = Moderate Risk for SUD  Score >8 = High Risk for Opioid Abuse   Risk Mitigation Strategies:  Patient Counseling: Covered Patient-Prescriber Agreement (PPA): Present and active  Notification to other healthcare providers: Done  Pharmacologic Plan: No change in therapy, at this time.             Laboratory Chemistry  Inflammation Markers (CRP: Acute Phase) (ESR: Chronic Phase) No results found for: CRP, ESRSEDRATE, LATICACIDVEN                       Rheumatology Markers Lab Results  Component Value Date   LABURIC 7.7 04/23/2018                        Renal Function Markers Lab Results  Component Value Date   BUN 61 (H) 04/23/2018   CREATININE 2.58 (H) 04/23/2018   BCR 24 (H) 04/23/2018   GFRAA 34 (L) 04/23/2018   GFRNONAA 29 (L) 04/23/2018                             Hepatic Function Markers Lab Results  Component Value Date   AST 19 10/23/2018   ALT 17 10/23/2018  ALBUMIN 4.6 10/16/2017   ALKPHOS 95 10/16/2017                        Electrolytes Lab Results  Component Value Date   NA 138 04/23/2018   K 4.4 04/23/2018   CL 104 04/23/2018   CALCIUM 9.6 04/23/2018                         Neuropathy Markers Lab Results  Component Value Date   HGBA1C 5.6 10/23/2018                        CNS Tests No results found for: COLORCSF, APPEARCSF, RBCCOUNTCSF, WBCCSF, POLYSCSF, LYMPHSCSF, EOSCSF, PROTEINCSF, GLUCCSF, JCVIRUS, CSFOLI, IGGCSF                      Bone Pathology Markers No results found for: VD25OH, [id number], [id number], [id number], 25OHVITD1, 25OHVITD2, 25OHVITD3, TESTOFREE, TESTOSTERONE                       Coagulation Parameters Lab Results  Component Value Date   INR 1.02 10/16/2014   LABPROT 13.5 10/16/2014   APTT 37 10/16/2014   PLT 241 06/05/2016                        Cardiovascular Markers Lab Results  Component Value Date   HGB 11.9 (A) 07/04/2018   HCT 35 (A) 06/14/2018                         CA Markers No results found for: CEA, CA125, LABCA2                      Note: Lab results reviewed.  Recent Diagnostic Imaging Results  DG Hand Complete Right CLINICAL DATA:  Hand pain  EXAM: RIGHT HAND - COMPLETE 3+ VIEW  COMPARISON:  None.  FINDINGS: Negative for acute fracture.  Advanced degenerative change in the radiocarpal joint with joint space narrowing and spurring. Degenerative change in the distal radioulnar joint also is advanced. Interphalangeal joints intact. No erosion.  IMPRESSION: Advanced degenerative change in the radiocarpal joint and distal radioulnar joint. No acute abnormality.  Electronically Signed   By: Franchot Gallo M.D.   On: 07/03/2018 13:01  Complexity Note: Imaging results reviewed. Results shared with Mr. Maglione, using Layman's terms.                         Meds   Current Outpatient Medications:  .  amLODipine (NORVASC) 10 MG tablet, Take 1 tablet (10 mg total) by mouth daily., Disp: 90 tablet, Rfl: 1 .  atorvastatin (LIPITOR) 40 MG tablet, Take 1 tablet (40 mg total) by mouth at bedtime. For cholesterol, Disp: 30 tablet, Rfl: 5 .  colchicine 0.6 MG tablet, Take 0.6 mg by mouth  as needed., Disp: , Rfl:  .  Febuxostat (ULORIC) 80 MG TABS, Take 1 tablet by mouth every other day. , Disp: , Rfl:  .  furosemide (LASIX) 40 MG tablet, Take 1 tablet (40 mg total) by mouth every other day., Disp: 45 tablet, Rfl: 1 .  losartan (COZAAR) 50 MG tablet, TAKE 1 TABLET BY MOUTH EVERY DAY, Disp: 90 tablet, Rfl: 0 .  [START ON 12/24/2018] morphine (MS CONTIN) 15 MG 12 hr tablet,  Take 1 tablet (15 mg total) by mouth daily for 30 days. For chronic pain ok to fill one day early if pharmacy closed, Disp: 30 tablet, Rfl: 0 .  naloxone (NARCAN) nasal spray 4 mg/0.1 mL, Use in case of accidental overdose with narcotics / opioids, Disp: 1 kit, Rfl: 1 .  [START ON 12/24/2018] oxyCODONE-acetaminophen (PERCOCET) 10-325 MG tablet, Take 1 tablet by mouth every 8 (eight) hours as needed for up to 30 days for pain (max three pills per 24 hours). For chronic pain, ok to fill one day early if pharmacy closed, Disp: 90 tablet, Rfl: 0 .  prednisoLONE acetate (PRED FORTE) 1 % ophthalmic suspension, Place 2 drops into both eyes 2 (two) times daily., Disp: , Rfl: 0 .  predniSONE (DELTASONE) 10 MG tablet, Take 5 pills by mouth on day 1, then 4 on day 2, 3 on day 3, 2 on day 4, and 1 on day 5; take with food, Disp: 15 tablet, Rfl: 0 .  Vitamin D, Ergocalciferol, (DRISDOL) 50000 units CAPS capsule, Take 50,000 Units by mouth once a week. , Disp: , Rfl: 0 .  [START ON 01/23/2019] morphine (MS CONTIN) 15 MG 12 hr tablet, Take 1 tablet (15 mg total) by mouth daily for 30 days., Disp: 30 tablet, Rfl: 0 .  [START ON 02/22/2019] morphine (MS CONTIN) 15 MG 12 hr tablet, Take 1 tablet (15 mg total) by mouth daily for 30 days., Disp: 30 tablet, Rfl: 0 .  [START ON 01/23/2019] oxyCODONE-acetaminophen (PERCOCET) 10-325 MG tablet, Take 1 tablet by mouth every 8 (eight) hours as needed for up to 30 days for pain., Disp: 90 tablet, Rfl: 0 .  [START ON 02/22/2019] oxyCODONE-acetaminophen (PERCOCET) 10-325 MG tablet, Take 1 tablet by mouth  every 8 (eight) hours as needed for up to 30 days for pain., Disp: 90 tablet, Rfl: 0  ROS  Constitutional: Denies any fever or chills Gastrointestinal: No reported hemesis, hematochezia, vomiting, or acute GI distress Musculoskeletal: Denies any acute onset joint swelling, redness, loss of ROM, or weakness Neurological: No reported episodes of acute onset apraxia, aphasia, dysarthria, agnosia, amnesia, paralysis, loss of coordination, or loss of consciousness  Allergies  Mr. Pyle is allergic to nsaids.  [place]  Drug: Mr. Berg  reports no history of drug use. Alcohol:  reports no history of alcohol use. Tobacco:  reports that he has been smoking cigarettes. He has a 11.00 pack-year smoking history. He has never used smokeless tobacco. Medical:  has a past medical history of Anemia, Bone spur of other site, Chronic pain, CKD (chronic kidney disease), Controlled substance agreement signed, Gout, Gouty arthritis, History of YAG laser capsulotomy of lens of left eye, Hyperlipidemia, Hypertension, IFG (impaired fasting glucose), Left knee DJD, Medullary cystic disease of the kidney, Primary localized osteoarthritis of right knee, and Smoker. Surgical: Mr. Kise  has a past surgical history that includes Carpal tunnel release (Left); Umbilical hernia repair (2010); Cataract extraction w/ intraocular lens implant (Left, 2008); Cataract extraction w/ intraocular lens implant (Right, 2008); Hernia repair; Total knee arthroplasty (Left, 03/30/2014); Eye surgery; Total knee arthroplasty (Right, 10/26/2014); and Joint replacement (Left, 03/2014). Family: family history includes COPD in his maternal grandmother and mother; Kidney disease in his mother; Stroke (age of onset: 32) in his father; Thyroid disease in his mother.  Constitutional Exam  General appearance: Well nourished, well developed, and well hydrated. In no apparent acute distress Vitals:   12/05/18 1022  BP: (!) 155/93  Pulse: 88  Resp: 18  Temp: 98.6 F (37 C)  SpO2: 99%  Weight: 249 lb (112.9 kg)  Height: 5' 9.5" (1.765 m)   BMI Assessment: Estimated body mass index is 36.24 kg/m as calculated from the following:   Height as of this encounter: 5' 9.5" (1.765 m).   Weight as of this encounter: 249 lb (112.9 kg).  BMI interpretation table: BMI level Category Range association with higher incidence of chronic pain  <18 kg/m2 Underweight   18.5-24.9 kg/m2 Ideal body weight   25-29.9 kg/m2 Overweight Increased incidence by 20%  30-34.9 kg/m2 Obese (Class I) Increased incidence by 68%  35-39.9 kg/m2 Severe obesity (Class II) Increased incidence by 136%  >40 kg/m2 Extreme obesity (Class III) Increased incidence by 254%   Patient's current BMI Ideal Body weight  Body mass index is 36.24 kg/m. Ideal body weight: 71.8 kg (158 lb 6.4 oz) Adjusted ideal body weight: 88.3 kg (194 lb 10.2 oz)   BMI Readings from Last 4 Encounters:  12/05/18 36.24 kg/m  10/23/18 35.81 kg/m  09/17/18 35.22 kg/m  06/18/18 35.66 kg/m   Wt Readings from Last 4 Encounters:  12/05/18 249 lb (112.9 kg)  10/23/18 249 lb 9.6 oz (113.2 kg)  09/17/18 242 lb (109.8 kg)  06/18/18 245 lb (111.1 kg)  Psych/Mental status: Alert, oriented x 3 (person, place, & time)       Eyes: PERLA Respiratory: No evidence of acute respiratory distress  Cervical Spine Area Exam  Skin & Axial Inspection: No masses, redness, edema, swelling, or associated skin lesions Alignment: Symmetrical Functional ROM: Unrestricted ROM      Stability: No instability detected Muscle Tone/Strength: Functionally intact. No obvious neuro-muscular anomalies detected. Sensory (Neurological): Unimpaired Palpation: No palpable anomalies              Upper Extremity (UE) Exam    Side: Right upper extremity  Side: Left upper extremity  Skin & Extremity Inspection: Skin color, temperature, and hair growth are WNL. No peripheral edema or cyanosis. No masses, redness, swelling,  asymmetry, or associated skin lesions. No contractures.  Skin & Extremity Inspection: Skin color, temperature, and hair growth are WNL. No peripheral edema or cyanosis. No masses, redness, swelling, asymmetry, or associated skin lesions. No contractures.  Functional ROM: Unrestricted ROM          Functional ROM: Unrestricted ROM          Muscle Tone/Strength: Functionally intact. No obvious neuro-muscular anomalies detected.  Muscle Tone/Strength: Functionally intact. No obvious neuro-muscular anomalies detected.  Sensory (Neurological): Arthropathic arthralgia          Sensory (Neurological): Arthropathic arthralgia          Palpation: No palpable anomalies              Palpation: No palpable anomalies              Provocative Test(s):  Phalen's test: deferred Tinel's test: deferred Apley's scratch test (touch opposite shoulder):  Action 1 (Across chest): deferred Action 2 (Overhead): deferred Action 3 (LB reach): deferred   Provocative Test(s):  Phalen's test: deferred Tinel's test: deferred Apley's scratch test (touch opposite shoulder):  Action 1 (Across chest): deferred Action 2 (Overhead): deferred Action 3 (LB reach): deferred    Thoracic Spine Area Exam  Skin & Axial Inspection: No masses, redness, or swelling Alignment: Symmetrical Functional ROM: Unrestricted ROM Stability: No instability detected Muscle Tone/Strength: Functionally intact. No obvious neuro-muscular anomalies detected. Sensory (Neurological): Unimpaired Muscle strength & Tone: No palpable anomalies  Lumbar Spine Area  Exam  Skin & Axial Inspection: No masses, redness, or swelling Alignment: Symmetrical Functional ROM: Unrestricted ROM       Stability: No instability detected Muscle Tone/Strength: Functionally intact. No obvious neuro-muscular anomalies detected. Sensory (Neurological): Articular pain pattern Palpation: No palpable anomalies       Provocative Tests: Hyperextension/rotation test: deferred  today       Lumbar quadrant test (Kemp's test): deferred today       Lateral bending test: deferred today       Patrick's Maneuver: deferred today                   FABER* test: deferred today                   S-I anterior distraction/compression test: deferred today         S-I lateral compression test: deferred today         S-I Thigh-thrust test: deferred today         S-I Gaenslen's test: deferred today         *(Flexion, ABduction and External Rotation)  Gait & Posture Assessment  Ambulation: Unassisted Gait: Relatively normal for age and body habitus Posture: WNL   Lower Extremity Exam    Side: Right lower extremity  Side: Left lower extremity  Stability: No instability observed          Stability: No instability observed          Skin & Extremity Inspection: Skin color, temperature, and hair growth are WNL. No peripheral edema or cyanosis. No masses, redness, swelling, asymmetry, or associated skin lesions. No contractures.  Skin & Extremity Inspection: Skin color, temperature, and hair growth are WNL. No peripheral edema or cyanosis. No masses, redness, swelling, asymmetry, or associated skin lesions. No contractures.  Functional ROM: Unrestricted ROM                  Functional ROM: Unrestricted ROM                  Muscle Tone/Strength: Functionally intact. No obvious neuro-muscular anomalies detected.  Muscle Tone/Strength: Functionally intact. No obvious neuro-muscular anomalies detected.  Sensory (Neurological): Arthropathic arthralgia        Sensory (Neurological): Arthropathic arthralgia        DTR: Patellar: deferred today Achilles: deferred today Plantar: deferred today  DTR: Patellar: deferred today Achilles: deferred today Plantar: deferred today  Palpation: No palpable anomalies  Palpation: No palpable anomalies   Assessment  Primary Diagnosis & Pertinent Problem List: The primary encounter diagnosis was History of bilateral knee replacement. Diagnoses of  Chronic pain syndrome, Primary osteoarthritis of both knees, Medullary cystic disease of the kidney, and Chronic kidney disease, stage III (moderate) ([place]) were also pertinent to this visit.  Status Diagnosis  Controlled Controlled Controlled 1. History of bilateral knee replacement   2. Chronic pain syndrome   3. Primary osteoarthritis of both knees   4. Medullary cystic disease of the kidney   5. Chronic kidney disease, stage III (moderate) ([place])      General Recommendations: The pain condition that the patient suffers from is best treated with a multidisciplinary approach that involves an increase in physical activity to prevent de-conditioning and worsening of the pain cycle, as well as psychological counseling (formal and/or informal) to address the co-morbid psychological affects of pain. Treatment will often involve judicious use of pain medications and interventional procedures to decrease the pain, allowing the patient to participate in the  physical activity that will ultimately produce long-lasting pain reductions. The goal of the multidisciplinary approach is to return the patient to a higher level of overall function and to restore their ability to perform activities of daily living.  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 Patient has been on chronic opioid therapy for the last 14 years.  Patient has been compliant with his medications as well as his urine drug screens. There is been no aberrant behavior noted. Furthermore, patient's gout is very debilitating and has resulted in advanced degenerative disease he is status post bilateral knee replacements.  Patient has 6 children and works part-time. He denies any alcohol or substance abuse.Patienthas seenpain psychology and is deemed low risk for substance abuse disorder.  Here for medication refill as below. [place] PMP checked and appropriate. UDS up to date (Nov 2019) and appropriate   Plan of Care  Pharmacotherapy (Medications Ordered): Meds ordered this encounter  Medications  . oxyCODONE-acetaminophen (PERCOCET) 10-325 MG tablet    Sig: Take 1 tablet by mouth every 8 (eight) hours as needed for up to 30 days for pain (max three pills per 24 hours). For chronic pain, ok to fill one day early if pharmacy closed    Dispense:  90 tablet    Refill:  0  . oxyCODONE-acetaminophen (PERCOCET) 10-325 MG tablet    Sig: Take 1 tablet by mouth every 8 (eight) hours as needed for up to 30 days for pain.    Dispense:  90 tablet    Refill:  0    Do not place this medication, or any other prescription from our practice, on "Automatic Refill". Patient may have prescription filled one day early if pharmacy is closed on scheduled refill date.  Marland Kitchen oxyCODONE-acetaminophen (PERCOCET) 10-325 MG tablet    Sig: Take 1 tablet by mouth every 8 (eight) hours as needed for up to 30 days for pain.    Dispense:  90 tablet    Refill:  0    Do not place this medication, or any other prescription from our practice, on "Automatic Refill". Patient may have prescription filled one day early if pharmacy is closed on scheduled refill date.  . morphine (MS CONTIN) 15 MG 12 hr tablet    Sig: Take 1 tablet (15 mg total) by mouth daily for 30 days. For chronic pain ok to fill one day early if pharmacy closed    Dispense:  30 tablet    Refill:  0  . morphine (MS CONTIN) 15 MG 12 hr tablet    Sig: Take 1 tablet (15 mg total) by mouth daily for 30 days.    Dispense:  30 tablet    Refill:  0    Do not place this medication, or any other prescription from our practice, on "Automatic Refill". Patient may have prescription  filled one day early if pharmacy is closed on scheduled refill date.  . morphine (MS CONTIN) 15 MG 12 hr tablet    Sig: Take 1 tablet (15 mg total) by mouth daily for 30 days.    Dispense:  30 tablet    Refill:  0    Do not place this medication, or any other prescription from our practice, on "Automatic Refill". Patient may  have prescription filled one day early if pharmacy is closed on scheduled refill date.    Provider-requested follow-up: Return in about 3 months (around 03/06/2019) for Medication Management.  Future Appointments  Date Time Provider [place]  03/06/2019 10:00 AM Gillis Santa, MD [facility] None    Primary Care Physician: Arnetha Courser, MD Location: [facility] Note by: Gillis Santa, M.D Date: 12/05/2018; Time: 10:48 AM  Patient Instructions  You were given 3 prescriptions each for Percocet and Morphine.

## 2018-12-05 NOTE — Progress Notes (Signed)
Nursing Pain Medication Assessment:  Safety precautions to be maintained throughout the outpatient stay will include: orient to surroundings, keep bed in low position, maintain call bell within reach at all times, provide assistance with transfer out of bed and ambulation.  Medication Inspection Compliance: Pill count conducted under aseptic conditions, in front of the patient. Neither the pills nor the bottle was removed from the patient's sight at any time. Once count was completed pills were immediately returned to the patient in their original bottle.  Medication #1: Oxycodone/APAP Pill/Patch Count: 57 of 90 pills remain Pill/Patch Appearance: Markings consistent with prescribed medication Bottle Appearance: Standard pharmacy container. Clearly labeled. Filled Date: 1 / 8012 / 2020 Last Medication intake:  Today  Medication #2: Morphine ER (MSContin) Pill/Patch Count: 19 of 30 pills remain Pill/Patch Appearance: Markings consistent with prescribed medication Bottle Appearance: Standard pharmacy container. Clearly labeled. Filled Date: 01 / 12 / 2020 Last Medication intake:  Today

## 2018-12-05 NOTE — Patient Instructions (Signed)
You were given 3 prescriptions each for Percocet and Morphine.

## 2018-12-20 ENCOUNTER — Telehealth: Payer: Self-pay | Admitting: Family Medicine

## 2018-12-20 NOTE — Telephone Encounter (Signed)
I called the patient to schedule Medicare AWV-I with Nurse Health Advisor, Kasey, but there was no answer and no option to leave a message because the voicemail was full.  If the patient calls back, please schedule Medicare Wellness Visit with Nurse Health Advisor. VDM (DD) °

## 2019-01-16 ENCOUNTER — Ambulatory Visit (INDEPENDENT_AMBULATORY_CARE_PROVIDER_SITE_OTHER): Payer: Medicare Other

## 2019-01-16 VITALS — BP 138/84 | HR 84 | Temp 98.3°F | Resp 16 | Ht 70.0 in | Wt 253.1 lb

## 2019-01-16 DIAGNOSIS — Z1159 Encounter for screening for other viral diseases: Secondary | ICD-10-CM

## 2019-01-16 DIAGNOSIS — Z0000 Encounter for general adult medical examination without abnormal findings: Secondary | ICD-10-CM

## 2019-01-16 NOTE — Patient Instructions (Signed)
Nathan Ramos , Thank you for taking time to come for your Medicare Wellness Visit. I appreciate your ongoing commitment to your health goals. Please review the following plan we discussed and let me know if I can assist you in the future.   Screening recommendations/referrals: Colonoscopy: due age 48 Recommended yearly ophthalmology/optometry visit for glaucoma screening and checkup Recommended yearly dental visit for hygiene and checkup  Vaccinations: Influenza vaccine: postponed Tdap vaccine: done 04/12/10 Shingles vaccine: Shingrix discussed. Please contact your pharmacy for coverage information.   Advanced directives: Advance directive discussed with you today. Even though you declined this today please call our office should you change your mind and we can give you the proper paperwork for you to fill out.  Conditions/risks identified: Recommend increasing physical activity to 150 minutes per week  Next appointment: Please follow up in one year for your Medicare Annual Wellness visit.    Preventive Care 40-64 Years, Male Preventive care refers to lifestyle choices and visits with your health care provider that can promote health and wellness. What does preventive care include?  A yearly physical exam. This is also called an annual well check.  Dental exams once or twice a year.  Routine eye exams. Ask your health care provider how often you should have your eyes checked.  Personal lifestyle choices, including:  Daily care of your teeth and gums.  Regular physical activity.  Eating a healthy diet.  Avoiding tobacco and drug use.  Limiting alcohol use.  Practicing safe sex.  Taking low-dose aspirin every day starting at age 45. What happens during an annual well check? The services and screenings done by your health care provider during your annual well check will depend on your age, overall health, lifestyle risk factors, and family history of disease. Counseling  Your  health care provider may ask you questions about your:  Alcohol use.  Tobacco use.  Drug use.  Emotional well-being.  Home and relationship well-being.  Sexual activity.  Eating habits.  Work and work Astron[employer]. Screening  You may have the following tests or measurements:  Height, weight, and BMI.  Blood pressure.  Lipid and cholesterol levels. These may be checked every 5 years, or more frequently if you are over 6 years old.  Skin check.  Lung cancer screening. You may have this screening every year starting at age 4 if you have a 30-pack-year history of smoking and currently smoke or have quit within the past 15 years.  Fecal occult blood test (FOBT) of the stool. You may have this test every year starting at age 53.  Flexible sigmoidoscopy or colonoscopy. You may have a sigmoidoscopy every 5 years or a colonoscopy every 10 years starting at age 21.  Prostate cancer screening. Recommendations will vary depending on your family history and other risks.  Hepatitis C blood test.  Hepatitis B blood test.  Sexually transmitted disease (STD) testing.  Diabetes screening. This is done by checking your blood sugar (glucose) after you have not eaten for a while (fasting). You may have this done every 1-3 years. Discuss your test results, treatment options, and if necessary, the need for more tests with your health care provider. Vaccines  Your health care provider may recommend certain vaccines, such as:  Influenza vaccine. This is recommended every year.  Tetanus, diphtheria, and acellular pertussis (Tdap, Td) vaccine. You may need a Td booster every 10 years.  Zoster vaccine. You may need this after age 6.  Pneumococcal 13-valent conjugate (PCV13) vaccine.  You may need this if you have certain conditions and have not been vaccinated.  Pneumococcal polysaccharide (PPSV23) vaccine. You may need one or two doses if you smoke cigarettes or if you have certain  conditions. Talk to your health care provider about which screenings and vaccines you need and how often you need them. This information is not intended to replace advice given to you by your health care provider. Make sure you discuss any questions you have with your health care provider. Document Released: 11/26/2015 Document Revised: 07/19/2016 Document Reviewed: 08/31/2015 Elsevier Interactive Patient Education  2017 [employer] Prevention in the Home Falls can cause injuries. They can happen to people of all ages. There are many things you can do to make your home safe and to help prevent falls. What can I do on the outside of my home?  Regularly fix the edges of walkways and driveways and fix any cracks.  Remove anything that might make you trip as you walk through a door, such as a raised step or threshold.  Trim any bushes or trees on the path to your home.  Use bright outdoor lighting.  Clear any walking paths of anything that might make someone trip, such as rocks or tools.  Regularly check to see if handrails are loose or broken. Make sure that both sides of any steps have handrails.  Any raised decks and porches should have guardrails on the edges.  Have any leaves, snow, or ice cleared regularly.  Use sand or salt on walking paths during winter.  Clean up any spills in your garage right away. This includes oil or grease spills. What can I do in the bathroom?  Use night lights.  Install grab bars by the toilet and in the tub and shower. Do not use towel bars as grab bars.  Use non-skid mats or decals in the tub or shower.  If you need to sit down in the shower, use a plastic, non-slip stool.  Keep the floor dry. Clean up any water that spills on the floor as soon as it happens.  Remove soap buildup in the tub or shower regularly.  Attach bath mats securely with double-sided non-slip rug tape.  Do not have throw rugs and other things on the floor that  can make you trip. What can I do in the bedroom?  Use night lights.  Make sure that you have a light by your bed that is easy to reach.  Do not use any sheets or blankets that are too big for your bed. They should not hang down onto the floor.  Have a firm chair that has side arms. You can use this for support while you get dressed.  Do not have throw rugs and other things on the floor that can make you trip. What can I do in the kitchen?  Clean up any spills right away.  Avoid walking on wet floors.  Keep items that you use a lot in easy-to-reach places.  If you need to reach something above you, use a strong step stool that has a grab bar.  Keep electrical cords out of the way.  Do not use floor polish or wax that makes floors slippery. If you must use wax, use non-skid floor wax.  Do not have throw rugs and other things on the floor that can make you trip. What can I do with my stairs?  Do not leave any items on the stairs.  Make sure that  there are handrails on both sides of the stairs and use them. Fix handrails that are broken or loose. Make sure that handrails are as long as the stairways.  Check any carpeting to make sure that it is firmly attached to the stairs. Fix any carpet that is loose or worn.  Avoid having throw rugs at the top or bottom of the stairs. If you do have throw rugs, attach them to the floor with carpet tape.  Make sure that you have a light switch at the top of the stairs and the bottom of the stairs. If you do not have them, ask someone to add them for you. What else can I do to help prevent falls?  Wear shoes that:  Do not have high heels.  Have rubber bottoms.  Are comfortable and fit you well.  Are closed at the toe. Do not wear sandals.  If you use a stepladder:  Make sure that it is fully opened. Do not climb a closed stepladder.  Make sure that both sides of the stepladder are locked into place.  Ask someone to hold it for  you, if possible.  Clearly mark and make sure that you can see:  Any grab bars or handrails.  First and last steps.  Where the edge of each step is.  Use tools that help you move around (mobility aids) if they are needed. These include:  Canes.  Walkers.  Scooters.  Crutches.  Turn on the lights when you go into a dark area. Replace any light bulbs as soon as they burn out.  Set up your furniture so you have a clear path. Avoid moving your furniture around.  If any of your floors are uneven, fix them.  If there are any pets around you, be aware of where they are.  Review your medicines with your doctor. Some medicines can make you feel dizzy. This can increase your chance of falling. Ask your doctor what other things that you can do to help prevent falls. This information is not intended to replace advice given to you by your health care provider. Make sure you discuss any questions you have with your health care provider. Document Released: 08/26/2009 Document Revised: 04/06/2016 Document Reviewed: 12/04/2014 Elsevier Interactive Patient Education  2017 [employer].

## 2019-01-16 NOTE — Progress Notes (Signed)
Subjective:   Nathan Ramos is a 48 y.o. male who presents for an Initial Medicare Annual Wellness Visit.  Review of Systems   Cardiac Risk Factors include: dyslipidemia;hypertension;male gender;obesity (BMI >30kg/m2)    Objective:    Today's Vitals   01/16/19 1007 01/16/19 1018  BP: 138/84   Pulse: 84   Resp: 16   Temp: 98.3 F (36.8 C)   TempSrc: Oral   SpO2: 98%   Weight: 253 lb 1.6 oz (114.8 kg)   Height: 5\' 10"  (1.778 m)   PainSc:  5    Body mass index is 36.32 kg/m.  Advanced Directives 01/16/2019 12/05/2018 09/17/2018 06/18/2018 06/29/2017 03/30/2017 01/01/2017  Does Patient Have a Medical Advance Directive? No No No No No No No  Would patient like information on creating a medical advance directive? No - Patient declined No - Patient declined No - Patient declined No - Patient declined - - -  Pre-existing out of facility DNR order (yellow form or pink MOST form) - - - - - - -    Current Medications (verified) Outpatient Encounter Medications as of 01/16/2019  Medication Sig  . amLODipine (NORVASC) 10 MG tablet Take 1 tablet (10 mg total) by mouth daily.  Marland Kitchen. atorvastatin (LIPITOR) 40 MG tablet Take 1 tablet (40 mg total) by mouth at bedtime. For cholesterol  . colchicine 0.6 MG tablet Take 0.6 mg by mouth as needed.  . Febuxostat (ULORIC) 80 MG TABS Take 1 tablet by mouth every other day.   . furosemide (LASIX) 40 MG tablet Take 1 tablet (40 mg total) by mouth every other day.  . losartan (COZAAR) 50 MG tablet TAKE 1 TABLET BY MOUTH EVERY DAY  . morphine (MS CONTIN) 15 MG 12 hr tablet Take 1 tablet (15 mg total) by mouth daily for 30 days. For chronic pain ok to fill one day early if pharmacy closed  . oxyCODONE-acetaminophen (PERCOCET) 10-325 MG tablet Take 1 tablet by mouth every 8 (eight) hours as needed for up to 30 days for pain (max three pills per 24 hours). For chronic pain, ok to fill one day early if pharmacy closed  . predniSONE (DELTASONE) 10 MG tablet Take 5  pills by mouth on day 1, then 4 on day 2, 3 on day 3, 2 on day 4, and 1 on day 5; take with food  . [START ON 01/23/2019] morphine (MS CONTIN) 15 MG 12 hr tablet Take 1 tablet (15 mg total) by mouth daily for 30 days.  Melene Muller. [START ON 02/22/2019] morphine (MS CONTIN) 15 MG 12 hr tablet Take 1 tablet (15 mg total) by mouth daily for 30 days.  . naloxone (NARCAN) nasal spray 4 mg/0.1 mL Use in case of accidental overdose with narcotics / opioids  . [START ON 01/23/2019] oxyCODONE-acetaminophen (PERCOCET) 10-325 MG tablet Take 1 tablet by mouth every 8 (eight) hours as needed for up to 30 days for pain.  Melene Muller. [START ON 02/22/2019] oxyCODONE-acetaminophen (PERCOCET) 10-325 MG tablet Take 1 tablet by mouth every 8 (eight) hours as needed for up to 30 days for pain.  . [DISCONTINUED] prednisoLONE acetate (PRED FORTE) 1 % ophthalmic suspension Place 2 drops into both eyes 2 (two) times daily.  . [DISCONTINUED] Vitamin D, Ergocalciferol, (DRISDOL) 50000 units CAPS capsule Take 50,000 Units by mouth once a week.    No facility-administered encounter medications on file as of 01/16/2019.     Allergies (verified) Nsaids   History: Past Medical History:  Diagnosis Date  .  Anemia    of chronic renal disease  . Bone spur of other site    right shoulder, managed by ortho  . Chronic pain    [name] CFNP at [facility]  . CKD (chronic kidney disease)    stage III 03/2014 (Dr. Mosetta Pigeon)  . Controlled substance agreement signed    signed 06/02/15  . Gout   . Gouty arthritis    knee, managed by Ortho  . History of YAG laser capsulotomy of lens of left eye   . Hyperlipidemia   . Hypertension   . IFG (impaired fasting glucose)   . Left knee DJD   . 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 Primary localized osteoarthritis of right knee   . Smoker    Past Surgical History:  Procedure Laterality Date  . CARPAL TUNNEL RELEASE Left   . CATARACT EXTRACTION W/  INTRAOCULAR LENS IMPLANT Left 2008  . CATARACT EXTRACTION W/ INTRAOCULAR LENS IMPLANT Right 2008  . EYE SURGERY    . HERNIA REPAIR    . JOINT REPLACEMENT Left 03/2014  . TOTAL KNEE ARTHROPLASTY Left 03/30/2014   Procedure: TOTAL KNEE ARTHROPLASTY;  Surgeon: Nilda Simmer, MD;  Location: [facility] OR;  Service: Orthopedics;  Laterality: Left;  . TOTAL KNEE ARTHROPLASTY Right 10/26/2014   Procedure: TOTAL KNEE ARTHROPLASTY;  Surgeon: Nilda Simmer, MD;  Location: [facility] OR;  Service: Orthopedics;  Laterality: Right;  . UMBILICAL HERNIA REPAIR  2010   Family History  Problem Relation Age of Onset  . COPD Mother   . Kidney disease Mother   . Thyroid disease Mother   . Stroke Father 20  . COPD Maternal Grandmother    Social History   Socioeconomic History  . Marital status: Married    Spouse name: Not on file  . Number of children: 6  . Years of education: Not on file  . Highest education level: Bachelor's degree (e.g., BA, AB, BS)  Occupational History  . Not on file  Social Needs  . Financial resource strain: Not hard at all  . Food insecurity:    Worry: Never true    Inability: Never true  . Transportation needs:    Medical: No    Non-medical: No  Tobacco Use  . Smoking status: Current Every Day Smoker    Packs/day: 0.50    Years: 22.00    Pack years: 11.00    Types: Cigarettes  . Smokeless tobacco: Never Used  Substance and Sexual Activity  . Alcohol use: No  . Drug use: No  . Sexual activity: Yes  Lifestyle  . Physical activity:    Days per week: 0 days    Minutes per session: 0 min  . Stress: Not at all  Relationships  . Social connections:    Talks on phone: More than three times a week    Gets together: More than three times a week    Attends religious service: Never    Active member of club or organization: No    Attends meetings of clubs or organizations: Never    Relationship status: Married  Other Topics Concern  . Not on file  Social History Narrative  .  Not on file   Tobacco Counseling Ready to quit: No Counseling given: Not Answered   Clinical Intake:  Pre-visit preparation completed: Yes  Pain : 0-10 Pain Score: 5  Pain Type: Chronic pain Pain Location: Hand Pain Onset: More  than a month ago Pain Frequency: Constant     BMI - recorded: 36.32 Nutritional Status: BMI > 30  Obese Nutritional Risks: None Diabetes: No  How often do you need to have someone help you when you read instructions, pamphlets, or other written materials from your doctor or pharmacy?: 1 - Never  Interpreter Needed?: No  Information entered by :: Reather Littler LPN  Activities of Daily Living In your present state of health, do you have any difficulty performing the following activities: 01/16/2019 10/23/2018  Hearing? N N  Comment declines hearing aids -  Vision? N N  Difficulty concentrating or making decisions? N N  Walking or climbing stairs? N N  Dressing or bathing? N N  Doing errands, shopping? N N  Preparing Food and eating ? N -  Using the Toilet? N -  In the past six months, have you accidently leaked urine? N -  Do you have problems with loss of bowel control? N -  Managing your Medications? N -  Managing your Finances? N -  Housekeeping or managing your Housekeeping? N -  Some recent data might be hidden     Immunizations and Health Maintenance Immunization History  Administered Date(s) Administered  . DTaP 04/12/2010   There are no preventive care reminders to display for this patient.  Patient Care Team: Lada, Janit Bern, MD as PCP - General (Family Medicine) Mosetta Pigeon, MD (Internal Medicine)  Indicate any recent Medical Services you may have received from other than [facility] providers in the past year (date may be approximate).    Assessment:   This is a routine wellness examination for [place].  Hearing/Vision screen Hearing Screening Comments: Pt denies hearing difficulty  Vision Screening Comments: Annual eye  exams at [facility]  Dietary issues and exercise activities discussed: Current Exercise Habits: The patient does not participate in regular exercise at present, Exercise limited by: orthopedic condition(s)  Goals    . Increase physical activity     Recommend increasing physical activity to 150 minutes per week      Depression Screen PHQ 2/9 Scores 01/16/2019 12/05/2018 10/23/2018 09/17/2018  PHQ - 2 Score 0 0 0 0  PHQ- 9 Score - - 0 -    Fall Risk Fall Risk  01/16/2019 12/05/2018 10/23/2018 09/17/2018 06/18/2018  Falls in the past year? 0 0 0 0 No  Number falls in past yr: 0 0 - - -  Injury with Fall? 0 0 - - -  Follow up Falls prevention discussed - - - -    FALL RISK PREVENTION PERTAINING TO THE HOME:  Any stairs in or around the home? Yes  If so, do they handrails? Yes   Home free of loose throw rugs in walkways, pet beds, electrical cords, etc? Yes  Adequate lighting in your home to reduce risk of falls? Yes   ASSISTIVE DEVICES UTILIZED TO PREVENT FALLS:  Life alert? No  Use of a cane, walker or w/c? No  Grab bars in the bathroom? No  Shower chair or bench in shower? No  Elevated toilet seat or a handicapped toilet? No   DME ORDERS:  DME order needed?  No   TIMED UP AND GO:  Was the test performed? Yes .  Length of time to ambulate 10 feet: 6 sec.   GAIT:  Appearance of gait: Gait stead-fast and without the use of an assistive device.   Education: Fall risk prevention has been discussed.  Intervention(s) required? No  Cognitive Function:     6CIT Screen 01/16/2019  What Year? 0 points  What month? 0 points  What time? 0 points  Count back from 20 0 points  Months in reverse 0 points  Repeat phrase 0 points  Total Score 0    Screening Tests Health Maintenance  Topic Date Due  . INFLUENZA VACCINE  02/10/2019 (Originally 06/13/2018)  . TETANUS/TDAP  04/12/2020  . HIV Screening  Completed    Qualifies for Shingles Vaccine? YES  Due for  Shingrix. Education has been provided regarding the importance of this vaccine. Pt has been advised to call insurance company to determine out of pocket expense. Advised may also receive vaccine at local pharmacy or Health Dept. Verbalized acceptance and understanding.  Tdap: Up to date  Flu Vaccine: Due for Flu vaccine. Does the patient want to receive this vaccine today?  No . Education has been provided regarding the importance of this vaccine but still declined. Advised may receive this vaccine at local pharmacy or Health Dept. Aware to provide a copy of the vaccination record if obtained from local pharmacy or Health Dept. Verbalized acceptance and understanding.  Pneumococcal Vaccine: Due for Pneumococcal vaccine. Does the patient want to receive this vaccine today?  No . Education has been provided regarding the importance of this vaccine but still declined. Advised may receive this vaccine at local pharmacy or Health Dept. Aware to provide a copy of the vaccination record if obtained from local pharmacy or Health Dept. Verbalized acceptance and understanding.   Cancer Screenings:  Colorectal Screening: due at age 60  Lung Cancer Screening: (Low Dose CT Chest recommended if Age 39-80 years, 30 pack-year currently smoking OR have quit w/in 15years.) does not qualify.   Additional Screening:  Hepatitis C Screening: does qualify; ordered today  Vision Screening: Recommended annual ophthalmology exams for early detection of glaucoma and other disorders of the eye. Is the patient up to date with their annual eye exam?  Yes  Who is the provider or what is the name of the office in which the pt attends annual eye exams? [facility]  Dental Screening: Recommended annual dental exams for proper oral hygiene  Community Resource Referral:  CRR required this visit?  No       Plan:       I have personally reviewed and addressed the Medicare Annual Wellness questionnaire and have  noted the following in the patient's chart:  A. Medical and social history B. Use of alcohol, tobacco or illicit drugs  C. Current medications and supplements D. Functional ability and status E.  Nutritional status F.  Physical activity G. Advance directives H. List of other physicians I.  Hospitalizations, surgeries, and ER visits in previous 12 months J.  Vitals K. Screenings such as hearing and vision if needed, cognitive and depression L. Referrals and appointments   In addition, I have reviewed and discussed with patient certain preventive protocols, quality metrics, and best practice recommendations. A written personalized care plan for preventive services as well as general preventive health recommendations were provided to patient.   Signed,  Reather Littler, LPN Nurse Health Advisor   Nurse Notes:

## 2019-02-14 ENCOUNTER — Telehealth: Payer: Self-pay | Admitting: Family Medicine

## 2019-02-14 ENCOUNTER — Ambulatory Visit: Payer: Self-pay | Admitting: *Deleted

## 2019-02-14 NOTE — Telephone Encounter (Signed)
Copied from CRM [id number]. Topic: Quick Communication - Rx Refill/Question >> Feb 14, 2019  4:18 PM Angela Nevin wrote: Medication: Febuxostat (ULORIC) 80 MG TABS  Patient is requesting a refill of this medication.   Preferred Pharmacy (with phone number or street name): [id number] - [place], [place] - [address] AT [facility] OF [address] [phone] (Phone) [phone] (Fax)

## 2019-02-14 NOTE — Telephone Encounter (Signed)
Message from Angela Nevin sent at 02/14/2019 4:20 PM EDT   Summary: pink eye?   Patient requesting call back from nurse as he thinks he may have pink eye. He states that his left eye has been crusty, watery and red.         Returned to patient regarding his eye. He states he is pretty sure that he has pink eye. His left eye is swollen and draining, has crusty areas. It is painful all the time to touch and light bothers him. He denies fever. Thought it was allergies and took allergy medications buy that has not helped. His eye has been swollen for the past 32 days. Advised putting a cold compress to his eye. When he is touching his eye he needs to wear gloves and wash hand good. He voiced understanding. He has 6 kids with him. Advised to do an E visit regarding his symptoms. He also check with an urgent care regarding his symtoms. Routing to flow at [facility]. Advised to call back for increase in symptoms including fever, with verbal understanding.  Reason for Disposition . MODERATE-SEVERE eyelid swelling on one side  (Exception: due to a mosquito bite)  Answer Assessment - Initial Assessment Questions 1. ONSET: "When did the swelling start?" (e.g., minutes, hours, days)     3 days 2. LOCATION: "What part of the eyelids is swollen?"     Upper lid 3. SEVERITY: "How swollen is it?"     Almost shut 4. ITCHING: "Is there any itching?" If so, ask: "How much?"   (Scale 1-10; mild, moderate or severe)     Itches at times 5. PAIN: "Is the swelling painful to touch?" If so, ask: "How painful is it?"   (Scale 1-10; mild, moderate or severe)     Touching and without 6. FEVER: "Do you have a fever?" If so, ask: "What is it, how was it measured, and when did it start?"      no 7. CAUSE: "What do you think is causing the swelling?"     Pink eye 8. RECURRENT SYMPTOM: "Have you had eyelid swelling before?" If so, ask: "When was the last time?" "What happened that time?"     Yes  as a child 9. OTHER SYMPTOMS: "Do you have any other symptoms?" (e.g., blurred vision, eye discharge, rash, runny nose)     Eye discharge,  10. PREGNANCY: "Is there any chance you are pregnant?" "When was your last menstrual period?"       n/a  Protocols used: EYE - [facility]

## 2019-02-17 NOTE — Telephone Encounter (Signed)
Left detailed VM notifying patient to contact prescriber for Uloric and ask them to fill.

## 2019-02-17 NOTE — Telephone Encounter (Signed)
Please check the medication history We do not fill his Uloric here He needs to contact the doctor who prescribes that Thank you

## 2019-02-17 NOTE — Telephone Encounter (Signed)
Left detailed VM advising patient to call office and schedule e visit.

## 2019-02-17 NOTE — Telephone Encounter (Signed)
I agree with e-visit or he could also do telehealth

## 2019-03-06 ENCOUNTER — Telehealth: Payer: Self-pay | Admitting: *Deleted

## 2019-03-06 ENCOUNTER — Ambulatory Visit
Payer: Medicare Other | Attending: Student in an Organized Health Care Education/Training Program | Admitting: Student in an Organized Health Care Education/Training Program

## 2019-03-06 ENCOUNTER — Other Ambulatory Visit: Payer: Self-pay

## 2019-03-06 DIAGNOSIS — G894 Chronic pain syndrome: Secondary | ICD-10-CM | POA: Diagnosis not present

## 2019-03-06 DIAGNOSIS — M170 Bilateral primary osteoarthritis of knee: Secondary | ICD-10-CM

## 2019-03-06 DIAGNOSIS — M1A39X1 Chronic gout due to renal impairment, multiple sites, with tophus (tophi): Secondary | ICD-10-CM | POA: Diagnosis not present

## 2019-03-06 DIAGNOSIS — N183 Chronic kidney disease, stage 3 unspecified: Secondary | ICD-10-CM

## 2019-03-06 DIAGNOSIS — Q615 Medullary cystic kidney: Secondary | ICD-10-CM | POA: Diagnosis not present

## 2019-03-06 DIAGNOSIS — Z96653 Presence of artificial knee joint, bilateral: Secondary | ICD-10-CM

## 2019-03-06 MED ORDER — MORPHINE SULFATE ER 15 MG PO TBCR: 15.0000 mg | EXTENDED_RELEASE_TABLET | Freq: Every day | ORAL | 0 refills | Status: DC

## 2019-03-06 MED ORDER — OXYCODONE-ACETAMINOPHEN 10-325 MG PO TABS: 1.0000 | ORAL_TABLET | Freq: Three times a day (TID) | ORAL | 0 refills | Status: DC | PRN

## 2019-03-06 NOTE — Progress Notes (Signed)
Pain Management Virtual Encounter Note - Virtual Visit via Video Conference Telehealth (real-time audio visits between healthcare provider and patient).  Patient's Phone No. & Preferred Pharmacy:  [phone] (home); [phone] (mobile); (Preferred) [phone] No e-mail address on record  [facility] #[id number] [facility], [place] - [address] AT [facility] OF [address] [place] [address] [place] [address] Phone: [phone] Fax: [phone]   Pre-screening note:  Our staff contacted Nathan Ramos and offered him an "in person", "face-to-face" appointment versus a telephone encounter. He indicated preferring the telephone encounter, at this time.  Reason for Virtual Visit: COVID-19*  Social distancing based on CDC and AMA recommendations.   I contacted Nathan Ramos on 03/06/2019 at 1:40 PM via video conference and clearly identified myself as [name], MD. I verified that I was speaking with the correct person using two identifiers (Name and date of birth: 1971/08/27).  Advanced Informed Consent I sought verbal advanced consent from Nathan Ramos for virtual visit interactions. I informed Nathan Ramos of possible security and privacy concerns, risks, and limitations associated with providing "not-in-person" medical evaluation and management services. I also informed Nathan Ramos of the availability of "in-person" appointments. Finally, I informed him that there would be a charge for the virtual visit and that he could be  personally, fully or partially, financially responsible for it. Nathan Ramos expressed understanding and agreed to proceed.   Historic Elements   Nathan Ramos is a 48 y.o. year old, male patient evaluated today after his last encounter by our practice on 12/05/2018. Nathan Ramos  has a past medical history of Anemia, Bone spur of other site, Chronic pain, CKD (chronic kidney disease), Controlled substance agreement signed, Gout, Gouty arthritis, History of YAG  laser capsulotomy of lens of left eye, Hyperlipidemia, Hypertension, IFG (impaired fasting glucose), Left knee DJD, Medullary cystic disease of the kidney, Primary localized osteoarthritis of right knee, and Smoker. 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 Nathan Ramos has a current medication list which includes the following prescription(s): amlodipine, atorvastatin, colchicine, febuxostat, furosemide, losartan, morphine, naloxone, oxycodone-acetaminophen, morphine, morphine, oxycodone-acetaminophen, and oxycodone-acetaminophen. He  reports that he has been smoking cigarettes. He has a 11.00 pack-year smoking history. He has never used smokeless tobacco. He reports that he does not drink alcohol or use drugs. Nathan Ramos is allergic to nsaids.   HPI  I last saw him on 12/05/2018. He is being evaluated for medication management.   Pharmacotherapy Assessment  Analgesic:morphine 7.5 mg twice daily as needed (MME equals 15), Percocet at current dose of 10 mg 3 times daily as needed, quantity 90 (MME equals 45) MME= 60 mg/day.   02/22/2019  1   12/05/2018  Oxycodone-Acetaminophen 10-325  90.00 30 Bi Lat   [id number]   [facility] (5798)   0  45.00 MME  Comm Ins   [place]  02/22/2019  1   12/05/2018  Morphine Sulf Er 15 MG Tablet  30.00 30 Bi Lat   [id number]   Wal (5798)   0  15.00 MME  Comm Ins   [place]     Monitoring: Pharmacotherapy: No side-effects or adverse reactions reported. [place] PMP: PDMP reviewed during this encounter.  Compliance: No problems identified or detected. Plan: Refer to "POC".  Review of recent tests  DG Hand Complete Right CLINICAL DATA:  Hand  pain  EXAM: RIGHT HAND - COMPLETE 3+ VIEW  COMPARISON:  None.  FINDINGS: Negative for acute fracture.  Advanced degenerative change in the radiocarpal joint with joint space narrowing and spurring. Degenerative change in the distal radioulnar joint also is advanced. Interphalangeal joints intact. No erosion.  IMPRESSION: Advanced degenerative change in the radiocarpal joint and distal radioulnar joint. No acute abnormality.  Electronically Signed   By: Marlan Palau M.D.   On: 07/03/2018 13:01   Office Visit on 10/23/2018  Component Date Value Ref Range Status  . Cholesterol 10/23/2018 179  <200 mg/dL Final  . HDL 81/19/1478 26* >40 mg/dL Final  . Triglycerides 10/23/2018 481* <150 mg/dL Final   Comment: . If a non-fasting specimen was collected, consider repeat triglyceride testing on a fasting specimen if clinically indicated.  Perry Mount et al. J. of Clin. Lipidol. 2015;9:129-169. .   . LDL Cholesterol (Calc) 10/23/2018   mg/dL (calc) Final   Comment: . LDL cholesterol not calculated. Triglyceride levels greater than 400 mg/dL invalidate calculated LDL results. . Reference range: <100 . Desirable range <100 mg/dL for primary prevention;   <70 mg/dL for patients with CHD or diabetic patients  with > or = 2 CHD risk factors. Marland Kitchen LDL-C is now calculated using the Martin-Hopkins  calculation, which is a validated novel method providing  better accuracy than the Friedewald equation in the  estimation of LDL-C.  Horald Pollen et al. Lenox Ahr. 2956;213(08): 2061-2068  (http://education.QuestDiagnostics.com/faq/FAQ164)   . Total CHOL/HDL Ratio 10/23/2018 6.9* <6.5 (calc) Final  . Non-HDL Cholesterol (Calc) 10/23/2018 153* <130 mg/dL (calc) Final   Comment: For patients with diabetes plus 1 major ASCVD risk  factor, treating to a non-HDL-C goal of <100 mg/dL  (LDL-C of <78 mg/dL) is considered a therapeutic  option.   . Hgb A1c MFr Bld 10/23/2018 5.6  <5.7 % of total Hgb  Final   Comment: For the purpose of screening for the presence of diabetes: . <5.7%       Consistent with the absence of diabetes 5.7-6.4%    Consistent with increased risk for diabetes             (prediabetes) > or =6.5%  Consistent with diabetes . This assay result is consistent with a decreased risk of diabetes. . Currently, no consensus exists regarding use of hemoglobin A1c for diagnosis of diabetes in children. . According to American Diabetes Association (ADA) guidelines, hemoglobin A1c <7.0% represents optimal control in non-pregnant diabetic patients. Different metrics may apply to specific patient populations.  Standards of Medical Care in Diabetes(ADA). .   . Mean Plasma Glucose 10/23/2018 114  (calc) Final  . eAG (mmol/L) 10/23/2018 6.3  (calc) Final  . Total Protein 10/23/2018 7.9  6.1 - 8.1 g/dL Final  . Albumin 46/96/2952 4.6  3.6 - 5.1 g/dL Final  . Globulin 84/13/2440 3.3  1.9 - 3.7 g/dL (calc) Final  . AG Ratio 10/23/2018 1.4  1.0 - 2.5 (calc) Final  . Total Bilirubin 10/23/2018 0.4  0.2 - 1.2 mg/dL Final  . Bilirubin, Direct 10/23/2018 0.1  0.0 - 0.2 mg/dL Final  . Indirect Bilirubin 10/23/2018 0.3  0.2 - 1.2 mg/dL (calc) Final  . Alkaline phosphatase (APISO) 10/23/2018 89  40 - 115 U/L Final  . AST 10/23/2018 19  10 - 40 U/L Final  . ALT 10/23/2018 17  9 - 46 U/L Final   Assessment  The primary encounter diagnosis was Chronic pain syndrome. 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  Plan of Care  I have discontinued Nathan Ramos's predniSONE. I am also having him start on oxyCODONE-acetaminophen, oxyCODONE-acetaminophen, morphine, and morphine. Additionally, I am having him maintain his Febuxostat, losartan,  furosemide, amLODipine, naloxone, atorvastatin, colchicine, oxyCODONE-acetaminophen, and morphine.  Pharmacotherapy (Medications Ordered): Meds ordered this encounter  Medications  . oxyCODONE-acetaminophen (PERCOCET) 10-325 MG tablet    Sig: Take 1 tablet by mouth every 8 (eight) hours as needed for up to 30 days for pain.    Dispense:  90 tablet    Refill:  0    Do not place this medication, or any other prescription from our practice, on "Automatic Refill". Patient may have prescription filled one day early if pharmacy is closed on scheduled refill date.  Marland Kitchen oxyCODONE-acetaminophen (PERCOCET) 10-325 MG tablet    Sig: Take 1 tablet by mouth every 8 (eight) hours as needed for up to 30 days for pain. Must last 30 days.    Dispense:  90 tablet    Refill:  0    [place] STOP ACT - Not applicable. Fill one day early if pharmacy is closed on scheduled refill date.  Marland Kitchen oxyCODONE-acetaminophen (PERCOCET) 10-325 MG tablet    Sig: Take 1 tablet by mouth every 8 (eight) hours as needed for up to 30 days for pain. Must last 30 days.    Dispense:  90 tablet    Refill:  0    [place] STOP ACT - Not applicable. Fill one day early if pharmacy is closed on scheduled refill date.  . morphine (MS CONTIN) 15 MG 12 hr tablet    Sig: Take 1 tablet (15 mg total) by mouth daily for 30 days.    Dispense:  30 tablet    Refill:  0    Do not place this medication, or any other prescription from our practice, on "Automatic Refill". Patient may have prescription filled one day early if pharmacy is closed on scheduled refill date.  . morphine (MS CONTIN) 15 MG 12 hr tablet    Sig: Take 1 tablet (15 mg total) by mouth daily for 30 days. Must last 30 days.    Dispense:  30 tablet    Refill:  0    [place] STOP ACT - Not applicable. Fill one day early if pharmacy is closed on scheduled refill date.  . morphine (MS CONTIN) 15 MG 12 hr tablet    Sig: Take 1 tablet (15 mg total) by mouth daily for 30 days. Must last 30 days.    Dispense:   30 tablet    Refill:  0    [place] STOP ACT - Not applicable. Fill one day early if pharmacy is closed on scheduled refill date.   Orders:  No orders of the defined types were placed in this encounter.  Follow-up plan:   Return in about 3 months (around 06/05/2019) for Medication Management.   I discussed the assessment and treatment plan with the patient. The patient was provided an opportunity to ask questions and all were answered. The patient agreed with the plan and demonstrated an understanding of the instructions.  Patient advised to call back or seek  an in-person evaluation if the symptoms or condition worsens.  Total duration of non-face-to-face encounter: 25minutes.  Note by: Edward JollyBilal Loreen Bankson, MD Date: 03/06/2019; Time: 1:40 PM  Disclaimer:  * Given the special circumstances of the COVID-19 pandemic, the federal government has announced that the Office for Civil Rights (OCR) will exercise its enforcement discretion and will not impose penalties on physicians using telehealth in the event of noncompliance with regulatory requirements under the Health Insurance Portability and Accountability Act (HIPAA) in connection with the good faith provision of telehealth during the COVID-19 national public health emergency. (AMA)

## 2019-04-21 ENCOUNTER — Ambulatory Visit: Payer: Medicare Other | Admitting: Family Medicine

## 2019-06-04 ENCOUNTER — Encounter: Payer: Self-pay | Admitting: Student in an Organized Health Care Education/Training Program

## 2019-06-05 ENCOUNTER — Ambulatory Visit
Payer: Medicare Other | Attending: Student in an Organized Health Care Education/Training Program | Admitting: Student in an Organized Health Care Education/Training Program

## 2019-06-05 ENCOUNTER — Other Ambulatory Visit: Payer: Self-pay

## 2019-06-05 ENCOUNTER — Encounter: Payer: Self-pay | Admitting: Student in an Organized Health Care Education/Training Program

## 2019-06-05 DIAGNOSIS — M170 Bilateral primary osteoarthritis of knee: Secondary | ICD-10-CM | POA: Diagnosis not present

## 2019-06-05 DIAGNOSIS — Q615 Medullary cystic kidney: Secondary | ICD-10-CM

## 2019-06-05 DIAGNOSIS — N183 Chronic kidney disease, stage 3 unspecified: Secondary | ICD-10-CM

## 2019-06-05 DIAGNOSIS — G894 Chronic pain syndrome: Secondary | ICD-10-CM | POA: Diagnosis not present

## 2019-06-05 DIAGNOSIS — M1A39X1 Chronic gout due to renal impairment, multiple sites, with tophus (tophi): Secondary | ICD-10-CM

## 2019-06-05 DIAGNOSIS — Z96653 Presence of artificial knee joint, bilateral: Secondary | ICD-10-CM

## 2019-06-05 MED ORDER — MORPHINE SULFATE ER 15 MG PO TBCR: 15.0000 mg | EXTENDED_RELEASE_TABLET | Freq: Every day | ORAL | 0 refills | Status: DC

## 2019-06-05 MED ORDER — OXYCODONE-ACETAMINOPHEN 10-325 MG PO TABS: 1.0000 | ORAL_TABLET | Freq: Three times a day (TID) | ORAL | 0 refills | Status: DC | PRN

## 2019-06-05 NOTE — Progress Notes (Signed)
Pain Management Virtual Encounter Note - Virtual Visit via Telephone Telehealth (real-time audio visits between healthcare provider and patient).   Patient's Phone No. & Preferred Pharmacy:  [phone] (home); [phone] (mobile); (Preferred) [phone] No e-mail address on record  [id number] [facility] [place], [place] - [address] AT [facility] [address] [place] [address] [place] [address] Phone: [phone] Fax: [phone]    Pre-screening note:  Our staff contacted [name] and offered him an "in person", "face-to-face" appointment versus a telephone encounter. He indicated preferring the telephone encounter, at this time.   Reason for Virtual Visit: COVID-19*  Social distancing based on CDC and AMA recommendations.   I contacted Nathan Ramos Ramos on 06/05/2019 via telephone.      I clearly identified myself as Nathan JollyBilal Laylana Gerwig, MD. I verified that I was speaking with the correct person using two identifiers (Name: Nathan Ramos Ring, and date of birth: 07/03/1971).  Advanced Informed Consent I sought verbal advanced consent from Nathan Ramos Monsivais for virtual visit interactions. I informed [name] of possible security and privacy concerns, risks, and limitations associated with providing "not-in-person" medical evaluation and management services. I also informed [name] of the availability of "in-person" appointments. Finally, I informed him that there would be a charge for the virtual visit and that he could be  personally, fully or partially, financially responsible for it. [name] expressed understanding and agreed to proceed.   Historic Elements   [name] Quiett is a 48 y.o. year old, male patient evaluated today after his last encounter by our practice on 03/06/2019. [name]  has a past medical history of Anemia, Bone spur of other site, Chronic pain, CKD (chronic kidney disease), Controlled substance agreement signed, Gout, Gouty arthritis, History of YAG  laser capsulotomy of lens of left eye, Hyperlipidemia, Hypertension, IFG (impaired fasting glucose), Left knee DJD, Medullary cystic disease of the kidney, Primary localized osteoarthritis of right knee, and Smoker. 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 [name] has a current medication list which includes the following prescription(s): amlodipine, atorvastatin, colchicine, febuxostat, furosemide, losartan, morphine, morphine, morphine, naloxone, oxycodone-acetaminophen, oxycodone-acetaminophen, and oxycodone-acetaminophen. He  reports that he has been smoking cigarettes. He has a 11.00 pack-year smoking history. He has never used smokeless tobacco. He reports that he does not drink alcohol or use drugs. [name] is allergic to nsaids.   HPI  Today, he is being contacted for medication management.   No change in medical history since last visit.  Patient's pain is at baseline.  Patient continues multimodal pain regimen as prescribed.  States that it provides pain relief and improvement in functional status.   Pharmacotherapy Assessment   05/22/2019  1   03/06/2019  Morphine Sulf Er 15 MG Tablet  30.00 30 Bi Lat   [id number]   Wal (5798)   0  15.00 MME  Comm Ins   [place]  05/22/2019  1   03/06/2019  Oxycodone-Acetaminophen 10-325  90.00 30 Bi Lat   [id number]   Wal (5798)   0  45.00 MME  Comm Ins   [place]    Monitoring: Pharmacotherapy: No side-effects or adverse reactions reported. [place] PMP: PDMP  reviewed during this encounter.       Compliance: No problems identified. Effectiveness: Clinically acceptable. Plan: Refer to "POC".  Pertinent Labs   SAFETY  SCREENING Profile Lab Results  Component Value Date   STAPHAUREUS NEGATIVE 10/16/2014   MRSAPCR NEGATIVE 10/16/2014   Renal Function Lab Results  Component Value Date   BUN 61 (H) 04/23/2018   CREATININE 2.58 (H) 04/23/2018   BCR 24 (H) 04/23/2018   GFRAA 34 (Ramos) 04/23/2018   GFRNONAA 29 (Ramos) 04/23/2018   Hepatic Function Lab Results  Component Value Date   AST 19 10/23/2018   ALT 17 10/23/2018   ALBUMIN 4.6 10/16/2017   UDS Summary  Date Value Ref Range Status  09/17/2018 FINAL  Final    Comment:    ==================================================================== TOXASSURE SELECT 13 (MW) ==================================================================== Test                             Result       Flag       Units Drug Present and Declared for Prescription Verification   Morphine                       3725         EXPECTED   ng/mg creat   Normorphine                    97           EXPECTED   ng/mg creat    Potential sources of large amounts of morphine in the absence of    codeine include administration of morphine or use of heroin.    Normorphine is an expected metabolite of morphine.   Oxycodone                      744          EXPECTED   ng/mg creat   Oxymorphone                    2835         EXPECTED   ng/mg creat   Noroxycodone                   565          EXPECTED   ng/mg creat   Noroxymorphone                 398          EXPECTED   ng/mg creat    Sources of oxycodone are scheduled prescription medications.    Oxymorphone, noroxycodone, and noroxymorphone are expected    metabolites of oxycodone. Oxymorphone is also available as a    scheduled prescription medication. ==================================================================== Test                      Result    Flag   Units      Ref Range   Creatinine              108              mg/dL      >=16>=20 ==================================================================== Declared Medications:   The flagging and interpretation on this report are based on the  following declared medications.  Unexpected results may arise from  inaccuracies in the declared medications.  **Note: The testing scope of this panel  includes these medications:  Morphine (MS Contin)  Oxycodone (Percocet)  **Note: The testing scope of this panel does not include following  reported medications:  Acetaminophen (Percocet)  Amlodipine (Norvasc)  Atorvastatin (Lipitor)  Febuxostat  Furosemide (Lasix)  Losartan (Cozaar)  Naloxone (Narcan)  Prednisolone  Prednisone (Deltasone)  Vitamin D2 (Drisdol) ==================================================================== For clinical consultation, please call [phone]. ====================================================================    Note: Above Lab results reviewed.  Recent imaging  DG Hand Complete Right CLINICAL DATA:  Hand pain  EXAM: RIGHT HAND - COMPLETE 3+ VIEW  COMPARISON:  None.  FINDINGS: Negative for acute fracture.  Advanced degenerative change in the radiocarpal joint with joint space narrowing and spurring. Degenerative change in the distal radioulnar joint also is advanced. Interphalangeal joints intact. No erosion.  IMPRESSION: Advanced degenerative change in the radiocarpal joint and distal radioulnar joint. No acute abnormality.  Electronically Signed   By: Franchot Gallo M.D.   On: 07/03/2018 13:01  Assessment  The primary encounter diagnosis was Chronic pain syndrome. 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  Plan of Care  I have changed Kamen Ramos. [place]'s morphine and oxyCODONE-acetaminophen. I am also having him start on morphine, morphine, oxyCODONE-acetaminophen,  and oxyCODONE-acetaminophen. Additionally, I am having him maintain his Febuxostat, losartan, furosemide, amLODipine, naloxone, atorvastatin, and colchicine.   General Recommendations: The pain condition that the patient suffers from is best treated with a multidisciplinary approach that involves an increase in physical activity to prevent de-conditioning and worsening of the pain cycle, as well as psychological counseling (formal and/or informal) to address the co-morbid psychological affects of pain. Treatment will often involve judicious use of pain medications and interventional procedures to decrease the pain, allowing the patient to participate in the physical activity that will ultimately produce long-lasting pain reductions. The goal of the multidisciplinary approach is to return the patient to a higher level of overall function and to restore their ability to perform activities of daily living.    Pharmacotherapy (Medications Ordered): Meds ordered this encounter  Medications  . morphine (MS CONTIN) 15 MG 12 hr tablet    Sig: Take 1 tablet (15 mg total) by mouth daily. Must last 30 days.    Dispense:  30 tablet    Refill:  0    [place] STOP ACT - Not applicable. Fill one day early if pharmacy is closed on scheduled refill date.  Marland Kitchen oxyCODONE-acetaminophen (PERCOCET) 10-325 MG tablet    Sig: Take 1 tablet by mouth every 8 (eight) hours as needed for pain. Must last 30 days.    Dispense:  90 tablet    Refill:  0    [place] STOP ACT - Not applicable. Fill one day early if pharmacy is closed on scheduled refill date.  . morphine (MS CONTIN) 15 MG 12 hr tablet    Sig: Take 1 tablet (15 mg total) by mouth daily. Must last 30 days.    Dispense:  30 tablet    Refill:  0    [place] STOP ACT - Not applicable. Fill one day early if pharmacy is closed on scheduled refill date.  . morphine (MS CONTIN) 15 MG 12 hr tablet    Sig: Take 1 tablet (15 mg total) by mouth daily. Must last 30 days.    Dispense:  30  tablet    Refill:  0    [place] STOP ACT - Not applicable. Fill one day early if pharmacy is closed on scheduled refill date.  Marland Kitchen. oxyCODONE-acetaminophen (PERCOCET) 10-325 MG tablet    Sig: Take 1 tablet by mouth every 8 (eight) hours as needed for pain. Must last 30 days.    Dispense:  90 tablet    Refill:  0    [place] STOP ACT - Not applicable. Fill one day early if pharmacy is closed on scheduled refill date.  Marland Kitchen. oxyCODONE-acetaminophen (PERCOCET) 10-325 MG tablet    Sig: Take 1 tablet by mouth every 8 (eight) hours as needed for pain. Must last 30 days.    Dispense:  90 tablet    Refill:  0    [place] STOP ACT - Not applicable. Fill one day early if pharmacy is closed on scheduled refill date.   Orders:  No orders of the defined types were placed in this encounter.  Follow-up plan:   Return in about 3 months (around 09/05/2019) for Medication Management (UDS at next visit) .   Recent Visits No visits were found meeting these conditions.  Showing recent visits within past 90 days and meeting all other requirements   Today's Visits Date Type Provider Dept  06/05/19 Office Visit Nathan JollyLateef, Rionna Feltes, MD [facility]  Showing today's visits and meeting all other requirements   Future Appointments No visits were found meeting these conditions.  Showing future appointments within next 90 days and meeting all other requirements   I discussed the assessment and treatment plan with the patient. The patient was provided an opportunity to ask questions and all were answered. The patient agreed with the plan and demonstrated an understanding of the instructions.  Patient advised to call back or seek an in-person evaluation if the symptoms or condition worsens.  Total duration of non-face-to-face encounter:7125minutes.  Note by: Nathan JollyBilal Coretta Leisey, MD Date: 06/05/2019; Time: 11:56 AM  Note: This dictation was prepared with Dragon dictation. Any transcriptional errors that may result from this process are  unintentional.  Disclaimer:  * Given the special circumstances of the COVID-19 pandemic, the federal government has announced that the Office for Civil Rights (OCR) will exercise its enforcement discretion and will not impose penalties on physicians using telehealth in the event of noncompliance with regulatory requirements under the [employer] Insurance Portability and Accountability Act (HIPAA) in connection with the good faith provision of telehealth during the COVID-19 national public health emergency. (AMA)

## 2019-06-10 ENCOUNTER — Other Ambulatory Visit: Payer: Self-pay | Admitting: Student in an Organized Health Care Education/Training Program

## 2019-06-11 ENCOUNTER — Telehealth: Payer: Self-pay

## 2019-06-11 MED ORDER — PREDNISONE 20 MG PO TABS: ORAL_TABLET | ORAL | 0 refills | Status: AC

## 2019-06-11 NOTE — Telephone Encounter (Signed)
He wants to know if Dr. Holley Raring will call out a prednisone prescription.

## 2019-06-11 NOTE — Telephone Encounter (Signed)
Asking due to gout flare-up.

## 2019-06-11 NOTE — Telephone Encounter (Signed)
Patient notified script sent to phamacy.

## 2019-08-20 ENCOUNTER — Other Ambulatory Visit: Payer: Self-pay

## 2019-09-04 ENCOUNTER — Encounter: Payer: Medicare Other | Admitting: Student in an Organized Health Care Education/Training Program

## 2019-09-10 ENCOUNTER — Other Ambulatory Visit: Payer: Self-pay

## 2019-09-10 ENCOUNTER — Ambulatory Visit
Payer: Medicare Other | Attending: Student in an Organized Health Care Education/Training Program | Admitting: Student in an Organized Health Care Education/Training Program

## 2019-09-10 ENCOUNTER — Encounter: Payer: Self-pay | Admitting: Student in an Organized Health Care Education/Training Program

## 2019-09-10 VITALS — BP 185/101 | HR 103 | Temp 98.4°F | Ht 70.0 in | Wt 253.0 lb

## 2019-09-10 DIAGNOSIS — M170 Bilateral primary osteoarthritis of knee: Secondary | ICD-10-CM | POA: Insufficient documentation

## 2019-09-10 DIAGNOSIS — G894 Chronic pain syndrome: Secondary | ICD-10-CM | POA: Insufficient documentation

## 2019-09-10 DIAGNOSIS — Q615 Medullary cystic kidney: Secondary | ICD-10-CM | POA: Diagnosis not present

## 2019-09-10 DIAGNOSIS — Z96653 Presence of artificial knee joint, bilateral: Secondary | ICD-10-CM | POA: Diagnosis not present

## 2019-09-10 MED ORDER — OXYCODONE-ACETAMINOPHEN 10-325 MG PO TABS: 1.0000 | ORAL_TABLET | Freq: Three times a day (TID) | ORAL | 0 refills | Status: DC | PRN

## 2019-09-10 MED ORDER — MORPHINE SULFATE ER 15 MG PO TBCR: 15.0000 mg | EXTENDED_RELEASE_TABLET | Freq: Every day | ORAL | 0 refills | Status: DC

## 2019-09-10 NOTE — Progress Notes (Signed)
Patient's Name: Nathan Ramos  MRN: [id number]  Referring Provider: Arnetha Courser, MD  DOB: 08/12/1971  PCP: Arnetha Courser, MD  DOS: 09/10/2019  Note by: Gillis Santa, MD  Service setting: Ambulatory outpatient  Attending: Gillis Santa, MD  Location: [facility] (AMB) Pain Management Facility  Specialty: Interventional Pain Management  Patient type: Established   Primary Reason(s) for Visit: Encounter for prescription drug management. (Level of risk: moderate)  CC: Hand Pain  HPI  [name] is a 48 y.o. year old, male patient, who comes today for a medication management evaluation. He has Medullary cystic disease of the kidney; Left knee DJD; Chronic pain; Hypertension; Gout due to renal impairment; DJD (degenerative joint disease) of knee; Primary localized osteoarthritis of right knee; Chronic kidney disease, stage III (moderate) ([place]); Anemia; Hypertriglyceridemia; Hx of impaired glucose tolerance; Gouty arthritis; Controlled substance agreement signed; Wrist pain, right; Medication monitoring encounter; Trigger ring finger of right hand; Obesity; Tobacco abuse; Chronic pain syndrome; Vitamin D deficiency; Opioid use; Low HDL (under 40); Hyperglycemia; and Morbid obesity ([place]) on their problem list. His primarily concern today is the Hand Pain  Pain Assessment: Location: Right Hand Radiating: Denies Onset: More than a month ago Duration: Chronic pain Quality: Aching Severity: 6 /10 (subjective, self-reported pain score)   Effect on ADL: limits my daily activities Timing: Constant Modifying factors: mediccations BP: (!) 185/101  HR: (!) 103  [name] was last scheduled for an appointment on 06/10/2019 for medication management. During today's appointment we reviewed Mr. Ruhe chronic pain status, as well as his outpatient medication regimen.  No change in medical history since last visit.  Patient's pain is at baseline.  Patient continues multimodal pain regimen as prescribed.  States  that it provides pain relief and improvement in functional status. States that he is fairly stressed out at home having to homeschool his 3 kids 1 of which has ADD.  This is contributing to a lot of stress and anxiety for him.  The patient  reports no history of drug use. His body mass index is 36.3 kg/m.  Further details on both, my assessment(s), as well as the proposed treatment plan, please see below.  Controlled Substance Pharmacotherapy Assessment REMS (Risk Evaluation and Mitigation Strategy)  Analgesic:  08/20/2019  1   06/05/2019  Oxycodone-Acetaminophen 10-325  90.00  30 Bi Lat   [id number]   Wal ([id number])   0  45.00 MME  Comm Ins   [place]  08/20/2019  1   06/05/2019  Morphine Sulf Er 15 MG Tablet  30.00  30 Bi Lat   [id number]   Wal ([id number])   0  15.00 MME  Comm Ins   [place]    Chauncey Fischer, RN  09/10/2019  2:05 PM  Sign when Signing Visit Nursing Pain Medication Assessment:  Safety precautions to be maintained throughout the outpatient stay will include: orient to surroundings, keep bed in low position, maintain call bell within reach at all times, provide assistance with transfer out of bed and ambulation.  Medication Inspection Compliance: Pill count conducted under aseptic conditions, in front of the patient. Neither the pills nor the bottle was removed from the patient's sight at any time. Once count was completed pills were immediately returned to the patient in their original bottle.  Medication #1: Morphine ER (MSContin) Pill/Patch Count: 10 of 30 pills remain Pill/Patch Appearance: Markings consistent with prescribed medication Bottle Appearance: Standard pharmacy container. Clearly labeled. Filled Date: 32 / 07 / 2020 Last  Medication intake:  Today  Medication #2: Oxycodone/APAP Pill/Patch Count: 24 of 90 pills remain Pill/Patch Appearance: Markings consistent with prescribed medication Bottle Appearance: Standard pharmacy container. Clearly labeled. Filled Date: 10 / 07 /  2018 Last Medication intake:  Today Pharmacokinetics: Liberation and absorption (onset of action): WNL Distribution (time to peak effect): WNL Metabolism and excretion (duration of action): WNL         Pharmacodynamics: Desired effects: Analgesia: [name] reports >50% benefit. Functional ability: Patient reports that medication allows him to accomplish basic ADLs Clinically meaningful improvement in function (CMIF): Sustained CMIF goals met Perceived effectiveness: Described as relatively effective, allowing for increase in activities of daily living (ADL) Undesirable effects: Side-effects or Adverse reactions: None reported Monitoring: [place] PMP: PDMP reviewed during this encounter. Online review of the past 84-monthperiod conducted. Compliant with practice rules and regulations Last UDS on record: Summary  Date Value Ref Range Status  09/17/2018 FINAL  Final    Comment:    ==================================================================== TOXASSURE SELECT 13 (MW) ==================================================================== Test                             Result       Flag       Units Drug Present and Declared for Prescription Verification   Morphine                       3725         EXPECTED   ng/mg creat   Normorphine                    97           EXPECTED   ng/mg creat    Potential sources of large amounts of morphine in the absence of    codeine include administration of morphine or use of heroin.    Normorphine is an expected metabolite of morphine.   Oxycodone                      744          EXPECTED   ng/mg creat   Oxymorphone                    2835         EXPECTED   ng/mg creat   Noroxycodone                   565          EXPECTED   ng/mg creat   Noroxymorphone                 398          EXPECTED   ng/mg creat    Sources of oxycodone are scheduled prescription medications.    Oxymorphone, noroxycodone, and noroxymorphone are expected    metabolites  of oxycodone. Oxymorphone is also available as a    scheduled prescription medication. ==================================================================== Test                      Result    Flag   Units      Ref Range   Creatinine              108              mg/dL      >=20 ==================================================================== Declared  Medications:  The flagging and interpretation on this report are based on the  following declared medications.  Unexpected results may arise from  inaccuracies in the declared medications.  **Note: The testing scope of this panel includes these medications:  Morphine (MS Contin)  Oxycodone (Percocet)  **Note: The testing scope of this panel does not include following  reported medications:  Acetaminophen (Percocet)  Amlodipine (Norvasc)  Atorvastatin (Lipitor)  Febuxostat  Furosemide (Lasix)  Losartan (Cozaar)  Naloxone (Narcan)  Prednisolone  Prednisone (Deltasone)  Vitamin D2 (Drisdol) ==================================================================== For clinical consultation, please call [phone]. ====================================================================    UDS interpretation: Compliant          Medication Assessment Form: Reviewed. Patient indicates being compliant with therapy Treatment compliance: Compliant Risk Assessment Profile: Aberrant behavior: See initial evaluations. None observed or detected today Comorbid factors increasing risk of overdose: See initial evaluation. No additional risks detected today Opioid risk tool (ORT):  Opioid Risk  09/10/2019  Alcohol 0  Illegal Drugs 0  Rx Drugs 0  Alcohol 0  Illegal Drugs 0  Rx Drugs 0  Age between 16-45 years  1  History of Preadolescent Sexual Abuse 0  Psychological Disease 0  Depression 0  Opioid Risk Tool Scoring 1  Opioid Risk Interpretation Low Risk    ORT Scoring interpretation table:  Score <3 = Low Risk for SUD  Score between  4-7 = Moderate Risk for SUD  Score >8 = High Risk for Opioid Abuse   Risk of substance use disorder (SUD): Low  Risk Mitigation Strategies:  Patient Counseling: Covered Patient-Prescriber Agreement (PPA): Present and active  Notification to other healthcare providers: Done  Pharmacologic Plan: No change in therapy, at this time.             Laboratory Chemistry Profile   Screening Lab Results  Component Value Date   STAPHAUREUS NEGATIVE 10/16/2014   MRSAPCR NEGATIVE 10/16/2014    Inflammation (CRP: Acute Phase) (ESR: Chronic Phase) No results found for: CRP, ESRSEDRATE, LATICACIDVEN                       Rheumatology Lab Results  Component Value Date   LABURIC 7.7 04/23/2018                        Renal Lab Results  Component Value Date   BUN 61 (H) 04/23/2018   CREATININE 2.58 (H) 04/23/2018   BCR 24 (H) 04/23/2018   GFRAA 34 (L) 04/23/2018   GFRNONAA 29 (L) 04/23/2018                             Hepatic Lab Results  Component Value Date   AST 19 10/23/2018   ALT 17 10/23/2018   ALBUMIN 4.6 10/16/2017   ALKPHOS 95 10/16/2017                        Electrolytes Lab Results  Component Value Date   NA 138 04/23/2018   K 4.4 04/23/2018   CL 104 04/23/2018   CALCIUM 9.6 04/23/2018                        Neuropathy Lab Results  Component Value Date   HGBA1C 5.6 10/23/2018  CNS No results found for: COLORCSF, APPEARCSF, RBCCOUNTCSF, WBCCSF, POLYSCSF, LYMPHSCSF, EOSCSF, PROTEINCSF, GLUCCSF, JCVIRUS, CSFOLI, IGGCSF, LABACHR, ACETBL                      Bone No results found for: VD25OH, [id number], [id number], [id number], 25OHVITD1, 25OHVITD2, 25OHVITD3, TESTOFREE, TESTOSTERONE                       Coagulation Lab Results  Component Value Date   INR 1.02 10/16/2014   LABPROT 13.5 10/16/2014   APTT 37 10/16/2014   PLT 241 06/05/2016                        Cardiovascular Lab Results  Component Value Date   HGB 11.9 (A)  07/04/2018   HCT 35 (A) 06/14/2018                         ID Lab Results  Component Value Date   STAPHAUREUS NEGATIVE 10/16/2014   MRSAPCR NEGATIVE 10/16/2014    Cancer No results found for: CEA, CA125, LABCA2                      Endocrine Lab Results  Component Value Date   TSH 0.92 07/06/2017                        Note: Lab results reviewed.  Recent Diagnostic Imaging Results  DG Hand Complete Right CLINICAL DATA:  Hand pain  EXAM: RIGHT HAND - COMPLETE 3+ VIEW  COMPARISON:  None.  FINDINGS: Negative for acute fracture.  Advanced degenerative change in the radiocarpal joint with joint space narrowing and spurring. Degenerative change in the distal radioulnar joint also is advanced. Interphalangeal joints intact. No erosion.  IMPRESSION: Advanced degenerative change in the radiocarpal joint and distal radioulnar joint. No acute abnormality.  Electronically Signed   By: Franchot Gallo M.D.   On: 07/03/2018 13:01  Complexity Note: Imaging results reviewed. Results shared with Mr. Dehoyos, using Layman's terms.                               Meds   Current Outpatient Medications:  .  amLODipine (NORVASC) 10 MG tablet, Take 1 tablet (10 mg total) by mouth daily., Disp: 90 tablet, Rfl: 1 .  atorvastatin (LIPITOR) 40 MG tablet, Take 1 tablet (40 mg total) by mouth at bedtime. For cholesterol, Disp: 30 tablet, Rfl: 5 .  colchicine 0.6 MG tablet, Take 0.6 mg by mouth as needed., Disp: , Rfl:  .  Febuxostat (ULORIC) 80 MG TABS, Take 1 tablet by mouth every other day. , Disp: , Rfl:  .  furosemide (LASIX) 40 MG tablet, Take 1 tablet (40 mg total) by mouth every other day., Disp: 45 tablet, Rfl: 1 .  losartan (COZAAR) 50 MG tablet, TAKE 1 TABLET BY MOUTH EVERY DAY, Disp: 90 tablet, Rfl: 0 .  [START ON 09/19/2019] morphine (MS CONTIN) 15 MG 12 hr tablet, Take 1 tablet (15 mg total) by mouth daily. Must last 30 days., Disp: 30 tablet, Rfl: 0 .  [START ON 10/19/2019]  morphine (MS CONTIN) 15 MG 12 hr tablet, Take 1 tablet (15 mg total) by mouth daily. Must last 30 days., Disp: 30 tablet, Rfl: 0 .  [START ON 11/18/2019] morphine (MS CONTIN) 15 MG 12  hr tablet, Take 1 tablet (15 mg total) by mouth daily. Must last 30 days., Disp: 30 tablet, Rfl: 0 .  naloxone (NARCAN) nasal spray 4 mg/0.1 mL, Use in case of accidental overdose with narcotics / opioids, Disp: 1 kit, Rfl: 1 .  [START ON 09/19/2019] oxyCODONE-acetaminophen (PERCOCET) 10-325 MG tablet, Take 1 tablet by mouth every 8 (eight) hours as needed for pain. Must last 30 days., Disp: 90 tablet, Rfl: 0 .  [START ON 11/18/2019] oxyCODONE-acetaminophen (PERCOCET) 10-325 MG tablet, Take 1 tablet by mouth every 8 (eight) hours as needed for pain. Must last 30 days., Disp: 90 tablet, Rfl: 0 .  [START ON 10/19/2019] oxyCODONE-acetaminophen (PERCOCET) 10-325 MG tablet, Take 1 tablet by mouth every 8 (eight) hours as needed for pain. Must last 30 days., Disp: 90 tablet, Rfl: 0  ROS  Constitutional: Denies any fever or chills Gastrointestinal: No reported hemesis, hematochezia, vomiting, or acute GI distress Musculoskeletal: Denies any acute onset joint swelling, redness, loss of ROM, or weakness Neurological: No reported episodes of acute onset apraxia, aphasia, dysarthria, agnosia, amnesia, paralysis, loss of coordination, or loss of consciousness  Allergies  Mr. Glazier is allergic to nsaids.  [place]  Drug: Mr. Manheim  reports no history of drug use. Alcohol:  reports no history of alcohol use. Tobacco:  reports that he has been smoking cigarettes. He has a 11.00 pack-year smoking history. He has never used smokeless tobacco. Medical:  has a past medical history of Anemia, Bone spur of other site, Chronic pain, CKD (chronic kidney disease), Controlled substance agreement signed, Gout, Gouty arthritis, History of YAG laser capsulotomy of lens of left eye, Hyperlipidemia, Hypertension, IFG (impaired fasting glucose), Left knee  DJD, Medullary cystic disease of the kidney, Primary localized osteoarthritis of right knee, and Smoker. Surgical: Mr. Retana  has a past surgical history that includes Carpal tunnel release (Left); Umbilical hernia repair (2010); Cataract extraction w/ intraocular lens implant (Left, 2008); Cataract extraction w/ intraocular lens implant (Right, 2008); Hernia repair; Total knee arthroplasty (Left, 03/30/2014); Eye surgery; Total knee arthroplasty (Right, 10/26/2014); and Joint replacement (Left, 03/2014). Family: family history includes COPD in his maternal grandmother and mother; Kidney disease in his mother; Stroke (age of onset: 28) in his father; Thyroid disease in his mother.  Constitutional Exam  General appearance: Well nourished, well developed, and well hydrated. In no apparent acute distress Vitals:   09/10/19 1405  BP: (!) 185/101  Pulse: (!) 103  Temp: 98.4 F (36.9 C)  SpO2: 98%  Weight: 253 lb (114.8 kg)  Height: 5' 10"  (1.778 m)   BMI Assessment: Estimated body mass index is 36.3 kg/m as calculated from the following:   Height as of this encounter: 5' 10"  (1.778 m).   Weight as of this encounter: 253 lb (114.8 kg).  BMI interpretation table: BMI level Category Range association with higher incidence of chronic pain  <18 kg/m2 Underweight   18.5-24.9 kg/m2 Ideal body weight   25-29.9 kg/m2 Overweight Increased incidence by 20%  30-34.9 kg/m2 Obese (Class I) Increased incidence by 68%  35-39.9 kg/m2 Severe obesity (Class II) Increased incidence by 136%  >40 kg/m2 Extreme obesity (Class III) Increased incidence by 254%   Patient's current BMI Ideal Body weight  Body mass index is 36.3 kg/m. Ideal body weight: 73 kg (160 lb 15 oz) Adjusted ideal body weight: 89.7 kg (197 lb 12.2 oz)   BMI Readings from Last 4 Encounters:  09/10/19 36.30 kg/m  01/16/19 36.32 kg/m  12/05/18 36.24 kg/m  10/23/18 35.81 kg/m   Wt Readings from Last 4 Encounters:  09/10/19 253 lb  (114.8 kg)  01/16/19 253 lb 1.6 oz (114.8 kg)  12/05/18 249 lb (112.9 kg)  10/23/18 249 lb 9.6 oz (113.2 kg)  Psych/Mental status: Alert, oriented x 3 (person, place, & time)       Eyes: PERLA Respiratory: No evidence of acute respiratory distress  Cervical Spine Area Exam  Skin & Axial Inspection: No masses, redness, edema, swelling, or associated skin lesions Alignment: Symmetrical Functional ROM: Unrestricted ROM      Stability: No instability detected Muscle Tone/Strength: Functionally intact. No obvious neuro-muscular anomalies detected. Sensory (Neurological): Musculoskeletal pain pattern Palpation: No palpable anomalies              Upper Extremity (UE) Exam    Side: Right upper extremity  Side: Left upper extremity  Skin & Extremity Inspection: Skin color, temperature, and hair growth are WNL. No peripheral edema or cyanosis. No masses, redness, swelling, asymmetry, or associated skin lesions. No contractures.  Skin & Extremity Inspection: Skin color, temperature, and hair growth are WNL. No peripheral edema or cyanosis. No masses, redness, swelling, asymmetry, or associated skin lesions. No contractures.  Functional ROM: Unrestricted ROM          Functional ROM: Unrestricted ROM          Muscle Tone/Strength: Functionally intact. No obvious neuro-muscular anomalies detected.  Muscle Tone/Strength: Functionally intact. No obvious neuro-muscular anomalies detected.  Sensory (Neurological): Unimpaired          Sensory (Neurological): Unimpaired          Palpation: No palpable anomalies              Palpation: No palpable anomalies              Provocative Test(s):  Phalen's test: deferred Tinel's test: deferred Apley's scratch test (touch opposite shoulder):  Action 1 (Across chest): deferred Action 2 (Overhead): deferred Action 3 (LB reach): deferred   Provocative Test(s):  Phalen's test: deferred Tinel's test: deferred Apley's scratch test (touch opposite shoulder):  Action  1 (Across chest): deferred Action 2 (Overhead): deferred Action 3 (LB reach): deferred    Thoracic Spine Area Exam  Skin & Axial Inspection: No masses, redness, or swelling Alignment: Symmetrical Functional ROM: Unrestricted ROM Stability: No instability detected Muscle Tone/Strength: Functionally intact. No obvious neuro-muscular anomalies detected. Sensory (Neurological): Musculoskeletal pain pattern Muscle strength & Tone: No palpable anomalies  Lumbar Spine Area Exam  Skin & Axial Inspection: No masses, redness, or swelling Alignment: Symmetrical Functional ROM: Unrestricted ROM       Stability: No instability detected Muscle Tone/Strength: Functionally intact. No obvious neuro-muscular anomalies detected. Sensory (Neurological): Musculoskeletal pain pattern   Gait & Posture Assessment  Ambulation: Unassisted Gait: Relatively normal for age and body habitus Posture: WNL   Lower Extremity Exam    Side: Right lower extremity  Side: Left lower extremity  Stability: No instability observed          Stability: No instability observed          Skin & Extremity Inspection: Skin color, temperature, and hair growth are WNL. No peripheral edema or cyanosis. No masses, redness, swelling, asymmetry, or associated skin lesions. No contractures.  Skin & Extremity Inspection: Skin color, temperature, and hair growth are WNL. No peripheral edema or cyanosis. No masses, redness, swelling, asymmetry, or associated skin lesions. No contractures.  Functional ROM: Unrestricted ROM  Functional ROM: Unrestricted ROM                  Muscle Tone/Strength: Functionally intact. No obvious neuro-muscular anomalies detected.  Muscle Tone/Strength: Functionally intact. No obvious neuro-muscular anomalies detected.  Sensory (Neurological): Musculoskeletal pain pattern        Sensory (Neurological): Musculoskeletal pain pattern        DTR: Patellar: deferred today Achilles: deferred  today Plantar: deferred today  DTR: Patellar: deferred today Achilles: deferred today Plantar: deferred today  Palpation: No palpable anomalies  Palpation: No palpable anomalies   Assessment   Status Diagnosis  Controlled Controlled Controlled 1. Chronic pain syndrome   2. History of bilateral knee replacement   3. Primary osteoarthritis of both knees   4. Medullary cystic disease of the kidney       Plan of Care  Pharmacotherapy (Medications Ordered): Meds ordered this encounter  Medications  . oxyCODONE-acetaminophen (PERCOCET) 10-325 MG tablet    Sig: Take 1 tablet by mouth every 8 (eight) hours as needed for pain. Must last 30 days.    Dispense:  90 tablet    Refill:  0    [place] STOP ACT - Not applicable. Fill one day early if pharmacy is closed on scheduled refill date.  . morphine (MS CONTIN) 15 MG 12 hr tablet    Sig: Take 1 tablet (15 mg total) by mouth daily. Must last 30 days.    Dispense:  30 tablet    Refill:  0    [place] STOP ACT - Not applicable. Fill one day early if pharmacy is closed on scheduled refill date.  . morphine (MS CONTIN) 15 MG 12 hr tablet    Sig: Take 1 tablet (15 mg total) by mouth daily. Must last 30 days.    Dispense:  30 tablet    Refill:  0    [place] STOP ACT - Not applicable. Fill one day early if pharmacy is closed on scheduled refill date.  . morphine (MS CONTIN) 15 MG 12 hr tablet    Sig: Take 1 tablet (15 mg total) by mouth daily. Must last 30 days.    Dispense:  30 tablet    Refill:  0    [place] STOP ACT - Not applicable. Fill one day early if pharmacy is closed on scheduled refill date.  [place] Kitchen oxyCODONE-acetaminophen (PERCOCET) 10-325 MG tablet    Sig: Take 1 tablet by mouth every 8 (eight) hours as needed for pain. Must last 30 days.    Dispense:  90 tablet    Refill:  0    [place] STOP ACT - Not applicable. Fill one day early if pharmacy is closed on scheduled refill date.  Mar[place] Kitchen oxyCODONE-acetaminophen (PERCOCET) 10-325 MG tablet    Sig: Take 1 tablet  by mouth every 8 (eight) hours as needed for pain. Must last 30 days.    Dispense:  90 tablet    Refill:  0    [place] STOP ACT - Not applicable. Fill one day early if pharmacy is closed on scheduled refill date.    Orders:  Orders Placed This Encounter  Procedures  . ToxASSURE Select 13 (MW), Urine    Volume: 30 ml(s). Minimum 3 ml of urine is needed. Document temperature of fresh sample. Indications: Long term (current) use of opiate analgesic (Z79.891)    Lab Orders     ToxASSURE Select 13 (MW), Urine Planned follow-up:   Return in about 3 months (around 12/11/2019) for Medication Management.  Recent Visits No visits were found meeting these conditions.  Showing recent visits within past 90 days and meeting all other requirements   Today's Visits Date Type Provider Dept  09/10/19 Office Visit Gillis Santa, MD [facility]  Showing today's visits and meeting all other requirements   Future Appointments Date Type Provider Dept  12/09/19 Appointment Gillis Santa, MD [facility]  Showing future appointments within next 90 days and meeting all other requirements   Primary Care Physician: Arnetha Courser, MD Location: [facility] Note by: Gillis Santa, MD Date: 09/10/2019; Time: 4:11 PM  Note: This dictation was prepared with Dragon dictation. Any transcriptional errors that may result from this process are unintentional.

## 2019-09-10 NOTE — Progress Notes (Signed)
Nursing Pain Medication Assessment:  Safety precautions to be maintained throughout the outpatient stay will include: orient to surroundings, keep bed in low position, maintain call bell within reach at all times, provide assistance with transfer out of bed and ambulation.  Medication Inspection Compliance: Pill count conducted under aseptic conditions, in front of the patient. Neither the pills nor the bottle was removed from the patient's sight at any time. Once count was completed pills were immediately returned to the patient in their original bottle.  Medication #1: Morphine ER (MSContin) Pill/Patch Count: 10 of 30 pills remain Pill/Patch Appearance: Markings consistent with prescribed medication Bottle Appearance: Standard pharmacy container. Clearly labeled. Filled Date: 36 / 07 / 2020 Last Medication intake:  Today  Medication #2: Oxycodone/APAP Pill/Patch Count: 24 of 90 pills remain Pill/Patch Appearance: Markings consistent with prescribed medication Bottle Appearance: Standard pharmacy container. Clearly labeled. Filled Date: 10 / 07 / 2018 Last Medication intake:  Today

## 2019-09-13 LAB — TOXASSURE SELECT 13 (MW), URINE

## 2019-10-07 ENCOUNTER — Other Ambulatory Visit: Payer: Self-pay

## 2019-10-07 ENCOUNTER — Encounter: Payer: Self-pay | Admitting: *Deleted

## 2019-10-07 ENCOUNTER — Emergency Department: Payer: Medicare Other

## 2019-10-07 DIAGNOSIS — Z79899 Other long term (current) drug therapy: Secondary | ICD-10-CM | POA: Insufficient documentation

## 2019-10-07 DIAGNOSIS — N183 Chronic kidney disease, stage 3 unspecified: Secondary | ICD-10-CM | POA: Diagnosis not present

## 2019-10-07 DIAGNOSIS — M79672 Pain in left foot: Secondary | ICD-10-CM | POA: Diagnosis present

## 2019-10-07 DIAGNOSIS — Z96653 Presence of artificial knee joint, bilateral: Secondary | ICD-10-CM | POA: Insufficient documentation

## 2019-10-07 DIAGNOSIS — F17210 Nicotine dependence, cigarettes, uncomplicated: Secondary | ICD-10-CM | POA: Diagnosis not present

## 2019-10-07 DIAGNOSIS — L03116 Cellulitis of left lower limb: Secondary | ICD-10-CM | POA: Insufficient documentation

## 2019-10-07 DIAGNOSIS — I129 Hypertensive chronic kidney disease with stage 1 through stage 4 chronic kidney disease, or unspecified chronic kidney disease: Secondary | ICD-10-CM | POA: Insufficient documentation

## 2019-10-07 DIAGNOSIS — M7989 Other specified soft tissue disorders: Secondary | ICD-10-CM | POA: Diagnosis not present

## 2019-10-07 NOTE — ED Triage Notes (Signed)
Pt reports he stepped on a pencil yesterday. Small puncture wound in between 4th&5th toes. Reports onset of swelling and redness to his foot today. No fevers.

## 2019-10-08 ENCOUNTER — Emergency Department
Admission: EM | Admit: 2019-10-08 | Discharge: 2019-10-08 | Disposition: A | Discharge status: Home / Self Care | Payer: Medicare Other | Attending: Emergency Medicine | Admitting: Emergency Medicine

## 2019-10-08 DIAGNOSIS — L03116 Cellulitis of left lower limb: Secondary | ICD-10-CM | POA: Diagnosis not present

## 2019-10-08 DIAGNOSIS — L03119 Cellulitis of unspecified part of limb: Secondary | ICD-10-CM

## 2019-10-08 LAB — GLUCOSE, CAPILLARY: Glucose-Capillary: 198 mg/dL — ABNORMAL HIGH (ref 70–99)

## 2019-10-08 MED ORDER — CLINDAMYCIN HCL 300 MG PO CAPS: 300.0000 mg | ORAL_CAPSULE | Freq: Three times a day (TID) | ORAL | 0 refills | Status: AC

## 2019-10-08 MED ORDER — CLINDAMYCIN HCL 150 MG PO CAPS
300.0000 mg | ORAL_CAPSULE | Freq: Once | ORAL | Status: AC
  Administered 2019-10-08: 300 mg via ORAL
  Filled 2019-10-08: qty 2

## 2019-10-08 NOTE — ED Provider Notes (Signed)
[facility] Emergency Department Provider Note  ____________________________________________  Time seen: Approximately 1:46 AM  I have reviewed the triage vital signs and the nursing notes.   HISTORY  Chief Complaint Foot Pain   HPI Nathan Ramos is a 48 y.o. male with a history of chronic kidney disease, gout, hypertension, hyperlipidemia, anemia who presents for evaluation of left foot pain and swelling.  Patient reports that 2 days ago he kicked a regular school pencil.  He sustained a puncture wound from the tip of the pencil between his left fourth and fifth toes.  Over the last 24 hours he noticed swelling and redness has been getting progressively worse on the dorsum of the foot.  Is complaining of mild throbbing pain which has been constant.  No fever, chills, body aches, nausea or vomiting.  Patient denies being a diabetic.   Past Medical History:  Diagnosis Date  . Anemia    of chronic renal disease  . Bone spur of other site    right shoulder, managed by ortho  . Chronic pain    [name] CFNP at [facility]  . CKD (chronic kidney disease)    stage III 03/2014 (Dr. Mosetta Pigeon)  . Controlled substance agreement signed    signed 06/02/15  . Gout   . Gouty arthritis    knee, managed by Ortho  . History of YAG laser capsulotomy of lens of left eye   . Hyperlipidemia   . Hypertension   . IFG (impaired fasting glucose)   . Left knee DJD   . 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 Primary localized osteoarthritis of right knee   . Smoker     Patient Active Problem List   Diagnosis Date Noted  . Hyperglycemia 10/23/2018  . Morbid obesity (HCC) 10/23/2018  . Low HDL (under 40) 12/21/2017  . Opioid use 10/11/2017  . Vitamin D deficiency 03/30/2017  . Chronic pain syndrome 09/19/2016  . Tobacco abuse 06/05/2016  . Obesity 03/05/2016  . Trigger ring finger of right hand 12/24/2015  .  Wrist pain, right 11/26/2015  . Medication monitoring encounter 11/26/2015  . Controlled substance agreement signed   . Chronic kidney disease, stage III (moderate) (HCC)   . Anemia   . Hypertriglyceridemia   . Hx of impaired glucose tolerance   . Gouty arthritis   . Primary localized osteoarthritis of right knee   . Gout due to renal impairment 03/30/2014  . DJD (degenerative joint disease) of knee 03/30/2014  . Medullary cystic disease of the kidney   . Left knee DJD   . Chronic pain   . Hypertension     Past Surgical History:  Procedure Laterality Date  . CARPAL TUNNEL RELEASE Left   . CATARACT EXTRACTION W/ INTRAOCULAR LENS IMPLANT Left 2008  . CATARACT EXTRACTION W/ INTRAOCULAR LENS IMPLANT Right 2008  . EYE SURGERY    . HERNIA REPAIR    . JOINT REPLACEMENT Left 03/2014  . TOTAL KNEE ARTHROPLASTY Left 03/30/2014   Procedure: TOTAL KNEE ARTHROPLASTY;  Surgeon: Nilda Simmer, MD;  Location: [facility] OR;  Service: Orthopedics;  Laterality: Left;  . TOTAL KNEE ARTHROPLASTY Right 10/26/2014   Procedure: TOTAL KNEE ARTHROPLASTY;  Surgeon: Nilda Simmer, MD;  Location: [facility] OR;  Service: Orthopedics;  Laterality: Right;  . UMBILICAL HERNIA REPAIR  2010    Prior to Admission medications   Medication Sig Start Date End Date Taking?  Authorizing Provider  amLODipine (NORVASC) 10 MG tablet Take 1 tablet (10 mg total) by mouth daily. 11/09/16   Kerman Passey, MD  atorvastatin (LIPITOR) 40 MG tablet Take 1 tablet (40 mg total) by mouth at bedtime. For cholesterol 04/24/18   Lada, Janit Bern, MD  clindamycin (CLEOCIN) 300 MG capsule Take 1 capsule (300 mg total) by mouth 3 (three) times daily for 10 days. 10/08/19 10/18/19  Nita Sickle, MD  colchicine 0.6 MG tablet Take 0.6 mg by mouth as needed.    [provider]  Febuxostat (ULORIC) 80 MG TABS Take 1 tablet by mouth every other day.     [provider]  furosemide (LASIX) 40 MG tablet Take 1 tablet (40 mg total) by  mouth every other day. 06/05/16   Lada, Janit Bern, MD  losartan (COZAAR) 50 MG tablet TAKE 1 TABLET BY MOUTH EVERY DAY 05/18/16   Lada, Janit Bern, MD  morphine (MS CONTIN) 15 MG 12 hr tablet Take 1 tablet (15 mg total) by mouth daily. Must last 30 days. 09/19/19 10/19/19  [name], MD  morphine (MS CONTIN) 15 MG 12 hr tablet Take 1 tablet (15 mg total) by mouth daily. Must last 30 days. 10/19/19 11/18/19  [name], MD  morphine (MS CONTIN) 15 MG 12 hr tablet Take 1 tablet (15 mg total) by mouth daily. Must last 30 days. 11/18/19 12/18/19  [name], MD  naloxone [facility]) nasal spray 4 mg/0.1 mL Use in case of accidental overdose with narcotics / opioids 10/26/17   Lada, Janit Bern, MD  oxyCODONE-acetaminophen (PERCOCET) 10-325 MG tablet Take 1 tablet by mouth every 8 (eight) hours as needed for pain. Must last 30 days. 09/19/19 10/19/19  [name], MD  oxyCODONE-acetaminophen (PERCOCET) 10-325 MG tablet Take 1 tablet by mouth every 8 (eight) hours as needed for pain. Must last 30 days. 11/18/19 12/18/19  [name], MD  oxyCODONE-acetaminophen (PERCOCET) 10-325 MG tablet Take 1 tablet by mouth every 8 (eight) hours as needed for pain. Must last 30 days. 10/19/19 11/18/19  [name], MD    Allergies Nsaids  Family History  Problem Relation Age of Onset  . COPD Mother   . Kidney disease Mother   . Thyroid disease Mother   . Stroke Father 37  . COPD Maternal Grandmother     Social History Social History   Tobacco Use  . Smoking status: Current Every Day Smoker    Packs/day: 0.50    Years: 22.00    Pack years: 11.00    Types: Cigarettes  . Smokeless tobacco: Never Used  Substance Use Topics  . Alcohol use: No  . Drug use: No    Review of Systems  Constitutional: Negative for fever. Eyes: Negative for visual changes. ENT: Negative for sore throat. Neck: No neck pain  Cardiovascular: Negative for chest pain. Respiratory: Negative for shortness of breath. Gastrointestinal:  Negative for abdominal pain, vomiting or diarrhea. Genitourinary: Negative for dysuria. Musculoskeletal: Negative for back pain. + L foot pain and swelling Skin: Negative for rash. Neurological: Negative for headaches, weakness or numbness. Psych: No SI or HI  ____________________________________________   PHYSICAL EXAM:  VITAL SIGNS: Vitals:   10/08/19 0144 10/08/19 0151  BP: (!) 154/94   Pulse: 100 92  Resp:    Temp: 98.3 F (36.8 C)   SpO2: 98% 98%    Constitutional: Alert and oriented. Well appearing and in no apparent distress. HEENT:      Head: Normocephalic and atraumatic.  Eyes: Conjunctivae are normal. Sclera is non-icteric.       Mouth/Throat: Mucous membranes are moist.       Neck: Supple with no signs of meningismus. Cardiovascular: Regular rate and rhythm.  Respiratory: Normal respiratory effort.  Musculoskeletal: There is a small puncture wound in the inner aspect of the left fifth toe with erythema, warmth, and swelling that starts in that area and progresses to the dorsum of the foot, no necrosis, blistering, foreign body, crepitus, pus, or fluctuance. Normal strong DP and PT pulses.  neurologic: Normal speech and language. Face is symmetric. Moving all extremities. No gross focal neurologic deficits are appreciated. Skin: Skin is warm, dry and intact. No rash noted. Psychiatric: Mood and affect are normal. Speech and behavior are normal.     ____________________________________________   LABS (all labs ordered are listed, but only abnormal results are displayed)  Labs Reviewed  CBG MONITORING, ED   ____________________________________________  EKG  none  ____________________________________________  RADIOLOGY  I have personally reviewed the images performed during this visit and I agree with the Radiologist's read.   Interpretation by Radiologist:  Dg Foot Complete Left  Result Date: 10/07/2019 CLINICAL DATA:  Foot swelling,  redness, stepped on pencil yesterday with puncture wound between the fourth and fifth toes EXAM: LEFT FOOT - COMPLETE 3+ VIEW COMPARISON:  None. FINDINGS: No acute fracture or traumatic malalignment. There is diffuse swelling of the forefoot most pronounced near the fourth and fifth metatarsophalangeal joints, correlate with site of puncture wound. No soft tissue gas or foreign body is seen. Lateral angulation across the second distal interphalangeal joint may be remote posttraumatic or developmental. 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 Midfoot and hindfoot alignment is grossly preserved though incompletely assessed on nonweightbearing films. Corticated os trigonum posteriorly. IMPRESSION: 1. No soft tissue gas or foreign body. 2. Diffuse forefoot soft tissue swelling, most pronounced near the fourth and fifth metatarsophalangeal joints. Correlate with site of puncture wound. 3. 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 Alignment grossly preserved though incompletely assessed on nonweightbearing radiograph. Electronically Signed   By: Kreg ShropshirePrice  DeHay M.D.   On: 10/07/2019 22:43     ____________________________________________   PROCEDURES  Procedure(s) performed: None Procedures Critical Care performed:  None ____________________________________________   INITIAL IMPRESSION / ASSESSMENT AND PLAN / ED COURSE  48 y.o. male with a history of chronic kidney disease, gout, hypertension, hyperlipidemia, anemia who presents for evaluation of left foot pain and swelling after kicking a pencil 3 days ago and sustaining a puncture wound to the toe. Patient has cellulitis. No gas on XR and no crepitus on exam. No pus or abscess.  No foreign body.  Strong intact pulses and brisk cap refill. Patient denies h/o diabetes. CBG elevated at 198 however patient reports  drinking several Mountain Dew's since 8 AM this morning.  Recommended a fasting blood glucose recheck.  No signs of sepsis with no fever or systemic symptoms.  Patient was started on clindamycin.  Cellulitic area was demarcated and a picture included in patient's records for close follow-up.  Discussed wound care and follow-up with PCP.  Discussed my standard return precautions.  Patient be discharged home on clindamycin 300 mg 3 times daily for 10 days.       As part of my medical decision making, I reviewed the following data within the electronic MEDICAL RECORD NUMBER Nursing notes reviewed and  incorporated, Labs reviewed , Old chart reviewed, Radiograph reviewed , Notes from prior ED visits and [place] Controlled Substance Database   Please note:  Patient was evaluated in Emergency Department today for the symptoms described in the history of present illness. Patient was evaluated in the context of the global COVID-19 pandemic, which necessitated consideration that the patient might be at risk for infection with the SARS-CoV-2 virus that causes COVID-19. Institutional protocols and algorithms that pertain to the evaluation of patients at risk for COVID-19 are in a state of rapid change based on information released by regulatory bodies including the CDC and federal and state organizations. These policies and algorithms were followed during the patient's care in the ED.  Some ED evaluations and interventions may be delayed as a result of limited staffing during the pandemic.   ____________________________________________   FINAL CLINICAL IMPRESSION(S) / ED DIAGNOSES   Final diagnoses:  Cellulitis of foot      NEW MEDICATIONS STARTED DURING THIS VISIT:  ED Discharge Orders         Ordered    clindamycin (CLEOCIN) 300 MG capsule  3 times daily     10/08/19 0145           Note:  This document was prepared using Dragon voice recognition software and may include unintentional dictation  errors.    Rudene Re, MD 10/08/19 [phone]

## 2019-10-08 NOTE — Discharge Instructions (Signed)

## 2019-10-11 ENCOUNTER — Other Ambulatory Visit: Payer: Self-pay

## 2019-10-11 ENCOUNTER — Emergency Department
Admission: EM | Admit: 2019-10-11 | Discharge: 2019-10-11 | Disposition: A | Discharge status: Home / Self Care | Payer: Medicare Other | Attending: Emergency Medicine | Admitting: Emergency Medicine

## 2019-10-11 ENCOUNTER — Encounter: Payer: Self-pay | Admitting: Intensive Care

## 2019-10-11 DIAGNOSIS — L02612 Cutaneous abscess of left foot: Secondary | ICD-10-CM

## 2019-10-11 DIAGNOSIS — F17210 Nicotine dependence, cigarettes, uncomplicated: Secondary | ICD-10-CM | POA: Insufficient documentation

## 2019-10-11 DIAGNOSIS — L03116 Cellulitis of left lower limb: Secondary | ICD-10-CM

## 2019-10-11 DIAGNOSIS — N183 Chronic kidney disease, stage 3 unspecified: Secondary | ICD-10-CM | POA: Diagnosis not present

## 2019-10-11 DIAGNOSIS — Z23 Encounter for immunization: Secondary | ICD-10-CM | POA: Insufficient documentation

## 2019-10-11 DIAGNOSIS — Z79899 Other long term (current) drug therapy: Secondary | ICD-10-CM | POA: Diagnosis not present

## 2019-10-11 DIAGNOSIS — I129 Hypertensive chronic kidney disease with stage 1 through stage 4 chronic kidney disease, or unspecified chronic kidney disease: Secondary | ICD-10-CM | POA: Diagnosis not present

## 2019-10-11 LAB — COMPREHENSIVE METABOLIC PANEL
ALT: 14 U/L (ref 0–44)
AST: 18 U/L (ref 15–41)
Albumin: 4.4 g/dL (ref 3.5–5.0)
Alkaline Phosphatase: 93 U/L (ref 38–126)
Anion gap: 13 (ref 5–15)
BUN: 49 mg/dL — ABNORMAL HIGH (ref 6–20)
CO2: 22 mmol/L (ref 22–32)
Calcium: 9.7 mg/dL (ref 8.9–10.3)
Chloride: 104 mmol/L (ref 98–111)
Creatinine, Ser: 2.7 mg/dL — ABNORMAL HIGH (ref 0.61–1.24)
GFR calc Af Amer: 31 mL/min — ABNORMAL LOW (ref >60)
GFR calc non Af Amer: 27 mL/min — ABNORMAL LOW (ref >60)
Glucose, Bld: 158 mg/dL — ABNORMAL HIGH (ref 70–99)
Potassium: 4.4 mmol/L (ref 3.5–5.1)
Sodium: 139 mmol/L (ref 135–145)
Total Bilirubin: 0.7 mg/dL (ref 0.3–1.2)
Total Protein: 8.5 g/dL — ABNORMAL HIGH (ref 6.5–8.1)

## 2019-10-11 LAB — CBC WITH DIFFERENTIAL/PLATELET
Abs Immature Granulocytes: 0.05 10*3/uL (ref 0.00–0.07)
Basophils Absolute: 0.1 10*3/uL (ref 0.0–0.1)
Basophils Relative: 1 %
Eosinophils Absolute: 0.5 10*3/uL (ref 0.0–0.5)
Eosinophils Relative: 5 %
HCT: 37.5 % — ABNORMAL LOW (ref 39.0–52.0)
Hemoglobin: 12.8 g/dL — ABNORMAL LOW (ref 13.0–17.0)
Immature Granulocytes: 1 %
Lymphocytes Relative: 27 %
Lymphs Abs: 2.5 10*3/uL (ref 0.7–4.0)
MCH: 28.5 pg (ref 26.0–34.0)
MCHC: 34.1 g/dL (ref 30.0–36.0)
MCV: 83.5 fL (ref 80.0–100.0)
Monocytes Absolute: 0.5 10*3/uL (ref 0.1–1.0)
Monocytes Relative: 6 %
Neutro Abs: 5.6 10*3/uL (ref 1.7–7.7)
Neutrophils Relative %: 60 %
Platelets: 301 10*3/uL (ref 150–400)
RBC: 4.49 MIL/uL (ref 4.22–5.81)
RDW: 12.4 % (ref 11.5–15.5)
WBC: 9.2 10*3/uL (ref 4.0–10.5)
nRBC: 0 % (ref 0.0–0.2)

## 2019-10-11 MED ORDER — CIPROFLOXACIN HCL 500 MG PO TABS
500.0000 mg | ORAL_TABLET | Freq: Once | ORAL | Status: AC
  Administered 2019-10-11: 500 mg via ORAL
  Filled 2019-10-11: qty 1

## 2019-10-11 MED ORDER — TETANUS-DIPHTH-ACELL PERTUSSIS 5-2.5-18.5 LF-MCG/0.5 IM SUSP
0.5000 mL | Freq: Once | INTRAMUSCULAR | Status: AC
  Administered 2019-10-11: 0.5 mL via INTRAMUSCULAR
  Filled 2019-10-11: qty 0.5

## 2019-10-11 MED ORDER — CIPROFLOXACIN HCL 500 MG PO TABS: 500.0000 mg | ORAL_TABLET | Freq: Two times a day (BID) | ORAL | 0 refills | Status: AC

## 2019-10-11 MED ORDER — HYDROCODONE-ACETAMINOPHEN 5-325 MG PO TABS: 1.0000 | ORAL_TABLET | Freq: Once | ORAL | Status: DC

## 2019-10-11 MED ORDER — CEPHALEXIN 500 MG PO CAPS: 500.0000 mg | ORAL_CAPSULE | Freq: Once | ORAL | Status: DC

## 2019-10-11 NOTE — ED Provider Notes (Signed)
[facility] Emergency Department Provider Note ____________________________________________  Time seen: 2000  I have reviewed the triage vital signs and the nursing notes.  HISTORY  Chief Complaint  Wound Infection (left foot)  HPI Nathan GroomsStacey L Teachey is a 48 y.o. male returns to the ED for concern over slow improvement of his left foot cellulitis. He reported 3 days prior for a puncture wound between the 4th & 5th toes.  He has been taking the clindamycin as directed. He thought the was to return due to redness extending over the skin marker line drawn over the toes. He denies interim fevers, chills, or sweats.   Past Medical History:  Diagnosis Date  . Anemia    of chronic renal disease  . Bone spur of other site    right shoulder, managed by ortho  . Chronic pain    Dest[facility] Ladona [facility]aylor [facility]  . CKD (chronic kidney disease)    stage III 03/2014 (Dr. Mosetta PigeonHarmeet Singh)  . Controlled substance agreement signed    signed 06/02/15  . Gout   . Gouty arthritis    knee, managed by Ortho  . History of YAG laser capsulotomy of lens of left eye   . Hyperlipidemia   . Hypertension   . IFG (impaired fasting glucose)   . Left knee DJD   . 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 Primary localized osteoarthritis of right knee   . Smoker     Patient Active Problem List   Diagnosis Date Noted  . Hyperglycemia 10/23/2018  . Morbid obesity (HCC) 10/23/2018  . Low HDL (under 40) 12/21/2017  . Opioid use 10/11/2017  . Vitamin D deficiency 03/30/2017  . Chronic pain syndrome 09/19/2016  . Tobacco abuse 06/05/2016  . Obesity 03/05/2016  . Trigger ring finger of right hand 12/24/2015  . Wrist pain, right 11/26/2015  . Medication monitoring encounter 11/26/2015  . Controlled substance agreement signed   . Chronic kidney disease, stage III (moderate) (HCC)   . Anemia   . Hypertriglyceridemia   . Hx of impaired glucose  tolerance   . Gouty arthritis   . Primary localized osteoarthritis of right knee   . Gout due to renal impairment 03/30/2014  . DJD (degenerative joint disease) of knee 03/30/2014  . Medullary cystic disease of the kidney   . Left knee DJD   . Chronic pain   . Hypertension     Past Surgical History:  Procedure Laterality Date  . CARPAL TUNNEL RELEASE Left   . CATARACT EXTRACTION W/ INTRAOCULAR LENS IMPLANT Left 2008  . CATARACT EXTRACTION W/ INTRAOCULAR LENS IMPLANT Right 2008  . EYE SURGERY    . HERNIA REPAIR    . JOINT REPLACEMENT Left 03/2014  . TOTAL KNEE ARTHROPLASTY Left 03/30/2014   Procedure: TOTAL KNEE ARTHROPLASTY;  Surgeon: Nilda Simmerobert A Wainer, MD;  Location: [facility] OR;  Service: Orthopedics;  Laterality: Left;  . TOTAL KNEE ARTHROPLASTY Right 10/26/2014   Procedure: TOTAL KNEE ARTHROPLASTY;  Surgeon: Nilda Simmerobert A Wainer, MD;  Location: [facility] OR;  Service: Orthopedics;  Laterality: Right;  . UMBILICAL HERNIA REPAIR  2010    Prior to Admission medications   Medication Sig Start Date End Date Taking? Authorizing Provider  amLODipine (NORVASC) 10 MG tablet Take 1 tablet (10 mg total) by mouth daily. 11/09/16   Kerman PasseyLada, Melinda P, MD  atorvastatin (LIPITOR) 40 MG tablet Take 1 tablet (40 mg total) by mouth at bedtime.  For cholesterol 04/24/18   Lada, Janit Bern, MD  ciprofloxacin (CIPRO) 500 MG tablet Take 1 tablet (500 mg total) by mouth 2 (two) times daily for 7 days. 10/12/19 10/19/19  Kavita Bartl, Charlesetta Ivory, PA-C  clindamycin (CLEOCIN) 300 MG capsule Take 1 capsule (300 mg total) by mouth 3 (three) times daily for 10 days. 10/08/19 10/18/19  Nita Sickle, MD  colchicine 0.6 MG tablet Take 0.6 mg by mouth as needed.    [provider]  Febuxostat (ULORIC) 80 MG TABS Take 1 tablet by mouth every other day.     [provider]  furosemide (LASIX) 40 MG tablet Take 1 tablet (40 mg total) by mouth every other day. 06/05/16   Lada, Janit Bern, MD  losartan (COZAAR) 50 MG tablet  TAKE 1 TABLET BY MOUTH EVERY DAY 05/18/16   Lada, Janit Bern, MD  morphine (MS CONTIN) 15 MG 12 hr tablet Take 1 tablet (15 mg total) by mouth daily. Must last 30 days. 09/19/19 10/19/19  [name], MD  morphine (MS CONTIN) 15 MG 12 hr tablet Take 1 tablet (15 mg total) by mouth daily. Must last 30 days. 10/19/19 11/18/19  [name], MD  morphine (MS CONTIN) 15 MG 12 hr tablet Take 1 tablet (15 mg total) by mouth daily. Must last 30 days. 11/18/19 12/18/19  [name], MD  naloxone [facility]) nasal spray 4 mg/0.1 mL Use in case of accidental overdose with narcotics / opioids 10/26/17   Lada, Janit Bern, MD  oxyCODONE-acetaminophen (PERCOCET) 10-325 MG tablet Take 1 tablet by mouth every 8 (eight) hours as needed for pain. Must last 30 days. 09/19/19 10/19/19  [name], MD  oxyCODONE-acetaminophen (PERCOCET) 10-325 MG tablet Take 1 tablet by mouth every 8 (eight) hours as needed for pain. Must last 30 days. 11/18/19 12/18/19  [name], MD  oxyCODONE-acetaminophen (PERCOCET) 10-325 MG tablet Take 1 tablet by mouth every 8 (eight) hours as needed for pain. Must last 30 days. 10/19/19 11/18/19  [name], MD    Allergies Nsaids  Family History  Problem Relation Age of Onset  . COPD Mother   . Kidney disease Mother   . Thyroid disease Mother   . Stroke Father 61  . COPD Maternal Grandmother     Social History Social History   Tobacco Use  . Smoking status: Current Every Day Smoker    Packs/day: 0.50    Years: 22.00    Pack years: 11.00    Types: Cigarettes  . Smokeless tobacco: Never Used  Substance Use Topics  . Alcohol use: No  . Drug use: No    Review of Systems  Constitutional: Negative for fever. Cardiovascular: Negative for chest pain. Respiratory: Negative for shortness of breath. Musculoskeletal: Negative for back pain. Skin: Negative for rash. Left foot cellulitis.  Neurological: Negative for headaches, focal weakness or  numbness. ____________________________________________  PHYSICAL EXAM:  VITAL SIGNS: ED Triage Vitals  Enc Vitals Group     BP 10/11/19 1635 (!) 164/85     Pulse Rate 10/11/19 1635 99     Resp 10/11/19 1635 16     Temp 10/11/19 1635 98.8 F (37.1 C)     Temp Source 10/11/19 1635 Oral     SpO2 10/11/19 1635 99 %     Weight 10/11/19 1636 253 lb (114.8 kg)     Height 10/11/19 1636 5\' 10"  (1.778 m)     Head Circumference --      Peak Flow --  Pain Score 10/11/19 1636 6     Pain Loc --      Pain Edu? --      Excl. in [place]? --     Constitutional: Alert and oriented. Well appearing and in no distress. Head: Normocephalic and atraumatic. Eyes: Conjunctivae are normal. Normal extraocular movements Cardiovascular: Normal rate, regular rhythm. Normal distal pulses. Respiratory: Normal respiratory effort. No wheezes/rales/rhonchi. Gastrointestinal: Soft and nontender. No distention. Musculoskeletal: Nontender with normal range of motion in all extremities.  Neurologic:  Normal gait without ataxia. Normal speech and language. No gross focal neurologic deficits are appreciated. Skin:  Skin is warm, dry and intact. Local erythema to the dorsal 5th toe on the left. Purulent drainage to the webspace between the 4th & 5th digits. Improved dorsal erythema based on skin marker. ____________________________________________   LABS (pertinent positives/negatives)  Labs Reviewed  CBC WITH DIFFERENTIAL/PLATELET - Abnormal; Notable for the following components:      Result Value   Hemoglobin 12.8 (*)    HCT 37.5 (*)    All other components within normal limits  COMPREHENSIVE METABOLIC PANEL - Abnormal; Notable for the following components:   Glucose, Bld 158 (*)    BUN 49 (*)    Creatinine, Ser 2.70 (*)    Total Protein 8.5 (*)    GFR calc non Af Amer 27 (*)    GFR calc Af Amer 31 (*)    All other components within normal limits   ____________________________________________  PROCEDURES  Cipro 500 mg PO Tdap 0.5 ml IM Procedures ____________________________________________  INITIAL IMPRESSION / ASSESSMENT AND PLAN / ED COURSE  Patient with subsequent ED evaluation of left foot cellulitis. He has been taking clindamycin as directed. He presented for concern over continued redness. Labs are normal and exam is reassuring. We will add cipro for pseudomonas coverage. Wound care instructions and return precautions are reviewed.   EATHON VALADE was evaluated in Emergency Department on 10/11/2019 for the symptoms described in the history of present illness. He was evaluated in the context of the global COVID-19 pandemic, which necessitated consideration that the patient might be at risk for infection with the SARS-CoV-2 virus that causes COVID-19. Institutional protocols and algorithms that pertain to the evaluation of patients at risk for COVID-19 are in a state of rapid change based on information released by regulatory bodies including the CDC and federal and state organizations. These policies and algorithms were followed during the patient's care in the ED. ____________________________________________  FINAL CLINICAL IMPRESSION(S) / ED DIAGNOSES  Final diagnoses:  Cellulitis of left foot  Abscess of left foot      Carmie End, Dannielle Karvonen, PA-C 10/11/19 2031    Harvest Dark, MD 10/11/19 2246

## 2019-10-11 NOTE — Discharge Instructions (Addendum)
Keep the wound clean, dry, and covered. Apply gauze between the toes to promote healing. Avoid excessive water or wetness between the toes.  Take the Cipro along with the previously prescribed Clindamycin, as directed. Rest with the foot elevated to reduce swelling. Return to the ED as needed.

## 2019-10-11 NOTE — ED Triage Notes (Signed)
FIRST NURSE NOTE:  Pt states he is here for worsening infection in his foot, pt states "they gave me these pills, but they ain't working"  Pt in wheelchair on arrival. No distress noted.

## 2019-10-11 NOTE — ED Triage Notes (Signed)
Patient has wound present between 4th and 5th digit on left foot. Placed on clindamycin and reports he has been taking three days with no improvement.

## 2019-10-11 NOTE — ED Notes (Signed)
culture obtained by PA, po meds given and T.D to right arm

## 2019-10-11 NOTE — ED Notes (Signed)
Reports stepped on something 3 days ago and now has infection to left foot, seen in ed 3 days ago started on clindamycin. States taking for 3 days and does not see any improvements. Reports color of wound is worse drainage increased and so did the pain in that area. Awaiting to be seen by Md

## 2019-12-08 ENCOUNTER — Encounter: Payer: Self-pay | Admitting: Student in an Organized Health Care Education/Training Program

## 2019-12-09 ENCOUNTER — Ambulatory Visit
Payer: Medicare Other | Attending: Student in an Organized Health Care Education/Training Program | Admitting: Student in an Organized Health Care Education/Training Program

## 2019-12-09 ENCOUNTER — Encounter: Payer: Self-pay | Admitting: Student in an Organized Health Care Education/Training Program

## 2019-12-09 ENCOUNTER — Other Ambulatory Visit: Payer: Self-pay

## 2019-12-09 DIAGNOSIS — M170 Bilateral primary osteoarthritis of knee: Secondary | ICD-10-CM | POA: Diagnosis not present

## 2019-12-09 DIAGNOSIS — G894 Chronic pain syndrome: Secondary | ICD-10-CM | POA: Diagnosis not present

## 2019-12-09 DIAGNOSIS — Z96653 Presence of artificial knee joint, bilateral: Secondary | ICD-10-CM

## 2019-12-09 DIAGNOSIS — Q615 Medullary cystic kidney: Secondary | ICD-10-CM

## 2019-12-09 MED ORDER — OXYCODONE-ACETAMINOPHEN 10-325 MG PO TABS: 1.0000 | ORAL_TABLET | Freq: Three times a day (TID) | ORAL | 0 refills | Status: DC | PRN

## 2019-12-09 MED ORDER — MORPHINE SULFATE ER 15 MG PO TBCR: 15.0000 mg | EXTENDED_RELEASE_TABLET | Freq: Every day | ORAL | 0 refills | Status: DC

## 2019-12-09 NOTE — Progress Notes (Signed)
Patient: Nathan Ramos  Service Category: E/M  Provider: Gillis Santa, MD  DOB: 1971/03/24  DOS: 12/09/2019  Location: Office  MRN: [id number]  Setting: Ambulatory outpatient  Referring Provider: Arnetha Courser, MD  Type: Established Patient  Specialty: Interventional Pain Management  PCP: Arnetha Courser, MD  Location: Remote location  Delivery: TeleHealth     Virtual Encounter - Pain Management PROVIDER NOTE: Information contained herein reflects review and annotations entered in association with encounter. Interpretation of such information and data should be left to medically-trained personnel. Information provided to patient can be located elsewhere in the medical record under "Patient Instructions". Document created using STT-dictation technology, any transcriptional errors that may result from process are unintentional.    Contact & Pharmacy Preferred: [phone] Home: [phone] (home) Mobile: [phone] (mobile) E-mail: No e-mail address on record  [facility] [address], [place] [address] Phone: [phone] Fax: [phone]   Pre-screening  Nathan Ramos offered "in-person" vs "virtual" encounter. He indicated preferring virtual for this encounter.   Reason COVID-19*  Social distancing based on CDC and AMA recommendations.   I contacted Nathan Ramos on 12/09/2019 via telephone.      I clearly identified myself as Gillis Santa, MD. I verified that I was speaking with the correct person using two identifiers (Name: Nathan Ramos, and date of birth: 23-Nov-1970).  This visit was completed via telephone due to the restrictions of the COVID-19 pandemic. All issues as above were discussed and addressed but no physical exam was performed. If it was felt that the patient should be evaluated in the office, they were directed there. The patient verbally consented to this visit. Patient was unable to  complete an audio/visual visit due to Technical difficulties and/or Lack of internet. Due to the catastrophic nature of the COVID-19 pandemic, this visit was done through audio contact only.  Location of the patient: home address (see Epic for details)  Location of the provider: office Consent I sought verbal advanced consent from Nathan Ramos for virtual visit interactions. I informed Nathan Ramos of possible security and privacy concerns, risks, and limitations associated with providing "not-in-person" medical evaluation and management services. I also informed Nathan Ramos of the availability of "in-person" appointments. Finally, I informed him that there would be a charge for the virtual visit and that he could be  personally, fully or partially, financially responsible for it. Nathan Ramos expressed understanding and agreed to proceed.   Historic Elements   Nathan Ramos is a 49 y.o. year old, male patient evaluated today after his last encounter by our practice on 09/10/2019. Nathan Ramos  has a past medical history of Anemia, Bone spur of other site, Chronic pain, CKD (chronic kidney disease), Controlled substance agreement signed, Gout, Gouty arthritis, History of YAG laser capsulotomy of lens of left eye, Hyperlipidemia, Hypertension, IFG (impaired fasting glucose), Left knee DJD, Medullary cystic disease of the kidney, Primary localized osteoarthritis of right knee, and Smoker. 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 Nathan Ramos has a current medication list which includes the following prescription(s): amlodipine, atorvastatin, colchicine, febuxostat,  furosemide, losartan, [START ON 12/18/2019] morphine, [START ON 01/17/2020] morphine, [START ON 02/16/2020] morphine, naloxone, [START ON 12/18/2019] oxycodone-acetaminophen, [START ON 01/17/2020] oxycodone-acetaminophen, and [START ON 02/16/2020] oxycodone-acetaminophen. He  reports that he has been smoking cigarettes. He has a 11.00 pack-year smoking history. He has never used smokeless tobacco. He reports that he does not drink alcohol or use drugs. Nathan Ramos is allergic to nsaids.   HPI  Today, he is being contacted for medication management.   No change in medical history since last visit.  Patient's pain is at baseline.  Patient continues multimodal pain regimen as prescribed.  States that it provides pain relief and improvement in functional status.  Pharmacotherapy Assessment  Analgesic:  11/18/2019  1   09/10/2019  Oxycodone-Acetaminophen 10-325  90.00  30 Bi Lat   [id number]   [facility] (5798)   0  45.00 MME  Medicare   [place]  11/18/2019  1   09/10/2019  Morphine Sulf Er 15 MG Tablet  30.00  30 Bi Lat   [id number]   [facility] (5798)   0  15.00 MME  Medicare   [place]    Monitoring: Pharmacotherapy: No side-effects or adverse reactions reported. [place] PMP: PDMP reviewed during this encounter.       Compliance: No problems identified. Effectiveness: Clinically acceptable. Plan: Refer to "POC".  UDS:  Summary  Date Value Ref Range Status  09/10/2019 Note  Final    Comment:    ==================================================================== [facility] Select 13 (MW) ==================================================================== Test                             Result       Flag       Units Drug Present   Morphine                       4161                    ng/mg creat   Normorphine                    125                     ng/mg creat    Potential sources of large amounts of morphine in the absence of    codeine include administration of morphine or use of heroin.    Normorphine is an expected metabolite  of morphine.   Oxycodone                      889                     ng/mg creat   Oxymorphone                    3386                    ng/mg creat   Noroxycodone                   672                     ng/mg creat   Noroxymorphone                 523  ng/mg creat    Sources of oxycodone are scheduled prescription medications.    Oxymorphone, noroxycodone, and noroxymorphone are expected    metabolites of oxycodone. Oxymorphone is also available as a    scheduled prescription medication. ==================================================================== Test                      Result    Flag   Units      Ref Range   Creatinine              57               mg/dL      >=93 ==================================================================== Declared Medications:  Medication list was not provided. ==================================================================== For clinical consultation, please call [phone]. ====================================================================    Laboratory Chemistry Profile (12 mo)  Renal: 10/11/2019: BUN 49; Creatinine, Ser 2.70  Lab Results  Component Value Date   GFRAA 31 (L) 10/11/2019   GFRNONAA 27 (L) 10/11/2019   Hepatic: 10/11/2019: Albumin 4.4 Lab Results  Component Value Date   AST 18 10/11/2019   ALT 14 10/11/2019   Other: No results found for requested labs within last 8760 hours.  Note: Above Lab results reviewed.  Imaging  DG Foot Complete Left CLINICAL DATA:  Foot swelling, redness, stepped on pencil yesterday with puncture wound between the fourth and fifth toes  EXAM: LEFT FOOT - COMPLETE 3+ VIEW  COMPARISON:  None.  FINDINGS: No acute fracture or traumatic malalignment. There is diffuse swelling of the forefoot most pronounced near the fourth and fifth metatarsophalangeal joints, correlate with site of puncture wound. No soft tissue gas or foreign body is seen. Lateral  angulation across the second distal interphalangeal joint may be remote posttraumatic or developmental. 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 Midfoot and hindfoot alignment is grossly preserved though incompletely assessed on nonweightbearing films. Corticated os trigonum posteriorly.  IMPRESSION: 1. No soft tissue gas or foreign body. 2. Diffuse forefoot soft tissue swelling, most pronounced near the fourth and fifth metatarsophalangeal joints. Correlate with site of puncture wound. 3. 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 Alignment grossly preserved though incompletely assessed on nonweightbearing radiograph.  Electronically Signed   By: Kreg Shropshire M.D.   On: 10/07/2019 22:43   Assessment  The primary encounter diagnosis was Chronic pain syndrome. Diagnoses of Medullary cystic disease of the kidney, Primary osteoarthritis of both knees, and History of bilateral knee replacement were also pertinent to this visit.  Plan of Care    I am having Tibor L. Beeks start on oxyCODONE-acetaminophen, oxyCODONE-acetaminophen, morphine, and morphine. I am also having him maintain his Febuxostat, losartan, furosemide, amLODipine, naloxone, atorvastatin, colchicine, oxyCODONE-acetaminophen, and morphine.  Pharmacotherapy (Medications Ordered): Meds ordered this encounter  Medications  . oxyCODONE-acetaminophen (PERCOCET) 10-325 MG tablet    Sig: Take 1 tablet by mouth every 8 (eight) hours as needed for pain. Must last 30 days.    Dispense:  90 tablet    Refill:  0    [place] STOP ACT - Not applicable. Fill one day early if pharmacy is closed on scheduled refill date.  . morphine (MS CONTIN) 15 MG 12 hr tablet    Sig: Take 1 tablet (15 mg total) by mouth daily. Must last 30 days.    Dispense:  30 tablet    Refill:  0    [place] STOP ACT - Not  applicable. Fill one day early if pharmacy is closed on scheduled refill date.  [place] Kitchen oxyCODONE-acetaminophen (PERCOCET) 10-325 MG tablet    Sig: Take 1 tablet by mouth every 8 (eight) hours as needed for pain. Must last 30 days.    Dispense:  90 tablet    Refill:  0    [place] STOP ACT - Not applicable. Fill one day early if pharmacy is closed on scheduled refill date.  Marland Kitchen oxyCODONE-acetaminophen (PERCOCET) 10-325 MG tablet    Sig: Take 1 tablet by mouth every 8 (eight) hours as needed for pain. Must last 30 days.    Dispense:  90 tablet    Refill:  0    [place] STOP ACT - Not applicable. Fill one day early if pharmacy is closed on scheduled refill date.  . morphine (MS CONTIN) 15 MG 12 hr tablet    Sig: Take 1 tablet (15 mg total) by mouth daily. Must last 30 days.    Dispense:  30 tablet    Refill:  0    [place] STOP ACT - Not applicable. Fill one day early if pharmacy is closed on scheduled refill date.  . morphine (MS CONTIN) 15 MG 12 hr tablet    Sig: Take 1 tablet (15 mg total) by mouth daily. Must last 30 days.    Dispense:  30 tablet    Refill:  0    [place] STOP ACT - Not applicable. Fill one day early if pharmacy is closed on scheduled refill date.   Follow-up plan:   Return in about 3 months (around 03/08/2020) for Medication Management.   Recent Visits Date Type Provider Dept  09/10/19 Office Visit [name], MD [facility]  Showing recent visits within past 90 days and meeting all other requirements   Today's Visits Date Type Provider Dept  12/09/19 Office Visit [name], MD [facility]  Showing today's visits and meeting all other requirements   Future Appointments No visits were found meeting these conditions.  Showing future appointments within next 90 days and meeting all other requirements   I discussed the assessment and treatment plan with the patient. The patient was provided an opportunity to ask questions and all were answered. The patient agreed with  the plan and demonstrated an understanding of the instructions.  Patient advised to call back or seek an in-person evaluation if the symptoms or condition worsens.  Duration of encounter: 30 minutes.  Note by: [name], MD Date: 12/09/2019; Time: 2:36 PM

## 2019-12-17 ENCOUNTER — Other Ambulatory Visit: Payer: Self-pay | Admitting: Student in an Organized Health Care Education/Training Program

## 2020-01-22 ENCOUNTER — Ambulatory Visit: Payer: Medicare Other

## 2020-03-04 ENCOUNTER — Encounter: Payer: Self-pay | Admitting: Student in an Organized Health Care Education/Training Program

## 2020-03-08 ENCOUNTER — Encounter: Payer: Self-pay | Admitting: Student in an Organized Health Care Education/Training Program

## 2020-03-08 ENCOUNTER — Other Ambulatory Visit: Payer: Self-pay

## 2020-03-08 ENCOUNTER — Ambulatory Visit
Payer: Medicare Other | Attending: Student in an Organized Health Care Education/Training Program | Admitting: Student in an Organized Health Care Education/Training Program

## 2020-03-08 DIAGNOSIS — Q615 Medullary cystic kidney: Secondary | ICD-10-CM | POA: Diagnosis not present

## 2020-03-08 DIAGNOSIS — M170 Bilateral primary osteoarthritis of knee: Secondary | ICD-10-CM

## 2020-03-08 DIAGNOSIS — Z96653 Presence of artificial knee joint, bilateral: Secondary | ICD-10-CM

## 2020-03-08 DIAGNOSIS — G894 Chronic pain syndrome: Secondary | ICD-10-CM | POA: Diagnosis not present

## 2020-03-08 DIAGNOSIS — M1A39X1 Chronic gout due to renal impairment, multiple sites, with tophus (tophi): Secondary | ICD-10-CM | POA: Diagnosis not present

## 2020-03-08 MED ORDER — OXYCODONE-ACETAMINOPHEN 10-325 MG PO TABS: 1.0000 | ORAL_TABLET | Freq: Three times a day (TID) | ORAL | 0 refills | Status: DC | PRN

## 2020-03-08 MED ORDER — MORPHINE SULFATE ER 15 MG PO TBCR: 15.0000 mg | EXTENDED_RELEASE_TABLET | Freq: Every day | ORAL | 0 refills | Status: DC

## 2020-03-08 MED ORDER — PREDNISONE 20 MG PO TABS: ORAL_TABLET | ORAL | 0 refills | Status: AC

## 2020-03-08 NOTE — Progress Notes (Signed)
Patient: Nathan Ramos  Service Category: E/M  Provider: Gillis Santa, MD  DOB: [birth year]  DOS: 03/08/2020  Location: Office  MRN: [id number]  Setting: Ambulatory outpatient  Referring Provider: Arnetha Courser, MD  Type: Established Patient  Specialty: Interventional Pain Management  PCP: Arnetha Courser, MD  Location: Home  Delivery: TeleHealth     Virtual Encounter - Pain Management PROVIDER NOTE: Information contained herein reflects review and annotations entered in association with encounter. Interpretation of such information and data should be left to medically-trained personnel. Information provided to patient can be located elsewhere in the medical record under "Patient Instructions". Document created using STT-dictation technology, any transcriptional errors that may result from process are unintentional.    Contact & Pharmacy Preferred: [phone] Home: [phone] (home) Mobile: [phone] (mobile) E-mail: No e-mail address on record  [address] [place] [address] Phone: [phone] Fax: [phone]   Pre-screening  Nathan Ramos offered "in-person" vs "virtual" encounter. He indicated preferring virtual for this encounter.   Reason COVID-19*  Social distancing based on CDC and AMA recommendations.   I contacted Nathan Ramos on 03/08/2020 via telephone.      I clearly identified myself as Gillis Santa, MD. I verified that I was speaking with the correct person using two identifiers (Name: Nathan Ramos, and date of birth: 03/12/1971).  This visit was completed via telephone due to the restrictions of the COVID-19 pandemic. All issues as above were discussed and addressed but no physical exam was performed. If it was felt that the patient should be evaluated in the office, they were directed there. The patient verbally consented to this visit. Patient was unable to complete  an audio/visual visit due to Technical difficulties and/or Lack of internet. Due to the catastrophic nature of the COVID-19 pandemic, this visit was done through audio contact only.  Location of the patient: home address (see Epic for details)  Location of the provider: office Consent I sought verbal advanced consent from Nathan Ramos for virtual visit interactions. I informed Nathan Ramos of possible security and privacy concerns, risks, and limitations associated with providing "not-in-person" medical evaluation and management services. I also informed Nathan Ramos of the availability of "in-person" appointments. Finally, I informed him that there would be a charge for the virtual visit and that he could be  personally, fully or partially, financially responsible for it. Nathan Ramos expressed understanding and agreed to proceed.   Historic Elements   Nathan Ramos is a 49 y.o. year old, male patient evaluated today after his last contact with our practice on 12/17/2019. Nathan Ramos  has a past medical history of Anemia, Bone spur of other site, Chronic pain, CKD (chronic kidney disease), Controlled substance agreement signed, Gout, Gouty arthritis, History of YAG laser capsulotomy of lens of left eye, Hyperlipidemia, Hypertension, IFG (impaired fasting glucose), Left knee DJD, Medullary cystic disease of the kidney, Primary localized osteoarthritis of right knee, and Smoker. 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 Nathan Ramos has a current medication list which includes the following prescription(s): amlodipine, atorvastatin, colchicine, febuxostat, furosemide,  losartan, [START ON 03/16/2020] morphine, [START ON 04/15/2020] morphine, [START ON 05/15/2020] morphine, naloxone, [START ON 03/16/2020] oxycodone-acetaminophen, [START ON 04/15/2020] oxycodone-acetaminophen, [START ON 05/15/2020] oxycodone-acetaminophen, and prednisone. He  reports that he has been smoking cigarettes. He has a 11.00 pack-year smoking history. He has never used smokeless tobacco. He reports that he does not drink alcohol or use drugs. Nathan Ramos is allergic to nsaids.   HPI  Today, he is being contacted for medication management.   Having a gout flare that started last week.  States that it is very painful.  He also states that he will need to find a new primary care provider at his practice since Dr. Sanda Klein left.  Otherwise, patient continues his medications as prescribed including his long-acting morphine and oxycodone for breakthrough pain.  No side effects.  No constipation noted.  We will refill as below.   Pharmacotherapy Assessment  Analgesic: 02/16/2020  1   12/09/2019  Oxycodone-Acetaminophen 10-325  90.00  30 Bi Lat   [id number]   [facility] (5798)   0  45.00 MME  Medicare   [place]  02/16/2020  1   12/09/2019  Morphine Sulf Er 15 MG Tablet  30.00  30 Bi Lat   [id number]   [facility] (5798)   0  15.00 MME  Medicare   [place]     Monitoring: [place] PMP: PDMP reviewed during this encounter.       Pharmacotherapy: No side-effects or adverse reactions reported. Compliance: No problems identified. Effectiveness: Clinically acceptable. Plan: Refer to "POC".  UDS:  Summary  Date Value Ref Range Status  09/10/2019 Note  Final    Comment:    ==================================================================== [facility]ox[facility] Select 13 (MW) ==================================================================== Test                             Result       Flag       Units Drug Present   Morphine                       4161                    ng/mg creat   Normorphine                    125                     ng/mg creat     Potential sources of large amounts of morphine in the absence of    codeine include administration of morphine or use of heroin.    Normorphine is an expected metabolite of morphine.   Oxycodone                      889                     ng/mg creat   Oxymorphone                    3386                    ng/mg creat   Noroxycodone                   672                     ng/mg creat  Noroxymorphone                 523                     ng/mg creat    Sources of oxycodone are scheduled prescription medications.    Oxymorphone, noroxycodone, and noroxymorphone are expected    metabolites of oxycodone. Oxymorphone is also available as a    scheduled prescription medication. ==================================================================== Test                      Result    Flag   Units      Ref Range   Creatinine              57               mg/dL      >=20 ==================================================================== Declared Medications:  Medication list was not provided. ==================================================================== For clinical consultation, please call [phone]. ====================================================================    Laboratory Chemistry Profile   Renal Lab Results  Component Value Date   BUN 49 (H) 10/11/2019   CREATININE 2.70 (H) 10/11/2019   BCR 24 (H) 04/23/2018   GFRAA 31 (L) 10/11/2019   GFRNONAA 27 (L) 10/11/2019     Hepatic Lab Results  Component Value Date   AST 18 10/11/2019   ALT 14 10/11/2019   ALBUMIN 4.4 10/11/2019   ALKPHOS 93 10/11/2019     Electrolytes Lab Results  Component Value Date   NA 139 10/11/2019   K 4.4 10/11/2019   CL 104 10/11/2019   CALCIUM 9.7 10/11/2019     Bone No results found for: VD25OH, VD125OH2TOT, JF3545GY5, WL8937DS2, 25OHVITD1, 25OHVITD2, 25OHVITD3, TESTOFREE, TESTOSTERONE   Inflammation (CRP: Acute Phase) (ESR: Chronic Phase) No results found for: CRP,  ESRSEDRATE, LATICACIDVEN     Note: Above Lab results reviewed.  Imaging  DG Foot Complete Left CLINICAL DATA:  Foot swelling, redness, stepped on pencil yesterday with puncture wound between the fourth and fifth toes  EXAM: LEFT FOOT - COMPLETE 3+ VIEW  COMPARISON:  None.  FINDINGS: No acute fracture or traumatic malalignment. There is diffuse swelling of the forefoot most pronounced near the fourth and fifth metatarsophalangeal joints, correlate with site of puncture wound. No soft tissue gas or foreign body is seen. Lateral angulation across the second distal interphalangeal joint may be remote posttraumatic or developmental. 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 Midfoot and hindfoot alignment is grossly preserved though incompletely assessed on nonweightbearing films. Corticated os trigonum posteriorly.  IMPRESSION: 1. No soft tissue gas or foreign body. 2. Diffuse forefoot soft tissue swelling, most pronounced near the fourth and fifth metatarsophalangeal joints. Correlate with site of puncture wound. 3. 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 Alignment grossly preserved though incompletely assessed on nonweightbearing radiograph.  Electronically Signed   By: Lovena Le M.D.   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 Diagnoses of Chronic pain syndrome, Medullary cystic disease of the kidney, Primary osteoarthritis of both knees, and History of bilateral knee replacement were also pertinent to this visit.  Plan of Care  Nathan Ramos has a current medication list which includes the following long-term medication(s): amlodipine, atorvastatin, colchicine, furosemide, and losartan.  Gout flare: Colchicine as needed,  prednisone taper  as below. Chronic pain syndrome: Refill oxycodone and morphine as below.  UDS up-to-date.  Pharmacotherapy (Medications Ordered): Meds ordered this encounter  Medications  . oxyCODONE-acetaminophen (PERCOCET) 10-325 MG tablet    Sig: Take 1 tablet by mouth every 8 (eight) hours as needed for pain. Must last 30 days.    Dispense:  90 tablet    Refill:  0    [place] STOP ACT - Not applicable. Fill one day early if pharmacy is closed on scheduled refill date.  . morphine (MS CONTIN) 15 MG 12 hr tablet    Sig: Take 1 tablet (15 mg total) by mouth daily. Must last 30 days.    Dispense:  30 tablet    Refill:  0    [place] STOP ACT - Not applicable. Fill one day early if pharmacy is closed on scheduled refill date.  . morphine (MS CONTIN) 15 MG 12 hr tablet    Sig: Take 1 tablet (15 mg total) by mouth daily. Must last 30 days.    Dispense:  30 tablet    Refill:  0    [place] STOP ACT - Not applicable. Fill one day early if pharmacy is closed on scheduled refill date.  . morphine (MS CONTIN) 15 MG 12 hr tablet    Sig: Take 1 tablet (15 mg total) by mouth daily. Must last 30 days.    Dispense:  30 tablet    Refill:  0    [place] STOP ACT - Not applicable. Fill one day early if pharmacy is closed on scheduled refill date.  [place] Kitchen oxyCODONE-acetaminophen (PERCOCET) 10-325 MG tablet    Sig: Take 1 tablet by mouth every 8 (eight) hours as needed for pain. Must last 30 days.    Dispense:  90 tablet    Refill:  0    [place] STOP ACT - Not applicable. Fill one day early if pharmacy is closed on scheduled refill date.  Marland Kitchen oxyCODONE-acetaminophen (PERCOCET) 10-325 MG tablet    Sig: Take 1 tablet by mouth every 8 (eight) hours as needed for pain. Must last 30 days.    Dispense:  90 tablet    Refill:  0    [place] STOP ACT - Not applicable. Fill one day early if pharmacy is closed on scheduled refill date.  . predniSONE (DELTASONE) 20 MG tablet    Sig: Take 3 tablets (60 mg total) by mouth daily with breakfast for 3  days, THEN 2 tablets (40 mg total) daily with breakfast for 3 days, THEN 1 tablet (20 mg total) daily with breakfast for 3 days.    Dispense:  18 tablet    Refill:  0   Follow-up plan:   Return in about 3 months (around 06/07/2020) for Medication Management, in person.    Recent Visits Date Type Provider Dept  12/09/19 Office Visit Gillis Santa, MD [facility]  Showing recent visits within past 90 days and meeting all other requirements   Today's Visits Date Type Provider Dept  03/08/20 Telemedicine Gillis Santa, MD [facility]  Showing today's visits and meeting all other requirements   Future Appointments No visits were found meeting these conditions.  Showing future appointments within next 90 days and meeting all other requirements   I discussed the assessment and treatment plan with the patient. The patient was provided an opportunity to ask questions and all were answered. The patient agreed with the plan and demonstrated an understanding of the instructions.  Patient advised to call back  or seek an in-person evaluation if the symptoms or condition worsens.  Duration of encounter: 43mnutes.  Note by: BGillis Santa MD Date: 03/08/2020; Time: 2:16 PM

## 2020-04-28 ENCOUNTER — Encounter: Payer: Self-pay | Admitting: Emergency Medicine

## 2020-04-28 ENCOUNTER — Emergency Department
Admission: EM | Admit: 2020-04-28 | Discharge: 2020-04-28 | Disposition: A | Discharge status: Home / Self Care | Payer: Medicare Other | Attending: Emergency Medicine | Admitting: Emergency Medicine

## 2020-04-28 ENCOUNTER — Other Ambulatory Visit: Payer: Self-pay

## 2020-04-28 DIAGNOSIS — Z96653 Presence of artificial knee joint, bilateral: Secondary | ICD-10-CM | POA: Insufficient documentation

## 2020-04-28 DIAGNOSIS — S60444A External constriction of right ring finger, initial encounter: Secondary | ICD-10-CM | POA: Diagnosis not present

## 2020-04-28 DIAGNOSIS — F17210 Nicotine dependence, cigarettes, uncomplicated: Secondary | ICD-10-CM | POA: Diagnosis not present

## 2020-04-28 DIAGNOSIS — W4904XA Ring or other jewelry causing external constriction, initial encounter: Secondary | ICD-10-CM | POA: Diagnosis not present

## 2020-04-28 DIAGNOSIS — S60449A External constriction of unspecified finger, initial encounter: Secondary | ICD-10-CM

## 2020-04-28 DIAGNOSIS — I129 Hypertensive chronic kidney disease with stage 1 through stage 4 chronic kidney disease, or unspecified chronic kidney disease: Secondary | ICD-10-CM | POA: Diagnosis not present

## 2020-04-28 DIAGNOSIS — Y999 Unspecified external cause status: Secondary | ICD-10-CM | POA: Insufficient documentation

## 2020-04-28 DIAGNOSIS — Y9289 Other specified places as the place of occurrence of the external cause: Secondary | ICD-10-CM | POA: Insufficient documentation

## 2020-04-28 DIAGNOSIS — Z79899 Other long term (current) drug therapy: Secondary | ICD-10-CM | POA: Insufficient documentation

## 2020-04-28 DIAGNOSIS — Y9389 Activity, other specified: Secondary | ICD-10-CM | POA: Insufficient documentation

## 2020-04-28 DIAGNOSIS — S6991XA Unspecified injury of right wrist, hand and finger(s), initial encounter: Secondary | ICD-10-CM | POA: Diagnosis present

## 2020-04-28 DIAGNOSIS — N183 Chronic kidney disease, stage 3 unspecified: Secondary | ICD-10-CM | POA: Diagnosis not present

## 2020-04-28 MED ORDER — LIDOCAINE HCL (PF) 1 % IJ SOLN
5.0000 mL | Freq: Once | INTRAMUSCULAR | Status: AC
  Administered 2020-04-28: 5 mL
  Filled 2020-04-28: qty 5

## 2020-04-28 MED ORDER — HYDROCODONE-ACETAMINOPHEN 5-325 MG PO TABS
1.0000 | ORAL_TABLET | Freq: Once | ORAL | Status: DC
  Filled 2020-04-28: qty 1

## 2020-04-28 NOTE — ED Provider Notes (Addendum)
[facility] Emergency Department Provider Note ____________________________________________  Time seen: 1416  I have reviewed the triage vital signs and the nursing notes.  HISTORY  Chief Complaint  Foreign Body  HPI Nathan Ramos is a 49 y.o. male presents to the ED with request to have a ring removed from his finger.  Patient presents with  a stainless steele fashion ring that is stuck on his right hand ring finger.  He had been evaluated at the local fire department as well as a jeweler's office, with attempts to remove the ring without success.  He denies any other injury at this time.  Past Medical History:  Diagnosis Date  . Anemia    of chronic renal disease  . Bone spur of other site    right shoulder, managed by ortho  . Chronic pain    Nathan Ramos CFNP at [facility]  . CKD (chronic kidney disease)    stage III 03/2014 (Dr. Mosetta Pigeon)  . Controlled substance agreement signed    signed 06/02/15  . Gout   . Gouty arthritis    knee, managed by Ortho  . History of YAG laser capsulotomy of lens of left eye   . Hyperlipidemia   . Hypertension   . IFG (impaired fasting glucose)   . Left knee DJD   . 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 Primary localized osteoarthritis of right knee   . Smoker     Patient Active Problem List   Diagnosis Date Noted  . Hyperglycemia 10/23/2018  . Morbid obesity (HCC) 10/23/2018  . Low HDL (under 40) 12/21/2017  . Opioid use 10/11/2017  . Vitamin D deficiency 03/30/2017  . Chronic pain syndrome 09/19/2016  . Tobacco abuse 06/05/2016  . Obesity 03/05/2016  . Trigger ring finger of right hand 12/24/2015  . Wrist pain, right 11/26/2015  . Medication monitoring encounter 11/26/2015  . Controlled substance agreement signed   . Chronic kidney disease, stage III (moderate) (HCC)   . Anemia   . Hypertriglyceridemia   . Hx of impaired glucose tolerance   .  Gouty arthritis   . Primary localized osteoarthritis of right knee   . Gout due to renal impairment 03/30/2014  . DJD (degenerative joint disease) of knee 03/30/2014  . Medullary cystic disease of the kidney   . Left knee DJD   . Chronic pain   . Hypertension     Past Surgical History:  Procedure Laterality Date  . CARPAL TUNNEL RELEASE Left   . CATARACT EXTRACTION W/ INTRAOCULAR LENS IMPLANT Left 2008  . CATARACT EXTRACTION W/ INTRAOCULAR LENS IMPLANT Right 2008  . EYE SURGERY    . HERNIA REPAIR    . JOINT REPLACEMENT Left 03/2014  . TOTAL KNEE ARTHROPLASTY Left 03/30/2014   Procedure: TOTAL KNEE ARTHROPLASTY;  Surgeon: Nilda Simmer, MD;  Location: [facility] OR;  Service: Orthopedics;  Laterality: Left;  . TOTAL KNEE ARTHROPLASTY Right 10/26/2014   Procedure: TOTAL KNEE ARTHROPLASTY;  Surgeon: Nilda Simmer, MD;  Location: [facility] OR;  Service: Orthopedics;  Laterality: Right;  . UMBILICAL HERNIA REPAIR  2010    Prior to Admission medications   Medication Sig Start Date End Date Taking? Authorizing Provider  amLODipine (NORVASC) 10 MG tablet Take 1 tablet (10 mg total) by mouth daily. 11/09/16   Kerman Passey, MD  atorvastatin (LIPITOR) 40 MG tablet Take 1 tablet (40 mg total) by mouth at bedtime. For  cholesterol 04/24/18   Lada, Satira Anis, MD  colchicine 0.6 MG tablet Take 0.6 mg by mouth as needed.    [provider]  Febuxostat (ULORIC) 80 MG TABS Take 1 tablet by mouth every other day.     [provider]  furosemide (LASIX) 40 MG tablet Take 1 tablet (40 mg total) by mouth every other day. 06/05/16   Lada, Satira Anis, MD  losartan (COZAAR) 50 MG tablet TAKE 1 TABLET BY MOUTH EVERY DAY 05/18/16   Lada, Satira Anis, MD  morphine (MS CONTIN) 15 MG 12 hr tablet Take 1 tablet (15 mg total) by mouth daily. Must last 30 days. 04/15/20 05/15/20  Gillis Santa, MD  morphine (MS CONTIN) 15 MG 12 hr tablet Take 1 tablet (15 mg total) by mouth daily. Must last 30 days. 05/15/20 06/14/20   Gillis Santa, MD  naloxone [facility]) nasal spray 4 mg/0.1 mL Use in case of accidental overdose with narcotics / opioids 10/26/17   Lada, Satira Anis, MD  oxyCODONE-acetaminophen (PERCOCET) 10-325 MG tablet Take 1 tablet by mouth every 8 (eight) hours as needed for pain. Must last 30 days. 04/15/20 05/15/20  Gillis Santa, MD  oxyCODONE-acetaminophen (PERCOCET) 10-325 MG tablet Take 1 tablet by mouth every 8 (eight) hours as needed for pain. Must last 30 days. 05/15/20 06/14/20  Gillis Santa, MD    Allergies Nsaids  Family History  Problem Relation Age of Onset  . COPD Mother   . Kidney disease Mother   . Thyroid disease Mother   . Stroke Father 27  . COPD Maternal Grandmother     Social History Social History   Tobacco Use  . Smoking status: Current Every Day Smoker    Packs/day: 0.50    Years: 22.00    Pack years: 11.00    Types: Cigarettes  . Smokeless tobacco: Never Used  Vaping Use  . Vaping Use: Never used  Substance Use Topics  . Alcohol use: No  . Drug use: No    Review of Systems  Constitutional: Negative for fever. Cardiovascular: Negative for chest pain. Musculoskeletal: Negative for back pain. Skin: Negative for rash. Neurological: Negative for headaches, focal weakness or numbness. ____________________________________________  PHYSICAL EXAM:  VITAL SIGNS: ED Triage Vitals  Enc Vitals Group     BP 04/28/20 1339 126/78     Pulse Rate 04/28/20 1339 (!) 104     Resp 04/28/20 1339 18     Temp 04/28/20 1339 99.1 F (37.3 C)     Temp Source 04/28/20 1339 Oral     SpO2 04/28/20 1339 96 %     Weight 04/28/20 1331 250 lb (113.4 kg)     Height 04/28/20 1331 5\' 9"  (1.753 m)     Head Circumference --      Peak Flow --      Pain Score 04/28/20 1331 7     Pain Loc --      Pain Edu? --      Excl. in [place]? --     Constitutional: Alert and oriented. Well appearing and in no distress. Head: Normocephalic and atraumatic. Eyes: Conjunctivae are normal. Normal  extraocular movements Cardiovascular: Normal rate, regular rhythm. Normal distal pulses. Respiratory: Normal respiratory effort.  Musculoskeletal: Nontender with normal range of motion in all extremities.  Neurologic:  Normal gait without ataxia. Normal speech and language. No gross focal neurologic deficits are appreciated. Skin:  Skin is warm, dry and intact. No rash noted.  Right hand ring finger with a  large silver tone ring in place.  There is local finger edema over the proximal phalanx.  Is also a blister and callus to the proximal radial aspect of the middle phalanx. ____________________________________________  PROCEDURES  Foreign body removal: The stainless steel ring is removed after about 45 minutes with a Dremel tool to make 2 separate cuts to the shank of the ring.  Procedures ____________________________________________  INITIAL IMPRESSION / ASSESSMENT AND PLAN / ED COURSE  Patient with ED evaluation and request for removal of a tight stainless steel ring to the right ring finger.  We were able to remove the ring using a Dremel tool.  LADARIOUS KRESSE was evaluated in Emergency Department on 04/28/2020 for the symptoms described in the history of present illness. He was evaluated in the context of the global COVID-19 pandemic, which necessitated consideration that the patient might be at risk for infection with the SARS-CoV-2 virus that causes COVID-19. Institutional protocols and algorithms that pertain to the evaluation of patients at risk for COVID-19 are in a state of rapid change based on information released by regulatory bodies including the CDC and federal and state organizations. These policies and algorithms were followed during the patient's care in the ED. ____________________________________________  FINAL CLINICAL IMPRESSION(S) / ED DIAGNOSES  Final diagnoses:  Tight ring on finger      Annaleah Arata, Charlesetta Ivory, PA-C 04/28/20 1910    Karmen Stabs, Charlesetta Ivory,  PA-C 04/28/20 1911    Sharyn Creamer, MD 04/30/20 1551

## 2020-04-28 NOTE — Discharge Instructions (Addendum)
We successfully removed your stainless steele ring using the Dremel tool. Keep the wound clean, dry, and covered. Follow-up with your provider or return as needed.

## 2020-04-28 NOTE — ED Triage Notes (Signed)
Pt reports ring stuck on right hand ring finger and needs it cut off.

## 2020-06-01 ENCOUNTER — Encounter: Payer: Self-pay | Admitting: Student in an Organized Health Care Education/Training Program

## 2020-06-01 ENCOUNTER — Ambulatory Visit
Payer: Medicare Other | Attending: Student in an Organized Health Care Education/Training Program | Admitting: Student in an Organized Health Care Education/Training Program

## 2020-06-01 ENCOUNTER — Other Ambulatory Visit: Payer: Self-pay

## 2020-06-01 VITALS — BP 159/101 | HR 92 | Temp 98.2°F | Resp 18 | Ht 68.0 in | Wt 252.8 lb

## 2020-06-01 DIAGNOSIS — M170 Bilateral primary osteoarthritis of knee: Secondary | ICD-10-CM

## 2020-06-01 DIAGNOSIS — M1A39X1 Chronic gout due to renal impairment, multiple sites, with tophus (tophi): Secondary | ICD-10-CM | POA: Diagnosis present

## 2020-06-01 DIAGNOSIS — Q615 Medullary cystic kidney: Secondary | ICD-10-CM

## 2020-06-01 DIAGNOSIS — G894 Chronic pain syndrome: Secondary | ICD-10-CM | POA: Diagnosis present

## 2020-06-01 DIAGNOSIS — Z96653 Presence of artificial knee joint, bilateral: Secondary | ICD-10-CM | POA: Diagnosis present

## 2020-06-01 DIAGNOSIS — Z79899 Other long term (current) drug therapy: Secondary | ICD-10-CM | POA: Diagnosis present

## 2020-06-01 DIAGNOSIS — E781 Pure hyperglyceridemia: Secondary | ICD-10-CM

## 2020-06-01 MED ORDER — OXYCODONE-ACETAMINOPHEN 10-325 MG PO TABS: 1.0000 | ORAL_TABLET | Freq: Three times a day (TID) | ORAL | 0 refills | Status: DC | PRN

## 2020-06-01 MED ORDER — MORPHINE SULFATE ER 15 MG PO TBCR: 15.0000 mg | EXTENDED_RELEASE_TABLET | Freq: Every day | ORAL | 0 refills | Status: DC

## 2020-06-01 MED ORDER — PREDNISONE 20 MG PO TABS: ORAL_TABLET | ORAL | 0 refills | Status: AC

## 2020-06-01 NOTE — Progress Notes (Signed)
Nursing Pain Medication Assessment:  Safety precautions to be maintained throughout the outpatient stay will include: orient to surroundings, keep bed in low position, maintain call bell within reach at all times, provide assistance with transfer out of bed and ambulation.  Medication Inspection Compliance: Pill count conducted under aseptic conditions, in front of the patient. Neither the pills nor the bottle was removed from the patient's sight at any time. Once count was completed pills were immediately returned to the patient in their original bottle.  Medication: Morphine ER (MSContin) Pill/Patch Count: 12 of 30 pills remain Pill/Patch Appearance: Markings consistent with prescribed medication Bottle Appearance: Standard pharmacy container. Clearly labeled. Filled Date:07 / 03 / 2021 Last Medication intake:  Today   Oxycodone/acetaminophen  10/325 mg 36/90 Filled 05-15-2020 Last took today

## 2020-06-01 NOTE — Progress Notes (Signed)
PROVIDER NOTE: Information contained herein reflects review and annotations entered in association with encounter. Interpretation of such information and data should be left to medically-trained personnel. Information provided to patient can be located elsewhere in the medical record under "Patient Instructions". Document created using STT-dictation technology, any transcriptional errors that may result from process are unintentional.    Patient: Nathan Ramos  Service Category: E/M  Provider: Gillis Santa, MD  DOB: [birth year]  DOS: 06/01/2020  Specialty: Interventional Pain Management  MRN: [id number]  Setting: Ambulatory outpatient  PCP: [facility]  Type: Established Patient    Referring Provider: Arnetha Courser, MD  Location: Office  Delivery: Face-to-face     HPI  Reason for encounter: Nathan Ramos, a 49 y.o. year old male, is here today for evaluation and management of his Chronic pain syndrome [G89.4]. Nathan Ramos primary complain today is Hand Pain (right) Last encounter: Practice (12/17/2019). My last encounter with him was on 12/17/2019. Pertinent problems: Nathan Ramos has Medullary cystic disease of the kidney; Chronic pain; Gout due to renal impairment; Primary localized osteoarthritis of right knee; Chronic pain syndrome; and Opioid use on their pertinent problem list. Pain Assessment: Severity of Chronic pain is reported as a 6 /10. Location: Hand Right/denies. Onset: More than a month ago. Quality: Aching, Throbbing, Dull, Sharp, Shooting. Timing: Intermittent. Modifying factor(s): denies. Vitals:  height is 5' 8"  (1.727 m) and weight is 252 lb 12.8 oz (114.7 kg). His temperature is 98.2 F (36.8 C). His blood pressure is 159/101 (abnormal) and his pulse is 92. His respiration is 18 and oxygen saturation is 99%.   No change in medical history since last visit.  Patient's pain is at baseline.  Patient continues multimodal pain regimen as prescribed.  States that it  provides pain relief and improvement in functional status.  States that he still needs to find a new PCP since Dr. Sanda Ramos left.  We will obtain blood work as below.  Pharmacotherapy Assessment   05/15/2020  1   03/08/2020  Oxycodone-Acetaminophen 10-325  90.00  30 Bi Lat   [id number]   [facility] ([id number])   0/0  45.00 MME  Medicare   [place]  05/15/2020  1   03/08/2020  Morphine Sulf Er 15 MG Tablet  30.00  30 Bi Lat   [id number]   [facility] ([id number])   0/0  15.00 MME  Medicare   [place]     Analgesic: Percocet 10 mg 3 times daily as needed, quantity 90/month, MME 45, morphine 15 mg daily, MME: 15  Monitoring: [place] PMP: PDMP reviewed during this encounter.       Pharmacotherapy: No side-effects or adverse reactions reported. Compliance: No problems identified. Effectiveness: Clinically acceptable.  Dewayne Shorter, RN  06/01/2020 11:41 AM  Signed Nursing Pain Medication Assessment:  Safety precautions to be maintained throughout the outpatient stay will include: orient to surroundings, keep bed in low position, maintain call bell within reach at all times, provide assistance with transfer out of bed and ambulation.  Medication Inspection Compliance: Pill count conducted under aseptic conditions, in front of the patient. Neither the pills nor the bottle was removed from the patient's sight at any time. Once count was completed pills were immediately returned to the patient in their original bottle.  Medication: Morphine ER (MSContin) Pill/Patch Count: 12 of 30 pills remain Pill/Patch Appearance: Markings consistent with prescribed medication Bottle Appearance: Standard pharmacy container. Clearly labeled. Filled Date:07 / 03 / 2021 Last Medication intake:  Today   Oxycodone/acetaminophen  10/325 mg 36/90 Filled 05-15-2020 Last took today      UDS:  Summary  Date Value Ref Range Status  09/10/2019 Note  Final    Comment:    ==================================================================== [employer] [facility] 13  (MW) ==================================================================== Test                             Result       Flag       Units Drug Present   Morphine                       4161                    ng/mg creat   Normorphine                    125                     ng/mg creat    Potential sources of large amounts of morphine in the absence of    codeine include administration of morphine or use of heroin.    Normorphine is an expected metabolite of morphine.   Oxycodone                      889                     ng/mg creat   Oxymorphone                    3386                    ng/mg creat   Noroxycodone                   672                     ng/mg creat   Noroxymorphone                 523                     ng/mg creat    Sources of oxycodone are scheduled prescription medications.    Oxymorphone, noroxycodone, and noroxymorphone are expected    metabolites of oxycodone. Oxymorphone is also available as a    scheduled prescription medication. ==================================================================== Test                      Result    Flag   Units      Ref Range   Creatinine              57               mg/dL      >=20 ==================================================================== Declared Medications:  Medication list was not provided. ==================================================================== For clinical consultation, please call [phone]. ====================================================================      ROS  Constitutional: Denies any fever or chills Gastrointestinal: No reported hemesis, hematochezia, vomiting, or acute GI distress Musculoskeletal: Denies any acute onset joint swelling, redness, loss of ROM, or weakness Neurological: No reported episodes of acute onset apraxia, aphasia, dysarthria, agnosia, amnesia, paralysis, loss of coordination, or loss of consciousness  Medication Review  Febuxostat,  amLODipine, atorvastatin, colchicine, furosemide, losartan, morphine, naloxone, oxyCODONE-acetaminophen, and predniSONE  History Review  Allergy: Nathan Ramos is allergic to nsaids. Drug: Nathan Ramos  reports no history of drug use. Alcohol:  reports no history of alcohol use. Tobacco:  reports that he has been smoking cigarettes. He has a 11.00 pack-year smoking history. He has never used smokeless tobacco. Social: Nathan Ramos  reports that he has been smoking cigarettes. He has a 11.00 pack-year smoking history. He has never used smokeless tobacco. He reports that he does not drink alcohol and does not use drugs. Medical:  has a past medical history of Anemia, Bone spur of other site, Chronic pain, CKD (chronic kidney disease), Controlled substance agreement signed, Gout, Gouty arthritis, History of YAG laser capsulotomy of lens of left eye, Hyperlipidemia, Hypertension, IFG (impaired fasting glucose), Left knee DJD, Medullary cystic disease of the kidney, Primary localized osteoarthritis of right knee, and Smoker. Surgical: Nathan Ramos  has a past surgical history that includes Carpal tunnel release (Left); Umbilical hernia repair (2010); Cataract extraction w/ intraocular lens implant (Left, 2008); Cataract extraction w/ intraocular lens implant (Right, 2008); Hernia repair; Total knee arthroplasty (Left, 03/30/2014); Eye surgery; Total knee arthroplasty (Right, 10/26/2014); and Joint replacement (Left, 03/2014). Family: family history includes COPD in his maternal grandmother and mother; Kidney disease in his mother; Stroke (age of onset: 25) in his father; Thyroid disease in his mother.  Laboratory Chemistry Profile   Renal Lab Results  Component Value Date   BUN 49 (H) 10/11/2019   CREATININE 2.70 (H) 10/11/2019   BCR 24 (H) 04/23/2018   GFRAA 31 (L) 10/11/2019   GFRNONAA 27 (L) 10/11/2019     Hepatic Lab Results  Component Value Date   AST 18 10/11/2019   ALT 14 10/11/2019   ALBUMIN 4.4  10/11/2019   ALKPHOS 93 10/11/2019     Electrolytes Lab Results  Component Value Date   NA 139 10/11/2019   K 4.4 10/11/2019   CL 104 10/11/2019   CALCIUM 9.7 10/11/2019     Bone No results found for: VD25OH, VD125OH2TOT, BL3903ES9, QZ3007MA2, 25OHVITD1, 25OHVITD2, 25OHVITD3, TESTOFREE, TESTOSTERONE   Inflammation (CRP: Acute Phase) (ESR: Chronic Phase) No results found for: CRP, ESRSEDRATE, LATICACIDVEN     Note: Above Lab results reviewed.  Recent Imaging Review  DG Foot Complete Left CLINICAL DATA:  Foot swelling, redness, stepped on pencil yesterday with puncture wound between the fourth and fifth toes  EXAM: LEFT FOOT - COMPLETE 3+ VIEW  COMPARISON:  None.  FINDINGS: No acute fracture or traumatic malalignment. There is diffuse swelling of the forefoot most pronounced near the fourth and fifth metatarsophalangeal joints, correlate with site of puncture wound. No soft tissue gas or foreign body is seen. Lateral angulation across the second distal interphalangeal joint may be remote posttraumatic or developmental. 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 Midfoot and hindfoot alignment is grossly preserved though incompletely assessed on nonweightbearing films. Corticated os trigonum posteriorly.  IMPRESSION: 1. No soft tissue gas or foreign body. 2. Diffuse forefoot soft tissue swelling, most pronounced near the fourth and fifth metatarsophalangeal joints. Correlate with site of puncture wound. 3. 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 Alignment grossly preserved though incompletely assessed on nonweightbearing radiograph.  Electronically Signed   By: Lovena Le M.D.   On: 10/07/2019 22:43 Note: Reviewed        Physical Exam  General appearance: Well nourished, well developed, and well hydrated. In no  apparent acute distress Mental status: Alert, oriented x 3 (person, place, & time)       Respiratory: No evidence of acute respiratory distress Eyes: PERLA Vitals: BP (!) 159/101   Pulse 92   Temp 98.2 F (36.8 C)   Resp 18   Ht 5' 8"  (1.727 m)   Wt 252 lb 12.8 oz (114.7 kg)   SpO2 99%   BMI 38.44 kg/m  BMI: Estimated body mass index is 38.44 kg/m as calculated from the following:   Height as of this encounter: 5' 8"  (1.727 m).   Weight as of this encounter: 252 lb 12.8 oz (114.7 kg). Ideal: Ideal body weight: 68.4 kg (150 lb 12.7 oz) Adjusted ideal body weight: 86.9 kg (191 lb 9.5 oz)  Upper Extremity (UE) Exam    Side: Right upper extremity  Side: Left upper extremity  Skin & Extremity Inspection: Skin color, temperature, and hair growth are WNL. No peripheral edema or cyanosis. No masses, redness, swelling, asymmetry, or associated skin lesions. No contractures.  Skin & Extremity Inspection: Skin color, temperature, and hair growth are WNL. No peripheral edema or cyanosis. No masses, redness, swelling, asymmetry, or associated skin lesions. No contractures.  Functional ROM: Unrestricted ROM          Functional ROM: Unrestricted ROM          Muscle Tone/Strength: Functionally intact. No obvious neuro-muscular anomalies detected.  Muscle Tone/Strength: Functionally intact. No obvious neuro-muscular anomalies detected.  Sensory (Neurological): Unimpaired          Sensory (Neurological): Unimpaired          Palpation: No palpable anomalies              Palpation: No palpable anomalies              Provocative Test(s):  Phalen's test: deferred Tinel's test: deferred Apley's scratch test (touch opposite shoulder):  Action 1 (Across chest): deferred Action 2 (Overhead): deferred Action 3 (LB reach): deferred   Provocative Test(s):  Phalen's test: deferred Tinel's test: deferred Apley's scratch test (touch opposite shoulder):  Action 1 (Across chest): deferred Action 2 (Overhead):  deferred Action 3 (LB reach): deferred    Thoracic Spine Area Exam  Skin & Axial Inspection: No masses, redness, or swelling Alignment: Symmetrical Functional ROM: Unrestricted ROM Stability: No instability detected Muscle Tone/Strength: Functionally intact. No obvious neuro-muscular anomalies detected. Sensory (Neurological): Musculoskeletal pain pattern Muscle strength & Tone: No palpable anomalies   Lumbar Spine Area Exam  Skin & Axial Inspection: No masses, redness, or swelling Alignment: Symmetrical Functional ROM: Unrestricted ROM       Stability: No instability detected Muscle Tone/Strength: Functionally intact. No obvious neuro-muscular anomalies detected. Sensory (Neurological): Musculoskeletal pain pattern  Lower Extremity Exam    Side: Right lower extremity  Side: Left lower extremity  Stability: No instability observed          Stability: No instability observed          Skin & Extremity Inspection: Skin color, temperature, and hair growth are WNL. No peripheral edema or cyanosis. No masses, redness, swelling, asymmetry, or associated skin lesions. No contractures.  Skin & Extremity Inspection: Skin color, temperature, and hair growth are WNL. No peripheral edema or cyanosis. No masses, redness, swelling, asymmetry, or associated skin lesions. No contractures.  Functional ROM: Unrestricted ROM                  Functional ROM: Unrestricted ROM  Muscle Tone/Strength: Functionally intact. No obvious neuro-muscular anomalies detected.  Muscle Tone/Strength: Functionally intact. No obvious neuro-muscular anomalies detected.  Sensory (Neurological): Musculoskeletal pain pattern        Sensory (Neurological): Musculoskeletal pain pattern        DTR: Patellar: deferred today Achilles: deferred today Plantar: deferred today  DTR: Patellar: deferred today Achilles: deferred today Plantar: deferred today  Palpation: No palpable anomalies  Palpation: No palpable  anomalies    Assessment   Status Diagnosis  Controlled Controlled Controlled 1. Chronic pain syndrome   2. Controlled substance agreement signed   3. 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 Medullary cystic disease of the kidney   5. Primary osteoarthritis of both knees   6. History of bilateral knee replacement   7. 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  Pharmacotherapy (Medications Ordered): Meds ordered this encounter  Medications  . oxyCODONE-acetaminophen (PERCOCET) 10-325 MG tablet    Sig: Take 1 tablet by mouth every 8 (eight) hours as needed for pain. Must last 30 days.    Dispense:  90 tablet    Refill:  0    [employer] STOP ACT - Not applicable. Fill one day early if pharmacy is closed on scheduled refill date.  Marland Kitchen oxyCODONE-acetaminophen (PERCOCET) 10-325 MG tablet    Sig: Take 1 tablet by mouth every 8 (eight) hours as needed for pain. Must last 30 days.    Dispense:  90 tablet    Refill:  0    [place] STOP ACT - Not applicable. Fill one day early if pharmacy is closed on scheduled refill date.  Marland Kitchen oxyCODONE-acetaminophen (PERCOCET) 10-325 MG tablet    Sig: Take 1 tablet by mouth every 8 (eight) hours as needed for pain. Must last 30 days.    Dispense:  90 tablet    Refill:  0    [place] STOP ACT - Not applicable. Fill one day early if pharmacy is closed on scheduled refill date.  . morphine (MS CONTIN) 15 MG 12 hr tablet    Sig: Take 1 tablet (15 mg total) by mouth  daily. Must last 30 days.    Dispense:  30 tablet    Refill:  0    [place] STOP ACT - Not applicable. Fill one day early if pharmacy is closed on scheduled refill date.  . morphine (MS CONTIN) 15 MG 12 hr tablet    Sig: Take 1 tablet (15 mg total) by mouth daily. Must last 30 days.    Dispense:  30 tablet    Refill:  0    [place] STOP ACT - Not applicable. Fill one day early if pharmacy is closed on scheduled refill date.  . morphine (MS CONTIN) 15 MG 12 hr tablet    Sig: Take 1 tablet (15 mg total) by mouth daily. Must last 30 days.    Dispense:  30 tablet    Refill:  0    [place] STOP ACT -  Not applicable. Fill one day early if pharmacy is closed on scheduled refill date.  . predniSONE (DELTASONE) 20 MG tablet    Sig: Take 3 tablets (60 mg total) by mouth daily with breakfast for 3 days, THEN 2 tablets (40 mg total) daily with breakfast for 3 days, THEN 1 tablet (20 mg total) daily with breakfast for 3 days.    Dispense:  18 tablet    Refill:  0   Orders:  Orders Placed This Encounter  Procedures  . Comp. Metabolic Panel (12)    With GFR. Indications: Chronic Pain Syndrome (G89.4) & Pharmacotherapy (L38.101)    Order Specific Question:   Has the patient fasted?    Answer:   Yes    Order Specific Question:   CC Results    Answer:   [id number] [[id number]]    Order Specific Question:   Release to patient    Answer:   Immediate  . CBC    Order Specific Question:   CC Results    Answer:   [id number] [[id number]]    Order Specific Question:   Release to patient    Answer:   Immediate  . Lipid panel    Order Specific Question:   Has the patient fasted?    Answer:   Yes   Follow-up plan:   Return in about 3 months (around 09/01/2020) for Medication Management, in person.   Recent Visits Date Type Provider Dept  03/08/20 Telemedicine Gillis Santa, MD [facility]  Showing recent visits within past 90 days and meeting all other requirements Today's Visits Date Type Provider Dept  06/01/20  Office Visit Gillis Santa, MD [facility]  Showing today's visits and meeting all other requirements Future Appointments No visits were found meeting these conditions. Showing future appointments within next 90 days and meeting all other requirements  I discussed the assessment and treatment plan with the patient. The patient was provided an opportunity to ask questions and all were answered. The patient agreed with the plan and demonstrated an understanding of the instructions.  Patient advised to call back or seek an in-person evaluation if the symptoms or condition worsens.  Duration of encounter:30 minutes.  Note by: Gillis Santa, MD Date: 06/01/2020; Time: 12:04 PM

## 2020-06-02 LAB — COMP. METABOLIC PANEL (12)
AST: 15 IU/L (ref 0–40)
Albumin/Globulin Ratio: 1.3 (ref 1.2–2.2)
Albumin: 4.6 g/dL (ref 4.0–5.0)
Alkaline Phosphatase: 103 IU/L (ref 48–121)
BUN/Creatinine Ratio: 15 (ref 9–20)
BUN: 47 mg/dL — ABNORMAL HIGH (ref 6–24)
Bilirubin Total: 0.3 mg/dL (ref 0.0–1.2)
Calcium: 10.1 mg/dL (ref 8.7–10.2)
Chloride: 101 mmol/L (ref 96–106)
Creatinine, Ser: 3.06 mg/dL — ABNORMAL HIGH (ref 0.76–1.27)
GFR calc Af Amer: 27 mL/min/{1.73_m2} — ABNORMAL LOW (ref >59)
GFR calc non Af Amer: 23 mL/min/{1.73_m2} — ABNORMAL LOW (ref >59)
Globulin, Total: 3.5 g/dL (ref 1.5–4.5)
Glucose: 111 mg/dL — ABNORMAL HIGH (ref 65–99)
Potassium: 5.7 mmol/L — ABNORMAL HIGH (ref 3.5–5.2)
Sodium: 138 mmol/L (ref 134–144)
Total Protein: 8.1 g/dL (ref 6.0–8.5)

## 2020-06-02 LAB — CBC
Hematocrit: 34.4 % — ABNORMAL LOW (ref 37.5–51.0)
Hemoglobin: 11.1 g/dL — ABNORMAL LOW (ref 13.0–17.7)
MCH: 27.8 pg (ref 26.6–33.0)
MCHC: 32.3 g/dL (ref 31.5–35.7)
MCV: 86 fL (ref 79–97)
Platelets: 369 10*3/uL (ref 150–450)
RBC: 4 x10E6/uL — ABNORMAL LOW (ref 4.14–5.80)
RDW: 14.1 % (ref 11.6–15.4)
WBC: 10.3 10*3/uL (ref 3.4–10.8)

## 2020-09-02 ENCOUNTER — Encounter: Payer: Self-pay | Admitting: Student in an Organized Health Care Education/Training Program

## 2020-09-02 ENCOUNTER — Ambulatory Visit
Payer: Medicare Other | Attending: Student in an Organized Health Care Education/Training Program | Admitting: Student in an Organized Health Care Education/Training Program

## 2020-09-02 ENCOUNTER — Other Ambulatory Visit: Payer: Self-pay | Admitting: Student in an Organized Health Care Education/Training Program

## 2020-09-02 ENCOUNTER — Other Ambulatory Visit: Payer: Self-pay

## 2020-09-02 VITALS — BP 145/93 | HR 95 | Temp 99.7°F | Resp 18 | Ht 69.0 in | Wt 252.0 lb

## 2020-09-02 DIAGNOSIS — M25531 Pain in right wrist: Secondary | ICD-10-CM | POA: Insufficient documentation

## 2020-09-02 DIAGNOSIS — M1A39X1 Chronic gout due to renal impairment, multiple sites, with tophus (tophi): Secondary | ICD-10-CM

## 2020-09-02 DIAGNOSIS — M170 Bilateral primary osteoarthritis of knee: Secondary | ICD-10-CM | POA: Insufficient documentation

## 2020-09-02 DIAGNOSIS — G894 Chronic pain syndrome: Secondary | ICD-10-CM | POA: Diagnosis present

## 2020-09-02 DIAGNOSIS — G5603 Carpal tunnel syndrome, bilateral upper limbs: Secondary | ICD-10-CM | POA: Diagnosis present

## 2020-09-02 DIAGNOSIS — Z79899 Other long term (current) drug therapy: Secondary | ICD-10-CM

## 2020-09-02 DIAGNOSIS — Q615 Medullary cystic kidney: Secondary | ICD-10-CM | POA: Insufficient documentation

## 2020-09-02 DIAGNOSIS — M79641 Pain in right hand: Secondary | ICD-10-CM

## 2020-09-02 MED ORDER — OXYCODONE-ACETAMINOPHEN 10-325 MG PO TABS: 1.0000 | ORAL_TABLET | Freq: Three times a day (TID) | ORAL | 0 refills | Status: DC | PRN

## 2020-09-02 MED ORDER — MORPHINE SULFATE ER 15 MG PO TBCR: 15.0000 mg | EXTENDED_RELEASE_TABLET | Freq: Every day | ORAL | 0 refills | Status: DC

## 2020-09-02 NOTE — Progress Notes (Signed)
Safety precautions to be maintained throughout the outpatient stay will include: orient to surroundings, keep bed in low position, maintain call bell within reach at all times, provide assistance with transfer out of bed and ambulation.   Nursing Pain Medication Assessment:  Safety precautions to be maintained throughout the outpatient stay will include: orient to surroundings, keep bed in low position, maintain call bell within reach at all times, provide assistance with transfer out of bed and ambulation.  Medication Inspection Compliance: Pill count conducted under aseptic conditions, in front of the patient. Neither the pills nor the bottle was removed from the patient's sight at any time. Once count was completed pills were immediately returned to the patient in their original bottle.  Medication #1: Oxycodone/APAP Pill/Patch Count: 29 of 90 pills remain Pill/Patch Appearance: Markings consistent with prescribed medication Bottle Appearance: Standard pharmacy container. Clearly labeled. Filled Date: 10 / 1 / 2021 Last Medication intake:  Today  Medication #2: Morphine ER (MSContin) Pill/Patch Count: 9 of 30 pills remain Pill/Patch Appearance: Markings consistent with prescribed medication Bottle Appearance: Standard pharmacy container. Clearly labeled. Filled Date: 10 / 01 / 2021 Last Medication intake:  Today

## 2020-09-02 NOTE — Patient Instructions (Signed)
____________________________________________________________________________________________  Medication Rules  Applies to: All patients receiving prescriptions (written or electronic).  Pharmacy of record: Pharmacy where electronic prescriptions will be sent. If written prescriptions are taken to a different pharmacy, please inform the nursing staff. The pharmacy listed in the electronic medical record should be the one where you would like electronic prescriptions to be sent.  Prescription refills: Only during scheduled appointments. Applies to both, written and electronic prescriptions.  NOTE: The following applies primarily to controlled substances (Opioid* Pain Medications).   Patient's responsibilities: 1. Pain Pills: Bring all pain pills to every appointment (except for procedure appointments). 2. Pill Bottles: Bring pills in original pharmacy bottle. Always bring newest bottle. Bring bottle, even if empty. 3. Medication refills: You are responsible for knowing and keeping track of what medications you need refilled. The day before your appointment, write a list of all prescriptions that need to be refilled. Bring that list to your appointment and give it to the admitting nurse. Prescriptions will be written only during appointments. If you forget a medication, it will not be "Called in", "Faxed", or "electronically sent". You will need to get another appointment to get these prescribed. 4. Prescription Accuracy: You are responsible for carefully inspecting your prescriptions before leaving our office. Have the discharge nurse carefully go over each prescription with you, before taking them home. Make sure that your name is accurately spelled, that your address is correct. Check the name and dose of your medication to make sure it is accurate. Check the number of pills, and the written instructions to make sure they are clear and accurate. Make sure that you are given enough medication to last  until your next medication refill appointment. 5. Taking Medication: Take medication as prescribed. Never take more pills than instructed. Never take medication more frequently than prescribed. Taking less pills or less frequently is permitted and encouraged, when it comes to controlled substances (written prescriptions).  6. Inform other Doctors: Always inform, all of your healthcare providers, of all the medications you take. 7. Pain Medication from other Providers: You are not allowed to accept any additional pain medication from any other Doctor or Healthcare provider. There are two exceptions to this rule. (see below) In the event that you require additional pain medication, you are responsible for notifying us, as stated below. 8. Medication Agreement: You are responsible for carefully reading and following our Medication Agreement. This must be signed before receiving any prescriptions from our practice. Safely store a copy of your signed Agreement. Violations to the Agreement will result in no further prescriptions. (Additional copies of our Medication Agreement are available upon request.) 9. Laws, Rules, & Regulations: All patients are expected to follow all Federal and State Laws, Statutes, Rules, & Regulations. Ignorance of the Laws does not constitute a valid excuse. The use of any illegal substances is prohibited. 10. Adopted CDC guidelines & recommendations: Target dosing levels will be at or below 60 MME/day. Use of benzodiazepines** is not recommended.  Exceptions: There are only two exceptions to the rule of not receiving pain medications from other Healthcare Providers. 1. Exception #1 (Emergencies): In the event of an emergency (i.e.: accident requiring emergency care), you are allowed to receive additional pain medication. However, you are responsible for: As soon as you are able, call our office [phone], at any time of the day or night, and leave a message stating your name, the  date and nature of the emergency, and the name and dose of the medication   prescribed. In the event that your call is answered by a member of our staff, make sure to document and save the date, time, and the name of the person that took your information.  2. Exception #2 (Planned Surgery): In the event that you are scheduled by another doctor or dentist to have any type of surgery or procedure, you are allowed (for a period no longer than 30 days), to receive additional pain medication, for the acute post-op pain. However, in this case, you are responsible for picking up a copy of our "Post-op Pain Management for Surgeons" handout, and giving it to your surgeon or dentist. This document is available at our office, and does not require an appointment to obtain it. Simply go to our office during business hours (Monday-Thursday from 8:00 AM to 4:00 PM) (Friday 8:00 AM to 12:00 Noon) or if you have a scheduled appointment with us, prior to your surgery, and ask for it by name. In addition, you will need to provide us with your name, name of your surgeon, type of surgery, and date of procedure or surgery.  *Opioid medications include: morphine, codeine, oxycodone, oxymorphone, hydrocodone, hydromorphone, meperidine, tramadol, tapentadol, buprenorphine, fentanyl, methadone. **Benzodiazepine medications include: diazepam (Valium), alprazolam (Xanax), clonazepam (Klonopine), lorazepam (Ativan), clorazepate (Tranxene), chlordiazepoxide (Librium), estazolam (Prosom), oxazepam (Serax), temazepam (Restoril), triazolam (Halcion) (Last updated: 01/10/2018) ____________________________________________________________________________________________    

## 2020-09-02 NOTE — Progress Notes (Signed)
PROVIDER NOTE: Information contained herein reflects review and annotations entered in association with encounter. Interpretation of such information and data should be left to medically-trained personnel. Information provided to patient can be located elsewhere in the medical record under "Patient Instructions". Document created using STT-dictation technology, any transcriptional errors that may result from process are unintentional.    Patient: Nathan Ramos  Service Category: E/M  Provider: Gillis Santa, MD  DOB: Mar 19, 1971  DOS: 09/02/2020  Specialty: Interventional Pain Management  MRN: [id number]  Setting: Ambulatory outpatient  PCP: [facility]  Type: Established Patient    Referring Provider: [facility]  Location: Office  Delivery: Face-to-face     HPI  [name], a 49 y.o. year old male, is here today because of his Chronic pain syndrome [G89.4]. [name] primary complain today is Medication Refill Last encounter: My last encounter with him was on 06/01/2020. Pertinent problems: [name] has Medullary cystic disease of the kidney; Chronic pain; Gout due to renal impairment; Primary localized osteoarthritis of right knee; Chronic pain syndrome; and Opioid use on their pertinent problem list. Pain Assessment: Severity of Chronic pain is reported as a 7 /10. Location: Hand Right/Radiates from right hand to upper arm to right side of neck.. Onset: More than a month ago. Quality: Numbness, Tender. Timing: Intermittent. Modifying factor(s): Medications, otheriswe nothing else is helping. Vitals:  height is 5' 9"  (1.753 m) and weight is 252 lb (114.3 kg). His oral temperature is 99.7 F (37.6 C). His blood pressure is 145/93 (abnormal) and his pulse is 95. His respiration is 18 and oxygen saturation is 100%.   Reason for encounter: medication management.   Marzetta Board presents today for medication management.  He endorses severe right hand pain and swelling.   His hand is in a brace.  He states that he has difficulty moving his pain and the swelling is getting worse. He continues to take his Percocet and morphine as prescribed.  Has always been compliant with therapy and urine toxicology results have always been appropriate.  We will refill as below.  We will repeat urine toxicology screen today.  Pharmacotherapy Assessment   Analgesic:  Percocet 10 mg 3 times daily as needed, quantity 90/month, MME 45, morphine 15 mg daily, MME: 15   Monitoring: [place] PMP: PDMP reviewed during this encounter.       Pharmacotherapy: No side-effects or adverse reactions reported. Compliance: No problems identified. Effectiveness: Clinically acceptable.  [name], RN  09/02/2020 10:04 AM  Sign when Signing Visit Safety precautions to be maintained throughout the outpatient stay will include: orient to surroundings, keep bed in low position, maintain call bell within reach at all times, provide assistance with transfer out of bed and ambulation.   Nursing Pain Medication Assessment:  Safety precautions to be maintained throughout the outpatient stay will include: orient to surroundings, keep bed in low position, maintain call bell within reach at all times, provide assistance with transfer out of bed and ambulation.  Medication Inspection Compliance: Pill count conducted under aseptic conditions, in front of the patient. Neither the pills nor the bottle was removed from the patient's sight at any time. Once count was completed pills were immediately returned to the patient in their original bottle.  Medication #1: Oxycodone/APAP Pill/Patch Count: 29 of 90 pills remain Pill/Patch Appearance: Markings consistent with prescribed medication Bottle Appearance: Standard pharmacy container. Clearly labeled. Filled Date: 10 / 1 / 2021 Last Medication intake:  Today  Medication #  2: Morphine ER (MSContin) Pill/Patch Count: 9 of 30 pills remain Pill/Patch Appearance:  Markings consistent with prescribed medication Bottle Appearance: Standard pharmacy container. Clearly labeled. Filled Date: 39 / 01 / 2021 Last Medication intake:  Today    UDS:  Summary  Date Value Ref Range Status  09/10/2019 Note  Final    Comment:    ==================================================================== Tox[employer] Select 13 (MW) ==================================================================== Test                             Result       Flag       Units Drug Present   Morphine                       4161                    ng/mg creat   Normorphine                    125                     ng/mg creat    Potential sources of large amounts of morphine in the absence of    codeine include administration of morphine or use of heroin.    Normorphine is an expected metabolite of morphine.   Oxycodone                      889                     ng/mg creat   Oxymorphone                    3386                    ng/mg creat   Noroxycodone                   672                     ng/mg creat   Noroxymorphone                 523                     ng/mg creat    Sources of oxycodone are scheduled prescription medications.    Oxymorphone, noroxycodone, and noroxymorphone are expected    metabolites of oxycodone. Oxymorphone is also available as a    scheduled prescription medication. ==================================================================== Test                      Result    Flag   Units      Ref Range   Creatinine              57               mg/dL      >=20 ==================================================================== Declared Medications:  Medication list was not provided. ==================================================================== For clinical consultation, please call [phone]. ====================================================================      ROS  Constitutional: Denies any fever or chills Gastrointestinal: No  reported hemesis, hematochezia, vomiting, or acute GI distress Musculoskeletal: Severe right hand pain Neurological: No reported episodes of acute onset apraxia, aphasia, dysarthria, agnosia, amnesia, paralysis, loss of coordination, or loss of consciousness  Medication Review  Febuxostat, amLODipine, atorvastatin, colchicine, furosemide, losartan, morphine, naloxone, oxyCODONE-acetaminophen, and predniSONE  History Review  Allergy: Mr. Rothlisberger is allergic to nsaids. Drug: Mr. Barris  reports no history of drug use. Alcohol:  reports no history of alcohol use. Tobacco:  reports that he has been smoking cigarettes. He has a 11.00 pack-year smoking history. He has never used smokeless tobacco. Social: Mr. Banh  reports that he has been smoking cigarettes. He has a 11.00 pack-year smoking history. He has never used smokeless tobacco. He reports that he does not drink alcohol and does not use drugs. Medical:  has a past medical history of Anemia, Bone spur of other site, Chronic pain, CKD (chronic kidney disease), Controlled substance agreement signed, Gout, Gouty arthritis, History of YAG laser capsulotomy of lens of left eye, Hyperlipidemia, Hypertension, IFG (impaired fasting glucose), Left knee DJD, Medullary cystic disease of the kidney, Primary localized osteoarthritis of right knee, and Smoker. Surgical: Mr. 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 Family: family history includes COPD in his maternal grandmother and mother; Kidney disease in his mother; Stroke (age of onset: 74) in his father; Thyroid disease in his mother.  Laboratory Chemistry Profile    Renal Lab Results  Component Value Date   BUN 47 (H) 06/01/2020   CREATININE 3.06 (H) 06/01/2020   BCR 15 06/01/2020   GFRAA 27 (L) 06/01/2020   GFRNONAA 23 (L) 06/01/2020     Hepatic Lab Results  Component Value Date   AST 15 06/01/2020   ALT 14 10/11/2019   ALBUMIN 4.6 06/01/2020   ALKPHOS 103 06/01/2020     Electrolytes Lab Results  Component Value Date   NA 138 06/01/2020   K 5.7 (H) 06/01/2020   CL 101 06/01/2020   CALCIUM 10.1 06/01/2020     Bone No results found for: VD25OH, VD125OH2TOT, VZ5638VF6, EP3295JO8, 25OHVITD1, 25OHVITD2, 25OHVITD3, TESTOFREE, TESTOSTERONE   Inflammation (CRP: Acute Phase) (ESR: Chronic Phase) No results found for: CRP, ESRSEDRATE, LATICACIDVEN     Note: Above Lab results reviewed.  Recent Imaging Review  DG Foot Complete Left CLINICAL DATA:  Foot swelling, redness, stepped on pencil yesterday with puncture wound between the fourth and fifth toes  EXAM: LEFT FOOT - COMPLETE 3+ VIEW  COMPARISON:  None.  FINDINGS: No acute fracture or traumatic malalignment. There is diffuse swelling of the forefoot most pronounced near the fourth and fifth metatarsophalangeal joints, correlate with site of puncture wound. No soft tissue gas or foreign body is seen. Lateral angulation across the second distal interphalangeal joint may be remote posttraumatic or developmental. 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 Midfoot and hindfoot alignment is grossly preserved though incompletely assessed on nonweightbearing films. Corticated os trigonum posteriorly.  IMPRESSION: 1. No soft tissue gas or foreign body. 2. Diffuse forefoot soft tissue swelling, most pronounced near the fourth and fifth metatarsophalangeal joints. Correlate with site of puncture wound. 3. 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 Alignment grossly preserved though incompletely assessed on nonweightbearing radiograph.  Electronically Signed   By: Lovena Le M.D.   On: 10/07/2019 22:43 Note: Reviewed  Physical Exam  General appearance: Well nourished, well developed, and well hydrated. In no apparent acute distress Mental status: Alert, oriented x 3 (person, place, & time)       Respiratory: No evidence of acute respiratory distress Eyes: PERLA Vitals: BP (!) 145/93   Pulse 95   Temp 99.7 F (37.6 C) (Oral)   Resp 18   Ht 5' 9"  (1.753 m)   Wt 252 lb (114.3 kg)   SpO2 100%   BMI 37.21 kg/m  BMI: Estimated body mass index is 37.21 kg/m as calculated from the following:   Height as of this encounter: 5' 9"  (1.753 m).   Weight as of this encounter: 252 lb (114.3 kg). Ideal: Ideal body weight: 70.7 kg (155 lb 13.8 oz) Adjusted ideal body weight: 88.1 kg (194 lb 5.1 oz)   Upper Extremity (UE) Exam    Side: Right upper extremity  Side: Left upper extremity  Skin & Extremity Inspection:  Right hand in brace edema present pronounced in ring and middle finger  Skin & Extremity Inspection: Skin color, temperature, and hair growth are WNL. No peripheral edema or cyanosis. No masses, redness, swelling, asymmetry, or associated skin lesions. No contractures.  Functional ROM:  Pain restricted range of motion      Functional ROM: Unrestricted ROM          Muscle Tone/Strength: Functionally intact. No obvious neuro-muscular anomalies detected.  Muscle Tone/Strength: Functionally intact. No obvious neuro-muscular anomalies detected.  Sensory (Neurological):  Myogenic/musculoskeletal  Sensory (Neurological): Unimpaired          Palpation: No palpable anomalies              Palpation: No palpable anomalies              Provocative Test(s):  Phalen's test: deferred Tinel's test: deferred Apley's scratch test (touch opposite shoulder):  Action 1 (Across chest): deferred Action 2  (Overhead): deferred Action 3 (LB reach): deferred   Provocative Test(s):  Phalen's test: deferred Tinel's test: deferred Apley's scratch test (touch opposite shoulder):  Action 1 (Across chest): deferred Action 2 (Overhead): deferred Action 3 (LB reach): deferred    Thoracic Spine Area Exam  Skin & Axial Inspection: No masses, redness, or swelling Alignment: Symmetrical Functional ROM: Unrestricted ROM Stability: No instability detected Muscle Tone/Strength: Functionally intact. No obvious neuro-muscular anomalies detected. Sensory (Neurological): Musculoskeletal pain pattern Muscle strength & Tone: No palpable anomalies   Lumbar Spine Area Exam  Skin & Axial Inspection: No masses, redness, or swelling Alignment: Symmetrical Functional ROM: Unrestricted ROM       Stability: No instability detected Muscle Tone/Strength: Functionally intact. No obvious neuro-muscular anomalies detected. Sensory (Neurological): Musculoskeletal pain pattern  Lower Extremity Exam    Side: Right lower extremity  Side: Left lower extremity  Stability: No instability observed          Stability: No instability observed          Skin & Extremity Inspection: Skin color, temperature, and hair growth are WNL. No peripheral edema or cyanosis. No masses, redness, swelling, asymmetry, or associated skin lesions. No contractures.  Skin & Extremity Inspection: Skin color, temperature, and hair growth are WNL. No peripheral edema or cyanosis. No masses, redness, swelling, asymmetry, or associated skin lesions. No contractures.  Functional ROM: Unrestricted ROM                  Functional ROM: Unrestricted ROM  Muscle Tone/Strength: Functionally intact. No obvious neuro-muscular anomalies detected.  Muscle Tone/Strength: Functionally intact. No obvious neuro-muscular anomalies detected.  Sensory (Neurological): Musculoskeletal pain pattern        Sensory (Neurological): Musculoskeletal  pain pattern        DTR: Patellar: deferred today Achilles: deferred today Plantar: deferred today  DTR: Patellar: deferred today Achilles: deferred today Plantar: deferred today  Palpation: No palpable anomalies  Palpation: No palpable anomalies      Assessment   Status Diagnosis  Controlled Worsening Worsening 1. Chronic pain syndrome   2. Right hand pain   3. Right wrist pain   4. Bilateral carpal tunnel syndrome   5. Controlled substance agreement signed   6. 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 Medullary cystic disease of the kidney   8. Primary osteoarthritis of both knees       Plan of Care  Mr. EVER GUSTAFSON has a current medication list which includes the following long-term medication(s): amlodipine, atorvastatin, colchicine, furosemide, losartan, [START ON 09/12/2020] morphine, [START ON 10/12/2020] morphine, [START ON 11/11/2020] morphine, [START ON 09/12/2020] oxycodone-acetaminophen, [START ON 10/12/2020] oxycodone-acetaminophen, and [START ON 11/11/2020] oxycodone-acetaminophen.  Pharmacotherapy (Medications Ordered): Meds ordered this encounter  Medications  . oxyCODONE-acetaminophen (PERCOCET) 10-325 MG tablet    Sig: Take 1 tablet by mouth every 8 (eight) hours as needed for pain. Must last 30 days.    Dispense:  90 tablet    Refill:  0    [place] STOP ACT - Not applicable. Fill one day early if pharmacy is closed on scheduled refill date.  Marland Kitchen oxyCODONE-acetaminophen (PERCOCET) 10-325 MG tablet    Sig: Take 1 tablet by mouth every 8 (eight) hours as needed for pain. Must last 30 days.    Dispense:  90 tablet    Refill:  0    [place] STOP ACT - Not applicable. Fill one day early if pharmacy is closed on scheduled refill date.  Marland Kitchen oxyCODONE-acetaminophen (PERCOCET) 10-325 MG tablet    Sig: Take 1 tablet by mouth every 8 (eight) hours as needed for pain. Must last 30 days.    Dispense:  90 tablet    Refill:  0    [place] STOP ACT  - Not applicable. Fill one day early if pharmacy is closed on scheduled refill date.  . morphine (MS CONTIN) 15 MG 12 hr tablet    Sig: Take 1 tablet (15 mg total) by mouth daily. Must last 30 days.    Dispense:  30 tablet    Refill:  0    [place] STOP ACT - Not applicable. Fill one day early if pharmacy is closed on scheduled refill date.  . morphine (MS CONTIN) 15 MG 12 hr tablet    Sig: Take 1 tablet (15 mg total) by mouth daily. Must last 30 days.    Dispense:  30 tablet    Refill:  0    [place] STOP ACT - Not applicable. Fill one day early if pharmacy is closed on scheduled refill date.  . morphine (MS CONTIN) 15 MG 12 hr tablet    Sig: Take 1 tablet (15 mg total) by mouth daily. Must last 30 days.    Dispense:  30 tablet    Refill:  0    [place] STOP ACT - Not applicable. Fill one day early if pharmacy is closed on scheduled refill date.   Orders:  Orders Placed This Encounter  Procedures  . CT HAND  RIGHT WO CONTRAST    Standing Status:   Future    Standing Expiration Date:   03/03/2021    Order Specific Question:   Preferred imaging location?    Answer:   [facility]  . ToxASSURE Select 13 (MW), Urine    Volume: 30 ml(s). Minimum 3 ml of urine is needed. Document temperature of fresh sample. Indications: Long term (current) use of opiate analgesic [phone])    Order Specific Question:   Release to patient    Answer:   Immediate   Follow-up plan:   Return in about 3 months (around 12/03/2020) for Medication Management, in person.   Recent Visits No visits were found meeting these conditions. Showing recent visits within past 90 days and meeting all other requirements Today's Visits Date Type Provider Dept  09/02/20 Office Visit Gillis Santa, MD [facility]  Showing today's visits and meeting all other requirements Future Appointments Date Type Provider Dept  11/30/20 Appointment Gillis Santa, MD [facility]  Showing future appointments within next 90 days  and meeting all other requirements  I discussed the assessment and treatment plan with the patient. The patient was provided an opportunity to ask questions and all were answered. The patient agreed with the plan and demonstrated an understanding of the instructions.  Patient advised to call back or seek an in-person evaluation if the symptoms or condition worsens.  Duration of encounter: 30mnutes.  Note by: BGillis Santa MD Date: 09/02/2020; Time: 11:52 AM

## 2020-09-06 ENCOUNTER — Telehealth: Payer: Self-pay | Admitting: Student in an Organized Health Care Education/Training Program

## 2020-09-06 ENCOUNTER — Other Ambulatory Visit: Payer: Self-pay | Admitting: Student in an Organized Health Care Education/Training Program

## 2020-09-06 DIAGNOSIS — G5603 Carpal tunnel syndrome, bilateral upper limbs: Secondary | ICD-10-CM

## 2020-09-06 MED ORDER — PREDNISONE 20 MG PO TABS: ORAL_TABLET | ORAL | 0 refills | Status: AC

## 2020-09-06 NOTE — Telephone Encounter (Signed)
Patient states he is unable to come in for the wrist injections, he wants to know if Dr. Cherylann Ratel can call in a regimen of the prednisone and he can take it that way. Please call patient asap and let him know

## 2020-09-06 NOTE — Telephone Encounter (Signed)
Please advise 

## 2020-09-07 NOTE — Telephone Encounter (Signed)
Patient called and has already picked up script.

## 2020-09-10 LAB — TOXASSURE SELECT 13 (MW), URINE

## 2020-09-14 ENCOUNTER — Ambulatory Visit
Admission: RE | Admit: 2020-09-14 | Discharge: 2020-09-14 | Disposition: A | Discharge status: Home / Self Care | Payer: Medicare Other | Source: Ambulatory Visit | Attending: Student in an Organized Health Care Education/Training Program | Admitting: Student in an Organized Health Care Education/Training Program

## 2020-09-14 ENCOUNTER — Other Ambulatory Visit: Payer: Self-pay

## 2020-09-14 DIAGNOSIS — G894 Chronic pain syndrome: Secondary | ICD-10-CM

## 2020-09-14 DIAGNOSIS — M79641 Pain in right hand: Secondary | ICD-10-CM | POA: Insufficient documentation

## 2020-09-14 DIAGNOSIS — M25531 Pain in right wrist: Secondary | ICD-10-CM | POA: Insufficient documentation

## 2020-09-27 ENCOUNTER — Telehealth: Payer: Self-pay | Admitting: *Deleted

## 2020-09-27 NOTE — Telephone Encounter (Signed)
He had an MRI and wants to know the results. His appt is not until January. Please call

## 2020-09-27 NOTE — Telephone Encounter (Signed)
Called patient with his CT of the hand results. Instructed him (per Dr. Cherylann Ratel) that he needs to get in touch with his rheumatologist and have that MD see the CT results and see if they want to prescribe any MTX or immunologics. Patient understands instructions

## 2020-10-21 ENCOUNTER — Telehealth: Payer: Self-pay

## 2020-10-21 DIAGNOSIS — G894 Chronic pain syndrome: Secondary | ICD-10-CM

## 2020-10-21 NOTE — Telephone Encounter (Signed)
He wants to know if Dr. Cherylann Ratel will call in a prednisone script for him.

## 2020-10-21 NOTE — Telephone Encounter (Signed)
Dr Cherylann Ratel, this patient is a little upset because he was told to see his rheumatologist but he has never had a rheumatologist.  He called wanting a prednisone prescription sent in. Would you like to place a referral to a rheumatologist?

## 2020-10-25 ENCOUNTER — Telehealth: Payer: Self-pay

## 2020-10-25 MED ORDER — PREDNISONE 20 MG PO TABS: ORAL_TABLET | ORAL | 0 refills | Status: AC

## 2020-10-25 NOTE — Telephone Encounter (Signed)
I would strongly recommend that he establishes with a rheumatologist.  Please have his primary care provider send in referral.  I will place order for prednisone taper.  Requested Prescriptions   Signed Prescriptions Disp Refills  . predniSONE (DELTASONE) 20 MG tablet 18 tablet 0    Sig: Take 3 tablets (60 mg total) by mouth daily with breakfast for 3 days, THEN 2 tablets (40 mg total) daily with breakfast for 3 days, THEN 1 tablet (20 mg total) daily with breakfast for 3 days.    Authorizing Provider: [name]

## 2020-10-25 NOTE — Telephone Encounter (Signed)
Patient notified that a script was sent in for prednisone and to have her PCP send a referral to a rheumatologist.

## 2020-11-29 ENCOUNTER — Telehealth: Payer: Self-pay

## 2020-11-29 NOTE — Telephone Encounter (Signed)
Attempted to call to go over pre virtual appoinment questions.  Unable to leave message because voicemail box was full.

## 2020-11-30 ENCOUNTER — Encounter: Payer: Medicare Other | Admitting: Student in an Organized Health Care Education/Training Program

## 2020-11-30 ENCOUNTER — Ambulatory Visit
Payer: Medicare Other | Attending: Student in an Organized Health Care Education/Training Program | Admitting: Student in an Organized Health Care Education/Training Program

## 2020-11-30 ENCOUNTER — Other Ambulatory Visit: Payer: Self-pay

## 2020-11-30 DIAGNOSIS — G894 Chronic pain syndrome: Secondary | ICD-10-CM

## 2020-11-30 NOTE — Progress Notes (Signed)
Face-to-face visit for medication management prior to 12/13/20

## 2020-12-09 ENCOUNTER — Encounter: Payer: Self-pay | Admitting: Student in an Organized Health Care Education/Training Program

## 2020-12-09 ENCOUNTER — Ambulatory Visit
Payer: Medicare Other | Attending: Student in an Organized Health Care Education/Training Program | Admitting: Student in an Organized Health Care Education/Training Program

## 2020-12-09 ENCOUNTER — Other Ambulatory Visit: Payer: Self-pay

## 2020-12-09 VITALS — BP 160/90 | HR 106 | Temp 97.7°F | Resp 16 | Ht 69.5 in | Wt 250.0 lb

## 2020-12-09 DIAGNOSIS — G894 Chronic pain syndrome: Secondary | ICD-10-CM | POA: Diagnosis present

## 2020-12-09 DIAGNOSIS — M79641 Pain in right hand: Secondary | ICD-10-CM | POA: Insufficient documentation

## 2020-12-09 DIAGNOSIS — G5603 Carpal tunnel syndrome, bilateral upper limbs: Secondary | ICD-10-CM | POA: Diagnosis present

## 2020-12-09 DIAGNOSIS — Z79899 Other long term (current) drug therapy: Secondary | ICD-10-CM | POA: Insufficient documentation

## 2020-12-09 DIAGNOSIS — Q615 Medullary cystic kidney: Secondary | ICD-10-CM | POA: Insufficient documentation

## 2020-12-09 DIAGNOSIS — M1A39X1 Chronic gout due to renal impairment, multiple sites, with tophus (tophi): Secondary | ICD-10-CM | POA: Insufficient documentation

## 2020-12-09 DIAGNOSIS — M25531 Pain in right wrist: Secondary | ICD-10-CM | POA: Diagnosis present

## 2020-12-09 DIAGNOSIS — M170 Bilateral primary osteoarthritis of knee: Secondary | ICD-10-CM | POA: Diagnosis present

## 2020-12-09 MED ORDER — OXYCODONE-ACETAMINOPHEN 10-325 MG PO TABS: 1.0000 | ORAL_TABLET | Freq: Three times a day (TID) | ORAL | 0 refills | Status: DC | PRN

## 2020-12-09 MED ORDER — MORPHINE SULFATE ER 15 MG PO TBCR: 15.0000 mg | EXTENDED_RELEASE_TABLET | Freq: Every day | ORAL | 0 refills | Status: DC

## 2020-12-09 MED ORDER — PREDNISONE 20 MG PO TABS: ORAL_TABLET | ORAL | 0 refills | Status: AC

## 2020-12-09 NOTE — Progress Notes (Signed)
Nursing Pain Medication Assessment:  Safety precautions to be maintained throughout the outpatient stay will include: orient to surroundings, keep bed in low position, maintain call bell within reach at all times, provide assistance with transfer out of bed and ambulation.  Medication Inspection Compliance: Pill count conducted under aseptic conditions, in front of the patient. Neither the pills nor the bottle was removed from the patient's sight at any time. Once count was completed pills were immediately returned to the patient in their original bottle.  Medication #1: Morphine ER (MSContin) Pill/Patch Count: 2 of 30 pills remain Pill/Patch Appearance: Markings consistent with prescribed medication Bottle Appearance: Standard pharmacy container. Clearly labeled. Filled Date: 48 / 72 / 21 Last Medication intake:  Today  Medication #2: Oxycodone/APAP Pill/Patch Count: 8 of 90 pills remain Pill/Patch Appearance: Markings consistent with prescribed medication Bottle Appearance: Standard pharmacy container. Clearly labeled. Filled Date: 40 / 2 / 21 Last Medication intake:  Today

## 2020-12-09 NOTE — Progress Notes (Signed)
PROVIDER NOTE: Information contained herein reflects review and annotations entered in association with encounter. Interpretation of such information and data should be left to medically-trained personnel. Information provided to patient can be located elsewhere in the medical record under "Patient Instructions". Document created using STT-dictation technology, any transcriptional errors that may result from process are unintentional.    Patient: Nathan Ramos  Service Category: E/M  Provider: Gillis Santa, MD  DOB: 29-Jan-1971  DOS: 12/09/2020  Specialty: Interventional Pain Management  MRN: [id number]  Setting: Ambulatory outpatient  [facility]  Type: Established Patient    Referring Provider: [facility]  Location: Office  Delivery: Face-to-face     HPI  [name], a 50 y.o. year old male, is here today because of his Bilateral carpal tunnel syndrome [G56.03]. [name] primary complain today is Hand Pain (bilat) and Foot Pain (bilat) Last encounter: My last encounter with him was on 09/06/2020. Pertinent problems: [name] has Medullary cystic disease of the kidney; Chronic pain; Gout due to renal impairment; Primary localized osteoarthritis of right knee; Chronic pain syndrome; and Opioid use on their pertinent problem list. Pain Assessment: Severity of Chronic pain is reported as a 8 /10. Location: Foot Right,Left/pain at top of both feet. Onset: More than a month ago. Quality: Sharp,Stabbing,Sore. Timing: Constant. Modifying factor(s): rest, ice pack, meds. Vitals:  height is 5' 9.5" (1.765 m) and weight is 250 lb (113.4 kg). His temporal temperature is 97.7 F (36.5 C). His blood pressure is 160/90 (abnormal) and his pulse is 106 (abnormal). His respiration is 16 and oxygen saturation is 95%.   Reason for encounter: medication management.    Patient continues multimodal pain regimen as prescribed.  States that it provides pain relief and  improvement in functional status although was having increased bilateral foot pain secondary to a gout flare. His mother passed away end of 11-08-23 which has been difficult.   He has been limited in his ability to work given the stress and grief of dealing with his mother's loss. Pharmacotherapy Assessment   Analgesic: Percocet 10 mg 3 times daily as needed, quantity 90/month, MME 45, morphine 15 mg daily, MME: 15   Monitoring: [place] PMP: PDMP reviewed during this encounter.       Pharmacotherapy: No side-effects or adverse reactions reported. Compliance: No problems identified. Effectiveness: Clinically acceptable.  Nathan Ramos, Nathan Ramos  12/09/2020  8:53 AM  Sign when Signing Visit Nursing Pain Medication Assessment:  Safety precautions to be maintained throughout the outpatient stay will include: orient to surroundings, keep bed in low position, maintain call bell within reach at all times, provide assistance with transfer out of bed and ambulation.  Medication Inspection Compliance: Pill count conducted under aseptic conditions, in front of the patient. Neither the pills nor the bottle was removed from the patient's sight at any time. Once count was completed pills were immediately returned to the patient in their original bottle.  Medication #1: Morphine ER (MSContin) Pill/Patch Count: 2 of 30 pills remain Pill/Patch Appearance: Markings consistent with prescribed medication Bottle Appearance: Standard pharmacy container. Clearly labeled. Filled Date: 61 / 61 / 21 Last Medication intake:  Today  Medication #2: Oxycodone/APAP Pill/Patch Count: 8 of 90 pills remain Pill/Patch Appearance: Markings consistent with prescribed medication Bottle Appearance: Standard pharmacy container. Clearly labeled. Filled Date: 37 / 109 / 21 Last Medication intake:  Today    UDS:  Summary  Date Value Ref Range Status  09/02/2020 Note  Final  Comment:     ==================================================================== [employer] Select 13 (MW) ==================================================================== Test                             Result       Flag       Units  Drug Present and Declared for Prescription Verification   Morphine                       5191         EXPECTED   ng/mg creat   Normorphine                    83           EXPECTED   ng/mg creat    Potential sources of large amounts of morphine in the absence of    codeine include administration of morphine or use of heroin.     Normorphine is an expected metabolite of morphine.    Hydromorphone                  59           EXPECTED   ng/mg creat    Hydromorphone may be present as a metabolite of morphine;    concentrations of hydromorphone rarely exceed 5% of the morphine    concentration when this is the source of hydromorphone.    Oxycodone                      1294         EXPECTED   ng/mg creat   Oxymorphone                    2940         EXPECTED   ng/mg creat   Noroxycodone                   966          EXPECTED   ng/mg creat   Noroxymorphone                 412          EXPECTED   ng/mg creat    Sources of oxycodone are scheduled prescription medications.    Oxymorphone, noroxycodone, and noroxymorphone are expected    metabolites of oxycodone. Oxymorphone is also available as a    scheduled prescription medication.  ==================================================================== Test                      Result    Flag   Units      Ref Range   Creatinine              116              mg/dL      >=20 ==================================================================== Declared Medications:  The flagging and interpretation on this report are based on the  following declared medications.  Unexpected results may arise from  inaccuracies in the declared medications.   **Note: The testing scope of this panel includes these medications:   Morphine  (MS Contin)  Oxycodone (Percocet)   **Note: The testing scope of this panel does not include the  following reported medications:   Acetaminophen (Percocet)  Amlodipine  Atorvastatin  Colchicine  Febuxostat  Furosemide  Losartan  Naloxone (Narcan)  Prednisone ==================================================================== For clinical  consultation, please call [phone]. ====================================================================      ROS  Constitutional: Denies any fever or chills Gastrointestinal: No reported hemesis, hematochezia, vomiting, or acute GI distress Musculoskeletal: Joint swelling, bilateral foot pain Neurological: No reported episodes of acute onset apraxia, aphasia, dysarthria, agnosia, amnesia, paralysis, loss of coordination, or loss of consciousness  Medication Review  Febuxostat, amLODipine, atorvastatin, colchicine, furosemide, losartan, morphine, naloxone, oxyCODONE-acetaminophen, and predniSONE  History Review  Allergy: [name] is allergic to nsaids. Drug: [name]  reports no history of drug use. Alcohol:  reports no history of alcohol use. Tobacco:  reports that he has been smoking cigarettes. He has a 11.00 pack-year smoking history. He has never used smokeless tobacco. Social: [name]  reports that he has been smoking cigarettes. He has a 11.00 pack-year smoking history. He has never used smokeless tobacco. He reports that he does not drink alcohol and does not use drugs. Medical:  has a past medical history of Anemia, Bone spur of other site, Chronic pain, CKD (chronic kidney disease), Controlled substance agreement signed, Gout, Gouty arthritis, History of YAG laser capsulotomy of lens of left eye, Hyperlipidemia, Hypertension, IFG (impaired fasting glucose), Left knee DJD, Medullary cystic disease of the kidney, Primary localized osteoarthritis of right knee, and Smoker. Surgical: 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 Family: family history includes COPD in his maternal grandmother and mother; Kidney disease in his mother; Stroke (age of onset: 48) in his father; Thyroid disease in his mother.  Laboratory Chemistry Profile   Renal Lab Results  Component Value Date   BUN 47 (H) 06/01/2020   CREATININE 3.06 (H) 06/01/2020   BCR 15 06/01/2020   GFRAA 27 (L) 06/01/2020   GFRNONAA 23 (L) 06/01/2020     Hepatic Lab Results  Component Value Date   AST 15 06/01/2020   ALT 14 10/11/2019   ALBUMIN 4.6 06/01/2020   ALKPHOS 103 06/01/2020     Electrolytes Lab Results  Component Value Date   NA 138 06/01/2020   K 5.7 (H) 06/01/2020   CL 101 06/01/2020   CALCIUM 10.1 06/01/2020     Bone No results found for: VD25OH, VD125OH2TOT, [id number], MB8675QG9, 25OHVITD1, 25OHVITD2, 25OHVITD3, TESTOFREE, TESTOSTERONE   Inflammation (CRP: Acute Phase) (ESR: Chronic Phase) No results found for: CRP, ESRSEDRATE, LATICACIDVEN     Note: Above Lab results reviewed.  Recent Imaging Review  CT HAND RIGHT WO CONTRAST CLINICAL DATA:  Right hand pain, swelling and limited range of motion.  EXAM: CT OF THE RIGHT HAND WITHOUT CONTRAST  TECHNIQUE: Multidetector CT imaging of the right hand was performed according to the standard protocol. Multiplanar CT image reconstructions were also generated.  COMPARISON:  Radiographs 07/03/2018  FINDINGS: Significant age advanced degenerative changes involving the hands and wrists. Moderate interphalangeal joint space narrowing and early spurring.  The MCP joints demonstrate scattered erosions and soft tissue calcifications.  There is a sizable erosion in the second metacarpal head away from the joint. There is also significant calcific tendinitis involving the flexor tendons of the fingers.  Advanced degenerative changes at the wrist with carpal crowding and erosive changes. Chondrocalcinosis is also noted and there is calcification in the extensor tendons and flexor tendons at the wrist.  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  No acute bony findings or destructive bony changes.  IMPRESSION: 1. Significant age advanced degenerative changes involving the hands and wrists as described above. 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 2. No acute bony findings or destructive bony changes. 3. Significant and fairly diffuse calcific tendinitis.  Electronically Signed   By: Marijo Sanes M.D.   On: 09/14/2020 13:54 Note: Reviewed        Physical Exam  General appearance: Well nourished, well developed, and well hydrated. In no apparent acute distress Mental status: Alert, oriented x 3 (person, place, & time)       Respiratory: No evidence of acute respiratory distress Eyes: PERLA Vitals: BP (!) 160/90 (BP Location: Right Arm, Patient Position: Sitting, Cuff Size: Large)   Pulse (!) 106   Temp 97.7 F (36.5 C) (Temporal)   Resp 16   Ht 5' 9.5" (1.765 m)   Wt 250 lb (113.4 kg)   SpO2 95%   BMI 36.39 kg/m  BMI: Estimated body mass index is 36.39 kg/m as calculated from the following:   Height as of this encounter: 5' 9.5" (1.765 m).   Weight as of this encounter: 250 lb (113.4 kg). Ideal: Ideal body weight: 71.8 kg (158 lb 6.4 oz) Adjusted ideal body weight: 88.5 kg (195 lb 0.6 oz)    Upper Extremity (UE) Exam    Side:Right upper extremity  Side:Left upper extremity  Skin & Extremity Inspection:  Edema of bilateral hands  Skin & Extremity Inspection:Edema  Functional ROM: Pain restricted range of motion  Functional IRC:VELF restricted range of motion of  wrist joint  Muscle Tone/Strength:Functionally intact. No obvious neuro-muscular anomalies detected.  Muscle Tone/Strength:Functionally intact. No obvious neuro-muscular anomalies detected.  Sensory (Neurological): Myogenic/musculoskeletal  Sensory (Neurological):Musculoskeletal     Lumbar Spine Area Exam  Skin & Axial Inspection:No masses, redness, or swelling Alignment:Symmetrical Functional YBO:FBPZWCHENIDP ROM Stability:No instability detected Muscle Tone/Strength:Functionally intact. No obvious neuro-muscular anomalies detected. Sensory (Neurological):Musculoskeletal pain pattern  Lower Extremity Exam    Side:Right lower extremity  Side:Left lower extremity  Stability:No instability observed  Stability:No instability observed  Skin & Extremity Inspection:Skin color, temperature, and hair growth are WNL. No peripheral edema or cyanosis. No masses, redness, swelling, asymmetry, or associated skin lesions. No contractures.  Skin & Extremity Inspection:Skin color, temperature, and hair growth are WNL. No peripheral edema or cyanosis. No masses, redness, swelling, asymmetry, or associated skin lesions. No contractures.  Functional OEU:[id number] ROM   Functional MGQ:[id number] ROM   Muscle Tone/Strength:Functionally intact. No obvious neuro-muscular anomalies detected.  Muscle Tone/Strength:Functionally intact. No obvious neuro-muscular anomalies detected.  Sensory (Neurological):Musculoskeletal pain pattern  Sensory (Neurological):Musculoskeletal pain pattern    Assessment   Status Diagnosis  Controlled Controlled Controlled 1. Bilateral carpal tunnel syndrome   2. Chronic pain syndrome   3. Right hand pain   4. Right wrist pain   5. Controlled substance agreement signed   6. 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 Medullary cystic disease  of the kidney   8. Primary osteoarthritis of both knees       Plan of Care  [name] has a current medication list which includes the following long-term medication(s): amlodipine, atorvastatin, colchicine, furosemide, losartan, [START ON 12/12/2020] morphine, [START ON 01/11/2021] morphine, [START ON 02/10/2021] morphine, [START ON 12/12/2020] oxycodone-acetaminophen, [START ON 01/11/2021] oxycodone-acetaminophen, and [START ON 02/10/2021] oxycodone-acetaminophen.  Pharmacotherapy (Medications Ordered): Meds ordered this encounter  Medications  . oxyCODONE-acetaminophen (PERCOCET) 10-325 MG tablet    Sig: Take 1 tablet by mouth every 8 (eight) hours as needed for pain. Must last 30 days.    Dispense:  90 tablet    Refill:  0    [place] STOP ACT - Not applicable. Fill one day early if pharmacy is closed on scheduled refill date.  Nathan Ramos Kitchen oxyCODONE-acetaminophen (PERCOCET) 10-325 MG tablet    Sig: Take 1 tablet by mouth every 8 (eight) hours as needed for pain. Must last 30 days.    Dispense:  90 tablet    Refill:  0    [place] STOP ACT - Not applicable. Fill one day early if pharmacy is closed on scheduled refill date.  Nathan Ramos Kitchen oxyCODONE-acetaminophen (PERCOCET) 10-325 MG tablet    Sig: Take 1 tablet by mouth every 8 (eight) hours as needed for pain. Must last 30 days.    Dispense:  90 tablet    Refill:  0    [place] STOP ACT - Not applicable. Fill one day early if pharmacy is closed on scheduled refill date.  . morphine (MS CONTIN) 15 MG 12 hr tablet    Sig: Take 1 tablet (15 mg total) by mouth daily. Must last 30 days.    Dispense:  30 tablet    Refill:  0    [place] STOP ACT - Not applicable. Fill one day early if pharmacy is closed on scheduled refill date.  . morphine (MS CONTIN) 15 MG 12 hr tablet    Sig: Take 1 tablet (15 mg total) by mouth daily. Must last 30 days.    Dispense:  30 tablet    Refill:  0    [place] STOP ACT - Not applicable. Fill one day early if pharmacy is closed on scheduled refill date.   . morphine (MS CONTIN) 15 MG 12 hr tablet    Sig: Take 1 tablet (15 mg total) by mouth daily. Must last 30 days.    Dispense:  30 tablet    Refill:  0     STOP ACT - Not applicable. Fill one day early if pharmacy is closed on scheduled refill date.  . predniSONE (DELTASONE) 20 MG tablet    Sig: Take 3 tablets (60 mg total) by mouth daily with breakfast for 3 days, THEN 2 tablets (40 mg total) daily with breakfast for 3 days, THEN 1 tablet (20 mg total) daily with breakfast for 3 days.    Dispense:  18 tablet    Refill:  0    Follow-up plan:   Return in about 3 months (around 03/09/2021) for Medication Management, in person.   Recent Visits Date Type Provider Dept  11/30/20 Telemedicine Gillis Santa, MD [facility]  Showing recent visits within past 90 days and meeting all other requirements Today's Visits Date Type Provider Dept  12/09/20 Office Visit Gillis Santa, MD [facility]  Showing today's visits and meeting all other requirements Future Appointments No visits were found meeting these conditions. Showing future appointments within next 90 days and meeting all other requirements  I discussed the assessment and treatment plan with the patient. The patient was provided an opportunity to ask questions and all were answered. The patient agreed with the plan and demonstrated an understanding of the instructions.  Patient advised to call back or seek an in-person evaluation if the symptoms or condition worsens.  Duration of encounter: 30 minutes.  Note by: Gillis Santa, MD Date: 12/09/2020; Time: 9:08 AM

## 2021-03-08 ENCOUNTER — Encounter: Payer: Self-pay | Admitting: Student in an Organized Health Care Education/Training Program

## 2021-03-08 ENCOUNTER — Ambulatory Visit
Payer: Medicare Other | Attending: Student in an Organized Health Care Education/Training Program | Admitting: Student in an Organized Health Care Education/Training Program

## 2021-03-08 ENCOUNTER — Other Ambulatory Visit: Payer: Self-pay

## 2021-03-08 VITALS — BP 155/92 | HR 95 | Temp 99.0°F | Resp 16 | Ht 69.5 in | Wt 250.0 lb

## 2021-03-08 DIAGNOSIS — M79641 Pain in right hand: Secondary | ICD-10-CM | POA: Insufficient documentation

## 2021-03-08 DIAGNOSIS — Z79899 Other long term (current) drug therapy: Secondary | ICD-10-CM | POA: Insufficient documentation

## 2021-03-08 DIAGNOSIS — M25531 Pain in right wrist: Secondary | ICD-10-CM | POA: Insufficient documentation

## 2021-03-08 DIAGNOSIS — G894 Chronic pain syndrome: Secondary | ICD-10-CM | POA: Insufficient documentation

## 2021-03-08 DIAGNOSIS — G5603 Carpal tunnel syndrome, bilateral upper limbs: Secondary | ICD-10-CM | POA: Diagnosis not present

## 2021-03-08 MED ORDER — OXYCODONE-ACETAMINOPHEN 10-325 MG PO TABS: 1.0000 | ORAL_TABLET | Freq: Three times a day (TID) | ORAL | 0 refills | Status: DC | PRN

## 2021-03-08 MED ORDER — MORPHINE SULFATE ER 15 MG PO TBCR: 15.0000 mg | EXTENDED_RELEASE_TABLET | Freq: Every day | ORAL | 0 refills | Status: DC

## 2021-03-08 NOTE — Progress Notes (Signed)
PROVIDER NOTE: Information contained herein reflects review and annotations entered in association with encounter. Interpretation of such information and data should be left to medically-trained personnel. Information provided to patient can be located elsewhere in the medical record under "Patient Instructions". Document created using STT-dictation technology, any transcriptional errors that may result from process are unintentional.    Patient: Nathan Ramos  Service Category: E/M  Provider: Gillis Santa, MD  DOB: [birth year]  DOS: 03/08/2021  Specialty: Interventional Pain Management  MRN: [id number]  Setting: Ambulatory outpatient  [facility]P: [facility]  Type: Established Patient    Referring Provider: [facility]  Location: Office  Delivery: Face-to-face     HPI  Nathan Ramos, a 50 y.o. year old male, is here today because of his Chronic pain syndrome [G89.4]. Nathan Ramos primary complain today is Pain ("chronic body pain/everywhere") Last encounter: My last encounter with him was on 12/09/2020. Pertinent problems: Nathan Ramos has Medullary cystic disease of the kidney; Chronic pain; Gout due to renal impairment; Primary localized osteoarthritis of right knee; Chronic pain syndrome; and Opioid use on their pertinent problem list. Pain Assessment: Severity of Chronic pain is reported as a 6 /10. Location: Other (Comment) ("chronic body pain") Right,Left/ . Onset:  . Quality:  . Timing:  . Modifying factor(s): meds. Vitals:  height is 5' 9.5" (1.765 m) and weight is 250 lb (113.4 kg). His temporal temperature is 99 F (37.2 C). His blood pressure is 155/92 (abnormal) and his pulse is 95. His respiration is 16 and oxygen saturation is 99%.   Reason for encounter: medication management.    No change in medical history since last visit.  Patient's pain is at baseline.  Patient continues multimodal pain regimen as prescribed.  States that it provides pain relief and  improvement in functional status.   Pharmacotherapy Assessment   02/11/2021  12/09/2020   1  Morphine Sulf Er 15 Mg Tablet  30.00  30  [facility]  [id number]  [facility] ([id number])  0/0  15.00 MME  Medicare  [place]    02/11/2021  12/09/2020   1  Oxycodone-Acetaminophen 10-325  90.00  30  Bi Lat  [id number]  [facility] ([id number])  0/0  45.00 MME  Medicare  [place]      Analgesic: Percocet 10 mg 3 times daily as needed, quantity 90/month, MME 45, morphine 15 mg daily, MME: 15   Monitoring: [place] PMP: PDMP not reviewed this encounter.       Pharmacotherapy: No side-effects or adverse reactions reported. Compliance: No problems identified. Effectiveness: Clinically acceptable.  Rise Patience, RN  03/08/2021 11:24 AM  Sign when Signing Visit Nursing Pain Medication Assessment:  Safety precautions to be maintained throughout the outpatient stay will include: orient to surroundings, keep bed in low position, maintain call bell within reach at all times, provide assistance with transfer out of bed and ambulation.  Medication Inspection Compliance: Pill count conducted under aseptic conditions, in front of the patient. Neither the pills nor the bottle was removed from the patient's sight at any time. Once count was completed pills were immediately returned to the patient in their original bottle.  Medication #1: Morphine ER (MSContin) Pill/Patch Count: 4 of 30 pills remain Pill/Patch Appearance: Markings consistent with prescribed medication Bottle Appearance: Standard pharmacy container. Clearly labeled. Filled Date: 04 / 01 / 22 Last Medication intake:  Today  Medication #2: Oxycodone/APAP Pill/Patch Count: 15 of 90 pills remain Pill/Patch Appearance: Markings consistent with prescribed medication Bottle Appearance:  Standard pharmacy container. Clearly labeled. Filled Date: 04 / 01 / 2022 Last Medication intake:  Today    UDS:  Summary  Date Value Ref Range Status  09/02/2020 Note  Final    Comment:     ==================================================================== ToxASSURE Select 13 (MW) ==================================================================== Test                             Result       Flag       Units  Drug Present and Declared for Prescription Verification   Morphine                       5191         EXPECTED   ng/mg creat   Normorphine                    83           EXPECTED   ng/mg creat    Potential sources of large amounts of morphine in the absence of    codeine include administration of morphine or use of heroin.     Normorphine is an expected metabolite of morphine.    Hydromorphone                  59           EXPECTED   ng/mg creat    Hydromorphone may be present as a metabolite of morphine;    concentrations of hydromorphone rarely exceed 5% of the morphine    concentration when this is the source of hydromorphone.    Oxycodone                      1294         EXPECTED   ng/mg creat   Oxymorphone                    2940         EXPECTED   ng/mg creat   Noroxycodone                   966          EXPECTED   ng/mg creat   Noroxymorphone                 412          EXPECTED   ng/mg creat    Sources of oxycodone are scheduled prescription medications.    Oxymorphone, noroxycodone, and noroxymorphone are expected    metabolites of oxycodone. Oxymorphone is also available as a    scheduled prescription medication.  ==================================================================== Test                      Result    Flag   Units      Ref Range   Creatinine              116              mg/dL      >=20 ==================================================================== Declared Medications:  The flagging and interpretation on this report are based on the  following declared medications.  Unexpected results may arise from  inaccuracies in the declared medications.   **Note: The testing scope of this panel includes these medications:   Morphine  (MS Contin)  Oxycodone (Percocet)   **Note: The  testing scope of this panel does not include the  following reported medications:   Acetaminophen (Percocet)  Amlodipine  Atorvastatin  Colchicine  Febuxostat  Furosemide  Losartan  Naloxone (Narcan)  Prednisone ==================================================================== For clinical consultation, please call [phone]. ====================================================================      ROS  Constitutional: Denies any fever or chills Gastrointestinal: No reported hemesis, hematochezia, vomiting, or acute GI distress Musculoskeletal: Denies any acute onset joint swelling, redness, loss of ROM, or weakness Neurological: No reported episodes of acute onset apraxia, aphasia, dysarthria, agnosia, amnesia, paralysis, loss of coordination, or loss of consciousness  Medication Review  Febuxostat, amLODipine, atorvastatin, colchicine, furosemide, losartan, morphine, naloxone, and oxyCODONE-acetaminophen  History Review  Allergy: Nathan Ramos is allergic to nsaids. Drug: Nathan Ramos  reports no history of drug use. Alcohol:  reports no history of alcohol use. Tobacco:  reports that he has been smoking cigarettes. He has a 11.00 pack-year smoking history. He has never used smokeless tobacco. Social: Nathan Ramos  reports that he has been smoking cigarettes. He has a 11.00 pack-year smoking history. He has never used smokeless tobacco. He reports that he does not drink alcohol and does not use drugs. Medical:  has a past medical history of Anemia, Bone spur of other site, Chronic pain, CKD (chronic kidney disease), Controlled substance agreement signed, Gout, Gouty arthritis, History of YAG laser capsulotomy of lens of left eye, Hyperlipidemia, Hypertension, IFG (impaired fasting glucose), Left knee DJD, Medullary cystic disease of the kidney, Primary localized osteoarthritis of right knee, and Smoker. 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 Family: family history includes COPD in his maternal grandmother and mother; Kidney disease in his mother; Stroke (age of onset: 61) in his father; Thyroid disease in his mother.  Laboratory Chemistry Profile   Renal Lab Results  Component Value Date   BUN 47 (H) 06/01/2020   CREATININE 3.06 (H) 06/01/2020   BCR 15 06/01/2020   GFRAA 27 (L) 06/01/2020   GFRNONAA 23 (L) 06/01/2020     Hepatic Lab Results  Component Value Date   AST 15 06/01/2020   ALT 14 10/11/2019   ALBUMIN 4.6 06/01/2020   ALKPHOS 103 06/01/2020     Electrolytes Lab Results  Component Value Date   NA 138 06/01/2020   K 5.7 (H) 06/01/2020   CL 101 06/01/2020   CALCIUM 10.1 06/01/2020     Bone No results found for: VD25OH, VD125OH2TOT, DT2671IW5, YK9983JA2, 25OHVITD1, 25OHVITD2, 25OHVITD3, TESTOFREE, TESTOSTERONE   Inflammation (CRP: Acute Phase) (ESR: Chronic Phase) No results found for: CRP, ESRSEDRATE, LATICACIDVEN     Note: Above Lab results reviewed.  Recent Imaging Review  CT HAND RIGHT WO CONTRAST CLINICAL DATA:  Right hand pain, swelling and limited range of motion.  EXAM: CT OF THE RIGHT HAND WITHOUT CONTRAST  TECHNIQUE: Multidetector CT imaging of the right hand was performed according to the standard protocol. Multiplanar CT image reconstructions were also generated.  COMPARISON:  Radiographs 07/03/2018  FINDINGS: Significant age advanced degenerative changes involving the hands and wrists. Moderate interphalangeal joint space narrowing and early spurring.  The MCP joints demonstrate scattered erosions and soft  tissue calcifications. There is a sizable erosion in the second metacarpal head away from the joint. There is  also significant calcific tendinitis involving the flexor tendons of the fingers.  Advanced degenerative changes at the wrist with carpal crowding and erosive changes. Chondrocalcinosis is also noted and there is calcification in the extensor tendons and flexor tendons at the wrist.  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  No acute bony findings or destructive bony changes.  IMPRESSION: 1. Significant age advanced degenerative changes involving the hands and wrists as described above. 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 2. No acute bony findings or destructive bony changes. 3. Significant and fairly diffuse calcific tendinitis.  Electronically Signed   By: Marijo Sanes M.D.   On: 09/14/2020 13:54 Note: Reviewed        Physical Exam  General appearance: Well nourished, well developed, and well hydrated. In no apparent acute distress Mental status: Alert, oriented x 3 (person, place, & time)       Respiratory: No evidence of acute respiratory distress Eyes: PERLA Vitals: BP (!) 155/92   Pulse 95   Temp 99 F (37.2 C) (Temporal)   Resp 16   Ht 5' 9.5" (1.765 m)   Wt 250 lb (113.4 kg)   SpO2 99%   BMI 36.39 kg/m  BMI: Estimated body mass index is 36.39 kg/m as calculated from the following:   Height as of this encounter: 5' 9.5" (1.765 m).   Weight as of this encounter: 250 lb (113.4 kg). Ideal: Ideal body weight: 71.8 kg (158 lb 6.4 oz) Adjusted ideal body weight: 88.5 kg (195 lb 0.6 oz)    Upper Extremity (UE) Exam    Side:Right upper extremity  Side:Left upper extremity  Skin & Extremity Inspection: Edema of bilateral hands  Skin & Extremity Inspection:Edema  Functional FHL:KTGY restricted range of motion  Functional BWL:SLHT restricted range of motion of wrist joint  Muscle Tone/Strength:Functionally  intact. No obvious neuro-muscular anomalies detected.  Muscle Tone/Strength:Functionally intact. No obvious neuro-muscular anomalies detected.  Sensory (Neurological):Myogenic/musculoskeletal  Sensory (Neurological):Musculoskeletal     Lumbar Spine Area Exam  Skin & Axial Inspection:No masses, redness, or swelling Alignment:Symmetrical Functional DSK:AJGOTLXBWIOM ROM Stability:No instability detected Muscle Tone/Strength:Functionally intact. No obvious neuro-muscular anomalies detected. Sensory (Neurological):Musculoskeletal pain pattern  Lower Extremity Exam    Side:Right lower extremity  Side:Left lower extremity  Stability:No instability observed  Stability:No instability observed  Skin & Extremity Inspection:Skin color, temperature, and hair growth are WNL. No peripheral edema or cyanosis. No masses, redness, swelling, asymmetry, or associated skin lesions. No contractures.  Skin & Extremity Inspection:Skin color, temperature, and hair growth are WNL. No peripheral edema or cyanosis. No masses, redness, swelling, asymmetry, or associated skin lesions. No contractures.  Functional BTD:HRCBULAGTXMI ROM   Functional WOE:HOZYYQMGNOIB ROM   Muscle Tone/Strength:Functionally intact. No obvious neuro-muscular anomalies detected.  Muscle Tone/Strength:Functionally intact. No obvious neuro-muscular anomalies detected.  Sensory (Neurological):Musculoskeletal pain pattern  Sensory (Neurological):Musculoskeletal pain pattern   Assessment   Status Diagnosis  Controlled Controlled Controlled 1. Chronic pain syndrome   2. Bilateral carpal tunnel syndrome   3. Right hand pain   4. Right wrist pain   5. Controlled substance agreement signed       Plan of Care   Nathan Ramos has a current medication list which includes the following long-term medication(s): amlodipine,  atorvastatin, colchicine, furosemide, losartan, [START ON 03/13/2021] morphine, [START ON 04/12/2021] morphine, [START ON 05/12/2021] morphine, [START ON 03/13/2021] oxycodone-acetaminophen, [START ON 04/12/2021] oxycodone-acetaminophen, and [START ON 05/12/2021] oxycodone-acetaminophen.  Pharmacotherapy (Medications Ordered): Meds ordered this encounter  Medications  .  oxyCODONE-acetaminophen (PERCOCET) 10-325 MG tablet    Sig: Take 1 tablet by mouth every 8 (eight) hours as needed for pain. Must last 30 days.    Dispense:  90 tablet    Refill:  0    [place] STOP ACT - Not applicable. Fill one day early if pharmacy is closed on scheduled refill date.  [place] Kitchen oxyCODONE-acetaminophen (PERCOCET) 10-325 MG tablet    Sig: Take 1 tablet by mouth every 8 (eight) hours as needed for pain. Must last 30 days.    Dispense:  90 tablet    Refill:  0    [place] STOP ACT - Not applicable. Fill one day early if pharmacy is closed on scheduled refill date.  Marland Kitchen oxyCODONE-acetaminophen (PERCOCET) 10-325 MG tablet    Sig: Take 1 tablet by mouth every 8 (eight) hours as needed for pain. Must last 30 days.    Dispense:  90 tablet    Refill:  0    [place] STOP ACT - Not applicable. Fill one day early if pharmacy is closed on scheduled refill date.  . morphine (MS CONTIN) 15 MG 12 hr tablet    Sig: Take 1 tablet (15 mg total) by mouth daily. Must last 30 days.    Dispense:  30 tablet    Refill:  0    [place] STOP ACT - Not applicable. Fill one day early if pharmacy is closed on scheduled refill date.  . morphine (MS CONTIN) 15 MG 12 hr tablet    Sig: Take 1 tablet (15 mg total) by mouth daily. Must last 30 days.    Dispense:  30 tablet    Refill:  0    [place] STOP ACT - Not applicable. Fill one day early if pharmacy is closed on scheduled refill date.  . morphine (MS CONTIN) 15 MG 12 hr tablet    Sig: Take 1 tablet (15 mg total) by mouth daily. Must last 30 days.    Dispense:  30 tablet    Refill:  0    [place] STOP ACT - Not applicable. Fill one  day early if pharmacy is closed on scheduled refill date.   Orders:  Orders Placed This Encounter  Procedures  . ToxASSURE Select 13 (MW), Urine    Volume: 30 ml(s). Minimum 3 ml of urine is needed. Document temperature of fresh sample. Indications: Long term (current) use of opiate analgesic [phone])    Order Specific Question:   Release to patient    Answer:   Immediate   Follow-up plan:   Return in about 3 months (around 06/07/2021) for Medication Management, in person.   Recent Visits Date Type Provider Dept  12/09/20 Office Visit Gillis Santa, MD [facility]  Showing recent visits within past 90 days and meeting all other requirements Today's Visits Date Type Provider Dept  03/08/21 Office Visit Gillis Santa, MD [facility]  Showing today's visits and meeting all other requirements Future Appointments No visits were found meeting these conditions. Showing future appointments within next 90 days and meeting all other requirements  I discussed the assessment and treatment plan with the patient. The patient was provided an opportunity to ask questions and all were answered. The patient agreed with the plan and demonstrated an understanding of the instructions.  Patient advised to call back or seek an in-person evaluation if the symptoms or condition worsens.  Duration of encounter: 30 minutes.  Note by: Gillis Santa, MD Date: 03/08/2021; Time: 11:39 AM

## 2021-03-08 NOTE — Progress Notes (Signed)
Nursing Pain Medication Assessment:  Safety precautions to be maintained throughout the outpatient stay will include: orient to surroundings, keep bed in low position, maintain call bell within reach at all times, provide assistance with transfer out of bed and ambulation.  Medication Inspection Compliance: Pill count conducted under aseptic conditions, in front of the patient. Neither the pills nor the bottle was removed from the patient's sight at any time. Once count was completed pills were immediately returned to the patient in their original bottle.  Medication #1: Morphine ER (MSContin) Pill/Patch Count: 4 of 30 pills remain Pill/Patch Appearance: Markings consistent with prescribed medication Bottle Appearance: Standard pharmacy container. Clearly labeled. Filled Date: 04 / 01 / 22 Last Medication intake:  Today  Medication #2: Oxycodone/APAP Pill/Patch Count: 15 of 90 pills remain Pill/Patch Appearance: Markings consistent with prescribed medication Bottle Appearance: Standard pharmacy container. Clearly labeled. Filled Date: 04 / 01 / 2022 Last Medication intake:  Today

## 2021-03-17 LAB — TOXASSURE SELECT 13 (MW), URINE

## 2021-03-23 ENCOUNTER — Other Ambulatory Visit: Payer: Self-pay | Admitting: Student in an Organized Health Care Education/Training Program

## 2021-06-07 ENCOUNTER — Ambulatory Visit
Payer: Medicare Other | Attending: Student in an Organized Health Care Education/Training Program | Admitting: Student in an Organized Health Care Education/Training Program

## 2021-06-07 ENCOUNTER — Encounter: Payer: Self-pay | Admitting: Student in an Organized Health Care Education/Training Program

## 2021-06-07 ENCOUNTER — Other Ambulatory Visit: Payer: Self-pay

## 2021-06-07 DIAGNOSIS — G894 Chronic pain syndrome: Secondary | ICD-10-CM

## 2021-06-07 DIAGNOSIS — M1A39X1 Chronic gout due to renal impairment, multiple sites, with tophus (tophi): Secondary | ICD-10-CM | POA: Diagnosis not present

## 2021-06-07 DIAGNOSIS — Z96653 Presence of artificial knee joint, bilateral: Secondary | ICD-10-CM

## 2021-06-07 DIAGNOSIS — M170 Bilateral primary osteoarthritis of knee: Secondary | ICD-10-CM

## 2021-06-07 DIAGNOSIS — Z79899 Other long term (current) drug therapy: Secondary | ICD-10-CM

## 2021-06-07 DIAGNOSIS — G5603 Carpal tunnel syndrome, bilateral upper limbs: Secondary | ICD-10-CM | POA: Diagnosis not present

## 2021-06-07 DIAGNOSIS — Q615 Medullary cystic kidney: Secondary | ICD-10-CM

## 2021-06-07 MED ORDER — MORPHINE SULFATE ER 15 MG PO TBCR: 15.0000 mg | EXTENDED_RELEASE_TABLET | Freq: Every day | ORAL | 0 refills | Status: DC

## 2021-06-07 MED ORDER — OXYCODONE-ACETAMINOPHEN 10-325 MG PO TABS: 1.0000 | ORAL_TABLET | Freq: Three times a day (TID) | ORAL | 0 refills | Status: DC | PRN

## 2021-06-07 NOTE — Progress Notes (Signed)
Patient: Nathan Ramos  Service Category: E/M  Provider: Gillis Santa, MD  DOB: 09/14/1971  DOS: 06/07/2021  Location: Office  MRN: [id number]  Setting: Ambulatory outpatient  Referring Provider: [place]*  Type: Established Patient  Specialty: Interventional Pain Management  PCP: [facility]  Location: Remote location  Delivery: TeleHealth     Virtual Encounter - Pain Management PROVIDER NOTE: Information contained herein reflects review and annotations entered in association with encounter. Interpretation of such information and data should be left to medically-trained personnel. Information provided to patient can be located elsewhere in the medical record under "Patient Instructions". Document created using STT-dictation technology, any transcriptional errors that may result from process are unintentional.    Contact & Pharmacy Preferred: [phone] Home: [phone] (home) Mobile: [phone] (mobile) E-mail: [email or web address]  [facility] [place], [address] [place] [address] Phone: [phone] Fax: [phone]   Pre-screening  Nathan Ramos offered "in-person" vs "virtual" encounter. Nathan Ramos indicated preferring virtual for this encounter.   Reason COVID-19*  Social distancing based on CDC and AMA recommendations.   I contacted Nathan Ramos on 06/07/2021 via video conference.      I clearly identified myself as Gillis Santa, MD. I verified that I was speaking with the correct person using two identifiers (Name: Nathan Ramos, and date of birth: [birth year]).  Consent I sought verbal advanced consent from Nathan Ramos for virtual visit interactions. I informed Nathan Ramos of possible security and privacy concerns, risks, and limitations associated with providing "not-in-person" medical evaluation and management services. I also informed Nathan Ramos of the availability of  "in-person" appointments. Finally, I informed him that there would be a charge for the virtual visit and that Nathan Ramos could be  personally, fully or partially, financially responsible for it. Nathan Ramos expressed understanding and agreed to proceed.   Historic Elements   Nathan Ramos is a 50 y.o. year old, male patient evaluated today after our last contact on 03/23/2021. Nathan Ramos  has a past medical history of Anemia, Bone spur of other site, Chronic pain, CKD (chronic kidney disease), Controlled substance agreement signed, Gout, Gouty arthritis, History of YAG laser capsulotomy of lens of left eye, Hyperlipidemia, Hypertension, IFG (impaired fasting glucose), Left knee DJD, Medullary cystic disease of the kidney, Primary localized osteoarthritis of right knee, and Smoker. 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 Nathan Ramos has a current medication list which includes the following prescription(s): amlodipine, atorvastatin, colchicine, febuxostat, furosemide, losartan, [START ON 06/11/2021] morphine, naloxone, oxycodone-acetaminophen, oxycodone-acetaminophen, and [START ON 06/11/2021] oxycodone-acetaminophen. Nathan Ramos  reports that Nathan Ramos has been smoking cigarettes. Nathan Ramos has a 11.00 pack-year smoking history. Nathan Ramos has never used smokeless tobacco. Nathan Ramos reports that Nathan Ramos does not drink alcohol and does not use drugs. Nathan Ramos is allergic to nsaids.   HPI  Today, Nathan Ramos is being contacted for medication management.  Patient unfortunately is not feeling well.  His wife had contracted COVID.  Nathan Ramos has been sick for the last week and a half Nathan Ramos has tested negative for COVID.  Nathan Ramos states that Nathan Ramos  has a cough over the last  and this morning of vomiting.  As result Nathan Ramos was unable to make it to his appointment today.  Nathan Ramos was requesting to do a virtual visit to refill his chronic pain medications as below.  No change in dose.  Patient has been therapy.  UDS up-to-date and appropriate.  Pharmacotherapy Assessment   Analgesic: Percocet 10 mg 3 times daily as needed, quantity 90/month, MME 45, morphine 15 mg daily, MME: 15   Monitoring: [place] PMP: PDMP reviewed during this encounter.       Pharmacotherapy: No side-effects or adverse reactions reported. Compliance: No problems identified. Effectiveness: Clinically acceptable. Plan: Refer to "POC". UDS:  Summary  Date Value Ref Range Status  03/08/2021 Note  Final    Comment:    ==================================================================== [employer] Select 13 (MW) ==================================================================== Test                             Result       Flag       Units  Drug Present and Declared for Prescription Verification   Morphine                       2628         EXPECTED   ng/mg creat   Normorphine                    128          EXPECTED   ng/mg creat    Potential sources of morphine include administration of codeine or    morphine, use of heroin, or ingestion of poppy seeds.     Normorphine is an expected metabolite of morphine.    Oxycodone                      1565         EXPECTED   ng/mg creat   Oxymorphone                    3828         EXPECTED   ng/mg creat   Noroxycodone                   1688         EXPECTED   ng/mg creat   Noroxymorphone                 623          EXPECTED   ng/mg creat    Sources of oxycodone are scheduled prescription medications.    Oxymorphone, noroxycodone, and noroxymorphone are expected    metabolites of oxycodone. Oxymorphone is also available as a    scheduled prescription  medication.  ==================================================================== Test                      Result    Flag   Units      Ref Range   Creatinine              40               mg/dL      >=20 ==================================================================== Declared Medications:  The flagging and interpretation on this report are based on the  following declared medications.  Unexpected results may arise from  inaccuracies in the declared medications.   **Note: The testing scope of this panel includes these medications:  Morphine (MS Contin)  Oxycodone (Percocet)   **Note: The testing scope of this panel does not include the  following reported medications:   Acetaminophen (Percocet)  Amlodipine (Norvasc)  Atorvastatin (Lipitor)  Colchicine  Febuxostat  Furosemide (Lasix)  Losartan (Cozaar)  Naloxone (Narcan) ==================================================================== For clinical consultation, please call [phone]. ====================================================================      Laboratory Chemistry Profile   Renal Lab Results  Component Value Date   BUN 47 (H) 06/01/2020   CREATININE 3.06 (H) 06/01/2020   BCR 15 06/01/2020   GFRAA 27 (L) 06/01/2020   GFRNONAA 23 (L) 06/01/2020    Hepatic Lab Results  Component Value Date   AST 15 06/01/2020   ALT 14 10/11/2019   ALBUMIN 4.6 06/01/2020   ALKPHOS 103 06/01/2020    Electrolytes Lab Results  Component Value Date   NA 138 06/01/2020   K 5.7 (H) 06/01/2020   CL 101 06/01/2020   CALCIUM 10.1 06/01/2020    Bone No results found for: VD25OH, VD125OH2TOT, VZ8588FO2, DX4128NO6, 25OHVITD1, 25OHVITD2, 25OHVITD3, TESTOFREE, TESTOSTERONE  Inflammation (CRP: Acute Phase) (ESR: Chronic Phase) No results found for: CRP, ESRSEDRATE, LATICACIDVEN       Note: Above Lab results reviewed.  Imaging  CT HAND RIGHT WO CONTRAST CLINICAL DATA:  Right hand pain, swelling and limited  range of motion.  EXAM: CT OF THE RIGHT HAND WITHOUT CONTRAST  TECHNIQUE: Multidetector CT imaging of the right hand was performed according to the standard protocol. Multiplanar CT image reconstructions were also generated.  COMPARISON:  Radiographs 07/03/2018  FINDINGS: Significant age advanced degenerative changes involving the hands and wrists. Moderate interphalangeal joint space narrowing and early spurring.  The MCP joints demonstrate scattered erosions and soft tissue calcifications. There is a sizable erosion in the second metacarpal head away from the joint. There is also significant calcific tendinitis involving the flexor tendons of the fingers.  Advanced degenerative changes at the wrist with carpal crowding and erosive changes. Chondrocalcinosis is also noted and there is calcification in the extensor tendons and flexor tendons at the wrist.  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  No acute bony findings or destructive bony changes.  IMPRESSION: 1. Significant age advanced degenerative changes involving the hands and wrists as described above. 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 2. No acute bony findings or destructive bony changes. 3. Significant and fairly diffuse calcific tendinitis.  Electronically Signed   By: Marijo Sanes M.D.   On: 09/14/2020 13:54  Assessment  The primary encounter diagnosis was Bilateral carpal tunnel syndrome. 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  Plan of Care    Mr. AQUAN KOPE has a current medication list which includes the following long-term medication(s): amlodipine, atorvastatin, colchicine, furosemide, losartan, [START ON 06/11/2021]  morphine, oxycodone-acetaminophen, oxycodone-acetaminophen, and [START ON 06/11/2021] oxycodone-acetaminophen.  Pharmacotherapy (Medications Ordered): Meds ordered this encounter  Medications   morphine (MS CONTIN) 15 MG 12 hr tablet    Sig: Take 1 tablet (15 mg total) by mouth daily. Must last 30 days.    Dispense:  30 tablet    Refill:  0    [place] STOP ACT - Not applicable. Fill one day early if pharmacy is closed on scheduled refill date.   oxyCODONE-acetaminophen (PERCOCET) 10-325 MG tablet  Sig: Take 1 tablet by mouth every 8 (eight) hours as needed for pain. Must last 30 days.    Dispense:  90 tablet    Refill:  0    [place] STOP ACT - Not applicable. Fill one day early if pharmacy is closed on scheduled refill date.     Follow-up plan:   Return in about 30 days (around 07/07/2021) for Medication Management, in person.    Recent Visits No visits were found meeting these conditions. Showing recent visits within past 90 days and meeting all other requirements Today's Visits Date Type Provider Dept  06/07/21 Telemedicine Gillis Santa, MD [facility]  Showing today's visits and meeting all other requirements Future Appointments No visits were found meeting these conditions. Showing future appointments within next 90 days and meeting all other requirements I discussed the assessment and treatment plan with the patient. The patient was provided an opportunity to ask questions and all were answered. The patient agreed with the plan and demonstrated an understanding of the instructions.  Patient advised to call back or seek an in-person evaluation if the symptoms or condition worsens.  Duration of encounter: 75mnutes.  Note by: BGillis Santa MD Date: 06/07/2021; Time: 10:53 AM

## 2021-07-05 ENCOUNTER — Ambulatory Visit
Payer: Medicare Other | Attending: Student in an Organized Health Care Education/Training Program | Admitting: Student in an Organized Health Care Education/Training Program

## 2021-07-05 ENCOUNTER — Encounter: Payer: Self-pay | Admitting: Student in an Organized Health Care Education/Training Program

## 2021-07-05 ENCOUNTER — Other Ambulatory Visit: Payer: Self-pay

## 2021-07-05 VITALS — BP 184/113 | HR 107 | Temp 98.1°F | Resp 16 | Ht 70.0 in | Wt 250.0 lb

## 2021-07-05 DIAGNOSIS — M170 Bilateral primary osteoarthritis of knee: Secondary | ICD-10-CM | POA: Diagnosis present

## 2021-07-05 DIAGNOSIS — Z79899 Other long term (current) drug therapy: Secondary | ICD-10-CM | POA: Diagnosis present

## 2021-07-05 DIAGNOSIS — Z96653 Presence of artificial knee joint, bilateral: Secondary | ICD-10-CM | POA: Diagnosis present

## 2021-07-05 DIAGNOSIS — G894 Chronic pain syndrome: Secondary | ICD-10-CM | POA: Insufficient documentation

## 2021-07-05 DIAGNOSIS — G5603 Carpal tunnel syndrome, bilateral upper limbs: Secondary | ICD-10-CM | POA: Insufficient documentation

## 2021-07-05 DIAGNOSIS — Q615 Medullary cystic kidney: Secondary | ICD-10-CM | POA: Insufficient documentation

## 2021-07-05 DIAGNOSIS — M1A39X1 Chronic gout due to renal impairment, multiple sites, with tophus (tophi): Secondary | ICD-10-CM | POA: Insufficient documentation

## 2021-07-05 MED ORDER — OXYCODONE-ACETAMINOPHEN 10-325 MG PO TABS: 1.0000 | ORAL_TABLET | Freq: Four times a day (QID) | ORAL | 0 refills | Status: DC | PRN

## 2021-07-05 MED ORDER — OXYCODONE-ACETAMINOPHEN 10-325 MG PO TABS
1.0000 | ORAL_TABLET | Freq: Four times a day (QID) | ORAL | 0 refills | Status: DC | PRN
Start: 2021-08-10 — End: 2021-09-27

## 2021-07-05 NOTE — Progress Notes (Signed)
Nursing Pain Medication Assessment:  Safety precautions to be maintained throughout the outpatient stay will include: orient to surroundings, keep bed in low position, maintain call bell within reach at all times, provide assistance with transfer out of bed and ambulation.  Medication Inspection Compliance: Pill count conducted under aseptic conditions, in front of the patient. Neither the pills nor the bottle was removed from the patient's sight at any time. Once count was completed pills were immediately returned to the patient in their original bottle.  Medication: Morphine ER (MSContin) Pill/Patch Count:  6 of 30 pills remain Pill/Patch Appearance: Markings consistent with prescribed medication Bottle Appearance: Standard pharmacy container. Clearly labeled. Filled Date: 07 / 26 / 2022 Last Medication intake:  Today  Percocet 17/90 Filled 06/07/2021 Last took today

## 2021-07-05 NOTE — Progress Notes (Signed)
PROVIDER NOTE: Information contained herein reflects review and annotations entered in association with encounter. Interpretation of such information and data should be left to medically-trained personnel. Information provided to patient can be located elsewhere in the medical record under "Patient Instructions". Document created using STT-dictation technology, any transcriptional errors that may result from process are unintentional.    Patient: Nathan Ramos  Service Category: E/M  Provider: Gillis Santa, MD  DOB: January 08, 1971  DOS: 07/05/2021  Specialty: Interventional Pain Management  MRN: [id number]  Setting: Ambulatory outpatient  PCP: [facility]  Type: Established Patient    Referring Provider: [facility]  Location: Office  Delivery: Face-to-face     HPI  [name], a 50 y.o. year old male, is here today because of his Chronic pain syndrome [G89.4]. Mr. Acree primary complain today is Knee Pain (bilateral) and Wrist Pain (left) Last encounter: My last encounter with him was on 06/07/2021. Pertinent problems: Mr. Gohman has Medullary cystic disease of the kidney; Chronic pain; Gout due to renal impairment; Primary localized osteoarthritis of right knee; Chronic pain syndrome; and Opioid use on their pertinent problem list. Pain Assessment: Severity of Chronic pain is reported as a 6 /10. Location: Knee Right, Left/denies. Onset: More than a month ago. Quality: Sharp. Timing: Constant. Modifying factor(s): nothing. Vitals:  height is 5' 10"  (1.778 m) and weight is 250 lb (113.4 kg). His temporal temperature is 98.1 F (36.7 C). His blood pressure is 184/113 (abnormal) and his pulse is 107 (abnormal). His respiration is 16 and oxygen saturation is 100%.   Reason for encounter: medication management.    Patient presents today for medication management.  His last visit with me was a virtual 1 on 06/07/2021 given his exposure to [place].  He states that his  morphine is becoming less effective.  He would like to discontinue this medication and increase his oxycodone as he finds more relief with that.  I informed him that we need to keep his total MME less than 60.  I will have him discontinue morphine and transition him to oxycodone 10 mg every 6 hours as needed, quantity 120/month.  I informed him that in the future we should consider a drug holiday to decrease his tolerance.  We discussed central sensitization as well as opioid-induced hyperalgesia.  We also reviewed the CT of his right hand which show significant arthropathy with chondrocalcinosis. He also has bilateral knee pain related to knee osteoarthritis. UDS up-to-date and appropriate.   Pharmacotherapy Assessment   06/11/2021  06/07/2021   1  Morphine Sulf Er 15 Mg Tablet 30.00  30  Bi Lat  [id number]  [facility] ([id number])  0/0  15.00 MME  Medicare  [place]    06/11/2021  06/07/2021   1  Oxycodone-Acetaminophen 10-325 90.00  30  Bi Lat  [id number]  [facility] ([id number])  0/0  45.00 MME  Medicare  [place]      Pharmacotherapy Assessment  Analgesic: Percocet 10 mg 3 times daily as needed, quantity 90/month, MME 45, morphine 15 mg daily, MME: 15   Monitoring: [place] PMP: PDMP not reviewed this encounter.       Pharmacotherapy: No side-effects or adverse reactions reported. Compliance: No problems identified. Effectiveness: Clinically acceptable.  UDS:  Summary  Date Value Ref Range Status  03/08/2021 Note  Final    Comment:    ==================================================================== [employer] Select 13 (MW) ==================================================================== Test  Result       Flag       Units  Drug Present and Declared for Prescription Verification   Morphine                       2628         EXPECTED   ng/mg creat   Normorphine                    128          EXPECTED   ng/mg creat    Potential sources of morphine include administration of codeine or     morphine, use of heroin, or ingestion of poppy seeds.     Normorphine is an expected metabolite of morphine.    Oxycodone                      1565         EXPECTED   ng/mg creat   Oxymorphone                    3828         EXPECTED   ng/mg creat   Noroxycodone                   1688         EXPECTED   ng/mg creat   Noroxymorphone                 623          EXPECTED   ng/mg creat    Sources of oxycodone are scheduled prescription medications.    Oxymorphone, noroxycodone, and noroxymorphone are expected    metabolites of oxycodone. Oxymorphone is also available as a    scheduled prescription medication.  ==================================================================== Test                      Result    Flag   Units      Ref Range   Creatinine              40               mg/dL      >=20 ==================================================================== Declared Medications:  The flagging and interpretation on this report are based on the  following declared medications.  Unexpected results may arise from  inaccuracies in the declared medications.   **Note: The testing scope of this panel includes these medications:   Morphine (MS Contin)  Oxycodone (Percocet)   **Note: The testing scope of this panel does not include the  following reported medications:   Acetaminophen (Percocet)  Amlodipine (Norvasc)  Atorvastatin (Lipitor)  Colchicine  Febuxostat  Furosemide (Lasix)  Losartan (Cozaar)  Naloxone (Narcan) ==================================================================== For clinical consultation, please call [phone]. ====================================================================       ROS  Constitutional: Denies any fever or chills Gastrointestinal: No reported hemesis, hematochezia, vomiting, or acute GI distress Musculoskeletal:  Right hand pain, low back pain, bilateral knee pain Neurological: No reported episodes of acute onset apraxia,  aphasia, dysarthria, agnosia, amnesia, paralysis, loss of coordination, or loss of consciousness  Medication Review  Febuxostat, amLODipine, atorvastatin, colchicine, furosemide, losartan, naloxone, and oxyCODONE-acetaminophen  History Review  Allergy: Mr. Regala is allergic to nsaids. Drug: Mr. Cardosa  reports no history of drug use. Alcohol:  reports no history of alcohol use. Tobacco:  reports that he has been smoking cigarettes.  He has a 11.00 pack-year smoking history. He has never used smokeless tobacco. Social: Mr. Bookwalter  reports that he has been smoking cigarettes. He has a 11.00 pack-year smoking history. He has never used smokeless tobacco. He reports that he does not drink alcohol and does not use drugs. Medical:  has a past medical history of Anemia, Bone spur of other site, Chronic pain, CKD (chronic kidney disease), Controlled substance agreement signed, Gout, Gouty arthritis, History of YAG laser capsulotomy of lens of left eye, Hyperlipidemia, Hypertension, IFG (impaired fasting glucose), Left knee DJD, Medullary cystic disease of the kidney, Primary localized osteoarthritis of right knee, and Smoker. Surgical: Mr. Hackbart  has a past surgical history that includes Carpal tunnel release (Left); Umbilical hernia repair (2010); Cataract extraction w/ intraocular lens implant (Left, 2008); Cataract extraction w/ intraocular lens implant (Right, 2008); Hernia repair; Total knee arthroplasty (Left, 03/30/2014); Eye surgery; Total knee arthroplasty (Right, 10/26/2014); and Joint replacement (Left, 03/2014). Family: family history includes COPD in his maternal grandmother and mother; Kidney disease in his mother; Stroke (age of onset: 89) in his father; Thyroid disease in his mother.  Laboratory Chemistry Profile   Renal Lab Results  Component Value Date   BUN 47 (H) 06/01/2020   CREATININE 3.06 (H) 06/01/2020   BCR 15 06/01/2020   GFRAA 27 (L) 06/01/2020   GFRNONAA 23 (L) 06/01/2020      Hepatic Lab Results  Component Value Date   AST 15 06/01/2020   ALT 14 10/11/2019   ALBUMIN 4.6 06/01/2020   ALKPHOS 103 06/01/2020     Electrolytes Lab Results  Component Value Date   NA 138 06/01/2020   K 5.7 (H) 06/01/2020   CL 101 06/01/2020   CALCIUM 10.1 06/01/2020     Bone No results found for: VD25OH, VD125OH2TOT, ON6295MW4, XL2440NU2, 25OHVITD1, 25OHVITD2, 25OHVITD3, TESTOFREE, TESTOSTERONE   Inflammation (CRP: Acute Phase) (ESR: Chronic Phase) No results found for: CRP, ESRSEDRATE, LATICACIDVEN     Note: Above Lab results reviewed.  Recent Imaging Review  CT HAND RIGHT WO CONTRAST CLINICAL DATA:  Right hand pain, swelling and limited range of motion.  EXAM: CT OF THE RIGHT HAND WITHOUT CONTRAST  TECHNIQUE: Multidetector CT imaging of the right hand was performed according to the standard protocol. Multiplanar CT image reconstructions were also generated.  COMPARISON:  Radiographs 07/03/2018  FINDINGS: Significant age advanced degenerative changes involving the hands and wrists. Moderate interphalangeal joint space narrowing and early spurring.  The MCP joints demonstrate scattered erosions and soft tissue calcifications. There is a sizable erosion in the second metacarpal head away from the joint. There is also significant calcific tendinitis involving the flexor tendons of the fingers.  Advanced degenerative changes at the wrist with carpal crowding and erosive changes. Chondrocalcinosis is also noted and there is calcification in the extensor tendons and flexor tendons at the wrist.  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  No acute bony findings or destructive bony changes.  IMPRESSION: 1. Significant age advanced degenerative changes involving the hands and wrists as described above. 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 2. No acute bony findings or destructive bony changes. 3. Significant and fairly diffuse  calcific tendinitis.  Electronically Signed   By: Marijo Sanes M.D.   On: 09/14/2020 13:54 Note: Reviewed        Physical Exam  General appearance: Well nourished, well developed, and well hydrated. In no apparent acute distress Mental status: Alert, oriented x 3 (person, place, &  time)       Respiratory: No evidence of acute respiratory distress Eyes: PERLA Vitals: BP (!) 184/113 (BP Location: Left Arm, Patient Position: Sitting, Cuff Size: Normal) Comment: he has not takin his medicine this am.  Pulse (!) 107   Temp 98.1 F (36.7 C) (Temporal)   Resp 16   Ht 5' 10"  (1.778 m)   Wt 250 lb (113.4 kg)   SpO2 100%   BMI 35.87 kg/m  BMI: Estimated body mass index is 35.87 kg/m as calculated from the following:   Height as of this encounter: 5' 10"  (1.778 m).   Weight as of this encounter: 250 lb (113.4 kg). Ideal: Ideal body weight: 73 kg (160 lb 15 oz) Adjusted ideal body weight: 89.2 kg (196 lb 9 oz)     Upper Extremity (UE) Exam      Side: Right upper extremity   Side: Left upper extremity  Skin & Extremity Inspection:   Edema of bilateral hands   Skin & Extremity Inspection: Edema  Functional ROM:  Pain restricted range of motion       Functional ROM: Pain restricted range of motion of wrist joint     Muscle Tone/Strength: Functionally intact. No obvious neuro-muscular anomalies detected.   Muscle Tone/Strength: Functionally intact. No obvious neuro-muscular anomalies detected.  Sensory (Neurological):  Myogenic/musculoskeletal   Sensory (Neurological): Musculoskeletal              Lumbar Spine Area Exam  Skin & Axial Inspection: No masses, redness, or swelling Alignment: Symmetrical Functional ROM: Unrestricted ROM       Stability: No instability detected Muscle Tone/Strength: Functionally intact. No obvious neuro-muscular anomalies detected. Sensory (Neurological): Musculoskeletal pain pattern   Lower Extremity Exam      Side: Right lower extremity   Side: Left  lower extremity  Stability: No instability observed           Stability: No instability observed          Skin & Extremity Inspection: Skin color, temperature, and hair growth are WNL. No peripheral edema or cyanosis. No masses, redness, swelling, asymmetry, or associated skin lesions. No contractures.   Skin & Extremity Inspection: Skin color, temperature, and hair growth are WNL. No peripheral edema or cyanosis. No masses, redness, swelling, asymmetry, or associated skin lesions. No contractures.  Functional ROM: Unrestricted ROM                   Functional ROM: Unrestricted ROM                  Muscle Tone/Strength: Functionally intact. No obvious neuro-muscular anomalies detected.   Muscle Tone/Strength: Functionally intact. No obvious neuro-muscular anomalies detected.  Sensory (Neurological): Musculoskeletal pain pattern         Sensory (Neurological): Musculoskeletal pain pattern          Assessment   Status Diagnosis  Persistent Persistent Persistent 1. Chronic pain syndrome   2. Bilateral carpal tunnel syndrome   3. Controlled substance agreement signed   4. 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 Medullary cystic disease of the kidney   6. Primary osteoarthritis of both knees   7. History of bilateral knee replacement        Plan of Care   Mr. GUERIN LASHOMB has a current medication list which includes the following long-term medication(s): amlodipine, amlodipine, atorvastatin, colchicine, furosemide, losartan, [START ON 07/11/2021] oxycodone-acetaminophen, [START ON 08/10/2021] oxycodone-acetaminophen, and [START ON  09/09/2021] oxycodone-acetaminophen.  -Discontinue morphine  Pharmacotherapy (Medications Ordered): Meds ordered this encounter  Medications   oxyCODONE-acetaminophen (PERCOCET) 10-325 MG tablet    Sig: Take 1 tablet by mouth every 6 (six) hours as needed for pain. Must last 30 days.    Dispense:  120 tablet    Refill:  0     [place] STOP ACT - Not applicable. Fill one day early if pharmacy is closed on scheduled refill date.   oxyCODONE-acetaminophen (PERCOCET) 10-325 MG tablet    Sig: Take 1 tablet by mouth every 6 (six) hours as needed for pain. Must last 30 days.    Dispense:  120 tablet    Refill:  0    [place] STOP ACT - Not applicable. Fill one day early if pharmacy is closed on scheduled refill date.   oxyCODONE-acetaminophen (PERCOCET) 10-325 MG tablet    Sig: Take 1 tablet by mouth every 6 (six) hours as needed for pain. Must last 30 days.    Dispense:  120 tablet    Refill:  0     STOP ACT - Not applicable. Fill one day early if pharmacy is closed on scheduled refill date.   Discussed central sensitization, risks of chronic opioid therapy, opioid-induced hyperalgesia as well as tolerance phenomenon.  Pain neuroscience education provided.  Follow-up plan:   Return in about 3 months (around 10/05/2021) for Medication Management, in person.   Recent Visits Date Type Provider Dept  06/07/21 Telemedicine Gillis Santa, MD [facility]  Showing recent visits within past 90 days and meeting all other requirements Today's Visits Date Type Provider Dept  07/05/21 Office Visit Gillis Santa, MD [facility]  Showing today's visits and meeting all other requirements Future Appointments Date Type Provider Dept  09/27/21 Appointment Gillis Santa, MD [facility]  Showing future appointments within next 90 days and meeting all other requirements I discussed the assessment and treatment plan with the patient. The patient was provided an opportunity to ask questions and all were answered. The patient agreed with the plan and demonstrated an understanding of the instructions.  Patient advised to call back or seek an in-person evaluation if the symptoms or condition worsens.  Duration of encounter: 30 minutes.  Note by: Gillis Santa, MD Date: 07/05/2021; Time: 11:04 AM

## 2021-09-27 ENCOUNTER — Ambulatory Visit
Payer: Medicare Other | Attending: Student in an Organized Health Care Education/Training Program | Admitting: Student in an Organized Health Care Education/Training Program

## 2021-09-27 ENCOUNTER — Encounter: Payer: Self-pay | Admitting: Student in an Organized Health Care Education/Training Program

## 2021-09-27 ENCOUNTER — Other Ambulatory Visit: Payer: Self-pay

## 2021-09-27 ENCOUNTER — Telehealth: Payer: Self-pay

## 2021-09-27 DIAGNOSIS — G5603 Carpal tunnel syndrome, bilateral upper limbs: Secondary | ICD-10-CM

## 2021-09-27 DIAGNOSIS — Z79899 Other long term (current) drug therapy: Secondary | ICD-10-CM

## 2021-09-27 DIAGNOSIS — G894 Chronic pain syndrome: Secondary | ICD-10-CM

## 2021-09-27 DIAGNOSIS — M79641 Pain in right hand: Secondary | ICD-10-CM

## 2021-09-27 DIAGNOSIS — M1A39X1 Chronic gout due to renal impairment, multiple sites, with tophus (tophi): Secondary | ICD-10-CM | POA: Diagnosis not present

## 2021-09-27 DIAGNOSIS — Q615 Medullary cystic kidney: Secondary | ICD-10-CM

## 2021-09-27 DIAGNOSIS — M170 Bilateral primary osteoarthritis of knee: Secondary | ICD-10-CM

## 2021-09-27 MED ORDER — OXYCODONE-ACETAMINOPHEN 10-325 MG PO TABS: 1.0000 | ORAL_TABLET | Freq: Four times a day (QID) | ORAL | 0 refills | Status: DC | PRN

## 2021-09-27 NOTE — Progress Notes (Signed)
Patient: Nathan Ramos  Service Category: E/M  Provider: Gillis Santa, MD  DOB: 05-07-1971  DOS: 09/27/2021  Location: Office  MRN: [id number]  Setting: Ambulatory outpatient  Referring Provider: Kingsley*  Type: Established Patient  Specialty: Interventional Pain Management  PCP: [facility]  Location: Remote location  Delivery: TeleHealth     Virtual Encounter - Pain Management PROVIDER NOTE: Information contained herein reflects review and annotations entered in association with encounter. Interpretation of such information and data should be left to medically-trained personnel. Information provided to patient can be located elsewhere in the medical record under "Patient Instructions". Document created using STT-dictation technology, any transcriptional errors that may result from process are unintentional.    Contact & Pharmacy Preferred: [phone] Home: [phone] (home) Mobile: [phone] (mobile) E-mail: [email or web address]  [facility] [address], [address] AT [address] [place] [address] Phone: [phone] Fax: [phone]   Pre-screening  Nathan Ramos offered "in-person" vs "virtual" encounter. Nathan Ramos indicated preferring virtual for this encounter.   Reason COVID-19*  Social distancing based on CDC and AMA recommendations.   I contacted Nathan Ramos on 09/27/2021 via telephone.      I clearly identified myself as Gillis Santa, MD. I verified that I was speaking with the correct person using two identifiers (Name: Nathan Ramos, and date of birth: 08/11/1971).  Consent I sought verbal advanced consent from Nathan Ramos for virtual visit interactions. I informed Nathan Ramos of possible security and privacy concerns, risks, and limitations associated with providing "not-in-person" medical evaluation and management services. I also informed Nathan Ramos of the availability of  "in-person" appointments. Finally, I informed him that there would be a charge for the virtual visit and that Nathan Ramos could be  personally, fully or partially, financially responsible for it. Nathan Ramos expressed understanding and agreed to proceed.   Historic Elements   Nathan Ramos is a 50 y.o. year old, male patient evaluated today after our last contact on 07/05/2021. Nathan Ramos  has a past medical history of Anemia, Bone spur of other site, Chronic pain, CKD (chronic kidney disease), Controlled substance agreement signed, Gout, Gouty arthritis, History of YAG laser capsulotomy of lens of left eye, Hyperlipidemia, Hypertension, IFG (impaired fasting glucose), Left knee DJD, Medullary cystic disease of the kidney, Primary localized osteoarthritis of right knee, and Smoker. 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 Nathan Ramos has a current medication list which includes the following prescription(s): amlodipine, amlodipine, atorvastatin, colchicine, febuxostat, furosemide, losartan, naloxone, and [START ON 10/09/2021] oxycodone-acetaminophen. Nathan Ramos  reports that Nathan Ramos has been smoking cigarettes. Nathan Ramos has a 11.00 pack-year smoking history. Nathan Ramos has never used smokeless tobacco. Nathan Ramos reports that Nathan Ramos does not drink alcohol and does not use drugs. Nathan Ramos is allergic to nsaids.   HPI  Today, Nathan Ramos is being contacted for medication management.  Patient is experiencing flulike symptoms.  Nathan Ramos states that his daughter is positive for the flu.  Hence we are dealing a virtual visit for medication refill.  At his last visit, we discontinued morphine and increase his Percocet to 10 mg every  6 hours as needed.  Nathan Ramos states that Nathan Ramos is doing better on this regimen.  At his equivalent in MME to his previous regimen so no dose escalation with current regimen.  Prescription refill for 1 month.  Pharmacotherapy Assessment   Analgesic: Percocet 10 mg every 6 hours as needed, quantity 120/month; MME equals 60  Monitoring: [place] PMP: PDMP reviewed during this encounter.       Pharmacotherapy: No side-effects or adverse reactions reported. Compliance: No problems identified. Effectiveness: Clinically acceptable. Plan: Refer to "POC". UDS:  Summary  Date Value Ref Range Status  03/08/2021 Note  Final    Comment:    ==================================================================== [employer] Select 13 (MW) ==================================================================== Test                             Result       Flag       Units  Drug Present and Declared for Prescription Verification   Morphine                       2628         EXPECTED   ng/mg creat   Normorphine                    128          EXPECTED   ng/mg creat    Potential sources of morphine include administration of codeine or    morphine, use of heroin, or ingestion of poppy seeds.     Normorphine is an expected metabolite of morphine.    Oxycodone                      1565         EXPECTED   ng/mg creat   Oxymorphone                    3828         EXPECTED   ng/mg creat   Noroxycodone                   1688         EXPECTED   ng/mg creat   Noroxymorphone                 623          EXPECTED   ng/mg creat    Sources of oxycodone are scheduled prescription medications.    Oxymorphone, noroxycodone, and noroxymorphone are expected    metabolites of oxycodone. Oxymorphone is also available as a    scheduled prescription medication.  ==================================================================== Test                      Result    Flag   Units      Ref Range   Creatinine              40                mg/dL      >=20 ==================================================================== Declared Medications:  The flagging and interpretation on this report are based on the  following declared medications.  Unexpected results may arise from  inaccuracies in the declared medications.   **Note: The testing scope of this panel includes these medications:   Morphine (MS Contin)  Oxycodone (Percocet)   **Note: The testing scope of this panel does not include the  following  reported medications:   Acetaminophen (Percocet)  Amlodipine (Norvasc)  Atorvastatin (Lipitor)  Colchicine  Febuxostat  Furosemide (Lasix)  Losartan (Cozaar)  Naloxone (Narcan) ==================================================================== For clinical consultation, please call [phone]. ====================================================================      Laboratory Chemistry Profile   Renal Lab Results  Component Value Date   BUN 47 (H) 06/01/2020   CREATININE 3.06 (H) 06/01/2020   BCR 15 06/01/2020   GFRAA 27 (L) 06/01/2020   GFRNONAA 23 (L) 06/01/2020    Hepatic Lab Results  Component Value Date   AST 15 06/01/2020   ALT 14 10/11/2019   ALBUMIN 4.6 06/01/2020   ALKPHOS 103 06/01/2020    Electrolytes Lab Results  Component Value Date   NA 138 06/01/2020   K 5.7 (H) 06/01/2020   CL 101 06/01/2020   CALCIUM 10.1 06/01/2020    Bone No results found for: VD25OH, VD125OH2TOT, QQ2297LG9, QJ1941DE0, 25OHVITD1, 25OHVITD2, 25OHVITD3, TESTOFREE, TESTOSTERONE  Inflammation (CRP: Acute Phase) (ESR: Chronic Phase) No results found for: CRP, ESRSEDRATE, LATICACIDVEN       Note: Above Lab results reviewed.  Imaging  CT HAND RIGHT WO CONTRAST CLINICAL DATA:  Right hand pain, swelling and limited range of motion.  EXAM: CT OF THE RIGHT HAND WITHOUT CONTRAST  TECHNIQUE: Multidetector CT imaging of the right hand was performed according to the standard protocol. Multiplanar CT image  reconstructions were also generated.  COMPARISON:  Radiographs 07/03/2018  FINDINGS: Significant age advanced degenerative changes involving the hands and wrists. Moderate interphalangeal joint space narrowing and early spurring.  The MCP joints demonstrate scattered erosions and soft tissue calcifications. There is a sizable erosion in the second metacarpal head away from the joint. There is also significant calcific tendinitis involving the flexor tendons of the fingers.  Advanced degenerative changes at the wrist with carpal crowding and erosive changes. Chondrocalcinosis is also noted and there is calcification in the extensor tendons and flexor tendons at the wrist.  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  No acute bony findings or destructive bony changes.  IMPRESSION: 1. Significant age advanced degenerative changes involving the hands and wrists as described above. 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 2. No acute bony findings or destructive bony changes. 3. Significant and fairly diffuse calcific tendinitis.  Electronically Signed   By: Marijo Sanes M.D.   On: 09/14/2020 13:54  Assessment  The primary encounter diagnosis was Chronic pain syndrome. 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  Plan of Care    Mr. GJON Ramos has a current medication list which includes the following long-term medication(s): amlodipine, amlodipine, atorvastatin, colchicine, furosemide, losartan, and [START ON 10/09/2021] oxycodone-acetaminophen.  Pharmacotherapy (Medications Ordered): Meds ordered this encounter  Medications   oxyCODONE-acetaminophen (PERCOCET) 10-325 MG tablet    Sig: Take 1 tablet by mouth  every 6 (six) hours as needed for pain. Must last 30 days.    Dispense:  120 tablet    Refill:  0    [place] STOP ACT - Not applicable. Fill one day early if pharmacy is closed on scheduled refill date.     Follow-up plan:   Return in about 5 weeks (around 11/03/2021) for Medication Management, in person.    Recent Visits Date Type Provider Dept  07/05/21 Office Visit Gillis Santa,  MD [facility]  Showing recent visits within past 90 days and meeting all other requirements Today's Visits Date Type Provider Dept  09/27/21 Office Visit Gillis Santa, MD [facility]  Showing today's visits and meeting all other requirements Future Appointments No visits were found meeting these conditions. Showing future appointments within next 90 days and meeting all other requirements I discussed the assessment and treatment plan with the patient. The patient was provided an opportunity to ask questions and all were answered. The patient agreed with the plan and demonstrated an understanding of the instructions.  Patient advised to call back or seek an in-person evaluation if the symptoms or condition worsens.  Duration of encounter: 70mnutes.  Note by: BGillis Santa MD Date: 09/27/2021; Time: 11:10 AM

## 2021-09-27 NOTE — Telephone Encounter (Signed)
Tried calling patient to make next appt. No answer and the mailbox is full.

## 2021-10-07 ENCOUNTER — Other Ambulatory Visit: Payer: Self-pay

## 2021-10-07 ENCOUNTER — Emergency Department: Payer: No Typology Code available for payment source

## 2021-10-07 DIAGNOSIS — S52572A Other intraarticular fracture of lower end of left radius, initial encounter for closed fracture: Secondary | ICD-10-CM | POA: Insufficient documentation

## 2021-10-07 DIAGNOSIS — S62514A Nondisplaced fracture of proximal phalanx of right thumb, initial encounter for closed fracture: Secondary | ICD-10-CM | POA: Diagnosis not present

## 2021-10-07 DIAGNOSIS — Z79899 Other long term (current) drug therapy: Secondary | ICD-10-CM | POA: Diagnosis not present

## 2021-10-07 DIAGNOSIS — S52615A Nondisplaced fracture of left ulna styloid process, initial encounter for closed fracture: Secondary | ICD-10-CM | POA: Diagnosis not present

## 2021-10-07 DIAGNOSIS — S0990XA Unspecified injury of head, initial encounter: Secondary | ICD-10-CM | POA: Insufficient documentation

## 2021-10-07 DIAGNOSIS — F17210 Nicotine dependence, cigarettes, uncomplicated: Secondary | ICD-10-CM | POA: Insufficient documentation

## 2021-10-07 DIAGNOSIS — S92014A Nondisplaced fracture of body of right calcaneus, initial encounter for closed fracture: Secondary | ICD-10-CM | POA: Insufficient documentation

## 2021-10-07 DIAGNOSIS — S8261XA Displaced fracture of lateral malleolus of right fibula, initial encounter for closed fracture: Secondary | ICD-10-CM | POA: Diagnosis not present

## 2021-10-07 DIAGNOSIS — I129 Hypertensive chronic kidney disease with stage 1 through stage 4 chronic kidney disease, or unspecified chronic kidney disease: Secondary | ICD-10-CM | POA: Insufficient documentation

## 2021-10-07 DIAGNOSIS — S3991XA Unspecified injury of abdomen, initial encounter: Secondary | ICD-10-CM | POA: Diagnosis not present

## 2021-10-07 DIAGNOSIS — Y92410 Unspecified street and highway as the place of occurrence of the external cause: Secondary | ICD-10-CM | POA: Insufficient documentation

## 2021-10-07 DIAGNOSIS — S6991XA Unspecified injury of right wrist, hand and finger(s), initial encounter: Secondary | ICD-10-CM | POA: Diagnosis present

## 2021-10-07 DIAGNOSIS — Z96653 Presence of artificial knee joint, bilateral: Secondary | ICD-10-CM | POA: Insufficient documentation

## 2021-10-07 DIAGNOSIS — S299XXA Unspecified injury of thorax, initial encounter: Secondary | ICD-10-CM | POA: Diagnosis not present

## 2021-10-07 DIAGNOSIS — N183 Chronic kidney disease, stage 3 unspecified: Secondary | ICD-10-CM | POA: Diagnosis not present
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
## 2021-10-08 ENCOUNTER — Emergency Department: Payer: No Typology Code available for payment source

## 2021-10-08 ENCOUNTER — Emergency Department
Admission: EM | Admit: 2021-10-08 | Discharge: 2021-10-08 | Disposition: A | Discharge status: Home / Self Care | Payer: No Typology Code available for payment source | Attending: Emergency Medicine | Admitting: Emergency Medicine

## 2021-10-08 DIAGNOSIS — S62514A Nondisplaced fracture of proximal phalanx of right thumb, initial encounter for closed fracture: Secondary | ICD-10-CM | POA: Diagnosis not present

## 2021-10-08 DIAGNOSIS — S82831A Other fracture of upper and lower end of right fibula, initial encounter for closed fracture: Secondary | ICD-10-CM

## 2021-10-08 DIAGNOSIS — T148XXA Other injury of unspecified body region, initial encounter: Secondary | ICD-10-CM

## 2021-10-08 DIAGNOSIS — S92001A Unspecified fracture of right calcaneus, initial encounter for closed fracture: Secondary | ICD-10-CM

## 2021-10-08 DIAGNOSIS — S52502A Unspecified fracture of the lower end of left radius, initial encounter for closed fracture: Secondary | ICD-10-CM

## 2021-10-08 MED ORDER — OXYCODONE-ACETAMINOPHEN 5-325 MG PO TABS
2.0000 | ORAL_TABLET | Freq: Once | ORAL | Status: AC
  Administered 2021-10-08: 2 via ORAL
  Filled 2021-10-08: qty 2

## 2021-10-08 MED ORDER — COLCHICINE 0.6 MG PO TABS: 0.6000 mg | ORAL_TABLET | Freq: Every day | ORAL | 1 refills | Status: DC

## 2021-10-08 MED ORDER — IOHEXOL 300 MG/ML  SOLN
100.0000 mL | Freq: Once | INTRAMUSCULAR | Status: AC | PRN
  Administered 2021-10-08: 75 mL via INTRAVENOUS

## 2021-10-08 MED ORDER — AMLODIPINE BESYLATE 5 MG PO TABS
10.0000 mg | ORAL_TABLET | Freq: Once | ORAL | Status: AC
  Administered 2021-10-08: 10 mg via ORAL
  Filled 2021-10-08: qty 2

## 2021-10-08 MED ORDER — FUROSEMIDE 40 MG PO TABS
40.0000 mg | ORAL_TABLET | Freq: Once | ORAL | Status: AC
  Administered 2021-10-08: 40 mg via ORAL
  Filled 2021-10-08: qty 1

## 2021-10-08 MED ORDER — ONDANSETRON 4 MG PO TBDP
4.0000 mg | ORAL_TABLET | Freq: Once | ORAL | Status: AC
  Administered 2021-10-08: 4 mg via ORAL
  Filled 2021-10-08: qty 1

## 2021-10-08 MED ORDER — COLCHICINE 0.6 MG PO TABS
1.2000 mg | ORAL_TABLET | Freq: Once | ORAL | Status: AC
  Administered 2021-10-08: 1.2 mg via ORAL
  Filled 2021-10-08: qty 2

## 2021-10-08 MED ORDER — ATORVASTATIN CALCIUM 20 MG PO TABS
40.0000 mg | ORAL_TABLET | Freq: Once | ORAL | Status: AC
  Administered 2021-10-08: 40 mg via ORAL
  Filled 2021-10-08: qty 2

## 2021-10-08 MED ORDER — LOSARTAN POTASSIUM 50 MG PO TABS
50.0000 mg | ORAL_TABLET | Freq: Once | ORAL | Status: AC
Start: 2021-10-08 — End: 2021-10-08
  Administered 2021-10-08: 50 mg via ORAL
  Filled 2021-10-08: qty 1

## 2021-10-08 NOTE — Discharge Instructions (Addendum)
I recommend that you are nonweightbearing with your right leg and left arm.  Please take your pain medication as prescribed.  Please keep your splint on and clean and dry at all times.  Please call to schedule close follow-up with orthopedics.
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
## 2021-10-08 NOTE — ED Notes (Signed)
Pt verbalizes understanding of d/c instructions, medications and follow up 

## 2021-10-10 ENCOUNTER — Telehealth: Payer: Self-pay | Admitting: Student in an Organized Health Care Education/Training Program

## 2021-10-10 ENCOUNTER — Other Ambulatory Visit: Payer: Self-pay | Admitting: Orthopedic Surgery

## 2021-10-10 DIAGNOSIS — M25532 Pain in left wrist: Secondary | ICD-10-CM
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
## 2021-10-21 ENCOUNTER — Ambulatory Visit
Admission: RE | Admit: 2021-10-21 | Discharge: 2021-10-21 | Disposition: A | Discharge status: Home / Self Care | Payer: Medicare Other | Source: Ambulatory Visit | Attending: Orthopedic Surgery | Admitting: Orthopedic Surgery

## 2021-10-21 ENCOUNTER — Other Ambulatory Visit: Payer: Self-pay

## 2021-10-21 ENCOUNTER — Other Ambulatory Visit: Payer: Medicare Other

## 2021-10-21 DIAGNOSIS — M25532 Pain in left wrist: Secondary | ICD-10-CM

## 2021-11-01 ENCOUNTER — Encounter: Payer: Self-pay | Admitting: Student in an Organized Health Care Education/Training Program

## 2021-11-01 ENCOUNTER — Ambulatory Visit
Payer: Medicare Other | Attending: Student in an Organized Health Care Education/Training Program | Admitting: Student in an Organized Health Care Education/Training Program

## 2021-11-01 ENCOUNTER — Other Ambulatory Visit: Payer: Self-pay

## 2021-11-01 VITALS — BP 151/101 | HR 105 | Temp 98.1°F | Resp 18 | Ht 69.0 in | Wt 250.0 lb

## 2021-11-01 DIAGNOSIS — M170 Bilateral primary osteoarthritis of knee: Secondary | ICD-10-CM | POA: Insufficient documentation

## 2021-11-01 DIAGNOSIS — M1A39X1 Chronic gout due to renal impairment, multiple sites, with tophus (tophi): Secondary | ICD-10-CM

## 2021-11-01 DIAGNOSIS — Q615 Medullary cystic kidney: Secondary | ICD-10-CM | POA: Diagnosis present

## 2021-11-01 DIAGNOSIS — G5603 Carpal tunnel syndrome, bilateral upper limbs: Secondary | ICD-10-CM | POA: Diagnosis present

## 2021-11-01 DIAGNOSIS — Z79899 Other long term (current) drug therapy: Secondary | ICD-10-CM | POA: Insufficient documentation

## 2021-11-01 DIAGNOSIS — M79641 Pain in right hand: Secondary | ICD-10-CM | POA: Diagnosis present

## 2021-11-01 DIAGNOSIS — G894 Chronic pain syndrome: Secondary | ICD-10-CM | POA: Diagnosis present

## 2021-11-01 MED ORDER — OXYCODONE-ACETAMINOPHEN 10-325 MG PO TABS: 1.0000 | ORAL_TABLET | Freq: Four times a day (QID) | ORAL | 0 refills | Status: DC | PRN

## 2021-11-01 NOTE — Progress Notes (Signed)
Nursing Pain Medication Assessment:  Safety precautions to be maintained throughout the outpatient stay will include: orient to surroundings, keep bed in low position, maintain call bell within reach at all times, provide assistance with transfer out of bed and ambulation.  Medication Inspection Compliance: Pill count conducted under aseptic conditions, in front of the patient. Neither the pills nor the bottle was removed from the patient's sight at any time. Once count was completed pills were immediately returned to the patient in their original bottle.  Medication: Oxycodone/APAP Pill/Patch Count:  26 of 120 pills remain Pill/Patch Appearance: Markings consistent with prescribed medication Bottle Appearance: Standard pharmacy container. Clearly labeled. Filled Date: 75 / 27 / 2022 Last Medication intake:  Today

## 2021-11-01 NOTE — Progress Notes (Deleted)
Safety precautions to be maintained throughout the outpatient stay will include: orient to surroundings, keep bed in low position, maintain call bell within reach at all times, provide assistance with transfer out of bed and ambulation.  
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
## 2021-11-03 ENCOUNTER — Other Ambulatory Visit: Payer: Self-pay | Admitting: Orthopedic Surgery

## 2021-11-03 DIAGNOSIS — S92001A Unspecified fracture of right calcaneus, initial encounter for closed fracture: Secondary | ICD-10-CM

## 2021-11-07 ENCOUNTER — Encounter (HOSPITAL_COMMUNITY): Payer: Self-pay | Admitting: Radiology

## 2021-11-09 ENCOUNTER — Inpatient Hospital Stay: Admission: RE | Admit: 2021-11-09 | Payer: Medicare Other | Source: Ambulatory Visit

## 2021-11-09 ENCOUNTER — Other Ambulatory Visit: Payer: Medicare Other

## 2021-11-10 ENCOUNTER — Other Ambulatory Visit: Payer: Medicare Other

## 2021-11-17 ENCOUNTER — Ambulatory Visit
Admission: RE | Admit: 2021-11-17 | Discharge: 2021-11-17 | Disposition: A | Discharge status: Home / Self Care | Payer: Medicare Other | Source: Ambulatory Visit | Attending: Orthopedic Surgery | Admitting: Orthopedic Surgery

## 2021-11-17 ENCOUNTER — Other Ambulatory Visit: Payer: Self-pay | Admitting: Orthopedic Surgery

## 2021-11-17 DIAGNOSIS — S92001A Unspecified fracture of right calcaneus, initial encounter for closed fracture: Secondary | ICD-10-CM

## 2022-01-25 IMAGING — CT CT HAND*R* W/O CM
2 of 3 series · 9 of 34 positions shown, 11 images · non-contrast
Comparison: Radiographs 07/03/2018

CLINICAL DATA: Right hand pain, swelling and limited range of
motion.

EXAM:
CT OF THE RIGHT HAND WITHOUT CONTRAST
TECHNIQUE: Multidetector CT imaging of the right hand was performed according
to the standard protocol. Multiplanar CT image reconstructions were
also generated.

[Series 7: axial st · axial · 0.16mm/px · z∈[+65,+203]mm · 6 of 215 slices shown, 8 images]
[im 33/215  soft-tissue]
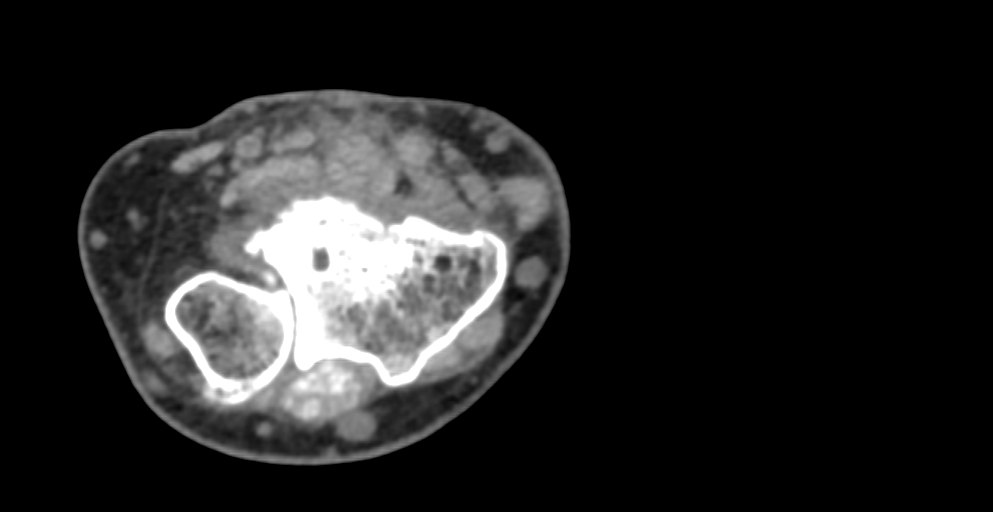
[im 33/215  bone]
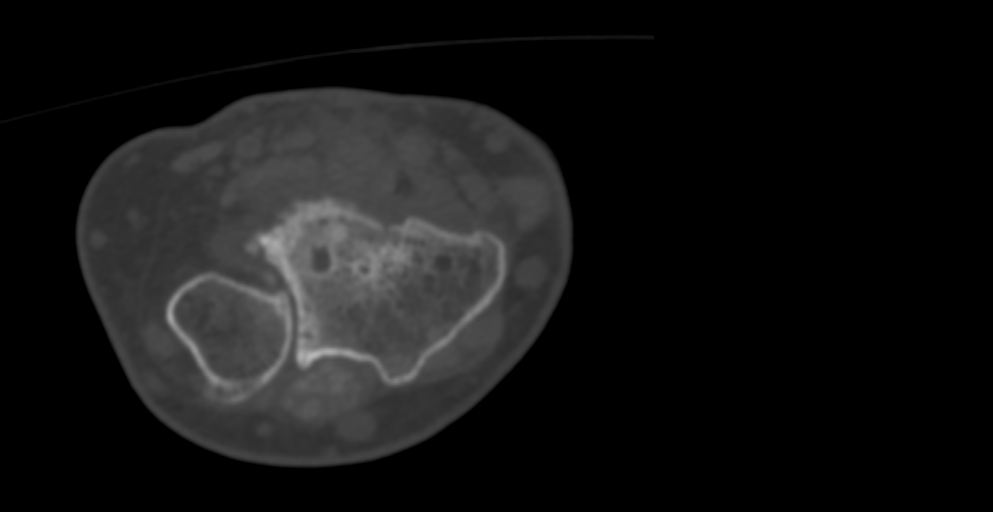
[im 66/215  bone]
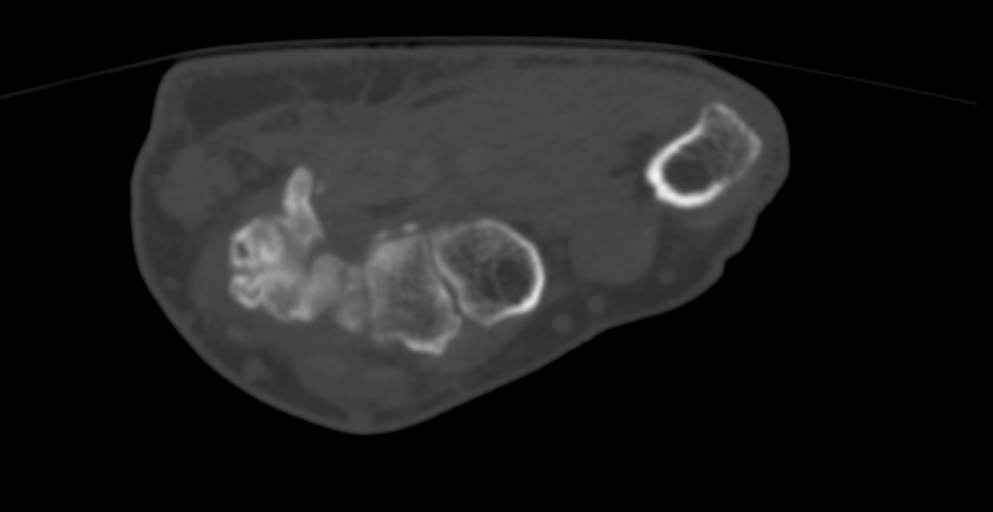
[im 99/215  bone]
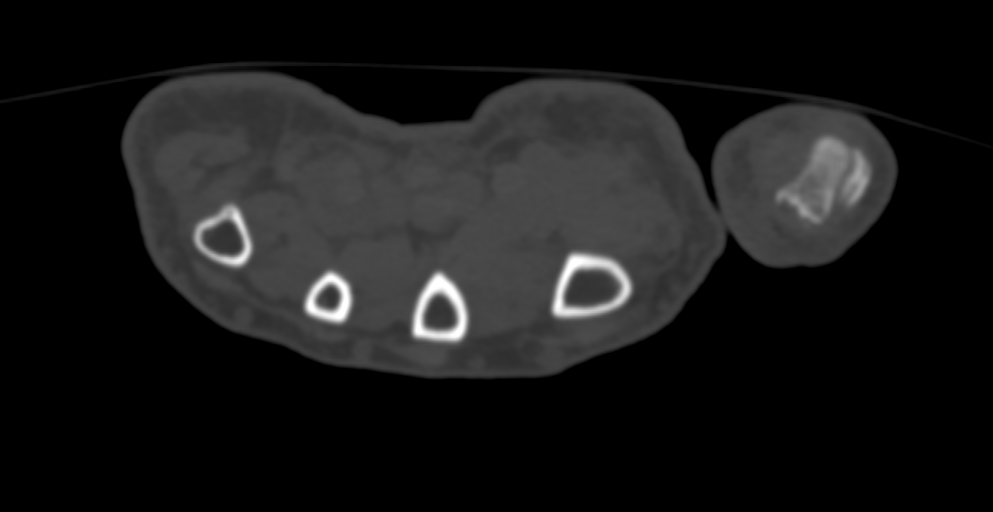
[im 132/215  bone]
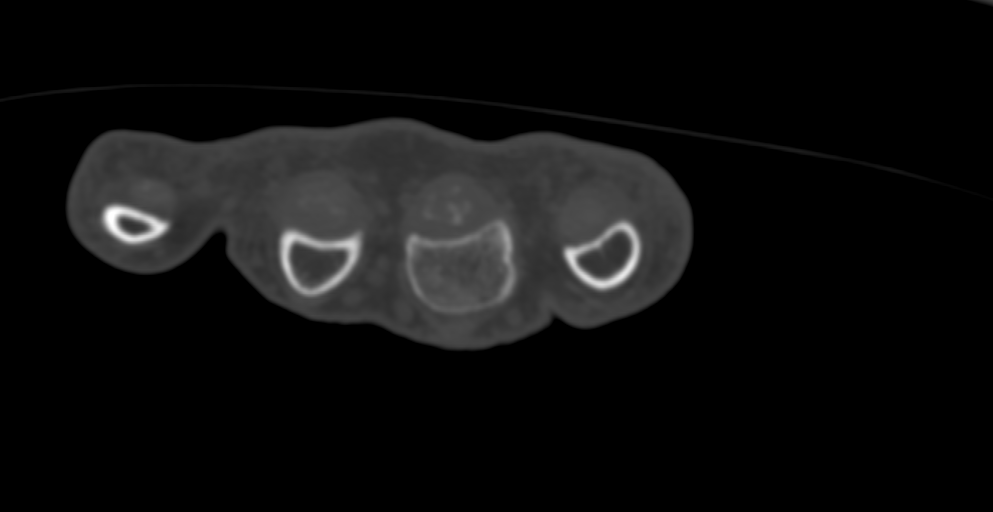
[im 165/215  soft-tissue]
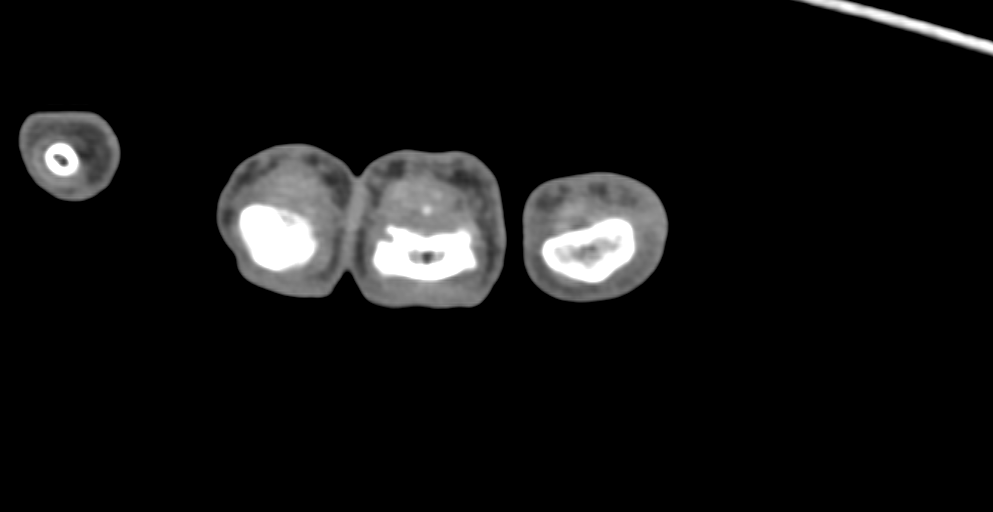
[im 165/215  bone]
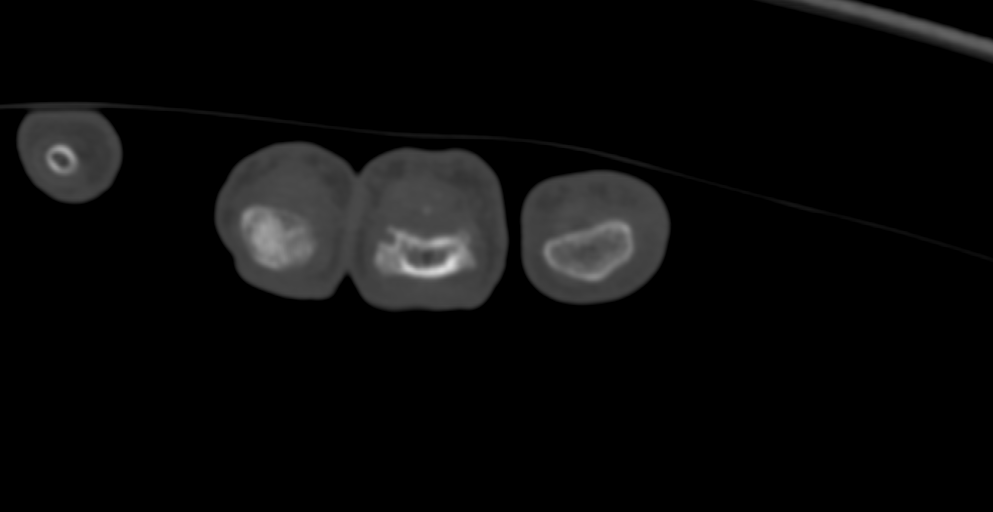
[im 198/215  bone]
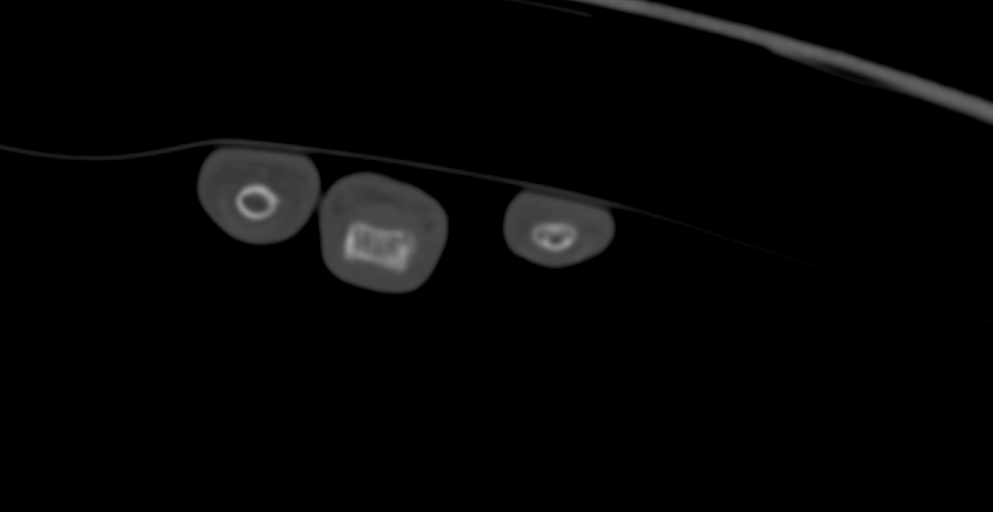

[Series 10: cor st · coronal · 0.31mm/px · 3 of 79 slices shown]
[im 16/79  bone]
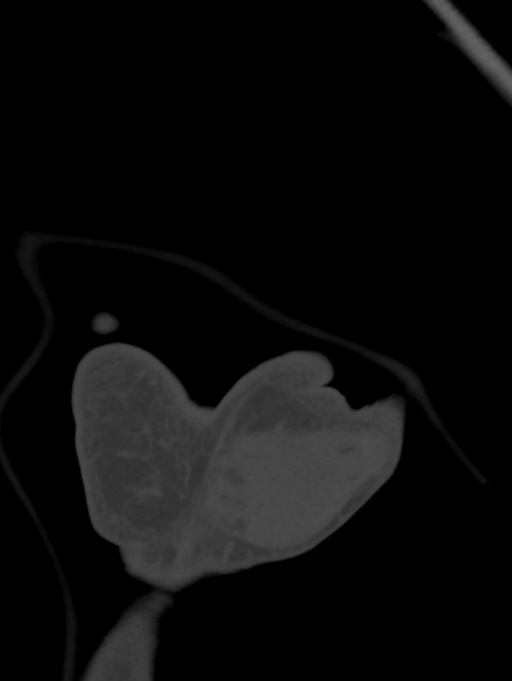
[im 32/79  bone]
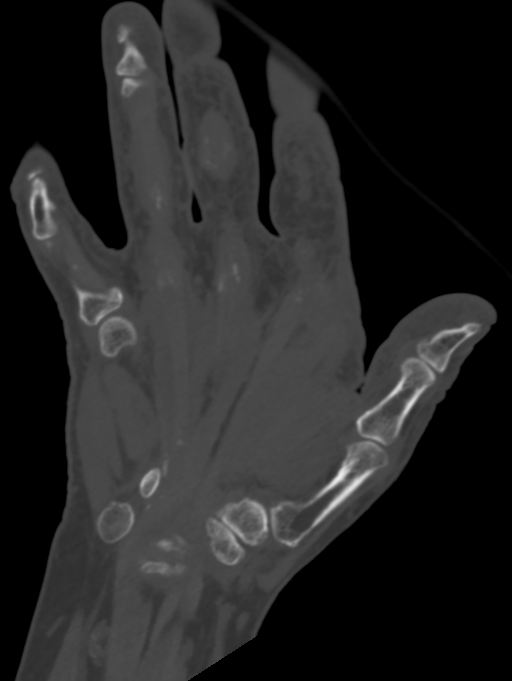
[im 47/79  bone]
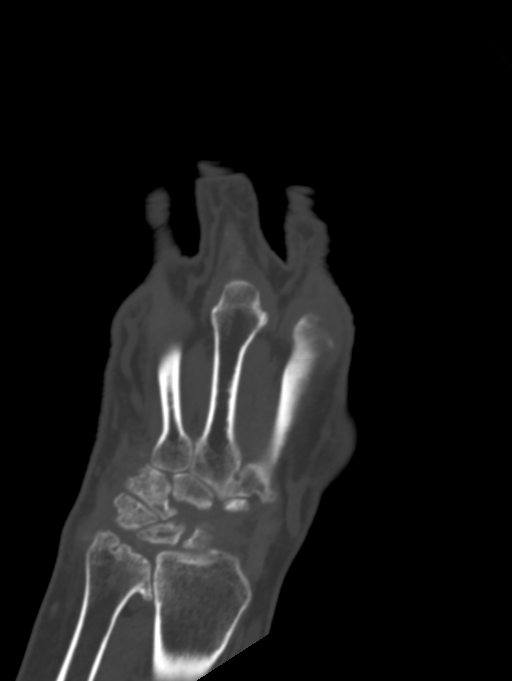

[9 of 34 positions shown; findings below may reference images not displayed]

FINDINGS: Significant age advanced degenerative changes involving the hands
and wrists. Moderate interphalangeal joint space narrowing and early
spurring.

The MCP joints demonstrate scattered erosions and soft tissue
calcifications. There is a sizable erosion in the second metacarpal
head away from the joint. There is also significant calcific
tendinitis involving the flexor tendons of the fingers.

Advanced degenerative changes at the wrist with carpal crowding and
erosive changes. Chondrocalcinosis is also noted and there is
calcification in the extensor tendons and flexor tendons at the
wrist.

Findings could be due to CPPD arthropathy or gout.

No acute bony findings or destructive bony changes.
IMPRESSION: 1. Significant age advanced degenerative changes involving the hands
and wrists as described above. Findings could be due to CPPD
arthropathy or gout.
2. No acute bony findings or destructive bony changes.
3. Significant and fairly diffuse calcific tendinitis.

## 2022-01-26 ENCOUNTER — Encounter: Payer: Self-pay | Admitting: Student in an Organized Health Care Education/Training Program

## 2022-01-26 ENCOUNTER — Other Ambulatory Visit: Payer: Self-pay

## 2022-01-26 ENCOUNTER — Ambulatory Visit
Payer: Medicare Other | Attending: Student in an Organized Health Care Education/Training Program | Admitting: Student in an Organized Health Care Education/Training Program

## 2022-01-26 VITALS — BP 148/103 | HR 93 | Temp 96.9°F | Resp 18 | Ht 69.0 in | Wt 248.0 lb

## 2022-01-26 DIAGNOSIS — Z96653 Presence of artificial knee joint, bilateral: Secondary | ICD-10-CM | POA: Insufficient documentation

## 2022-01-26 DIAGNOSIS — M170 Bilateral primary osteoarthritis of knee: Secondary | ICD-10-CM | POA: Insufficient documentation

## 2022-01-26 DIAGNOSIS — M79641 Pain in right hand: Secondary | ICD-10-CM | POA: Diagnosis present

## 2022-01-26 DIAGNOSIS — G894 Chronic pain syndrome: Secondary | ICD-10-CM | POA: Diagnosis present

## 2022-01-26 DIAGNOSIS — G5603 Carpal tunnel syndrome, bilateral upper limbs: Secondary | ICD-10-CM | POA: Insufficient documentation

## 2022-01-26 DIAGNOSIS — M1A39X1 Chronic gout due to renal impairment, multiple sites, with tophus (tophi): Secondary | ICD-10-CM | POA: Diagnosis present

## 2022-01-26 DIAGNOSIS — Z79899 Other long term (current) drug therapy: Secondary | ICD-10-CM | POA: Insufficient documentation

## 2022-01-26 DIAGNOSIS — Q615 Medullary cystic kidney: Secondary | ICD-10-CM | POA: Insufficient documentation

## 2022-01-26 MED ORDER — OXYCODONE-ACETAMINOPHEN 10-325 MG PO TABS: 1.0000 | ORAL_TABLET | Freq: Four times a day (QID) | ORAL | 0 refills | Status: DC | PRN

## 2022-01-26 NOTE — Progress Notes (Signed)
Nursing Pain Medication Assessment:  ?Safety precautions to be maintained throughout the outpatient stay will include: orient to surroundings, keep bed in low position, maintain call bell within reach at all times, provide assistance with transfer out of bed and ambulation.  ?Medication Inspection Compliance: Pill count conducted under aseptic conditions, in front of the patient. Neither the pills nor the bottle was removed from the patient's sight at any time. Once count was completed pills were immediately returned to the patient in their original bottle. ? ?Medication: Oxycodone/APAP ?Pill/Patch Count:  39 of 120 pills remain ?Pill/Patch Appearance: Markings consistent with prescribed medication ?Bottle Appearance: Standard pharmacy container. Clearly labeled. ?Filled Date: 02 / 24 / 2023 ?Last Medication intake:  Today ?
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
## 2022-02-02 LAB — TOXASSURE SELECT 13 (MW), URINE

## 2022-04-13 ENCOUNTER — Encounter: Payer: Self-pay | Admitting: Student in an Organized Health Care Education/Training Program

## 2022-04-13 ENCOUNTER — Ambulatory Visit
Payer: Medicare Other | Attending: Student in an Organized Health Care Education/Training Program | Admitting: Student in an Organized Health Care Education/Training Program

## 2022-04-13 VITALS — BP 154/101 | Temp 97.4°F | Ht 69.0 in | Wt 248.0 lb

## 2022-04-13 DIAGNOSIS — M1A39X1 Chronic gout due to renal impairment, multiple sites, with tophus (tophi): Secondary | ICD-10-CM | POA: Diagnosis present

## 2022-04-13 DIAGNOSIS — G5603 Carpal tunnel syndrome, bilateral upper limbs: Secondary | ICD-10-CM | POA: Diagnosis present

## 2022-04-13 DIAGNOSIS — Q615 Medullary cystic kidney: Secondary | ICD-10-CM | POA: Diagnosis present

## 2022-04-13 DIAGNOSIS — G894 Chronic pain syndrome: Secondary | ICD-10-CM | POA: Insufficient documentation

## 2022-04-13 DIAGNOSIS — M79641 Pain in right hand: Secondary | ICD-10-CM | POA: Diagnosis present

## 2022-04-13 DIAGNOSIS — Z96653 Presence of artificial knee joint, bilateral: Secondary | ICD-10-CM | POA: Insufficient documentation

## 2022-04-13 DIAGNOSIS — M170 Bilateral primary osteoarthritis of knee: Secondary | ICD-10-CM | POA: Diagnosis present

## 2022-04-13 DIAGNOSIS — Z79899 Other long term (current) drug therapy: Secondary | ICD-10-CM | POA: Diagnosis present

## 2022-04-13 MED ORDER — OXYCODONE-ACETAMINOPHEN 10-325 MG PO TABS: 1.0000 | ORAL_TABLET | Freq: Four times a day (QID) | ORAL | 0 refills | Status: DC | PRN

## 2022-04-13 NOTE — Progress Notes (Signed)
PROVIDER NOTE: Information contained herein reflects review and annotations entered in association with encounter. Interpretation of such information and data should be left to medically-trained personnel. Information provided to patient can be located elsewhere in the medical record under "Patient Instructions". Document created using STT-dictation technology, any transcriptional errors that may result from process are unintentional.    Patient: Nathan Ramos  Service Category: E/M  Provider: Gillis Santa, MD  DOB: 1971-07-10  DOS: 04/13/2022  Specialty: Interventional Pain Management  MRN: [id number]  Setting: Ambulatory outpatient  [facility]P: [facility]  Type: Established Patient    Referring Provider: [facility]  Location: Office  Delivery: Face-to-face     HPI  Nathan Ramos, a 51 y.o. year old male, is here today because of his Chronic pain syndrome [G89.4]. Nathan Ramos primary complain today is Knee Pain (bilateral)  Last encounter: My last encounter with him was on 01/26/2022  Pertinent problems: Nathan Ramos has Medullary cystic disease of the kidney; Chronic pain; Gout due to renal impairment; Primary localized osteoarthritis of right knee; Chronic pain syndrome; and Opioid use on their pertinent problem list. Pain Assessment: Severity of Chronic pain is reported as a 6 /10. Location: Knee Right, Left/denies. Onset: More than a month ago. Quality: Discomfort. Timing: Intermittent. Modifying factor(s): meds. Vitals:  height is 5' 9"  (1.753 m) and weight is 248 lb (112.5 kg). His temperature is 97.4 F (36.3 C) (abnormal). His blood pressure is 154/101 (abnormal).   Reason for encounter: medication management.    No change in medical history since last visit.  Patient's pain is at baseline.  Patient continues multimodal pain regimen as prescribed.  States that it provides pain relief and improvement in functional status. Patient is switching his pharmacy as he  was [facility] problems with [facility].    Pharmacotherapy Assessment   04/06/2022  01/26/2022   1  Oxycodone-Acetaminophen 10-325 120.00  30  Bi Lat  [id number]   [facility] (5798)  0/0  60.00 MME  Medicare  [place]     Pharmacotherapy Assessment  Analgesic: Percocet 10 mg every 6 hours daily as needed, quantity 120/month, MME 60,    Monitoring: [place] PMP: PDMP reviewed during this encounter.       Pharmacotherapy: No side-effects or adverse reactions reported. Compliance: No problems identified. Effectiveness: Clinically acceptable.  UDS:  Summary  Date Value Ref Range Status  01/26/2022 Note  Final    Comment:    ==================================================================== ToxASSURE Select 13 (MW) ==================================================================== Test                             Result       Flag       Units  Drug Present and Declared for Prescription Verification   Oxycodone                      2177         EXPECTED   ng/mg creat   Oxymorphone                    6148         EXPECTED   ng/mg creat   Noroxycodone                   2054         EXPECTED   ng/mg creat   Noroxymorphone  1030         EXPECTED   ng/mg creat    Sources of oxycodone are scheduled prescription medications.    Oxymorphone, noroxycodone, and noroxymorphone are expected    metabolites of oxycodone. Oxymorphone is also available as a    scheduled prescription medication.  ==================================================================== Test                      Result    Flag   Units      Ref Range   Creatinine              81               mg/dL      >=20 ==================================================================== Declared Medications:  The flagging and interpretation on this report are based on the  following declared medications.  Unexpected results may arise from  inaccuracies in the declared medications.   **Note: The testing scope of this  panel includes these medications:   Oxycodone (Percocet)   **Note: The testing scope of this panel does not include the  following reported medications:   Acetaminophen (Percocet)  Amlodipine (Norvasc)  Atorvastatin (Lipitor)  Colchicine  Febuxostat  Furosemide (Lasix)  Losartan (Cozaar)  Naloxone (Narcan) ==================================================================== For clinical consultation, please call [phone]. ====================================================================       ROS  Constitutional: Denies any fever or chills Gastrointestinal: No reported hemesis, hematochezia, vomiting, or acute GI distress Musculoskeletal: Diffuse arthralgias and myalgias Neurological: No reported episodes of acute onset apraxia, aphasia, dysarthria, agnosia, amnesia, paralysis, loss of coordination, or loss of consciousness  Medication Review  Febuxostat, amLODipine, atorvastatin, colchicine, furosemide, losartan, naloxone, and oxyCODONE-acetaminophen  History Review  Allergy: Nathan Ramos is allergic to nsaids. Drug: Nathan Ramos  reports no history of drug use. Alcohol:  reports no history of alcohol use. Tobacco:  reports that he has been smoking cigarettes. He has a 11.00 pack-year smoking history. He has never used smokeless tobacco. Social: Nathan Ramos  reports that he has been smoking cigarettes. He has a 11.00 pack-year smoking history. He has never used smokeless tobacco. He reports that he does not drink alcohol and does not use drugs. Medical:  has a past medical history of Anemia, Bone spur of other site, Chronic pain, CKD (chronic kidney disease), Controlled substance agreement signed, Gout, Gouty arthritis, History of YAG laser capsulotomy of lens of left eye, Hyperlipidemia, Hypertension, IFG (impaired fasting glucose), Left knee DJD, Medullary cystic disease of the kidney, Primary localized osteoarthritis of right knee, and Smoker. 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 Family: family history includes COPD in his maternal grandmother and mother; Kidney disease in his mother; Stroke (age of onset: 38) in his father; Thyroid disease in his mother.  Laboratory Chemistry Profile   Renal Lab Results  Component Value Date   BUN 47 (H) 06/01/2020   CREATININE 3.06 (H) 06/01/2020   BCR 15 06/01/2020   GFRAA 27 (L) 06/01/2020   GFRNONAA 23 (L) 06/01/2020     Hepatic Lab Results  Component Value Date   AST 15 06/01/2020   ALT 14 10/11/2019   ALBUMIN 4.6 06/01/2020   ALKPHOS 103 06/01/2020     Electrolytes Lab Results  Component Value Date   NA 138 06/01/2020   K 5.7 (H) 06/01/2020   CL 101 06/01/2020   CALCIUM 10.1 06/01/2020     Bone No results found for: VD25OH, VD125OH2TOT, JE5631SH7, [id number], 25OHVITD1, 25OHVITD2, 25OHVITD3, TESTOFREE, TESTOSTERONE   Inflammation (CRP: Acute Phase) (ESR: Chronic Phase) No results found for: CRP, ESRSEDRATE, LATICACIDVEN     Note: Above Lab results reviewed.  Recent Imaging Review  CT 3D INDEPENDENT WKST CLINICAL DATA:  Right calcaneus fracture follow-up.  EXAM: CT OF THE RIGHT FOOT WITHOUT CONTRAST  TECHNIQUE: Multidetector CT imaging of the right foot was performed according to the standard protocol. Multiplanar CT image reconstructions were also generated. 3-dimensional CT images were rendered by post-processing of the original CT data on an acquisition workstation. The 3-dimensional CT images were interpreted and findings were reported in the accompanying complete CT report for this study.  COMPARISON:  Right heel x-rays  dated October 08, 2021.  FINDINGS: Bones/Joint/Cartilage  Subacute to chronic comminuted avulsion fracture of the calcaneal posterior tuberosity with minimal bridging bone. New distraction of the fracture fragments measuring up to 1.2 cm. Nearly healed nondisplaced fracture of the distal fibular metaphysis. No dislocation.  Small unstable osteochondral defect of the lateral talar dome. Age advanced moderate hindfoot and midfoot degenerative changes with joint space narrowing, subchondral cysts, and bulky marginal osteophytes. Moderate tibiotalar joint effusion. Osteopenia.  Ligaments  Ligaments are suboptimally evaluated by CT.  Muscles and Tendons Grossly intact. Thickening of the anterior tibialis and Achilles tendons.  Soft tissue No fluid collection or hematoma.  No soft tissue mass.  IMPRESSION: 1. Subacute to chronic avulsion fracture of the calcaneal posterior tuberosity with minimal bridging bone. New distraction of the fracture fragments measuring up to 1.2 cm. 2. Nearly healed nondisplaced fracture of the distal fibular metaphysis. 3. Small unstable osteochondral defect of the lateral talar dome. 4. Age advanced moderate hindfoot and midfoot degenerative changes.  Electronically Signed   By: Titus Dubin M.D.   On: 11/17/2021 15:40 CT FOOT RIGHT WO CONTRAST CLINICAL DATA:  Right calcaneus fracture follow-up.  EXAM: CT OF THE RIGHT FOOT WITHOUT CONTRAST  TECHNIQUE: Multidetector CT imaging of the right foot was performed according to the standard protocol. Multiplanar CT image reconstructions were also generated. 3-dimensional CT images were rendered by post-processing of the original CT data on an acquisition workstation. The 3-dimensional CT images were interpreted and findings were reported in the accompanying complete CT report for this study.  COMPARISON:  Right heel x-rays dated October 08, 2021.  FINDINGS: Bones/Joint/Cartilage  Subacute  to chronic comminuted avulsion fracture of the calcaneal posterior tuberosity with minimal bridging bone. New distraction of the fracture fragments measuring up to 1.2 cm. Nearly healed nondisplaced fracture of the distal fibular metaphysis. No dislocation.  Small unstable osteochondral defect of the lateral talar dome. Age advanced moderate hindfoot and midfoot degenerative changes with joint space narrowing, subchondral cysts, and bulky marginal osteophytes. Moderate tibiotalar joint effusion. Osteopenia.  Ligaments  Ligaments are suboptimally evaluated by CT.  Muscles and Tendons Grossly intact. Thickening of the anterior tibialis and Achilles tendons.  Soft tissue No fluid collection or hematoma.  No soft tissue mass.  IMPRESSION: 1. Subacute to chronic avulsion fracture of the calcaneal posterior tuberosity with minimal bridging bone. New distraction of the fracture fragments measuring up to 1.2 cm. 2. Nearly healed nondisplaced fracture of the distal fibular metaphysis. 3. Small unstable osteochondral defect of the lateral talar dome. 4. Age advanced moderate hindfoot and midfoot degenerative changes.  Electronically Signed   By: Titus Dubin M.D.   On: 11/17/2021 15:40  Note: Reviewed        Physical Exam  General appearance: alert, cooperative, and in mild distress presents today in wheelchair Mental status: Alert, oriented x 3 (person, place, & time)       Respiratory: No evidence of acute respiratory distress Eyes: PERLA Vitals: BP (!) 154/101   Temp (!) 97.4 F (36.3 C)   Ht 5' 9"  (1.753 m)   Wt 248 lb (112.5 kg)   BMI 36.62 kg/m  BMI: Estimated body mass index is 36.62 kg/m as calculated from the following:   Height as of this encounter: 5' 9"  (1.753 m).   Weight as of this encounter: 248 lb (112.5 kg). Ideal: Ideal body weight: 70.7 kg (155 lb 13.8 oz) Adjusted ideal body weight: 87.4 kg (192 lb 11.5 oz)    Assessment   Status Diagnosis   Controlled Controlled Controlled 1. Chronic pain syndrome   2. Bilateral carpal tunnel syndrome   3. Controlled substance agreement signed   4. 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 Right hand pain   6. Primary osteoarthritis of both knees   7. Medullary cystic disease of the kidney   8. History of bilateral knee replacement        Plan of Care   Nathan Ramos has a current medication list which includes the following long-term medication(s): amlodipine, atorvastatin, furosemide, losartan, colchicine, [START ON 05/06/2022] oxycodone-acetaminophen, [START ON 06/05/2022] oxycodone-acetaminophen, and [START ON 07/05/2022] oxycodone-acetaminophen.   Pharmacotherapy (Medications Ordered): Meds ordered this encounter  Medications   oxyCODONE-acetaminophen (PERCOCET) 10-325 MG tablet    Sig: Take 1 tablet by mouth every 6 (six) hours as needed for pain. Must last 30 days.    Dispense:  120 tablet    Refill:  0    [place] STOP ACT - Not applicable. Fill one day early if pharmacy is closed on scheduled refill date.   oxyCODONE-acetaminophen (PERCOCET) 10-325 MG tablet    Sig: Take 1 tablet by mouth every 6 (six) hours as needed for pain. Must last 30 days.    Dispense:  120 tablet    Refill:  0    [place] STOP ACT - Not applicable. Fill one day early if pharmacy is closed on scheduled refill date.   oxyCODONE-acetaminophen (PERCOCET) 10-325 MG tablet    Sig: Take 1 tablet by mouth every 6 (six) hours as needed for pain. Must last 30 days.    Dispense:  120 tablet    Refill:  0    [place] STOP ACT - Not applicable. Fill one day early if pharmacy is closed on scheduled refill date.    Follow-up plan:   Return in about 15 weeks (around 07/27/2022) for Medication Management, in person.   Recent Visits Date Type Provider Dept  01/26/22 Office Visit Gillis Santa, MD [facility]  Showing recent visits within past 90 days and meeting all other  requirements Today's Visits Date Type Provider Dept  04/13/22 Office Visit Gillis Santa, MD [facility]  Showing today's visits and meeting all other requirements Future Appointments No visits were found meeting these conditions. Showing future appointments within next 90 days and meeting all other requirements  I discussed the assessment and treatment plan with the patient. The patient was provided an opportunity to ask questions and all were answered. The patient agreed with the plan and demonstrated an understanding  of the instructions.  Patient advised to call back or seek an in-person evaluation if the symptoms or condition worsens.  Duration of encounter: 30 minutes.  Note by: Gillis Santa, MD Date: 04/13/2022; Time: 2:41 PM

## 2022-04-13 NOTE — Progress Notes (Signed)
Nursing Pain Medication Assessment:  Safety precautions to be maintained throughout the outpatient stay will include: orient to surroundings, keep bed in low position, maintain call bell within reach at all times, provide assistance with transfer out of bed and ambulation.  Medication Inspection Compliance: Pill count conducted under aseptic conditions, in front of the patient. Neither the pills nor the bottle was removed from the patient's sight at any time. Once count was completed pills were immediately returned to the patient in their original bottle.  Medication: Oxycodone/APAP Pill/Patch Count:  90 of 120 pills remain Pill/Patch Appearance: Markings consistent with prescribed medication Bottle Appearance: Standard pharmacy container. Clearly labeled. Filled Date: 05 / 23 / 2023 Last Medication intake:  Today

## 2022-05-06 ENCOUNTER — Encounter: Payer: Self-pay | Admitting: Student in an Organized Health Care Education/Training Program

## 2022-05-07 ENCOUNTER — Emergency Department
Admission: EM | Admit: 2022-05-07 | Discharge: 2022-05-07 | Disposition: A | Discharge status: Home / Self Care | Payer: Medicare Other | Attending: Emergency Medicine | Admitting: Emergency Medicine

## 2022-05-07 ENCOUNTER — Other Ambulatory Visit: Payer: Self-pay

## 2022-05-07 DIAGNOSIS — F1193 Opioid use, unspecified with withdrawal: Secondary | ICD-10-CM

## 2022-05-07 DIAGNOSIS — F1113 Opioid abuse with withdrawal: Secondary | ICD-10-CM | POA: Insufficient documentation

## 2022-05-07 MED ORDER — OXYCODONE HCL 5 MG PO TABS
10.0000 mg | ORAL_TABLET | Freq: Once | ORAL | Status: AC
Start: 2022-05-07 — End: 2022-05-07
  Administered 2022-05-07: 10 mg via ORAL
  Filled 2022-05-07: qty 2

## 2022-05-07 NOTE — ED Triage Notes (Signed)
Pt takes Oxycodone 10-325 every 6 hours. Pt has been without this medication for the past 36 hours because the pharmacy would not fill the prescription from his pain management clinic.   Pt endorses withdrawal symptoms - sweats, goosebumps, diarrhea and has not slept.

## 2022-05-08 ENCOUNTER — Other Ambulatory Visit: Payer: Self-pay | Admitting: *Deleted

## 2022-05-08 ENCOUNTER — Other Ambulatory Visit: Payer: Self-pay

## 2022-05-08 ENCOUNTER — Emergency Department
Admission: EM | Admit: 2022-05-08 | Discharge: 2022-05-08 | Disposition: A | Discharge status: Home / Self Care | Payer: Medicare Other | Source: Home / Self Care | Attending: Emergency Medicine | Admitting: Emergency Medicine

## 2022-05-08 DIAGNOSIS — F1193 Opioid use, unspecified with withdrawal: Secondary | ICD-10-CM

## 2022-05-08 DIAGNOSIS — M170 Bilateral primary osteoarthritis of knee: Secondary | ICD-10-CM

## 2022-05-08 DIAGNOSIS — G894 Chronic pain syndrome: Secondary | ICD-10-CM

## 2022-05-08 DIAGNOSIS — F1113 Opioid abuse with withdrawal: Secondary | ICD-10-CM | POA: Diagnosis not present

## 2022-05-08 DIAGNOSIS — M1A39X1 Chronic gout due to renal impairment, multiple sites, with tophus (tophi): Secondary | ICD-10-CM

## 2022-05-08 MED ORDER — OXYCODONE-ACETAMINOPHEN 10-325 MG PO TABS: 1.0000 | ORAL_TABLET | Freq: Four times a day (QID) | ORAL | 0 refills | Status: DC | PRN

## 2022-05-08 MED ORDER — OXYCODONE HCL 5 MG PO TABS
10.0000 mg | ORAL_TABLET | Freq: Once | ORAL | Status: AC
  Administered 2022-05-08: 10 mg via ORAL
  Filled 2022-05-08: qty 2

## 2022-05-17 ENCOUNTER — Other Ambulatory Visit: Payer: Self-pay | Admitting: *Deleted

## 2022-05-17 ENCOUNTER — Telehealth: Payer: Self-pay | Admitting: Student in an Organized Health Care Education/Training Program

## 2022-05-17 DIAGNOSIS — M170 Bilateral primary osteoarthritis of knee: Secondary | ICD-10-CM

## 2022-05-17 DIAGNOSIS — G894 Chronic pain syndrome: Secondary | ICD-10-CM

## 2022-05-17 DIAGNOSIS — M1A39X1 Chronic gout due to renal impairment, multiple sites, with tophus (tophi): Secondary | ICD-10-CM

## 2022-05-17 NOTE — Telephone Encounter (Signed)
Rx request sent to Dr. Cherylann Ratel for Oxycodone prescriptions for 05/2022 and 06/2022

## 2022-05-17 NOTE — Telephone Encounter (Signed)
Patient stated that current pharmacy will not refilled his meds. Patient stated that the pharmacy said that they couldn't refilled because patient isn't a regular patient. [facility] to see if [facility] [address] in [place]. Please give patient a call. Thanks

## 2022-05-18 MED ORDER — OXYCODONE-ACETAMINOPHEN 10-325 MG PO TABS: 1.0000 | ORAL_TABLET | Freq: Four times a day (QID) | ORAL | 0 refills | Status: DC | PRN

## 2022-07-18 ENCOUNTER — Encounter: Payer: Self-pay | Admitting: Student in an Organized Health Care Education/Training Program

## 2022-07-18 ENCOUNTER — Ambulatory Visit
Payer: Medicare Other | Attending: Student in an Organized Health Care Education/Training Program | Admitting: Student in an Organized Health Care Education/Training Program

## 2022-07-18 VITALS — BP 159/97 | HR 91 | Temp 98.2°F | Resp 16 | Ht 70.0 in | Wt 250.0 lb

## 2022-07-18 DIAGNOSIS — M79641 Pain in right hand: Secondary | ICD-10-CM | POA: Diagnosis present

## 2022-07-18 DIAGNOSIS — G5603 Carpal tunnel syndrome, bilateral upper limbs: Secondary | ICD-10-CM | POA: Insufficient documentation

## 2022-07-18 DIAGNOSIS — M1A39X1 Chronic gout due to renal impairment, multiple sites, with tophus (tophi): Secondary | ICD-10-CM | POA: Diagnosis present

## 2022-07-18 DIAGNOSIS — G894 Chronic pain syndrome: Secondary | ICD-10-CM | POA: Insufficient documentation

## 2022-07-18 DIAGNOSIS — M170 Bilateral primary osteoarthritis of knee: Secondary | ICD-10-CM | POA: Insufficient documentation

## 2022-07-18 DIAGNOSIS — Z79899 Other long term (current) drug therapy: Secondary | ICD-10-CM | POA: Diagnosis present

## 2022-07-18 MED ORDER — OXYCODONE-ACETAMINOPHEN 10-325 MG PO TABS: 1.0000 | ORAL_TABLET | Freq: Four times a day (QID) | ORAL | 0 refills | Status: DC | PRN

## 2022-07-18 NOTE — Progress Notes (Signed)
Nursing Pain Medication Assessment:  Safety precautions to be maintained throughout the outpatient stay will include: orient to surroundings, keep bed in low position, maintain call bell within reach at all times, provide assistance with transfer out of bed and ambulation.  Medication Inspection Compliance: Pill count conducted under aseptic conditions, in front of the patient. Neither the pills nor the bottle was removed from the patient's sight at any time. Once count was completed pills were immediately returned to the patient in their original bottle.  Medication: Oxycodone/APAP Pill/Patch Count:  72 of 120 pills remain Pill/Patch Appearance: Markings consistent with prescribed medication Bottle Appearance: Standard pharmacy container. Clearly labeled. Filled Date: 8 / 24 / 2023 Last Medication intake:  Today

## 2022-07-18 NOTE — Progress Notes (Signed)
PROVIDER NOTE: Information contained herein reflects review and annotations entered in association with encounter. Interpretation of such information and data should be left to medically-trained personnel. Information provided to patient can be located elsewhere in the medical record under "Patient Instructions". Document created using STT-dictation technology, any transcriptional errors that may result from process are unintentional.    Patient: Nathan Ramos  Service Category: E/M  Provider: Gillis Santa, MD  DOB: 12-13-1970  DOS: 07/18/2022  Specialty: Interventional Pain Management  MRN: [id number]  Setting: Ambulatory outpatient  PCP: [facility] [place]  Type: Established Patient    Referring Provider: [facility]  Location: Office  Delivery: Face-to-face     HPI  [name], a 51 y.o. year old male, is here today because of his Chronic pain syndrome [G89.4]. [name] primary complain today is Pain (Chronic pain syndrome; "pain is everywhere")  Last encounter: My last encounter with him was on 04/13/22  Pertinent problems: [name] has Medullary cystic disease of the kidney; Chronic pain; Gout due to renal impairment; Primary localized osteoarthritis of right knee; Chronic pain syndrome; and Opioid use on their pertinent problem list. Pain Assessment: Severity of Chronic pain is reported as a 4 /10. Location:    /"my pain goes everywhere". Onset:  . Quality:  . Timing:  . Modifying factor(s):  Marland Kitchen Vitals:  height is 5' 10"  (1.778 m) and weight is 250 lb (113.4 kg). His temporal temperature is 98.2 F (36.8 C). His blood pressure is 159/97 (abnormal) and his pulse is 91. His respiration is 16 and oxygen saturation is 98%.   Reason for encounter: medication management.    Patient had 2 visits to the emergency department on 05/07/2022 and 05/08/2022 for opioid withdrawal as his pharmacy did not have his oxycodone prescription.  Patient is frustrated with his  interaction with [facility]. Patient has found a new pharmacy, [facility] to fill his prescriptions. Otherwise no change in his medical history.  Pharmacotherapy Assessment   04/06/2022  01/26/2022   1  Oxycodone-Acetaminophen 10-325 120.00  30  [place]  [id number]   [facility] (5798)  0/0  60.00 MME  Medicare  [place]     Pharmacotherapy Assessment  Analgesic: Percocet 10 mg every 6 hours daily as needed, quantity 120/month, MME 60,    Monitoring: [place] PMP: PDMP reviewed during this encounter.       Pharmacotherapy: No side-effects or adverse reactions reported. Compliance: No problems identified. Effectiveness: Clinically acceptable.  UDS:  Summary  Date Value Ref Range Status  01/26/2022 Note  Final    Comment:    ==================================================================== [employer] Select 13 (MW) ==================================================================== Test                             Result       Flag       Units  Drug Present and Declared for Prescription Verification   Oxycodone                      2177         EXPECTED   ng/mg creat   Oxymorphone                    6148         EXPECTED   ng/mg creat   Noroxycodone  2054         EXPECTED   ng/mg creat   Noroxymorphone                 1030         EXPECTED   ng/mg creat    Sources of oxycodone are scheduled prescription medications.    Oxymorphone, noroxycodone, and noroxymorphone are expected    metabolites of oxycodone. Oxymorphone is also available as a    scheduled prescription medication.  ==================================================================== Test                      Result    Flag   Units      Ref Range   Creatinine              81               mg/dL      >=20 ==================================================================== Declared Medications:  The flagging and interpretation on this report are based on the  following declared medications.  Unexpected results may  arise from  inaccuracies in the declared medications.   **Note: The testing scope of this panel includes these medications:   Oxycodone (Percocet)   **Note: The testing scope of this panel does not include the  following reported medications:   Acetaminophen (Percocet)  Amlodipine (Norvasc)  Atorvastatin (Lipitor)  Colchicine  Febuxostat  Furosemide (Lasix)  Losartan (Cozaar)  Naloxone (Narcan) ==================================================================== For clinical consultation, please call [phone]. ====================================================================       ROS  Constitutional: Denies any fever or chills Gastrointestinal: No reported hemesis, hematochezia, vomiting, or acute GI distress Musculoskeletal: Diffuse arthralgias and myalgias Neurological: No reported episodes of acute onset apraxia, aphasia, dysarthria, agnosia, amnesia, paralysis, loss of coordination, or loss of consciousness  Medication Review  Febuxostat, amLODipine, atorvastatin, colchicine, furosemide, losartan, naloxone, and oxyCODONE-acetaminophen  History Review  Allergy: [name] is allergic to nsaids. Drug: [name]  reports no history of drug use. Alcohol:  reports no history of alcohol use. Tobacco:  reports that he has been smoking cigarettes. He has a 11.00 pack-year smoking history. He has never used smokeless tobacco. Social: [name]  reports that he has been smoking cigarettes. He has a 11.00 pack-year smoking history. He has never used smokeless tobacco. He reports that he does not drink alcohol and does not use drugs. Medical:  has a past medical history of Anemia, Bone spur of other site, Chronic pain, CKD (chronic kidney disease), Controlled substance agreement signed, Gout, Gouty arthritis, History of YAG laser capsulotomy of lens of left eye, Hyperlipidemia, Hypertension, IFG (impaired fasting glucose), Left knee DJD, Medullary cystic disease of the  kidney, Primary localized osteoarthritis of right knee, and Smoker. Surgical: [name]  has a past surgical history that includes Carpal tunnel release (Left); Umbilical hernia repair (2010); Cataract extraction w/ intraocular lens implant (Left, 2008); Cataract extraction w/ intraocular lens implant (Right, 2008); Hernia repair; Total knee arthroplasty (Left, 03/30/2014); Eye surgery; Total knee arthroplasty (Right, 10/26/2014); and Joint replacement (Left, 03/2014). Family: family history includes COPD in his maternal grandmother and mother; Kidney disease in his mother; Stroke (age of onset: 52) in his father; Thyroid disease in his mother.  Laboratory Chemistry Profile   Renal Lab Results  Component Value Date   BUN 47 (H) 06/01/2020   CREATININE 3.06 (H) 06/01/2020   BCR 15 06/01/2020   GFRAA 27 (L) 06/01/2020   GFRNONAA 23 (L) 06/01/2020     Hepatic Lab  Results  Component Value Date   AST 15 06/01/2020   ALT 14 10/11/2019   ALBUMIN 4.6 06/01/2020   ALKPHOS 103 06/01/2020     Electrolytes Lab Results  Component Value Date   NA 138 06/01/2020   K 5.7 (H) 06/01/2020   CL 101 06/01/2020   CALCIUM 10.1 06/01/2020     Bone No results found for: "VD25OH", "VD125OH2TOT", "MN8177NH6", "FB9038BF3", "25OHVITD1", "25OHVITD2", "25OHVITD3", "TESTOFREE", "TESTOSTERONE"   Inflammation (CRP: Acute Phase) (ESR: Chronic Phase) No results found for: "CRP", "ESRSEDRATE", "LATICACIDVEN"     Note: Above Lab results reviewed.  Recent Imaging Review  CT 3D INDEPENDENT [facility]KST CLINICAL DATA:  Right calcaneus fracture follow-up.  EXAM: CT OF THE RIGHT FOOT WITHOUT CONTRAST  TECHNIQUE: Multidetector CT imaging of the right foot was performed according to the standard protocol. Multiplanar CT image reconstructions were also generated. 3-dimensional CT images were rendered by post-processing of the original CT data on an acquisition workstation. The 3-dimensional CT images were interpreted  and findings were reported in the accompanying complete CT report for this study.  COMPARISON:  Right heel x-rays dated October 08, 2021.  FINDINGS: Bones/Joint/Cartilage  Subacute to chronic comminuted avulsion fracture of the calcaneal posterior tuberosity with minimal bridging bone. New distraction of the fracture fragments measuring up to 1.2 cm. Nearly healed nondisplaced fracture of the distal fibular metaphysis. No dislocation.  Small unstable osteochondral defect of the lateral talar dome. Age advanced moderate hindfoot and midfoot degenerative changes with joint space narrowing, subchondral cysts, and bulky marginal osteophytes. Moderate tibiotalar joint effusion. Osteopenia.  Ligaments  Ligaments are suboptimally evaluated by CT.  Muscles and Tendons Grossly intact. Thickening of the anterior tibialis and Achilles tendons.  Soft tissue No fluid collection or hematoma.  No soft tissue mass.  IMPRESSION: 1. Subacute to chronic avulsion fracture of the calcaneal posterior tuberosity with minimal bridging bone. New distraction of the fracture fragments measuring up to 1.2 cm. 2. Nearly healed nondisplaced fracture of the distal fibular metaphysis. 3. Small unstable osteochondral defect of the lateral talar dome. 4. Age advanced moderate hindfoot and midfoot degenerative changes.  Electronically Signed   By: Titus Dubin M.D.   On: 11/17/2021 15:40 CT FOOT RIGHT WO CONTRAST CLINICAL DATA:  Right calcaneus fracture follow-up.  EXAM: CT OF THE RIGHT FOOT WITHOUT CONTRAST  TECHNIQUE: Multidetector CT imaging of the right foot was performed according to the standard protocol. Multiplanar CT image reconstructions were also generated. 3-dimensional CT images were rendered by post-processing of the original CT data on an acquisition workstation. The 3-dimensional CT images were interpreted and findings were reported in the accompanying complete CT report  for this study.  COMPARISON:  Right heel x-rays dated October 08, 2021.  FINDINGS: Bones/Joint/Cartilage  Subacute to chronic comminuted avulsion fracture of the calcaneal posterior tuberosity with minimal bridging bone. New distraction of the fracture fragments measuring up to 1.2 cm. Nearly healed nondisplaced fracture of the distal fibular metaphysis. No dislocation.  Small unstable osteochondral defect of the lateral talar dome. Age advanced moderate hindfoot and midfoot degenerative changes with joint space narrowing, subchondral cysts, and bulky marginal osteophytes. Moderate tibiotalar joint effusion. Osteopenia.  Ligaments  Ligaments are suboptimally evaluated by CT.  Muscles and Tendons Grossly intact. Thickening of the anterior tibialis and Achilles tendons.  Soft tissue No fluid collection or hematoma.  No soft tissue mass.  IMPRESSION: 1. Subacute to chronic avulsion fracture of the calcaneal posterior tuberosity with minimal bridging bone. New distraction of the fracture fragments measuring  up to 1.2 cm. 2. Nearly healed nondisplaced fracture of the distal fibular metaphysis. 3. Small unstable osteochondral defect of the lateral talar dome. 4. Age advanced moderate hindfoot and midfoot degenerative changes.  Electronically Signed   By: Titus Dubin M.D.   On: 11/17/2021 15:40  Note: Reviewed        Physical Exam  General appearance: alert, cooperative, and in mild distress presents today in wheelchair Mental status: Alert, oriented x 3 (person, place, & time)       Respiratory: No evidence of acute respiratory distress Eyes: PERLA Vitals: BP (!) 159/97   Pulse 91   Temp 98.2 F (36.8 C) (Temporal)   Resp 16   Ht 5' 10"  (1.778 m)   Wt 250 lb (113.4 kg)   SpO2 98%   BMI 35.87 kg/m  BMI: Estimated body mass index is 35.87 kg/m as calculated from the following:   Height as of this encounter: 5' 10"  (1.778 m).   Weight as of this encounter:  250 lb (113.4 kg). Ideal: Ideal body weight: 73 kg (160 lb 15 oz) Adjusted ideal body weight: 89.2 kg (196 lb 9 oz)    Assessment   Status Diagnosis  Controlled Controlled Controlled 1. Chronic pain syndrome   2. Bilateral carpal tunnel syndrome   3. Controlled substance agreement signed   4. 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 Right hand pain   6. Primary osteoarthritis of both knees         Plan of Care   [name] has a current medication list which includes the following long-term medication(s): amlodipine, atorvastatin, colchicine, furosemide, losartan, [START ON 08/05/2022] oxycodone-acetaminophen, [START ON 09/04/2022] oxycodone-acetaminophen, and [START ON 10/04/2022] oxycodone-acetaminophen.   Pharmacotherapy (Medications Ordered): Meds ordered this encounter  Medications   oxyCODONE-acetaminophen (PERCOCET) 10-325 MG tablet    Sig: Take 1 tablet by mouth every 6 (six) hours as needed for pain. Must last 30 days.    Dispense:  120 tablet    Refill:  0    [place] STOP ACT - Not applicable. Fill one day early if pharmacy is closed on scheduled refill date.   oxyCODONE-acetaminophen (PERCOCET) 10-325 MG tablet    Sig: Take 1 tablet by mouth every 6 (six) hours as needed for pain. Must last 30 days.    Dispense:  120 tablet    Refill:  0    [place] STOP ACT - Not applicable. Fill one day early if pharmacy is closed on scheduled refill date.   oxyCODONE-acetaminophen (PERCOCET) 10-325 MG tablet    Sig: Take 1 tablet by mouth every 6 (six) hours as needed for pain. Must last 30 days.    Dispense:  120 tablet    Refill:  0    [place] STOP ACT - Not applicable. Fill one day early if pharmacy is closed on scheduled refill date.    Follow-up plan:   Return in about 3 months (around 10/17/2022) for Medication Management, in person.   Recent Visits No visits were found meeting these conditions. Showing recent visits within past 90 days and  meeting all other requirements Today's Visits Date Type Provider Dept  07/18/22 Office Visit Gillis Santa, MD [facility]  Showing today's visits and meeting all other requirements Future Appointments No visits were found meeting these conditions. Showing future appointments within next 90 days and meeting all other requirements  I discussed the assessment and treatment plan with the patient. The  patient was provided an opportunity to ask questions and all were answered. The patient agreed with the plan and demonstrated an understanding of the instructions.  Patient advised to call back or seek an in-person evaluation if the symptoms or condition worsens.  Duration of encounter: 30 minutes.  Note by: Gillis Santa, MD Date: 07/18/2022; Time: 11:37 AM

## 2022-07-26 ENCOUNTER — Telehealth: Payer: Self-pay

## 2022-07-31 NOTE — Telephone Encounter (Signed)
Rescheduled 12/12 to 11/29 Ok per patient

## 2022-10-11 ENCOUNTER — Encounter: Payer: Self-pay | Admitting: Student in an Organized Health Care Education/Training Program

## 2022-10-11 ENCOUNTER — Ambulatory Visit
Payer: Medicare Other | Attending: Student in an Organized Health Care Education/Training Program | Admitting: Student in an Organized Health Care Education/Training Program

## 2022-10-11 VITALS — BP 184/95 | HR 106 | Temp 98.4°F | Resp 16 | Ht 70.0 in | Wt 275.0 lb

## 2022-10-11 DIAGNOSIS — G894 Chronic pain syndrome: Secondary | ICD-10-CM | POA: Insufficient documentation

## 2022-10-11 DIAGNOSIS — G5603 Carpal tunnel syndrome, bilateral upper limbs: Secondary | ICD-10-CM | POA: Insufficient documentation

## 2022-10-11 DIAGNOSIS — Z79899 Other long term (current) drug therapy: Secondary | ICD-10-CM | POA: Insufficient documentation

## 2022-10-11 DIAGNOSIS — M1A39X1 Chronic gout due to renal impairment, multiple sites, with tophus (tophi): Secondary | ICD-10-CM | POA: Insufficient documentation

## 2022-10-11 DIAGNOSIS — M79641 Pain in right hand: Secondary | ICD-10-CM | POA: Insufficient documentation

## 2022-10-11 DIAGNOSIS — M25531 Pain in right wrist: Secondary | ICD-10-CM | POA: Diagnosis present

## 2022-10-11 DIAGNOSIS — Q615 Medullary cystic kidney: Secondary | ICD-10-CM | POA: Diagnosis present

## 2022-10-11 DIAGNOSIS — M170 Bilateral primary osteoarthritis of knee: Secondary | ICD-10-CM | POA: Insufficient documentation

## 2022-10-11 MED ORDER — OXYCODONE-ACETAMINOPHEN 10-325 MG PO TABS: 1.0000 | ORAL_TABLET | Freq: Four times a day (QID) | ORAL | 0 refills | Status: DC | PRN

## 2022-10-11 NOTE — Progress Notes (Signed)
Nursing Pain Medication Assessment:  Safety precautions to be maintained throughout the outpatient stay will include: orient to surroundings, keep bed in low position, maintain call bell within reach at all times, provide assistance with transfer out of bed and ambulation.  Medication Inspection Compliance: Pill count conducted under aseptic conditions, in front of the patient. Neither the pills nor the bottle was removed from the patient's sight at any time. Once count was completed pills were immediately returned to the patient in their original bottle.  Medication: Oxycodone/APAP Pill/Patch Count:  89 of 120 pills remain Pill/Patch Appearance: Markings consistent with prescribed medication Bottle Appearance: Standard pharmacy container. Clearly labeled. Filled Date: 14 / 22 / 2023 Last Medication intake:  Today

## 2022-10-11 NOTE — Patient Instructions (Signed)

## 2022-10-11 NOTE — Progress Notes (Signed)
PROVIDER NOTE: Information contained herein reflects review and annotations entered in association with encounter. Interpretation of such information and data should be left to medically-trained personnel. Information provided to patient can be located elsewhere in the medical record under "Patient Instructions". Document created using STT-dictation technology, any transcriptional errors that may result from process are unintentional.    Patient: Nathan Ramos  Service Category: E/M  Provider: Gillis Santa, MD  DOB: 05-25-1971  DOS: 10/11/2022  Specialty: Interventional Pain Management  MRN: [id number]  Setting: Ambulatory outpatient  PCP: [facility]  Type: Established Patient    Referring Provider: [facility]  Location: Office  Delivery: Face-to-face     HPI  [name], a 51 y.o. year old male, is here today because of his Chronic pain syndrome [G89.4]. Mr. Levit primary complain today is Neck Pain  Last encounter: My last encounter with him was on 07/18/22  Pertinent problems: Mr. Wellbrock has Medullary cystic disease of the kidney; Chronic pain; Gout due to renal impairment; Primary localized osteoarthritis of right knee; Chronic pain syndrome; and Opioid use on their pertinent problem list. Pain Assessment: Severity of Chronic pain is reported as a 6 /10. Location: Knee Right, Left/ . Onset: More than a month ago. Quality: Aching, Discomfort, Burning, Constant. Timing: Constant. Modifying factor(s): meds,rest. Vitals:  height is _0  (1.778 m) and weight is 275 lb (124.7 kg). His temperature is 98.4 F (36.9 C). His blood pressure is 184/95 (abnormal) and his pulse is 106 (abnormal). His respiration is 16 and oxygen saturation is 100%.   Reason for encounter: medication management.    No change in medical history since last visit.  Patient's pain is at baseline.  Patient continues multimodal pain regimen as prescribed.  States that it provides pain  relief and improvement in functional status.    Pharmacotherapy Assessment   10/04/2022 07/18/2022  2 Oxycodone-Acetaminophen 10-325 120.00 30 Bi Lat [id number] Nor (2541) 0/0 60.00 MME Medicare [place]    Pharmacotherapy Assessment  Analgesic: Percocet 10 mg every 6 hours daily as needed, quantity 120/month, MME 60,    Monitoring: [place] PMP: PDMP reviewed during this encounter.       Pharmacotherapy: No side-effects or adverse reactions reported. Compliance: No problems identified. Effectiveness: Clinically acceptable.  UDS:  Summary  Date Value Ref Range Status  01/26/2022 Note  Final    Comment:    ==================================================================== [employer] Select 13 (MW) ==================================================================== Test                             Result       Flag       Units  Drug Present and Declared for Prescription Verification   Oxycodone                      2177         EXPECTED   ng/mg creat   Oxymorphone                    6148         EXPECTED   ng/mg creat   Noroxycodone                   2054         EXPECTED   ng/mg creat   Noroxymorphone  1030         EXPECTED   ng/mg creat    Sources of oxycodone are scheduled prescription medications.    Oxymorphone, noroxycodone, and noroxymorphone are expected    metabolites of oxycodone. Oxymorphone is also available as a    scheduled prescription medication.  ==================================================================== Test                      Result    Flag   Units      Ref Range   Creatinine              81               mg/dL      >=20 ==================================================================== Declared Medications:  The flagging and interpretation on this report are based on the  following declared medications.  Unexpected results may arise from  inaccuracies in the declared medications.   **Note: The testing scope of this panel includes these  medications:   Oxycodone (Percocet)   **Note: The testing scope of this panel does not include the  following reported medications:   Acetaminophen (Percocet)  Amlodipine (Norvasc)  Atorvastatin (Lipitor)  Colchicine  Febuxostat  Furosemide (Lasix)  Losartan (Cozaar)  Naloxone (Narcan) ==================================================================== For clinical consultation, please call [phone]. ====================================================================       ROS  Constitutional: Denies any fever or chills Gastrointestinal: No reported hemesis, hematochezia, vomiting, or acute GI distress Musculoskeletal: Diffuse arthralgias and myalgias Neurological: No reported episodes of acute onset apraxia, aphasia, dysarthria, agnosia, amnesia, paralysis, loss of coordination, or loss of consciousness  Medication Review  Febuxostat, amLODipine, atorvastatin, colchicine, furosemide, losartan, naloxone, and oxyCODONE-acetaminophen  History Review  Allergy: Mr. Cooprider is allergic to nsaids. Drug: Mr. Koskinen  reports no history of drug use. Alcohol:  reports no history of alcohol use. Tobacco:  reports that he has been smoking cigarettes. He has a 11.00 pack-year smoking history. He has never used smokeless tobacco. Social: Mr. Leder  reports that he has been smoking cigarettes. He has a 11.00 pack-year smoking history. He has never used smokeless tobacco. He reports that he does not drink alcohol and does not use drugs. Medical:  has a past medical history of Anemia, Bone spur of other site, Chronic pain, CKD (chronic kidney disease), Controlled substance agreement signed, Gout, Gouty arthritis, History of YAG laser capsulotomy of lens of left eye, Hyperlipidemia, Hypertension, IFG (impaired fasting glucose), Left knee DJD, Medullary cystic disease of the kidney, Primary localized osteoarthritis of right knee, and Smoker. Surgical: Mr. Matters  has a past surgical history  that includes Carpal tunnel release (Left); Umbilical hernia repair (2010); Cataract extraction w/ intraocular lens implant (Left, 2008); Cataract extraction w/ intraocular lens implant (Right, 2008); Hernia repair; Total knee arthroplasty (Left, 03/30/2014); Eye surgery; Total knee arthroplasty (Right, 10/26/2014); and Joint replacement (Left, 03/2014). Family: family history includes COPD in his maternal grandmother and mother; Kidney disease in his mother; Stroke (age of onset: 27) in his father; Thyroid disease in his mother.  Laboratory Chemistry Profile   Renal Lab Results  Component Value Date   BUN 47 (H) 06/01/2020   CREATININE 3.06 (H) 06/01/2020   BCR 15 06/01/2020   GFRAA 27 (L) 06/01/2020   GFRNONAA 23 (L) 06/01/2020     Hepatic Lab Results  Component Value Date   AST 15 06/01/2020   ALT 14 10/11/2019   ALBUMIN 4.6 06/01/2020   ALKPHOS 103 06/01/2020     Electrolytes Lab Results  Component Value Date   NA 138 06/01/2020   K 5.7 (H) 06/01/2020   CL 101 06/01/2020   CALCIUM 10.1 06/01/2020     Bone No results found for: "VD25OH", "VD125OH2TOT", "AS5053ZJ6", "BH4193XT0", "25OHVITD1", "25OHVITD2", "25OHVITD3", "TESTOFREE", "TESTOSTERONE"   Inflammation (CRP: Acute Phase) (ESR: Chronic Phase) No results found for: "CRP", "ESRSEDRATE", "LATICACIDVEN"     Note: Above Lab results reviewed.  Recent Imaging Review  CT 3D INDEPENDENT [facility]ST CLINICAL DATA:  Right calcaneus fracture follow-up.  EXAM: CT OF THE RIGHT FOOT WITHOUT CONTRAST  TECHNIQUE: Multidetector CT imaging of the right foot was performed according to the standard protocol. Multiplanar CT image reconstructions were also generated. 3-dimensional CT images were rendered by post-processing of the original CT data on an acquisition workstation. The 3-dimensional CT images were interpreted and findings were reported in the accompanying complete CT report for this study.  COMPARISON:  Right heel x-rays  dated October 08, 2021.  FINDINGS: Bones/Joint/Cartilage  Subacute to chronic comminuted avulsion fracture of the calcaneal posterior tuberosity with minimal bridging bone. New distraction of the fracture fragments measuring up to 1.2 cm. Nearly healed nondisplaced fracture of the distal fibular metaphysis. No dislocation.  Small unstable osteochondral defect of the lateral talar dome. Age advanced moderate hindfoot and midfoot degenerative changes with joint space narrowing, subchondral cysts, and bulky marginal osteophytes. Moderate tibiotalar joint effusion. Osteopenia.  Ligaments  Ligaments are suboptimally evaluated by CT.  Muscles and Tendons Grossly intact. Thickening of the anterior tibialis and Achilles tendons.  Soft tissue No fluid collection or hematoma.  No soft tissue mass.  IMPRESSION: 1. Subacute to chronic avulsion fracture of the calcaneal posterior tuberosity with minimal bridging bone. New distraction of the fracture fragments measuring up to 1.2 cm. 2. Nearly healed nondisplaced fracture of the distal fibular metaphysis. 3. Small unstable osteochondral defect of the lateral talar dome. 4. Age advanced moderate hindfoot and midfoot degenerative changes.  Electronically Signed   By: Titus Dubin M.D.   On: 11/17/2021 15:40 CT FOOT RIGHT WO CONTRAST CLINICAL DATA:  Right calcaneus fracture follow-up.  EXAM: CT OF THE RIGHT FOOT WITHOUT CONTRAST  TECHNIQUE: Multidetector CT imaging of the right foot was performed according to the standard protocol. Multiplanar CT image reconstructions were also generated. 3-dimensional CT images were rendered by post-processing of the original CT data on an acquisition workstation. The 3-dimensional CT images were interpreted and findings were reported in the accompanying complete CT report for this study.  COMPARISON:  Right heel x-rays dated October 08, 2021.  FINDINGS: Bones/Joint/Cartilage  Subacute  to chronic comminuted avulsion fracture of the calcaneal posterior tuberosity with minimal bridging bone. New distraction of the fracture fragments measuring up to 1.2 cm. Nearly healed nondisplaced fracture of the distal fibular metaphysis. No dislocation.  Small unstable osteochondral defect of the lateral talar dome. Age advanced moderate hindfoot and midfoot degenerative changes with joint space narrowing, subchondral cysts, and bulky marginal osteophytes. Moderate tibiotalar joint effusion. Osteopenia.  Ligaments  Ligaments are suboptimally evaluated by CT.  Muscles and Tendons Grossly intact. Thickening of the anterior tibialis and Achilles tendons.  Soft tissue No fluid collection or hematoma.  No soft tissue mass.  IMPRESSION: 1. Subacute to chronic avulsion fracture of the calcaneal posterior tuberosity with minimal bridging bone. New distraction of the fracture fragments measuring up to 1.2 cm. 2. Nearly healed nondisplaced fracture of the distal fibular metaphysis. 3. Small unstable osteochondral defect of the lateral talar dome. 4. Age advanced moderate hindfoot and midfoot degenerative changes.  Electronically Signed   By: Titus Dubin M.D.   On: 11/17/2021 15:40  Note: Reviewed        Physical Exam  General appearance: alert, cooperative, and in mild distress presents today in wheelchair Mental status: Alert, oriented x 3 (person, place, & time)       Respiratory: No evidence of acute respiratory distress Eyes: PERLA Vitals: BP (!) 184/95   Pulse (!) 106   Temp 98.4 F (36.9 C)   Resp 16   Ht _0  (1.778 m)   Wt 275 lb (124.7 kg)   SpO2 100%   BMI 39.46 kg/m  BMI: Estimated body mass index is 39.46 kg/m as calculated from the following:   Height as of this encounter: _1  (1.778 m).   Weight as of this encounter: 275 lb (124.7 kg). Ideal: Ideal body weight: 73 kg (160 lb 15 oz) Adjusted ideal body weight: 93.7 kg (206 lb 9  oz)  Cervicalgia   Assessment   Status Diagnosis  Controlled Controlled Controlled 1. Chronic pain syndrome   2. Bilateral carpal tunnel syndrome   3. Controlled substance agreement signed   4. 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 Right hand pain   6. Primary osteoarthritis of both knees   7. Medullary cystic disease of the kidney   8. Right wrist pain          Plan of Care   Mr. LAVAN IMES has a current medication list which includes the following long-term medication(s): amlodipine, atorvastatin, furosemide, losartan, colchicine, [START ON 11/03/2022] oxycodone-acetaminophen, [START ON 12/03/2022] oxycodone-acetaminophen, and [START ON 01/02/2023] oxycodone-acetaminophen.   Pharmacotherapy (Medications Ordered): Meds ordered this encounter  Medications   oxyCODONE-acetaminophen (PERCOCET) 10-325 MG tablet    Sig: Take 1 tablet by mouth every 6 (six) hours as needed for pain. Must last 30 days.    Dispense:  120 tablet    Refill:  0    [place] STOP ACT - Not applicable. Fill one day early if pharmacy is closed on scheduled refill date.   oxyCODONE-acetaminophen (PERCOCET) 10-325 MG tablet    Sig: Take 1 tablet by mouth every 6 (six) hours as needed for pain. Must last 30 days.    Dispense:  120 tablet    Refill:  0    [place] STOP ACT - Not applicable. Fill one day early if pharmacy is closed on scheduled refill date.   oxyCODONE-acetaminophen (PERCOCET) 10-325 MG tablet    Sig: Take 1 tablet by mouth every 6 (six) hours as needed for pain. Must last 30 days.    Dispense:  120 tablet    Refill:  0     STOP ACT - Not applicable. Fill one day early if pharmacy is closed on scheduled refill date.    Follow-up plan:   Return in about 3 months (around 01/11/2023) for Medication Management, in person.   Recent Visits Date Type Provider Dept  07/18/22 Office Visit Gillis Santa, MD [facility]  Showing recent visits within past 90  days and meeting all other requirements Today's Visits Date Type Provider Dept  10/11/22 Office Visit Gillis Santa, MD [facility]  Showing today's visits and meeting all other requirements Future Appointments No visits were found meeting these conditions. Showing future appointments within next 90 days and meeting all other requirements  I discussed the assessment and treatment plan with the patient. The patient was provided an opportunity to ask questions and  all were answered. The patient agreed with the plan and demonstrated an understanding of the instructions.  Patient advised to call back or seek an in-person evaluation if the symptoms or condition worsens.  Duration of encounter: 30 minutes.  Note by: Gillis Santa, MD Date: 10/11/2022; Time: 2:30 PM

## 2022-10-24 ENCOUNTER — Encounter: Payer: Medicare Other | Admitting: Student in an Organized Health Care Education/Training Program

## 2023-01-25 ENCOUNTER — Ambulatory Visit
Payer: Medicare Other | Attending: Student in an Organized Health Care Education/Training Program | Admitting: Student in an Organized Health Care Education/Training Program

## 2023-01-25 ENCOUNTER — Encounter: Payer: Self-pay | Admitting: Student in an Organized Health Care Education/Training Program

## 2023-01-25 VITALS — BP 170/95 | HR 102 | Temp 98.0°F | Ht 69.0 in | Wt 255.0 lb

## 2023-01-25 DIAGNOSIS — G894 Chronic pain syndrome: Secondary | ICD-10-CM | POA: Diagnosis present

## 2023-01-25 DIAGNOSIS — M79641 Pain in right hand: Secondary | ICD-10-CM | POA: Diagnosis present

## 2023-01-25 DIAGNOSIS — M170 Bilateral primary osteoarthritis of knee: Secondary | ICD-10-CM | POA: Insufficient documentation

## 2023-01-25 DIAGNOSIS — Z96653 Presence of artificial knee joint, bilateral: Secondary | ICD-10-CM | POA: Diagnosis present

## 2023-01-25 DIAGNOSIS — M1A39X1 Chronic gout due to renal impairment, multiple sites, with tophus (tophi): Secondary | ICD-10-CM | POA: Insufficient documentation

## 2023-01-25 DIAGNOSIS — M25531 Pain in right wrist: Secondary | ICD-10-CM | POA: Diagnosis present

## 2023-01-25 DIAGNOSIS — Q615 Medullary cystic kidney: Secondary | ICD-10-CM | POA: Diagnosis present

## 2023-01-25 DIAGNOSIS — G5603 Carpal tunnel syndrome, bilateral upper limbs: Secondary | ICD-10-CM | POA: Diagnosis present

## 2023-01-25 MED ORDER — OXYCODONE-ACETAMINOPHEN 10-325 MG PO TABS: 1.0000 | ORAL_TABLET | Freq: Four times a day (QID) | ORAL | 0 refills | Status: DC | PRN

## 2023-01-25 NOTE — Progress Notes (Signed)
Nursing Pain Medication Assessment:  Safety precautions to be maintained throughout the outpatient stay will include: orient to surroundings, keep bed in low position, maintain call bell within reach at all times, provide assistance with transfer out of bed and ambulation.  Medication Inspection Compliance: Pill count conducted under aseptic conditions, in front of the patient. Neither the pills nor the bottle was removed from the patient's sight at any time. Once count was completed pills were immediately returned to the patient in their original bottle.  Medication: Oxycodone/APAP Pill/Patch Count:  31 of 120 pills remain Pill/Patch Appearance: Markings consistent with prescribed medication Bottle Appearance: Standard pharmacy container. Clearly labeled. Filled Date: 02 / 21 / 2024 Last Medication intake:  TodaySafety precautions to be maintained throughout the outpatient stay will include: orient to surroundings, keep bed in low position, maintain call bell within reach at all times, provide assistance with transfer out of bed and ambulation.

## 2023-01-25 NOTE — Progress Notes (Signed)
PROVIDER NOTE: Information contained herein reflects review and annotations entered in association with encounter. Interpretation of such information and data should be left to medically-trained personnel. Information provided to patient can be located elsewhere in the medical record under "Patient Instructions". Document created using STT-dictation technology, any transcriptional errors that may result from process are unintentional.    Patient: Nathan Ramos  Service Category: E/M  Provider: Gillis Santa, MD  DOB: 1971/08/17  DOS: 01/25/2023  Specialty: Interventional Pain Management  MRN: [id number]  Setting: Ambulatory outpatient  PCP: [facility] [place]  Type: Established Patient    Referring Provider: [facility]  Location: Office  Delivery: Face-to-face     HPI  Nathan Ramos, a 52 y.o. year old male, is here today because of his Chronic pain syndrome [G89.4]. Nathan Ramos primary complain today is Leg Pain (bilateral)  Last encounter: My last encounter with him was on 10/11/22  Pertinent problems: Nathan Ramos has Medullary cystic disease of the kidney; Chronic pain; Gout due to renal impairment; Primary localized osteoarthritis of right knee; Chronic pain syndrome; and Opioid use on their pertinent problem list. Pain Assessment: Severity of Chronic pain is reported as a 6 /10. Location: Leg Right, Left/all over. Onset: More than a month ago. Quality: Constant, Aching, Sharp. Timing: Constant. Modifying factor(s): sleep. Vitals:  height is '5\' 9"'$  (1.753 m) and weight is 255 lb (115.7 kg). His temporal temperature is 98 F (36.7 C). His blood pressure is 170/95 (abnormal) and his pulse is 102 (abnormal). His oxygen saturation is 99%.   Reason for encounter: medication management.    No change in medical history since last visit.  Patient's pain is at baseline.  Patient continues multimodal pain regimen as prescribed.  States that it provides pain relief and  improvement in functional status.    Pharmacotherapy Assessment   10/04/2022 07/18/2022  2 Oxycodone-Acetaminophen 10-325 120.00 30 Bi Lat [id number] Nor (2541) 0/0 60.00 MME Medicare [place]    Pharmacotherapy Assessment  Analgesic: Percocet 10 mg every 6 hours daily as needed, quantity 120/month, MME 60,    Monitoring: [place] PMP: PDMP reviewed during this encounter.       Pharmacotherapy: No side-effects or adverse reactions reported. Compliance: No problems identified. Effectiveness: Clinically acceptable.  UDS:  Summary  Date Value Ref Range Status  01/26/2022 Note  Final    Comment:    ==================================================================== [employer] Select 13 (MW) ==================================================================== Test                             Result       Flag       Units  Drug Present and Declared for Prescription Verification   Oxycodone                      2177         EXPECTED   ng/mg creat   Oxymorphone                    6148         EXPECTED   ng/mg creat   Noroxycodone                   2054         EXPECTED   ng/mg creat   Noroxymorphone                 1030  EXPECTED   ng/mg creat    Sources of oxycodone are scheduled prescription medications.    Oxymorphone, noroxycodone, and noroxymorphone are expected    metabolites of oxycodone. Oxymorphone is also available as a    scheduled prescription medication.  ==================================================================== Test                      Result    Flag   Units      Ref Range   Creatinine              81               mg/dL      >=20 ==================================================================== Declared Medications:  The flagging and interpretation on this report are based on the  following declared medications.  Unexpected results may arise from  inaccuracies in the declared medications.   **Note: The testing scope of this panel includes these  medications:   Oxycodone (Percocet)   **Note: The testing scope of this panel does not include the  following reported medications:   Acetaminophen (Percocet)  Amlodipine (Norvasc)  Atorvastatin (Lipitor)  Colchicine  Febuxostat  Furosemide (Lasix)  Losartan (Cozaar)  Naloxone (Narcan) ==================================================================== For clinical consultation, please call [phone]. ====================================================================       ROS  Constitutional: Denies any fever or chills Gastrointestinal: No reported hemesis, hematochezia, vomiting, or acute GI distress Musculoskeletal: Diffuse arthralgias and myalgias Neurological: No reported episodes of acute onset apraxia, aphasia, dysarthria, agnosia, amnesia, paralysis, loss of coordination, or loss of consciousness  Medication Review  Febuxostat, amLODipine, atorvastatin, colchicine, furosemide, ibuprofen, losartan, naloxone, and oxyCODONE-acetaminophen  History Review  Allergy: Nathan Ramos is allergic to nsaids. Drug: Nathan Ramos  reports no history of drug use. Alcohol:  reports no history of alcohol use. Tobacco:  reports that he has been smoking cigarettes. He has a 11.00 pack-year smoking history. He has never used smokeless tobacco. Social: Nathan Ramos  reports that he has been smoking cigarettes. He has a 11.00 pack-year smoking history. He has never used smokeless tobacco. He reports that he does not drink alcohol and does not use drugs. Medical:  has a past medical history of Anemia, Bone spur of other site, Chronic pain, CKD (chronic kidney disease), Controlled substance agreement signed, Gout, Gouty arthritis, History of YAG laser capsulotomy of lens of left eye, Hyperlipidemia, Hypertension, IFG (impaired fasting glucose), Left knee DJD, Medullary cystic disease of the kidney, Primary localized osteoarthritis of right knee, and Smoker. Surgical: Nathan Ramos  has a past surgical  history that includes Carpal tunnel release (Left); Umbilical hernia repair (2010); Cataract extraction w/ intraocular lens implant (Left, 2008); Cataract extraction w/ intraocular lens implant (Right, 2008); Hernia repair; Total knee arthroplasty (Left, 03/30/2014); Eye surgery; Total knee arthroplasty (Right, 10/26/2014); and Joint replacement (Left, 03/2014). Family: family history includes COPD in his maternal grandmother and mother; Kidney disease in his mother; Stroke (age of onset: 46) in his father; Thyroid disease in his mother.  Laboratory Chemistry Profile   Renal Lab Results  Component Value Date   BUN 47 (H) 06/01/2020   CREATININE 3.06 (H) 06/01/2020   BCR 15 06/01/2020   GFRAA 27 (L) 06/01/2020   GFRNONAA 23 (L) 06/01/2020     Hepatic Lab Results  Component Value Date   AST 15 06/01/2020   ALT 14 10/11/2019   ALBUMIN 4.6 06/01/2020   ALKPHOS 103 06/01/2020     Electrolytes Lab Results  Component Value Date   NA 138 06/01/2020  K 5.7 (H) 06/01/2020   CL 101 06/01/2020   CALCIUM 10.1 06/01/2020     Bone No results found for: "VD25OH", "VD125OH2TOT", "[id number]", "[id number]", "25OHVITD1", "25OHVITD2", "25OHVITD3", "TESTOFREE", "TESTOSTERONE"   Inflammation (CRP: Acute Phase) (ESR: Chronic Phase) No results found for: "CRP", "ESRSEDRATE", "LATICACIDVEN"     Note: Above Lab results reviewed.  Recent Imaging Review  CT 3D INDEPENDENT WKST CLINICAL DATA:  Right calcaneus fracture follow-up.  EXAM: CT OF THE RIGHT FOOT WITHOUT CONTRAST  TECHNIQUE: Multidetector CT imaging of the right foot was performed according to the standard protocol. Multiplanar CT image reconstructions were also generated. 3-dimensional CT images were rendered by post-processing of the original CT data on an acquisition workstation. The 3-dimensional CT images were interpreted and findings were reported in the accompanying complete CT report for this study.  COMPARISON:  Right heel  x-rays dated October 08, 2021.  FINDINGS: Bones/Joint/Cartilage  Subacute to chronic comminuted avulsion fracture of the calcaneal posterior tuberosity with minimal bridging bone. New distraction of the fracture fragments measuring up to 1.2 cm. Nearly healed nondisplaced fracture of the distal fibular metaphysis. No dislocation.  Small unstable osteochondral defect of the lateral talar dome. Age advanced moderate hindfoot and midfoot degenerative changes with joint space narrowing, subchondral cysts, and bulky marginal osteophytes. Moderate tibiotalar joint effusion. Osteopenia.  Ligaments  Ligaments are suboptimally evaluated by CT.  Muscles and Tendons Grossly intact. Thickening of the anterior tibialis and Achilles tendons.  Soft tissue No fluid collection or hematoma.  No soft tissue mass.  IMPRESSION: 1. Subacute to chronic avulsion fracture of the calcaneal posterior tuberosity with minimal bridging bone. New distraction of the fracture fragments measuring up to 1.2 cm. 2. Nearly healed nondisplaced fracture of the distal fibular metaphysis. 3. Small unstable osteochondral defect of the lateral talar dome. 4. Age advanced moderate hindfoot and midfoot degenerative changes.  Electronically Signed   By: Titus Dubin M.D.   On: 11/17/2021 15:40 CT FOOT RIGHT WO CONTRAST CLINICAL DATA:  Right calcaneus fracture follow-up.  EXAM: CT OF THE RIGHT FOOT WITHOUT CONTRAST  TECHNIQUE: Multidetector CT imaging of the right foot was performed according to the standard protocol. Multiplanar CT image reconstructions were also generated. 3-dimensional CT images were rendered by post-processing of the original CT data on an acquisition workstation. The 3-dimensional CT images were interpreted and findings were reported in the accompanying complete CT report for this study.  COMPARISON:  Right heel x-rays dated October 08, 2021.  FINDINGS: Bones/Joint/Cartilage  Subacute to chronic comminuted avulsion fracture of the calcaneal posterior tuberosity with minimal bridging bone. New distraction of the fracture fragments measuring up to 1.2 cm. Nearly healed nondisplaced fracture of the distal fibular metaphysis. No dislocation.  Small unstable osteochondral defect of the lateral talar dome. Age advanced moderate hindfoot and midfoot degenerative changes with joint space narrowing, subchondral cysts, and bulky marginal osteophytes. Moderate tibiotalar joint effusion. Osteopenia.  Ligaments  Ligaments are suboptimally evaluated by CT.  Muscles and Tendons Grossly intact. Thickening of the anterior tibialis and Achilles tendons.  Soft tissue No fluid collection or hematoma.  No soft tissue mass.  IMPRESSION: 1. Subacute to chronic avulsion fracture of the calcaneal posterior tuberosity with minimal bridging bone. New distraction of the fracture fragments measuring up to 1.2 cm. 2. Nearly healed nondisplaced fracture of the distal fibular metaphysis. 3. Small unstable osteochondral defect of the lateral talar dome. 4. Age advanced moderate hindfoot and midfoot degenerative changes.  Electronically Signed   By: Orville Govern.D.  On: 11/17/2021 15:40  Note: Reviewed        Physical Exam  General appearance: alert, cooperative, and in mild distress presents today in wheelchair Mental status: Alert, oriented x 3 (person, place, & time)       Respiratory: No evidence of acute respiratory distress Eyes: PERLA Vitals: BP (!) 170/95 (BP Location: Right Arm, Patient Position: Sitting, Cuff Size: Large) Comment: Dr Holley Raring notified and instructed to follow up with PCP  Pulse (!) 102   Temp 98 F (36.7 C) (Temporal)   Ht '5\' 9"'$  (1.753 m)   Wt 255 lb (115.7 kg)   SpO2 99%   BMI 37.66 kg/m  BMI: Estimated body mass index is 37.66 kg/m as calculated from the following:   Height as of this  encounter: '5\' 9"'$  (1.753 m).   Weight as of this encounter: 255 lb (115.7 kg). Ideal: Ideal body weight: 70.7 kg (155 lb 13.8 oz) Adjusted ideal body weight: 88.7 kg (195 lb 8.3 oz)  Cervicalgia Bilatral knee pain   Assessment   Status Diagnosis  Controlled Controlled Controlled 1. Chronic pain syndrome   2. History of bilateral knee replacement   3. Bilateral carpal tunnel syndrome   4. 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 Right hand pain   6. Primary osteoarthritis of both knees   7. Medullary cystic disease of the kidney   8. Right wrist pain           Plan of Care   Nathan Ramos has a current medication list which includes the following long-term medication(s): amlodipine, atorvastatin, furosemide, losartan, colchicine, [START ON 02/02/2023] oxycodone-acetaminophen, [START ON 03/04/2023] oxycodone-acetaminophen, and [START ON 04/03/2023] oxycodone-acetaminophen.   Pharmacotherapy (Medications Ordered): Meds ordered this encounter  Medications   oxyCODONE-acetaminophen (PERCOCET) 10-325 MG tablet    Sig: Take 1 tablet by mouth every 6 (six) hours as needed for pain. Must last 30 days.    Dispense:  120 tablet    Refill:  0    [place] STOP ACT - Not applicable. Fill one day early if pharmacy is closed on scheduled refill date.   oxyCODONE-acetaminophen (PERCOCET) 10-325 MG tablet    Sig: Take 1 tablet by mouth every 6 (six) hours as needed for pain. Must last 30 days.    Dispense:  120 tablet    Refill:  0    [place] STOP ACT - Not applicable. Fill one day early if pharmacy is closed on scheduled refill date.   oxyCODONE-acetaminophen (PERCOCET) 10-325 MG tablet    Sig: Take 1 tablet by mouth every 6 (six) hours as needed for pain. Must last 30 days.    Dispense:  120 tablet    Refill:  0    [place] STOP ACT - Not applicable. Fill one day early if pharmacy is closed on scheduled refill date.    Follow-up plan:   Return in about 3 months  (around 05/01/2023) for Medication Management, in person.   Recent Visits No visits were found meeting these conditions. Showing recent visits within past 90 days and meeting all other requirements Today's Visits Date Type Provider Dept  01/25/23 Office Visit Gillis Santa, MD [facility]  Showing today's visits and meeting all other requirements Future Appointments No visits were found meeting these conditions. Showing future appointments within next 90 days and meeting all other requirements  I discussed the assessment and treatment plan with the patient. The patient was provided an opportunity to  ask questions and all were answered. The patient agreed with the plan and demonstrated an understanding of the instructions.  Patient advised to call back or seek an in-person evaluation if the symptoms or condition worsens.  Duration of encounter: 30 minutes.  Note by: Gillis Santa, MD Date: 01/25/2023; Time: 8:49 AM

## 2023-01-31 LAB — TOXASSURE SELECT 13 (MW), URINE

## 2023-04-12 ENCOUNTER — Other Ambulatory Visit: Payer: Self-pay

## 2023-04-12 ENCOUNTER — Encounter
Admission: EM | Disposition: A | Discharge status: Home / Self Care | Payer: Self-pay | Source: Home / Self Care | Attending: Obstetrics and Gynecology

## 2023-04-12 ENCOUNTER — Inpatient Hospital Stay
Admission: EM | Admit: 2023-04-12 | Discharge: 2023-04-14 | DRG: 322 | Disposition: A | Discharge status: Home / Self Care | Payer: Medicare HMO | Attending: Family Medicine | Admitting: Family Medicine

## 2023-04-12 ENCOUNTER — Inpatient Hospital Stay: Payer: Medicare HMO

## 2023-04-12 DIAGNOSIS — I213 ST elevation (STEMI) myocardial infarction of unspecified site: Principal | ICD-10-CM | POA: Diagnosis present

## 2023-04-12 DIAGNOSIS — M109 Gout, unspecified: Secondary | ICD-10-CM | POA: Diagnosis present

## 2023-04-12 DIAGNOSIS — Z961 Presence of intraocular lens: Secondary | ICD-10-CM | POA: Diagnosis present

## 2023-04-12 DIAGNOSIS — E782 Mixed hyperlipidemia: Secondary | ICD-10-CM | POA: Diagnosis not present

## 2023-04-12 DIAGNOSIS — D631 Anemia in chronic kidney disease: Secondary | ICD-10-CM | POA: Diagnosis present

## 2023-04-12 DIAGNOSIS — Z720 Tobacco use: Secondary | ICD-10-CM | POA: Diagnosis not present

## 2023-04-12 DIAGNOSIS — I129 Hypertensive chronic kidney disease with stage 1 through stage 4 chronic kidney disease, or unspecified chronic kidney disease: Secondary | ICD-10-CM | POA: Diagnosis present

## 2023-04-12 DIAGNOSIS — I2111 ST elevation (STEMI) myocardial infarction involving right coronary artery: Principal | ICD-10-CM | POA: Diagnosis present

## 2023-04-12 DIAGNOSIS — Z96653 Presence of artificial knee joint, bilateral: Secondary | ICD-10-CM | POA: Diagnosis present

## 2023-04-12 DIAGNOSIS — Z8349 Family history of other endocrine, nutritional and metabolic diseases: Secondary | ICD-10-CM | POA: Diagnosis not present

## 2023-04-12 DIAGNOSIS — E669 Obesity, unspecified: Secondary | ICD-10-CM | POA: Diagnosis not present

## 2023-04-12 DIAGNOSIS — M1A9XX0 Chronic gout, unspecified, without tophus (tophi): Secondary | ICD-10-CM | POA: Diagnosis not present

## 2023-04-12 DIAGNOSIS — R0789 Other chest pain: Secondary | ICD-10-CM | POA: Diagnosis not present

## 2023-04-12 DIAGNOSIS — Z79899 Other long term (current) drug therapy: Secondary | ICD-10-CM | POA: Diagnosis not present

## 2023-04-12 DIAGNOSIS — Z825 Family history of asthma and other chronic lower respiratory diseases: Secondary | ICD-10-CM

## 2023-04-12 DIAGNOSIS — G8929 Other chronic pain: Secondary | ICD-10-CM | POA: Diagnosis present

## 2023-04-12 DIAGNOSIS — Z716 Tobacco abuse counseling: Secondary | ICD-10-CM

## 2023-04-12 DIAGNOSIS — F17210 Nicotine dependence, cigarettes, uncomplicated: Secondary | ICD-10-CM | POA: Diagnosis present

## 2023-04-12 DIAGNOSIS — N184 Chronic kidney disease, stage 4 (severe): Secondary | ICD-10-CM | POA: Diagnosis not present

## 2023-04-12 DIAGNOSIS — E875 Hyperkalemia: Secondary | ICD-10-CM | POA: Diagnosis not present

## 2023-04-12 DIAGNOSIS — Q613 Polycystic kidney, unspecified: Secondary | ICD-10-CM

## 2023-04-12 DIAGNOSIS — R001 Bradycardia, unspecified: Secondary | ICD-10-CM | POA: Diagnosis not present

## 2023-04-12 DIAGNOSIS — Z9842 Cataract extraction status, left eye: Secondary | ICD-10-CM | POA: Diagnosis not present

## 2023-04-12 DIAGNOSIS — E785 Hyperlipidemia, unspecified: Secondary | ICD-10-CM | POA: Diagnosis not present

## 2023-04-12 DIAGNOSIS — E872 Acidosis, unspecified: Secondary | ICD-10-CM | POA: Diagnosis not present

## 2023-04-12 DIAGNOSIS — Q615 Medullary cystic kidney: Secondary | ICD-10-CM | POA: Diagnosis not present

## 2023-04-12 DIAGNOSIS — M170 Bilateral primary osteoarthritis of knee: Secondary | ICD-10-CM | POA: Diagnosis present

## 2023-04-12 DIAGNOSIS — I2510 Atherosclerotic heart disease of native coronary artery without angina pectoris: Secondary | ICD-10-CM | POA: Diagnosis not present

## 2023-04-12 DIAGNOSIS — Z823 Family history of stroke: Secondary | ICD-10-CM

## 2023-04-12 DIAGNOSIS — Z841 Family history of disorders of kidney and ureter: Secondary | ICD-10-CM

## 2023-04-12 DIAGNOSIS — I10 Essential (primary) hypertension: Secondary | ICD-10-CM | POA: Diagnosis not present

## 2023-04-12 DIAGNOSIS — G894 Chronic pain syndrome: Secondary | ICD-10-CM | POA: Diagnosis not present

## 2023-04-12 DIAGNOSIS — R9431 Abnormal electrocardiogram [ECG] [EKG]: Secondary | ICD-10-CM | POA: Diagnosis not present

## 2023-04-12 DIAGNOSIS — N183 Chronic kidney disease, stage 3 unspecified: Secondary | ICD-10-CM | POA: Diagnosis present

## 2023-04-12 DIAGNOSIS — Z9841 Cataract extraction status, right eye: Secondary | ICD-10-CM | POA: Diagnosis not present

## 2023-04-12 DIAGNOSIS — I2119 ST elevation (STEMI) myocardial infarction involving other coronary artery of inferior wall: Secondary | ICD-10-CM | POA: Diagnosis not present

## 2023-04-12 DIAGNOSIS — R079 Chest pain, unspecified: Secondary | ICD-10-CM | POA: Diagnosis not present

## 2023-04-12 HISTORY — PX: CORONARY/GRAFT ACUTE MI REVASCULARIZATION: CATH118305

## 2023-04-12 HISTORY — PX: LEFT HEART CATH AND CORONARY ANGIOGRAPHY: CATH118249

## 2023-04-12 LAB — CBC
HCT: 31.2 % — ABNORMAL LOW (ref 39.0–52.0)
Hemoglobin: 10.2 g/dL — ABNORMAL LOW (ref 13.0–17.0)
MCH: 29 pg (ref 26.0–34.0)
MCHC: 32.7 g/dL (ref 30.0–36.0)
MCV: 88.6 fL (ref 80.0–100.0)
Platelets: 281 10*3/uL (ref 150–400)
RBC: 3.52 MIL/uL — ABNORMAL LOW (ref 4.22–5.81)
RDW: 13.2 % (ref 11.5–15.5)
WBC: 10.9 10*3/uL — ABNORMAL HIGH (ref 4.0–10.5)
nRBC: 0 % (ref 0.0–0.2)

## 2023-04-12 LAB — COMPREHENSIVE METABOLIC PANEL
ALT: 20 U/L (ref 0–44)
AST: 70 U/L — ABNORMAL HIGH (ref 15–41)
Albumin: 4.1 g/dL (ref 3.5–5.0)
Alkaline Phosphatase: 122 U/L (ref 38–126)
Anion gap: 9 (ref 5–15)
BUN: 55 mg/dL — ABNORMAL HIGH (ref 6–20)
CO2: 20 mmol/L — ABNORMAL LOW (ref 22–32)
Calcium: 8.7 mg/dL — ABNORMAL LOW (ref 8.9–10.3)
Chloride: 105 mmol/L (ref 98–111)
Creatinine, Ser: 3.61 mg/dL — ABNORMAL HIGH (ref 0.61–1.24)
GFR, Estimated: 20 mL/min — ABNORMAL LOW (ref >60)
Glucose, Bld: 179 mg/dL — ABNORMAL HIGH (ref 70–99)
Potassium: 5 mmol/L (ref 3.5–5.1)
Sodium: 134 mmol/L — ABNORMAL LOW (ref 135–145)
Total Bilirubin: 0.4 mg/dL (ref 0.3–1.2)
Total Protein: 7.5 g/dL (ref 6.5–8.1)

## 2023-04-12 LAB — TROPONIN I (HIGH SENSITIVITY): Troponin I (High Sensitivity): 20537 ng/L (ref <18)

## 2023-04-12 LAB — MRSA NEXT GEN BY PCR, NASAL: MRSA by PCR Next Gen: NOT DETECTED

## 2023-04-12 SURGERY — CORONARY/GRAFT ACUTE MI REVASCULARIZATION
Anesthesia: Moderate Sedation

## 2023-04-12 MED ORDER — HEPARIN SODIUM (PORCINE) 1000 UNIT/ML IJ SOLN
6000.0000 [IU] | Freq: Once | INTRAMUSCULAR | Status: AC
  Administered 2023-04-12: 6000 [IU] via INTRAVENOUS

## 2023-04-12 MED ORDER — VERAPAMIL HCL 2.5 MG/ML IV SOLN
INTRAVENOUS | Status: DC | PRN
  Administered 2023-04-12: 2.5 mg via INTRAVENOUS

## 2023-04-12 MED ORDER — IOHEXOL 300 MG/ML  SOLN
INTRAMUSCULAR | Status: DC | PRN
  Administered 2023-04-12: 270 mL

## 2023-04-12 MED ORDER — ATORVASTATIN CALCIUM 20 MG PO TABS
80.0000 mg | ORAL_TABLET | Freq: Every day | ORAL | Status: DC
  Administered 2023-04-12 – 2023-04-14 (×3): 80 mg via ORAL
  Filled 2023-04-12 (×3): qty 4

## 2023-04-12 MED ORDER — NITROGLYCERIN IN D5W 200-5 MCG/ML-% IV SOLN
INTRAVENOUS | Status: AC
  Filled 2023-04-12: qty 250

## 2023-04-12 MED ORDER — NICOTINE POLACRILEX 2 MG MT GUM: 2.0000 mg | CHEWING_GUM | OROMUCOSAL | Status: DC | PRN

## 2023-04-12 MED ORDER — LOSARTAN POTASSIUM 50 MG PO TABS
50.0000 mg | ORAL_TABLET | Freq: Every day | ORAL | Status: DC
  Administered 2023-04-13: 50 mg via ORAL
  Filled 2023-04-12 (×2): qty 1

## 2023-04-12 MED ORDER — ONDANSETRON HCL 4 MG/2ML IJ SOLN: 4.0000 mg | Freq: Four times a day (QID) | INTRAMUSCULAR | Status: DC | PRN

## 2023-04-12 MED ORDER — ACETAMINOPHEN 325 MG PO TABS
650.0000 mg | ORAL_TABLET | ORAL | Status: DC | PRN
  Administered 2023-04-12 – 2023-04-14 (×5): 650 mg via ORAL
  Filled 2023-04-12 (×5): qty 2

## 2023-04-12 MED ORDER — FENTANYL CITRATE (PF) 100 MCG/2ML IJ SOLN
INTRAMUSCULAR | Status: DC | PRN
  Administered 2023-04-12 (×2): 25 ug via INTRAVENOUS
  Administered 2023-04-12: 50 ug via INTRAVENOUS

## 2023-04-12 MED ORDER — BIVALIRUDIN BOLUS VIA INFUSION - CUPID
INTRAVENOUS | Status: DC | PRN
  Administered 2023-04-12: 87 mg via INTRAVENOUS

## 2023-04-12 MED ORDER — ASPIRIN 81 MG PO CHEW
81.0000 mg | CHEWABLE_TABLET | Freq: Every day | ORAL | Status: DC
  Administered 2023-04-13 – 2023-04-14 (×2): 81 mg via ORAL
  Filled 2023-04-12 (×2): qty 1

## 2023-04-12 MED ORDER — FENTANYL CITRATE (PF) 100 MCG/2ML IJ SOLN
INTRAMUSCULAR | Status: AC
  Filled 2023-04-12: qty 2

## 2023-04-12 MED ORDER — OXYCODONE HCL 5 MG PO TABS
10.0000 mg | ORAL_TABLET | Freq: Four times a day (QID) | ORAL | Status: DC | PRN
  Administered 2023-04-13 – 2023-04-14 (×6): 10 mg via ORAL
  Filled 2023-04-12 (×6): qty 2

## 2023-04-12 MED ORDER — SODIUM CHLORIDE 0.9% FLUSH
3.0000 mL | Freq: Two times a day (BID) | INTRAVENOUS | Status: DC
  Administered 2023-04-13 – 2023-04-14 (×3): 3 mL via INTRAVENOUS

## 2023-04-12 MED ORDER — MIDAZOLAM HCL 2 MG/2ML IJ SOLN
INTRAMUSCULAR | Status: AC
  Filled 2023-04-12: qty 2

## 2023-04-12 MED ORDER — OXYCODONE HCL 5 MG PO TABS
5.0000 mg | ORAL_TABLET | ORAL | Status: DC | PRN
  Administered 2023-04-12: 10 mg via ORAL
  Filled 2023-04-12: qty 2

## 2023-04-12 MED ORDER — LISINOPRIL 10 MG PO TABS: 5.0000 mg | ORAL_TABLET | Freq: Every day | ORAL | Status: DC

## 2023-04-12 MED ORDER — TICAGRELOR 90 MG PO TABS
90.0000 mg | ORAL_TABLET | Freq: Two times a day (BID) | ORAL | Status: DC
  Administered 2023-04-13 – 2023-04-14 (×3): 90 mg via ORAL
  Filled 2023-04-12 (×3): qty 1

## 2023-04-12 MED ORDER — DIAZEPAM 5 MG PO TABS: 5.0000 mg | ORAL_TABLET | Freq: Four times a day (QID) | ORAL | Status: DC | PRN

## 2023-04-12 MED ORDER — LIDOCAINE HCL (PF) 1 % IJ SOLN
INTRAMUSCULAR | Status: DC | PRN
  Administered 2023-04-12: 2 mL
  Administered 2023-04-12: 10 mL

## 2023-04-12 MED ORDER — ZOLPIDEM TARTRATE 5 MG PO TABS
5.0000 mg | ORAL_TABLET | Freq: Every evening | ORAL | Status: DC | PRN
  Administered 2023-04-12: 5 mg via ORAL
  Filled 2023-04-12: qty 1

## 2023-04-12 MED ORDER — TICAGRELOR 90 MG PO TABS
180.0000 mg | ORAL_TABLET | Freq: Once | ORAL | Status: AC
  Administered 2023-04-12: 180 mg via ORAL

## 2023-04-12 MED ORDER — METOPROLOL TARTRATE 25 MG PO TABS
25.0000 mg | ORAL_TABLET | Freq: Two times a day (BID) | ORAL | Status: DC
  Administered 2023-04-12 – 2023-04-14 (×4): 25 mg via ORAL
  Filled 2023-04-12 (×4): qty 1

## 2023-04-12 MED ORDER — BIVALIRUDIN TRIFLUOROACETATE 250 MG IV SOLR
INTRAVENOUS | Status: AC
  Filled 2023-04-12: qty 250

## 2023-04-12 MED ORDER — FEBUXOSTAT 40 MG PO TABS
80.0000 mg | ORAL_TABLET | ORAL | Status: DC
  Administered 2023-04-13: 80 mg via ORAL
  Filled 2023-04-12: qty 2

## 2023-04-12 MED ORDER — SODIUM CHLORIDE 0.9% FLUSH: 3.0000 mL | INTRAVENOUS | Status: DC | PRN

## 2023-04-12 MED ORDER — NITROGLYCERIN IN D5W 200-5 MCG/ML-% IV SOLN
INTRAVENOUS | Status: AC | PRN
  Administered 2023-04-12: 10 ug/min via INTRAVENOUS

## 2023-04-12 MED ORDER — HEPARIN (PORCINE) IN NACL 1000-0.9 UT/500ML-% IV SOLN
INTRAVENOUS | Status: DC | PRN
  Administered 2023-04-12 (×2): 500 mL

## 2023-04-12 MED ORDER — SODIUM CHLORIDE 0.9 % IV SOLN: 250.0000 mL | INTRAVENOUS | Status: DC | PRN

## 2023-04-12 MED ORDER — OXYCODONE-ACETAMINOPHEN 10-325 MG PO TABS: 1.0000 | ORAL_TABLET | Freq: Four times a day (QID) | ORAL | Status: DC | PRN

## 2023-04-12 MED ORDER — SODIUM CHLORIDE 0.9 % WEIGHT BASED INFUSION
1.0000 mL/kg/h | INTRAVENOUS | Status: AC
  Administered 2023-04-12 – 2023-04-13 (×2): 1 mL/kg/h via INTRAVENOUS

## 2023-04-12 MED ORDER — CHLORHEXIDINE GLUCONATE CLOTH 2 % EX PADS
6.0000 | MEDICATED_PAD | Freq: Every day | CUTANEOUS | Status: DC
  Administered 2023-04-12 – 2023-04-13 (×2): 6 via TOPICAL

## 2023-04-12 MED ORDER — MORPHINE SULFATE (PF) 4 MG/ML IV SOLN
INTRAVENOUS | Status: AC
  Filled 2023-04-12: qty 1

## 2023-04-12 MED ORDER — MORPHINE SULFATE (PF) 4 MG/ML IV SOLN
INTRAVENOUS | Status: DC | PRN
  Administered 2023-04-12: 2 mg via INTRAVENOUS

## 2023-04-12 MED ORDER — ENOXAPARIN SODIUM 60 MG/0.6ML IJ SOSY
60.0000 mg | PREFILLED_SYRINGE | INTRAMUSCULAR | Status: DC
  Administered 2023-04-13: 60 mg via SUBCUTANEOUS
  Filled 2023-04-12 (×2): qty 0.6

## 2023-04-12 MED ORDER — HYDRALAZINE HCL 20 MG/ML IJ SOLN: 10.0000 mg | INTRAMUSCULAR | Status: AC | PRN

## 2023-04-12 MED ORDER — SODIUM CHLORIDE 0.9% FLUSH
3.0000 mL | Freq: Two times a day (BID) | INTRAVENOUS | Status: DC
  Administered 2023-04-12 – 2023-04-14 (×4): 3 mL via INTRAVENOUS

## 2023-04-12 MED ORDER — ACETAMINOPHEN 325 MG PO TABS
325.0000 mg | ORAL_TABLET | Freq: Four times a day (QID) | ORAL | Status: DC | PRN
  Administered 2023-04-13: 325 mg via ORAL
  Filled 2023-04-12 (×2): qty 1

## 2023-04-12 MED ORDER — HEPARIN SODIUM (PORCINE) 1000 UNIT/ML IJ SOLN
INTRAMUSCULAR | Status: AC
  Filled 2023-04-12: qty 10

## 2023-04-12 MED ORDER — NICOTINE 21 MG/24HR TD PT24
21.0000 mg | MEDICATED_PATCH | Freq: Every day | TRANSDERMAL | Status: DC
  Administered 2023-04-12 – 2023-04-14 (×3): 21 mg via TRANSDERMAL
  Filled 2023-04-12 (×3): qty 1

## 2023-04-12 MED ORDER — MIDAZOLAM HCL 2 MG/2ML IJ SOLN
INTRAMUSCULAR | Status: DC | PRN
  Administered 2023-04-12 (×4): 1 mg via INTRAVENOUS

## 2023-04-12 MED ORDER — LABETALOL HCL 5 MG/ML IV SOLN: 10.0000 mg | INTRAVENOUS | Status: AC | PRN

## 2023-04-12 MED ORDER — HEPARIN (PORCINE) IN NACL 1000-0.9 UT/500ML-% IV SOLN
INTRAVENOUS | Status: AC
  Filled 2023-04-12: qty 1000

## 2023-04-12 MED ORDER — VERAPAMIL HCL 2.5 MG/ML IV SOLN
INTRAVENOUS | Status: AC
  Filled 2023-04-12: qty 2

## 2023-04-12 MED ORDER — SODIUM CHLORIDE 0.9 % IV SOLN
INTRAVENOUS | Status: AC | PRN
  Administered 2023-04-12: .25 mg/kg/h via INTRAVENOUS

## 2023-04-12 MED ORDER — SODIUM CHLORIDE 0.9 % IV SOLN
0.2500 mg/kg/h | INTRAVENOUS | Status: AC
  Filled 2023-04-12: qty 250

## 2023-04-12 MED ORDER — ASPIRIN 81 MG PO CHEW
324.0000 mg | CHEWABLE_TABLET | Freq: Once | ORAL | Status: AC
  Administered 2023-04-12: 324 mg via ORAL

## 2023-04-12 SURGICAL SUPPLY — 25 items
BALLN TREK RX 2.5X15 (BALLOONS) ×1
BALLOON TREK RX 2.5X15 (BALLOONS) IMPLANT
CATH INFINITI 5 FR JL3.5 (CATHETERS) IMPLANT
CATH VISTA GUIDE 6FR JR4 (CATHETERS) IMPLANT
CATH VISTA GUIDE 6FR XB3.5 (CATHETERS) IMPLANT
DEVICE CLOSURE MYNXGRIP 6/7F (Vascular Products) IMPLANT
DEVICE RAD COMP TR BAND LRG (VASCULAR PRODUCTS) IMPLANT
DEVICE RAD TR BAND REGULAR (VASCULAR PRODUCTS) IMPLANT
DRAPE BRACHIAL (DRAPES) IMPLANT
GLIDESHEATH SLEND SS 6F .021 (SHEATH) IMPLANT
GUIDEWIRE INQWIRE 1.5J.035X260 (WIRE) IMPLANT
INQWIRE 1.5J .035X260CM (WIRE) ×1
KIT ENCORE 26 ADVANTAGE (KITS) IMPLANT
NDL PERC 18GX7CM (NEEDLE) IMPLANT
NEEDLE PERC 18GX7CM (NEEDLE) ×1 IMPLANT
PACK CARDIAC CATH (CUSTOM PROCEDURE TRAY) ×2 IMPLANT
PROTECTION STATION PRESSURIZED (MISCELLANEOUS) ×1
SET ATX-X65L (MISCELLANEOUS) IMPLANT
SHEATH AVANTI 6FR X 11CM (SHEATH) IMPLANT
STATION PROTECTION PRESSURIZED (MISCELLANEOUS) IMPLANT
STENT ONYX FRONTIER 3.0X15 (Permanent Stent) IMPLANT
TUBING CIL FLEX 10 FLL-RA (TUBING) IMPLANT
WIRE G HI TQ BMW 190 (WIRE) IMPLANT
WIRE GUIDERIGHT .035X150 (WIRE) IMPLANT
WIRE HITORQ VERSACORE ST 145CM (WIRE) IMPLANT

## 2023-04-12 NOTE — ED Notes (Signed)
Pt to cath lab at this time with ED staff and MD [facility].  Pt placed on pacer pads and zoll monitor prior to transport to cath lab.

## 2023-04-12 NOTE — ED Triage Notes (Signed)
Pt presents to ER via [facility] from home with c/o chest pain that started this afternoon while weed eating.  Pt states pain is in left side of chest and feels like indigestion.  Pt diaphoretic on arrival.  Pt is otherwise A&O x4.  No prior hx of MI.

## 2023-04-12 NOTE — Assessment & Plan Note (Signed)
Hold patient amlodipine with plan to allow room for metoprolol.

## 2023-04-12 NOTE — Consult Note (Signed)
CARDIOLOGY CONSULT NOTE               Patient ID: TRENNEN MASTROMARINO MRN: [id number] DOB/AGE: 50-Feb-1972 50 y.o.  Admit date: 04/12/2023 Referring Physician Dr. Rosalia Hammers emergency room Primary Physician  Primary Cardiologist  Reason for Consultation STEMI  HPI: Patient is a 52 year old obese white male history of DJD hypertension chronic pain presented with acute onset of chest discomfort while mowing the grass patient states pain occurred about 45 minutes prior to presentation and called rescue from [facility] which took about 30 minutes to get to the emergency room he was found to have persistent ST elevation inferiorly reciprocal depressions laterally pain was 10 out of 10 he was hemodynamically stable but code STEMI had been initiated patient was given heparin aspirin given 180 mg of Brilinta prior to the catheterization  Review of systems complete and found to be negative unless listed above     Past Medical History:  Diagnosis Date   Anemia    of chronic renal disease   Bone spur of other site    right shoulder, managed by ortho   Chronic pain    [name] CFNP at [facility]   CKD (chronic kidney disease)    stage III 03/2014 (Dr. 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 Dr Mosetta Pigeon   Primary localized osteoarthritis of right knee    Smoker     Past Surgical History:  Procedure Laterality Date   CARPAL TUNNEL RELEASE Left    CATARACT EXTRACTION W/ INTRAOCULAR LENS IMPLANT Left 2008   CATARACT EXTRACTION W/ INTRAOCULAR LENS IMPLANT Right 2008   EYE SURGERY     HERNIA REPAIR     JOINT REPLACEMENT Left 03/2014   TOTAL KNEE ARTHROPLASTY Left 03/30/2014   Procedure:  TOTAL KNEE ARTHROPLASTY;  Surgeon: Nilda Simmer, MD;  Location: [facility] OR;  Service: Orthopedics;  Laterality: Left;   TOTAL KNEE ARTHROPLASTY Right 10/26/2014   Procedure: TOTAL KNEE ARTHROPLASTY;  Surgeon: Nilda Simmer, MD;  Location: [facility] OR;  Service: Orthopedics;  Laterality: Right;   UMBILICAL HERNIA REPAIR  2010    Medications Prior to Admission  Medication Sig Dispense Refill Last Dose   amLODipine (NORVASC) 10 MG tablet Take 1 tablet (10 mg total) by mouth daily. 90 tablet 1    atorvastatin (LIPITOR) 40 MG tablet Take 1 tablet (40 mg total) by mouth at bedtime. For cholesterol 30 tablet 5    colchicine 0.6 MG tablet Take 1 tablet (0.6 mg total) by mouth daily. 30 tablet 1    Febuxostat 80 MG TABS Take 1 tablet by mouth every other day.       furosemide (LASIX) 40 MG tablet Take 1 tablet (40 mg total) by mouth every other day. 45 tablet 1    ibuprofen (ADVIL) 200 MG tablet Take 200 mg by mouth every 4 (four) hours as needed for moderate pain.      losartan (COZAAR) 50 MG tablet TAKE 1 TABLET BY MOUTH EVERY DAY 90 tablet 0    naloxone (NARCAN) nasal spray 4 mg/0.1 mL Use in case of  accidental overdose with narcotics / opioids 1 kit 1    oxyCODONE-acetaminophen (PERCOCET) 10-325 MG tablet Take 1 tablet by mouth every 6 (six) hours as needed for pain. Must last 30 days. 120 tablet 0    oxyCODONE-acetaminophen (PERCOCET) 10-325 MG tablet Take 1 tablet by mouth every 6 (six) hours as needed for pain. Must last 30 days. 120 tablet 0    oxyCODONE-acetaminophen (PERCOCET) 10-325 MG tablet Take 1 tablet by mouth every 6 (six) hours as needed for pain. Must last 30 days. 120 tablet 0    Social History   Socioeconomic History   Marital status: Married    Spouse name: Not on file   Number of children: 6   Years of education: Not on file   Highest education level: Bachelor's degree (e.g., BA, AB, BS)  Occupational History   Not on file  Tobacco Use   Smoking status: Every Day    Packs/day:  0.50    Years: 22.00    Additional pack years: 0.00    Total pack years: 11.00    Types: Cigarettes   Smokeless tobacco: Never  Vaping Use   Vaping Use: Never used  Substance and Sexual Activity   Alcohol use: No   Drug use: No   Sexual activity: Yes  Other Topics Concern   Not on file  Social History Narrative   Not on file   Social Determinants of Health   Financial Resource Strain: Low Risk  (01/16/2019)   Overall Financial Resource Strain (CARDIA)    Difficulty of Paying Living Expenses: Not hard at all  Food Insecurity: No Food Insecurity (01/16/2019)   Hunger Vital Sign    Worried About Running Out of Food in the Last Year: Never true    Ran Out of Food in the Last Year: Never true  Transportation Needs: No Transportation Needs (01/16/2019)   PRAPARE - Administrator, Civil Service (Medical): No    Lack of Transportation (Non-Medical): No  Physical Activity: Inactive (01/16/2019)   Exercise Vital Sign    Days of Exercise per Week: 0 days    Minutes of Exercise per Session: 0 min  Stress: No Stress Concern Present (01/16/2019)   [employer] of Occupational Health - Occupational Stress Questionnaire    Feeling of Stress : Not at all  Social Connections: Somewhat Isolated (01/16/2019)   Social Connection and Isolation Panel [NHANES]    Frequency of Communication with Friends and Family: More than three times a week    Frequency of Social Gatherings with Friends and Family: More than three times a week    Attends Religious Services: Never    Database administrator or Organizations: No    Attends Banker Meetings: Never    Marital Status: Married  Catering manager Violence: Not At Risk (01/16/2019)   Humiliation, Afraid, Rape, and Kick questionnaire    Fear of Current or Ex-Partner: No    Emotionally Abused: No    Physically Abused: No    Sexually Abused: No    Family History  Problem Relation Age of Onset   COPD Mother    Kidney disease Mother     Thyroid disease Mother    Stroke Father 60   COPD Maternal Grandmother       Review of systems complete and found to be negative unless listed above      PHYSICAL EXAM  General: Well developed, well nourished, in no acute distress HEENT:  Normocephalic and atramatic  Neck:  No JVD.  Lungs: Clear bilaterally to auscultation and percussion. Heart: HRRR . Normal S1 and S2 without gallops or murmurs.  Abdomen: Bowel sounds are positive, abdomen soft and non-tender  Msk:  Back normal, normal gait. Normal strength and tone for age. Extremities: No clubbing, cyanosis or edema.   Neuro: Alert and oriented X 3. Psych:  Good affect, responds appropriately  Labs:   Lab Results  Component Value Date   WBC 10.3 06/01/2020   HGB 11.1 (L) 06/01/2020   HCT 34.4 (L) 06/01/2020   MCV 86 06/01/2020   PLT 369 06/01/2020   No results for input(s): "NA", "K", "CL", "CO2", "BUN", "CREATININE", "CALCIUM", "PROT", "BILITOT", "ALKPHOS", "ALT", "AST", "GLUCOSE" in the last 168 hours.  Invalid input(s): "LABALBU" No results found for: "CKTOTAL", "CKMB", "CKMBINDEX", "TROPONINI"  Lab Results  Component Value Date   CHOL 179 10/23/2018   CHOL 206 (H) 04/23/2018   CHOL 139 10/16/2017   Lab Results  Component Value Date   HDL 26 (L) 10/23/2018   HDL 37 (L) 04/23/2018   HDL 23 (L) 10/16/2017   Lab Results  Component Value Date   [facility]  10/23/2018     Comment:     . LDL cholesterol not calculated. Triglyceride levels greater than 400 mg/dL invalidate calculated LDL results. . Reference range: <100 . Desirable range <100 mg/dL for primary prevention;   <70 mg/dL for patients with CHD or diabetic patients  with > or = 2 CHD risk factors. Marland Kitchen LDL-C is now calculated using the Martin-Hopkins  calculation, which is a validated novel method providing  better accuracy than the Friedewald equation in the  estimation of LDL-C.  Horald Pollen et al. Lenox Ahr. 1610;960(45): 2061-2068   (http://education.[facility].com/faq/FAQ164)    LDLCALC 115 (H) 04/23/2018   LDLCALC 40 10/16/2017   Lab Results  Component Value Date   TRIG 481 (H) 10/23/2018   TRIG 380 (H) 04/23/2018   TRIG 382 (H) 10/16/2017   Lab Results  Component Value Date   CHOLHDL 6.9 (H) 10/23/2018   CHOLHDL 5.6 (H) 04/23/2018   CHOLHDL 6.0 (H) 10/16/2017   No results found for: "LDLDIRECT"    Radiology: No results found.  EKG: ST elevation inferiorly reciprocal depressions laterally rate of 75  ASSESSMENT AND PLAN:  Inferior STEMI Obesity Chronic pain Hypertension Coronary artery disease Hyperlipidemia Smoking Possible obstructive sleep apnea DJD  Plan Agree with admit for acute myocardial infarction Proceed with emergent cardiac cath PCI and stent Maintain aspirin Brilinta statin therapy Adjust blood pressure medications to include beta-blocker ACE inhibitor in place of amlodipine High-dose statin therapy with Lipitor Follow-up troponins and EKGs Echocardiogram in the morning for evaluation of left ventricular function wall motion Advised patient refrain from tobacco abuse Will transfer cardiac care to [facility] in the morning since patient is unassigned  Signed: [name] MD 04/12/2023, 10:02 PM

## 2023-04-12 NOTE — Assessment & Plan Note (Signed)
Advised patient to quit smoking, further education to be provided by RN.  Will order nicotine replacement therapy

## 2023-04-12 NOTE — H&P (Signed)
History and Physical    Patient: Nathan Ramos ZOX:[id number] DOB: 1971/07/01 DOA: 04/12/2023 DOS: the patient was seen and examined on 04/12/2023 PCP: [facility]  Patient coming from: Home  Chief Complaint:  Chief Complaint  Patient presents with   Code STEMI        HPI: Nathan Ramos is a 52 y.o. male with medical history significant of chronic gout, chronic pain.  Patient was in his usual state of health till earlier this afternoon when patient was at home and in his backyard and patient had sudden onset of left anterior chest pain as if a "vice had hit him ".  It was crushing, no aggravating relieving factors no radiation.  Associated with fatigue, feeling of doom, some sweating.  No shortness of breath no palpitation no leg swelling no cramping.  Patient called EMS who called code STEMI in the field.  Patient was taken directly to the Cath Lab at this s/p a single stent placement for his inferior STEMI.  Patient is seen in the ICU at this time and reports no chest discomfort no headache no nausea vomiting no diarrhea and is feeling much better.  Patient is slightly sleepy I believe due to still being sedated from his Cath Lab medications.  Family member, wife is at the bedside Review of Systems: As mentioned in the history of present illness. All other systems reviewed and are negative. Past Medical History:  Diagnosis Date   Anemia    of chronic renal disease   Bone spur of other site    right shoulder, managed by ortho   Chronic pain    [name] CFNP at [facility]   CKD (chronic kidney disease)    stage III 03/2014 (Dr. 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 Dr Mosetta Pigeon   Primary localized osteoarthritis of right knee    Smoker    Past Surgical History:  Procedure Laterality Date   CARPAL TUNNEL RELEASE Left    CATARACT EXTRACTION W/ INTRAOCULAR LENS IMPLANT Left 2008   CATARACT EXTRACTION W/ INTRAOCULAR LENS IMPLANT Right 2008   EYE SURGERY     HERNIA REPAIR     JOINT REPLACEMENT Left 03/2014   TOTAL KNEE ARTHROPLASTY Left 03/30/2014   Procedure: TOTAL KNEE ARTHROPLASTY;  Surgeon: Nilda Simmer, MD;  Location: [facility] OR;  Service: Orthopedics;  Laterality: Left;   TOTAL KNEE ARTHROPLASTY Right 10/26/2014   Procedure: TOTAL KNEE ARTHROPLASTY;  Surgeon: Nilda Simmer, MD;  Location: [facility] OR;  Service: Orthopedics;  Laterality: Right;   UMBILICAL HERNIA REPAIR  2010   Social History:  reports that he has been smoking cigarettes. He has a 11.00 pack-year smoking history. He has never used smokeless tobacco. He reports that he does not drink alcohol and does not use drugs. I advised patient to quit tobacco smoking.  Patient is agreeable. Allergies  Allergen Reactions   Nsaids Other (See Comments)    impaired renal function    Family History  Problem Relation Age of Onset   COPD Mother    Kidney disease Mother  Thyroid disease Mother    Stroke Father 71   COPD Maternal Grandmother     Prior to Admission medications   Medication Sig Start Date End Date Taking? Authorizing Provider  amLODipine (NORVASC) 10 MG tablet Take 1 tablet (10 mg total) by mouth daily. 11/09/16  Yes Lada, Janit Bern, MD  Febuxostat 80 MG TABS Take 1 tablet by mouth every other day.    Yes [provider]  losartan (COZAAR) 50 MG tablet TAKE 1 TABLET BY MOUTH EVERY DAY 05/18/16  Yes Lada, Janit Bern, MD  oxyCODONE-acetaminophen (PERCOCET) 10-325 MG tablet Take 1 tablet by mouth every 6 (six) hours as needed for pain. Must last 30 days. 04/03/23 05/03/23 Yes [name], MD  atorvastatin (LIPITOR) 40 MG tablet Take 1 tablet (40 mg total) by mouth  at bedtime. For cholesterol 04/24/18   Lada, Janit Bern, MD  colchicine 0.6 MG tablet Take 1 tablet (0.6 mg total) by mouth daily. 10/08/21 07/18/22  Ward, Layla Maw, DO  furosemide (LASIX) 40 MG tablet Take 1 tablet (40 mg total) by mouth every other day. 06/05/16   Kerman Passey, MD  ibuprofen (ADVIL) 200 MG tablet Take 200 mg by mouth every 4 (four) hours as needed for moderate pain. 10/27/21   [provider]  naloxone [facility] nasal spray 4 mg/0.1 mL Use in case of accidental overdose with narcotics / opioids 10/26/17   Lada, Janit Bern, MD  oxyCODONE-acetaminophen (PERCOCET) 10-325 MG tablet Take 1 tablet by mouth every 6 (six) hours as needed for pain. Must last 30 days. 02/02/23 03/04/23  [name], MD  oxyCODONE-acetaminophen (PERCOCET) 10-325 MG tablet Take 1 tablet by mouth every 6 (six) hours as needed for pain. Must last 30 days. 03/04/23 04/03/23  [name], MD    Physical Exam: Vitals:   04/12/23 2205 04/12/23 2215 04/12/23 2230 04/12/23 2245  BP: 137/75 108/71 106/64 114/68  Pulse: 69 66 67 85  Resp: 17 12 13 13   Temp: 97.6 F (36.4 C)     TempSrc: Oral     SpO2: 97% (!) 87% 96% 96%  Weight: 116.1 kg     Height: 5\' 7"  (1.702 m)      Patient is somnolent.  No distress Patient wakes up on verbal stimulation.  Gives a coherent account of his history Respiratory; bilateral air entry vesicular Cardiovascular exam S1 is normal Abdomen soft nontender Extremities warm without edema.  Pressure bandage on right wrist is present, distal function intact. No focal motor deficit Data Reviewed:  Labs on Admission:  No results found for this or any previous visit (from the past 24 hour(s)). Basic Metabolic Panel: No results for input(s): "NA", "K", "CL", "CO2", "GLUCOSE", "BUN", "CREATININE", "CALCIUM", "MG", "PHOS" in the last 168 hours. Liver Function Tests: No results for input(s): "AST", "ALT", "ALKPHOS", "BILITOT", "PROT", "ALBUMIN" in the last 168 hours. No results  for input(s): "LIPASE", "AMYLASE" in the last 168 hours. No results for input(s): "AMMONIA" in the last 168 hours. CBC: No results for input(s): "WBC", "NEUTROABS", "HGB", "HCT", "MCV", "PLT" in the last 168 hours. Cardiac Enzymes: No results for input(s): "CKTOTAL", "CKMB", "CKMBINDEX", "TROPONINIHS" in the last 168 hours.  BNP (last 3 results) No results for input(s): "PROBNP" in the last 8760 hours. CBG: No results for input(s): "GLUCAP" in the last 168 hours.  Radiological Exams on Admission:  No results found.  EKG: Independently reviewed. Yes at 7:33 PM sing le mm STE elevation concave upwards in II III  Brief STEMI  note Patient presented via EMS from [facility] with inferior STEMI presentation chest pain started about 45 minutes prior to presentation Patient had 10 out of 10 chest pain midsternal he was mowing the grass when it started no previous cardiac history And presentation emergency room he was hemodynamically stable with persistent ST elevation inferiorly see reciprocal depressions laterally patient was given Brilinta heparin aspirin in emergency room and brought directly to the cardiac Cath Lab for emergent cardiac cath possible PCI and stent   Diagnostic cath suggested moderate disease in his left mid LAD at the bifurcation of the diagonal very large vessels TIMI-3 flow RCA was very large with a completely occluded PL at the ostium TIMI 0 flow   Intervention PCI and stent 1 DES 3.0 x 15 mm frontier Onyx to 14 atm lesion reduced from 100 down to 0% TIMI-3 flow was restored from TIMI 0 Smaller PDA was jailed in the process but there was no significant disease and maintained TIMI-3 flow Patient was placed on aspirin Brilinta Angiomax for an additional 2 hours and transferred to ICU Right groin with Mynx Attempt at right radial approach was unsuccessful but TR band was placed Case discussed with hospitalist for admission and treatment ICU cardiac care will be  continued until in the morning when he should be transferred to [facility] since he is unassigned Assessment and Plan: * STEMI involving right coronary artery (HCC) S/p PCI and drug-eluting stent to inferior wall MI artery.  Maintain on aspirin.  Patient on Brilinta per cardiology.  Bilavirudin will expire in a couple of hours.  Beta-blockers and losartan.  Check hemoglobin A1c, lipid panel.  Treat with statin.  Echo in morning  Pending routine chest x-ray CBC CMP.  Tobacco abuse Advised patient to quit smoking, further education to be provided by RN.  Will order nicotine replacement therapy  Gouty arthritis Chronic not in flare.  Continue with febuxostat, continue with oxycodone  Hypertension Hold patient amlodipine with plan to allow room for metoprolol.      Advance Care Planning:   Code Status: Full Code   Consults: sent a staff message to Dr. Elease Hashimoto of [facility] for cardiology consult in AM, please follow up. STEMI doc will not follow up patient in the morning.  Family Communication: Wife at bedside  Severity of Illness: The appropriate patient status for this patient is INPATIENT. Inpatient status is judged to be reasonable and necessary in order to provide the required intensity of service to ensure the patient's safety. The patient's presenting symptoms, physical exam findings, and initial radiographic and laboratory data in the context of their chronic comorbidities is felt to place them at high risk for further clinical deterioration. Furthermore, it is not anticipated that the patient will be medically stable for discharge from the hospital within 2 midnights of admission.   * I certify that at the point of admission it is my clinical judgment that the patient will require inpatient hospital care spanning beyond 2 midnights from the point of admission due to high intensity of service, high risk for further deterioration and high frequency of surveillance required.*  Author: Nolberto Hanlon,  MD 04/12/2023 11:02 PM  For on call review [email or web address].

## 2023-04-12 NOTE — Assessment & Plan Note (Signed)
S/p PCI and drug-eluting stent to inferior wall MI artery.  Maintain on aspirin.  Patient on Brilinta per cardiology.  Bilavirudin will expire in a couple of hours.  Beta-blockers and losartan.  Check hemoglobin A1c, lipid panel.  Treat with statin.  Echo in morning  Pending routine chest x-ray CBC CMP.

## 2023-04-12 NOTE — ED Notes (Signed)
Patient given 6000 units of heparin, 324 aspirin, 180 brilinta in the ED prior to cath lab, 4mg  morphine en route via EMS as well as 500 bolus of NS.

## 2023-04-12 NOTE — ED Notes (Signed)
Code Stemi called in the field by EMS

## 2023-04-12 NOTE — Assessment & Plan Note (Signed)
Chronic not in flare.  Continue with febuxostat, continue with oxycodone

## 2023-04-12 NOTE — ED Notes (Signed)
Dr. Juliann Pares arrived to room at this time.

## 2023-04-12 NOTE — CV Procedure (Signed)
Brief STEMI note Patient presented via EMS from [facility] with inferior STEMI presentation chest pain started about 45 minutes prior to presentation Patient had 10 out of 10 chest pain midsternal he was mowing the grass when it started no previous cardiac history And presentation emergency room he was hemodynamically stable with persistent ST elevation inferiorly see reciprocal depressions laterally patient was given Brilinta heparin aspirin in emergency room and brought directly to the cardiac Cath Lab for emergent cardiac cath possible PCI and stent  Diagnostic cath suggested moderate disease in his left mid LAD at the bifurcation of the diagonal very large vessels TIMI-3 flow RCA was very large with a completely occluded PL at the ostium TIMI 0 flow  Intervention PCI and stent 1 DES 3.0 x 15 mm frontier Onyx to 14 atm lesion reduced from 100 down to 0% TIMI-3 flow was restored from TIMI 0 Smaller PDA was jailed in the process but there was no significant disease and maintained TIMI-3 flow Patient was placed on aspirin Brilinta Angiomax for an additional 2 hours and transferred to ICU Right groin with Mynx Attempt at right radial approach was unsuccessful but TR band was placed Case discussed with hospitalist for admission and treatment ICU cardiac care will be continued until in the morning when he should be transferred to [facility] since he is unassigned

## 2023-04-12 NOTE — Progress Notes (Signed)
PHARMACIST - PHYSICIAN COMMUNICATION  CONCERNING:  Enoxaparin (Lovenox) for DVT Prophylaxis    RECOMMENDATION: Patient was prescribed enoxaprin 40mg  q24 hours for VTE prophylaxis.   Filed Weights   04/12/23 2200 04/12/23 2205  Weight: 116 kg (255 lb 11.7 oz) 116.1 kg (255 lb 15.3 oz)    Body mass index is 40.09 kg/m.  CrCl cannot be calculated (Patient's most recent lab result is older than the maximum 21 days allowed.).   Based on [facility] policy patient is candidate for enoxaparin 0.5mg /kg TBW SQ every 24 hours based on BMI being >30.  DESCRIPTION: Pharmacy has adjusted enoxaparin dose per [facility] policy.  Patient is now receiving enoxaparin 0.5 mg/kg every 24 hours   Otelia Sergeant, PharmD, [facility] 04/12/2023 11:10 PM

## 2023-04-13 ENCOUNTER — Other Ambulatory Visit: Payer: Medicare HMO

## 2023-04-13 DIAGNOSIS — I2111 ST elevation (STEMI) myocardial infarction involving right coronary artery: Secondary | ICD-10-CM | POA: Diagnosis not present

## 2023-04-13 DIAGNOSIS — N184 Chronic kidney disease, stage 4 (severe): Secondary | ICD-10-CM | POA: Insufficient documentation

## 2023-04-13 LAB — BASIC METABOLIC PANEL
Anion gap: 9 (ref 5–15)
BUN: 54 mg/dL — ABNORMAL HIGH (ref 6–20)
CO2: 18 mmol/L — ABNORMAL LOW (ref 22–32)
Calcium: 8.8 mg/dL — ABNORMAL LOW (ref 8.9–10.3)
Chloride: 108 mmol/L (ref 98–111)
Creatinine, Ser: 3.47 mg/dL — ABNORMAL HIGH (ref 0.61–1.24)
GFR, Estimated: 21 mL/min — ABNORMAL LOW (ref >60)
Glucose, Bld: 102 mg/dL — ABNORMAL HIGH (ref 70–99)
Potassium: 4.9 mmol/L (ref 3.5–5.1)
Sodium: 135 mmol/L (ref 135–145)

## 2023-04-13 LAB — CBC
HCT: 27.2 % — ABNORMAL LOW (ref 39.0–52.0)
Hemoglobin: 8.9 g/dL — ABNORMAL LOW (ref 13.0–17.0)
MCH: 29.1 pg (ref 26.0–34.0)
MCHC: 32.7 g/dL (ref 30.0–36.0)
MCV: 88.9 fL (ref 80.0–100.0)
Platelets: 280 10*3/uL (ref 150–400)
RBC: 3.06 MIL/uL — ABNORMAL LOW (ref 4.22–5.81)
RDW: 13.5 % (ref 11.5–15.5)
WBC: 9.6 10*3/uL (ref 4.0–10.5)
nRBC: 0 % (ref 0.0–0.2)

## 2023-04-13 LAB — TROPONIN I (HIGH SENSITIVITY)
Troponin I (High Sensitivity): >24000 ng/L (ref <18)
Troponin I (High Sensitivity): >24000 ng/L (ref <18)

## 2023-04-13 LAB — LIPID PANEL
Cholesterol: 187 mg/dL (ref 0–200)
HDL: 22 mg/dL — ABNORMAL LOW (ref >40)
LDL Cholesterol: UNDETERMINED mg/dL (ref 0–99)
Total CHOL/HDL Ratio: 8.5 RATIO
Triglycerides: 445 mg/dL — ABNORMAL HIGH (ref <150)
VLDL: UNDETERMINED mg/dL (ref 0–40)

## 2023-04-13 LAB — LDL CHOLESTEROL, DIRECT: Direct LDL: 75 mg/dL (ref 0–99)

## 2023-04-13 LAB — GLUCOSE, CAPILLARY
Glucose-Capillary: 185 mg/dL — ABNORMAL HIGH (ref 70–99)
Glucose-Capillary: 113 mg/dL — ABNORMAL HIGH (ref 70–99)

## 2023-04-13 LAB — HIV ANTIBODY (ROUTINE TESTING W REFLEX): HIV Screen 4th Generation wRfx: NONREACTIVE

## 2023-04-13 MED ORDER — LOSARTAN POTASSIUM 50 MG PO TABS
50.0000 mg | ORAL_TABLET | Freq: Every day | ORAL | Status: DC
  Administered 2023-04-14: 50 mg via ORAL
  Filled 2023-04-13 (×2): qty 1

## 2023-04-13 MED ORDER — AMLODIPINE BESYLATE 5 MG PO TABS
5.0000 mg | ORAL_TABLET | Freq: Every day | ORAL | Status: DC
  Administered 2023-04-13 – 2023-04-14 (×2): 5 mg via ORAL
  Filled 2023-04-13 (×2): qty 1

## 2023-04-13 NOTE — ED Provider Notes (Signed)
[facility] Provider Note    Event Date/Time   First MD Initiated Contact with Patient 04/12/23 2024     (approximate)   History   Code STEMI (/)   HPI  JOVONTAE COBAUGH is a 52 y.o. male with history of hypertension presenting to the emergency department for evaluation of acute chest pain as a code STEMI.  Patient reports that he was using a weed Johnell Comings he when he had onset of chest pain described as a pressure in the center of his chest about 45 minutes prior to presentation.  With EMS, he was found to have ST elevation in his inferior leads and was acted as a code STEMI.  Patient was noted to be diaphoretic with ongoing pain and route.   Physical Exam   Triage Vital Signs: ED Triage Vitals  Enc Vitals Group     BP 04/12/23 2025 (!) 181/104     Pulse Rate 04/12/23 2026 87     Resp 04/12/23 2026 20     Temp 04/12/23 2205 97.6 F (36.4 C)     Temp Source 04/12/23 2205 Oral     SpO2 04/12/23 2026 99 %     Weight 04/12/23 2200 255 lb 11.7 oz (116 kg)     Height 04/12/23 2205 5\' 7"  (1.702 m)     Head Circumference --      Peak Flow --      Pain Score 04/12/23 2027 10     Pain Loc --      Pain Edu? --      Excl. in GC? --     Most recent vital signs: Vitals:   04/12/23 2330 04/12/23 2345  BP: 124/66 106/71  Pulse: 78 70  Resp: 15 12  Temp:    SpO2: 100% 95%     General: Awake, interactive  CV:  Regular rate, good peripheral perfusion.  Resp:  Lungs clear, unlabored respirations.  Abd:  Soft, nondistended.  Neuro:  Symmetric facial movement, fluid speech   ED Results / Procedures / Treatments   Labs (all labs ordered are listed, but only abnormal results are displayed) Labs Reviewed  CBC - Abnormal; Notable for the following components:      Result Value   WBC 10.9 (*)    RBC 3.52 (*)    Hemoglobin 10.2 (*)    HCT 31.2 (*)    All other components within normal limits  COMPREHENSIVE METABOLIC PANEL - Abnormal; Notable for the  following components:   Sodium 134 (*)    CO2 20 (*)    Glucose, Bld 179 (*)    BUN 55 (*)    Creatinine, Ser 3.61 (*)    Calcium 8.7 (*)    AST 70 (*)    GFR, Estimated 20 (*)    All other components within normal limits  TROPONIN I (HIGH SENSITIVITY) - Abnormal; Notable for the following components:   Troponin I (High Sensitivity) 20,537 (*)    All other components within normal limits  MRSA NEXT GEN BY PCR, NASAL  BASIC METABOLIC PANEL  CBC  LIPOPROTEIN A (LPA)  LIPID PANEL  HEMOGLOBIN A1C  HIV ANTIBODY (ROUTINE TESTING W REFLEX)  TROPONIN I (HIGH SENSITIVITY)     EKG EKG independently reviewed interpreted by myself (ER attending) demonstrates:  Multiple EMS EKGs reviewed demonstrating ST elevation in the inferior leads with reciprocal depressions in the lateral leads  RADIOLOGY Imaging independently reviewed and interpreted by myself demonstrates:  PROCEDURES:  Critical Care performed: Yes, see critical care procedure note(s)  CRITICAL CARE Performed by: Trinna Post   Total critical care time: 30 minutes  Critical care time was exclusive of separately billable procedures and treating other patients.  Critical care was necessary to treat or prevent imminent or life-threatening deterioration.  Critical care was time spent personally by me on the following activities: development of treatment plan with patient and/or surrogate as well as nursing, discussions with consultants, evaluation of patient's response to treatment, examination of patient, obtaining history from patient or surrogate, ordering and performing treatments and interventions, ordering and review of laboratory studies, ordering and review of radiographic studies, pulse oximetry and re-evaluation of patient's condition.   MEDICATIONS ORDERED IN ED: Medications  labetalol (NORMODYNE) injection 10 mg (has no administration in time range)  hydrALAZINE (APRESOLINE) injection 10 mg (has no administration  in time range)  acetaminophen (TYLENOL) tablet 650 mg (650 mg Oral Given by Other 04/12/23 2245)  ondansetron (ZOFRAN) injection 4 mg (has no administration in time range)  0.9% sodium chloride infusion (1 mL/kg/hr  116.1 kg Intravenous New Bag/Given 04/12/23 2240)  sodium chloride flush (NS) 0.9 % injection 3 mL (has no administration in time range)  sodium chloride flush (NS) 0.9 % injection 3 mL (has no administration in time range)  0.9 %  sodium chloride infusion (has no administration in time range)  diazepam (VALIUM) tablet 5 mg (has no administration in time range)  aspirin chewable tablet 81 mg (has no administration in time range)  ticagrelor (BRILINTA) tablet 90 mg (has no administration in time range)  zolpidem (AMBIEN) tablet 5 mg (5 mg Oral Given by Other 04/12/23 2244)  bivalirudin (ANGIOMAX) 250 mg in sodium chloride 0.9 % 50 mL (5 mg/mL) infusion (0.25 mg/kg/hr  116 kg Intravenous Handoff 04/12/23 2205)  metoprolol tartrate (LOPRESSOR) tablet 25 mg (25 mg Oral Given by Other 04/12/23 2245)  losartan (COZAAR) tablet 50 mg (has no administration in time range)  Chlorhexidine Gluconate Cloth 2 % PADS 6 each (6 each Topical Given 04/12/23 2205)  febuxostat (ULORIC) tablet 80 mg (has no administration in time range)  enoxaparin (LOVENOX) injection 60 mg (has no administration in time range)  sodium chloride flush (NS) 0.9 % injection 3 mL (3 mLs Intravenous Given 04/12/23 2326)  nicotine (NICODERM CQ - dosed in mg/24 hours) patch 21 mg (21 mg Transdermal Patch Applied 04/12/23 2324)  nicotine polacrilex (NICORETTE) gum 2 mg (has no administration in time range)  oxyCODONE (Oxy IR/ROXICODONE) immediate release tablet 10 mg (has no administration in time range)    And  acetaminophen (TYLENOL) tablet 325 mg (has no administration in time range)  atorvastatin (LIPITOR) tablet 80 mg (80 mg Oral Given 04/12/23 2324)  ticagrelor (BRILINTA) tablet 180 mg (180 mg Oral Given 04/12/23 2020)   aspirin chewable tablet 324 mg (324 mg Oral Given 04/12/23 2020)  heparin sodium (porcine) injection 6,000 Units (6,000 Units Intravenous Given 04/12/23 2020)  nitroGLYCERIN 50 mg in dextrose 5 % 250 mL (0.2 mg/mL) infusion (20 mcg/min Intravenous Rate/Dose Change 04/12/23 2058)  bivalirudin (ANGIOMAX) 250 mg in sodium chloride 0.9 % 50 mL (5 mg/mL) infusion (0.25 mg/kg/hr  115 kg (Order-Specific) Intravenous New Bag/Given 04/12/23 2141)     IMPRESSION / MDM / ASSESSMENT AND PLAN / ED COURSE  I reviewed the triage vital signs and the nursing notes.  Differential diagnosis includes, but is not limited to, acute STEMI, lower suspicion aortic dissection, PE, other STEMI manage based on  clinical history  Patient's presentation is most consistent with acute presentation with potential threat to life or bodily function.  52 year old male presenting as a code STEMI.  Did receive EMS EKG prior to presentation and reviewed which was concerning for inferior STEMI.  Case was discussed with EMS prior to presentation.  Initial concern for aspirin allergy, but patient reports that this is just an upset stomach and he does not have a true allergy.  He was given 324 of aspirin.  He was evaluated alongside Dr. Juliann Pares with STEMI team.  He was taken to the Cath Lab for further intervention.      FINAL CLINICAL IMPRESSION(S) / ED DIAGNOSES   Final diagnoses:  ST elevation myocardial infarction (STEMI), unspecified artery (HCC)     Rx / DC Orders   ED Discharge Orders          Ordered    AMB Referral to Cardiac Rehabilitation - Phase II        04/12/23 2150             Note:  This document was prepared using Dragon voice recognition software and may include unintentional dictation errors.   Trinna Post, MD 04/13/23 [phone]

## 2023-04-13 NOTE — Progress Notes (Signed)
[facility] CLINIC CARDIOLOGY PROGRESS NOTE   Patient ID: Nathan Ramos MRN: [id number] DOB/AGE: [birth year] 52 y.o.  Admit date: 04/12/2023 Referring Physician Dr. Trinna Post Primary Physician Dr. [name] Primary Cardiologist Dr. Dorothyann Peng Reason for Consultation STEMI  HPI: Nathan Ramos is a 52 y.o. male with a past medical history of hypertension, chronic pain syndrome, polycystic kidney disease who presented to the ED on 04/12/2023 for chest pain.  Cardiology was consulted for evaluation of elevated troponins, EKG changes.  Interval History:  -Patient reports that he is feeling better this morning, denies any recurrent chest pain. -BP improved today. -Renal function improved on labs this morning.  Of note, patient is not consistently followed by primary care provider. Most consistently follows with pain clinic.   Review of systems complete and found to be negative unless listed above    Vitals:   04/13/23 1045 04/13/23 1100 04/13/23 1115 04/13/23 1130  BP: 98/68 106/71 108/82 128/80  Pulse: 64 72 67 62  Resp: 12 15 13 12   Temp:      TempSrc:      SpO2: 96% 96% 94% 98%  Weight:      Height:         Intake/Output Summary (Last 24 hours) at 04/13/2023 1305 Last data filed at 04/13/2023 1100 Gross per 24 hour  Intake 1866.46 ml  Output 1750 ml  Net 116.46 ml     PHYSICAL EXAM General: Well nourished male, in no acute distress sitting upright in hospital bed.  Multiple family members present at bedside. HEENT: Normocephalic and atraumatic. Neck: No JVD.  Lungs: Normal respiratory effort on room air. Clear bilaterally to auscultation. No wheezes, crackles, rhonchi.  Heart: HRRR. Normal S1 and S2 without gallops or murmurs. Radial & DP pulses 2+ bilaterally. Abdomen: Non-distended appearing.  Msk: Normal strength and tone for age. Extremities: No clubbing, cyanosis or edema.   Neuro: Alert and oriented X 3. Psych: Mood appropriate, affect congruent.     LABS: Basic Metabolic Panel: Recent Labs    04/12/23 2239 04/13/23 0708  NA 134* 135  K 5.0 4.9  CL 105 108  CO2 20* 18*  GLUCOSE 179* 102*  BUN 55* 54*  CREATININE 3.61* 3.47*  CALCIUM 8.7* 8.8*   Liver Function Tests: Recent Labs    04/12/23 2239  AST 70*  ALT 20  ALKPHOS 122  BILITOT 0.4  PROT 7.5  ALBUMIN 4.1   No results for input(s): "LIPASE", "AMYLASE" in the last 72 hours. CBC: Recent Labs    04/12/23 2239 04/13/23 0708  WBC 10.9* 9.6  HGB 10.2* 8.9*  HCT 31.2* 27.2*  MCV 88.6 88.9  PLT 281 280   Cardiac Enzymes: Recent Labs    04/12/23 2239 04/13/23 0019 04/13/23 0706  TROPONINIHS 20,537* >24,000* >24,000*   BNP: No results for input(s): "BNP" in the last 72 hours. D-Dimer: No results for input(s): "DDIMER" in the last 72 hours. Hemoglobin A1C: No results for input(s): "HGBA1C" in the last 72 hours. Fasting Lipid Panel: Recent Labs    04/13/23 0708  CHOL 187  HDL 22*  LDLCALC UNABLE TO CALCULATE IF TRIGLYCERIDE OVER 400 mg/dL  TRIG 409*  CHOLHDL 8.5   Thyroid Function Tests: No results for input(s): "TSH", "T4TOTAL", "T3FREE", "THYROIDAB" in the last 72 hours.  Invalid input(s): "FREET3" Anemia Panel: No results for input(s): "VITAMINB12", "FOLATE", "FERRITIN", "TIBC", "IRON", "RETICCTPCT" in the last 72 hours.  DG Chest Port 1 View  Result Date: 04/12/2023 CLINICAL DATA:  Chest  pain EXAM: PORTABLE CHEST 1 VIEW COMPARISON:  CT from 10/08/2021 FINDINGS: Cardiac shadow is mildly enlarged in size. Mild central vascular congestion is noted. No focal infiltrate or effusion is seen. No bony abnormality is noted. IMPRESSION: Mild vascular congestion. Electronically Signed   By: Alcide Clever M.D.   On: 04/12/2023 23:34     ECHO pending  TELEMETRY reviewed by me 04/13/23: sinus rhythm rates 60s  EKG reviewed by me 04/13/23: Not available for review  DATA reviewed by me 04/13/23: ED provider note, admission H&P, CV procedure note,  last 24h vitals tele labs imaging I/O   ASSESSMENT AND PLAN: Nathan Ramos is a 52 y.o. male with a past medical history of hypertension, chronic pain syndrome, polycystic kidney disease who presented to the ED on 04/12/2023 for chest pain.  Cardiology was consulted for evaluation of elevated troponins, EKG changes.   # Inferior STEMI # S/p PCI and DES to PD -Continue aspirin and Brilinta. Given patient's history of polycystic kidney disease will discontinue aspirin after 2 weeks. -Start amlodipine 5 mg daily, uptitrate as needed pending blood pressure. Continue metoprolol 25 mg twice daily. Avoid ACE/ARB given renal function. -Continue high intensity statin  # Hypertension BP improved this morning. -Start amlodipine 5 mg daily.  Continue metoprolol 25 mg twice daily.  -Other medications limited by renal dysfunction.  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 Previously saw Dr. Thedore Mins BUN/Cr today 54/3.47, GFR 21  -Nephrology consulted   Principal Problem:   STEMI involving right coronary artery [facility]) Active Problems:   Hypertension   Gouty arthritis   Tobacco abuse   STEMI (ST elevation myocardial infarction) [facility])    This patient's case was discussed and created with Dr. Juliann Pares and he is in agreement.  Casmira Cramer [place], PA-C 04/13/2023, 1:05 PM [facility] Cardiology

## 2023-04-13 NOTE — Progress Notes (Signed)
PROGRESS NOTE    Nathan Ramos  ZOX:[id number] DOB: 08/10/1971 DOA: 04/12/2023 PCP: [facility]  Outpatient Specialists: nephrology    Brief Narrative:   From admission h and p  Nathan Ramos is a 52 y.o. male with medical history significant of chronic gout, chronic pain.  Patient was in his usual state of health till earlier this afternoon when patient was at home and in his backyard and patient had sudden onset of left anterior chest pain as if a "vice had hit him ".  It was crushing, no aggravating relieving factors no radiation.  Associated with fatigue, feeling of doom, some sweating.  No shortness of breath no palpitation no leg swelling no cramping.  Patient called EMS who called code STEMI in the field.  Patient was taken directly to the Cath Lab at this s/p a single stent placement for his inferior STEMI   Assessment & Plan:   Principal Problem:   STEMI involving right coronary artery [facility]) Active Problems:   Chronic pain   Hypertension   Gouty arthritis   Tobacco abuse   STEMI (ST elevation myocardial infarction) (HCC)   CKD (chronic kidney disease) stage 4, GFR 15-29 ml/min (HCC)  # NSTEMI S/p PCI with DES on 5/30. Symptoms resolved, hemodynamically stable - smoking cessation counseled - continue asa, brilinta, statin, metop, home losartan - TTE ordered - transfer to floor - cardiology advises d/c tomorrow if stable overnight - cardiac rehab at d/c  # Chronic pain - home meds - f/u hiv, hcv  # CKD 4 Patient followed by nephrology. Recent kidney function labs not available for comparison but patient says cr in the 3s "sounds right." - outpt f/u  # HTN BP appropriate - cont home losart - metop added  # Morbid obesity Noted  # Tobacco abuse - counseling provided  DVT prophylaxis: lovenox Code Status: full Family Communication: wife updated @ bedside  Level of care: ICU Status is: Inpatient Remains inpatient appropriate  because: need for inpatient monitoring tonight    Consultants:  cardiology  Procedures: LHC   Antimicrobials:  none    Subjective: Feeling well, no chest pain or dyspnea  Objective: Vitals:   04/13/23 1315 04/13/23 1330 04/13/23 1345 04/13/23 1400  BP: 127/80 (!) 143/89  125/88  Pulse: 65 77 74 69  Resp: (!) 9 17 15 14   Temp:      TempSrc:      SpO2: 98% 99% 100% 100%  Weight:      Height:        Intake/Output Summary (Last 24 hours) at 04/13/2023 1411 Last data filed at 04/13/2023 1100 Gross per 24 hour  Intake 1866.46 ml  Output 1750 ml  Net 116.46 ml   Filed Weights   04/12/23 2200 04/12/23 2205  Weight: 116 kg 116.1 kg    Examination:  General exam: Appears calm and comfortable  Respiratory system: Clear to auscultation. Respiratory effort normal. Cardiovascular system: S1 & S2 heard, rrr, distant heart sounds Gastrointestinal system: Abdomen is obese, soft and nontender. No organomegaly or masses felt. Normal bowel sounds heard. Central nervous system: Alert and oriented. No focal neurological deficits. Extremities: Symmetric 5 x 5 power. Skin: No rashes, lesions or ulcers Psychiatry: Judgement and insight appear normal. Mood & affect appropriate.     Data Reviewed: I have personally reviewed following labs and imaging studies  CBC: Recent Labs  Lab 04/12/23 2239 04/13/23 0708  WBC 10.9* 9.6  HGB 10.2* 8.9*  HCT 31.2*  27.2*  MCV 88.6 88.9  PLT 281 280   Basic Metabolic Panel: Recent Labs  Lab 04/12/23 2239 04/13/23 0708  NA 134* 135  K 5.0 4.9  CL 105 108  CO2 20* 18*  GLUCOSE 179* 102*  BUN 55* 54*  CREATININE 3.61* 3.47*  CALCIUM 8.7* 8.8*   GFR: Estimated Creatinine Clearance: 31.4 mL/min (A) (by C-G formula based on SCr of 3.47 mg/dL (H)). Liver Function Tests: Recent Labs  Lab 04/12/23 2239  AST 70*  ALT 20  ALKPHOS 122  BILITOT 0.4  PROT 7.5  ALBUMIN 4.1   No results for input(s): "LIPASE", "AMYLASE" in the last  168 hours. No results for input(s): "AMMONIA" in the last 168 hours. Coagulation Profile: No results for input(s): "INR", "PROTIME" in the last 168 hours. Cardiac Enzymes: No results for input(s): "CKTOTAL", "CKMB", "CKMBINDEX", "TROPONINI" in the last 168 hours. BNP (last 3 results) No results for input(s): "PROBNP" in the last 8760 hours. HbA1C: No results for input(s): "HGBA1C" in the last 72 hours. CBG: Recent Labs  Lab 04/12/23 2206 04/13/23 0719  GLUCAP 185* 113*   Lipid Profile: Recent Labs    04/13/23 0708  CHOL 187  HDL 22*  LDLCALC UNABLE TO CALCULATE IF TRIGLYCERIDE OVER 400 mg/dL  TRIG 409*  CHOLHDL 8.5   Thyroid Function Tests: No results for input(s): "TSH", "T4TOTAL", "FREET4", "T3FREE", "THYROIDAB" in the last 72 hours. Anemia Panel: No results for input(s): "VITAMINB12", "FOLATE", "FERRITIN", "TIBC", "IRON", "RETICCTPCT" in the last 72 hours. Urine analysis:    Component Value Date/Time   COLORURINE YELLOW 10/16/2014 0940   APPEARANCEUR CLEAR 10/16/2014 0940   LABSPEC 1.008 10/16/2014 0940   PHURINE 5.0 10/16/2014 0940   GLUCOSEU NEGATIVE 10/16/2014 0940   HGBUR NEGATIVE 10/16/2014 0940   BILIRUBINUR NEGATIVE 10/16/2014 0940   KETONESUR NEGATIVE 10/16/2014 0940   PROTEINUR NEGATIVE 10/16/2014 0940   UROBILINOGEN 0.2 10/16/2014 0940   NITRITE NEGATIVE 10/16/2014 0940   LEUKOCYTESUR NEGATIVE 10/16/2014 0940   Sepsis Labs: @LABRCNTIP (procalcitonin:4,lacticidven:4)  ) Recent Results (from the past 240 hour(s))  MRSA Next Gen by PCR, Nasal     Status: None   Collection Time: 04/12/23 10:05 PM   Specimen: Nasal Mucosa; Nasal Swab  Result Value Ref Range Status   MRSA by PCR Next Gen NOT DETECTED NOT DETECTED Final    Comment: (NOTE) The GeneXpert MRSA Assay (FDA approved for NASAL specimens only), is one component of a comprehensive MRSA colonization surveillance program. It is not intended to diagnose MRSA infection nor to guide or monitor  treatment for MRSA infections. Test performance is not FDA approved in patients less than 37 years old. Performed at [facility], [address], [place], [place] [address]          Radiology Studies: DG Chest [place] 1 View  Result Date: 04/12/2023 CLINICAL DATA:  Chest pain EXAM: PORTABLE CHEST 1 VIEW COMPARISON:  CT from 10/08/2021 FINDINGS: Cardiac shadow is mildly enlarged in size. Mild central vascular congestion is noted. No focal infiltrate or effusion is seen. No bony abnormality is noted. IMPRESSION: Mild vascular congestion. Electronically Signed   By: Alcide Clever M.D.   On: 04/12/2023 23:34        Scheduled Meds:  amLODipine  5 mg Oral Daily   aspirin  81 mg Oral Daily   atorvastatin  80 mg Oral Daily   Chlorhexidine Gluconate Cloth  6 each Topical Daily   enoxaparin (LOVENOX) injection  60 mg Subcutaneous Q24H   febuxostat  80 mg Oral QODAY   metoprolol tartrate  25 mg Oral BID   nicotine  21 mg Transdermal Daily   sodium chloride flush  3 mL Intravenous Q12H   sodium chloride flush  3 mL Intravenous Q12H   ticagrelor  90 mg Oral BID   Continuous Infusions:  sodium chloride       LOS: 1 day     Silvano Bilis, MD [facility]   If 7PM-7AM, please contact night-coverage [email or web address] Password [email or web address] 04/13/2023, 2:11 PM

## 2023-04-13 NOTE — Plan of Care (Signed)
Continuing with plan of care. 

## 2023-04-14 ENCOUNTER — Inpatient Hospital Stay
Admit: 2023-04-14 | Discharge: 2023-04-14 | Disposition: A | Discharge status: Home / Self Care | Payer: Medicare HMO | Attending: Internal Medicine | Admitting: Internal Medicine

## 2023-04-14 DIAGNOSIS — N1832 Chronic kidney disease, stage 3b: Secondary | ICD-10-CM | POA: Diagnosis not present

## 2023-04-14 DIAGNOSIS — I2111 ST elevation (STEMI) myocardial infarction involving right coronary artery: Secondary | ICD-10-CM | POA: Diagnosis not present

## 2023-04-14 DIAGNOSIS — E875 Hyperkalemia: Secondary | ICD-10-CM | POA: Diagnosis not present

## 2023-04-14 DIAGNOSIS — N179 Acute kidney failure, unspecified: Secondary | ICD-10-CM | POA: Diagnosis not present

## 2023-04-14 DIAGNOSIS — I220 Subsequent ST elevation (STEMI) myocardial infarction of anterior wall: Secondary | ICD-10-CM | POA: Diagnosis not present

## 2023-04-14 DIAGNOSIS — D631 Anemia in chronic kidney disease: Secondary | ICD-10-CM | POA: Diagnosis not present

## 2023-04-14 DIAGNOSIS — I129 Hypertensive chronic kidney disease with stage 1 through stage 4 chronic kidney disease, or unspecified chronic kidney disease: Secondary | ICD-10-CM | POA: Diagnosis not present

## 2023-04-14 DIAGNOSIS — I213 ST elevation (STEMI) myocardial infarction of unspecified site: Secondary | ICD-10-CM | POA: Diagnosis not present

## 2023-04-14 LAB — CBC
HCT: 28.2 % — ABNORMAL LOW (ref 39.0–52.0)
Hemoglobin: 9.1 g/dL — ABNORMAL LOW (ref 13.0–17.0)
MCH: 28.7 pg (ref 26.0–34.0)
MCHC: 32.3 g/dL (ref 30.0–36.0)
MCV: 89 fL (ref 80.0–100.0)
Platelets: 246 10*3/uL (ref 150–400)
RBC: 3.17 MIL/uL — ABNORMAL LOW (ref 4.22–5.81)
RDW: 13.4 % (ref 11.5–15.5)
WBC: 8.9 10*3/uL (ref 4.0–10.5)
nRBC: 0 % (ref 0.0–0.2)

## 2023-04-14 LAB — ECHOCARDIOGRAM COMPLETE
AR max vel: 3.76 cm2
AV Peak grad: 5.7 mmHg
Ao pk vel: 1.19 m/s
Area-P 1/2: 3.85 cm2
Calc EF: 65.8 %
Height: 67 in
S' Lateral: 3.35 cm
Single Plane A2C EF: 65.9 %
Single Plane A4C EF: 64.4 %
Weight: 4095.26 oz

## 2023-04-14 LAB — BASIC METABOLIC PANEL
Anion gap: 9 (ref 5–15)
BUN: 60 mg/dL — ABNORMAL HIGH (ref 6–20)
CO2: 20 mmol/L — ABNORMAL LOW (ref 22–32)
Calcium: 8.8 mg/dL — ABNORMAL LOW (ref 8.9–10.3)
Chloride: 107 mmol/L (ref 98–111)
Creatinine, Ser: 3.71 mg/dL — ABNORMAL HIGH (ref 0.61–1.24)
GFR, Estimated: 19 mL/min — ABNORMAL LOW (ref >60)
Glucose, Bld: 84 mg/dL (ref 70–99)
Potassium: 6.3 mmol/L (ref 3.5–5.1)
Sodium: 136 mmol/L (ref 135–145)

## 2023-04-14 LAB — HEMOGLOBIN A1C
Hgb A1c MFr Bld: 5.5 % (ref 4.8–5.6)
Mean Plasma Glucose: 111 mg/dL

## 2023-04-14 LAB — BASIC METABOLIC PANEL WITH GFR
Anion gap: 8 (ref 5–15)
BUN: 56 mg/dL — ABNORMAL HIGH (ref 6–20)
CO2: 19 mmol/L — ABNORMAL LOW (ref 22–32)
Calcium: 9 mg/dL (ref 8.9–10.3)
Chloride: 109 mmol/L (ref 98–111)
Creatinine, Ser: 3.77 mg/dL — ABNORMAL HIGH (ref 0.61–1.24)
GFR, Estimated: 19 mL/min — ABNORMAL LOW
Glucose, Bld: 80 mg/dL (ref 70–99)
Potassium: 5.3 mmol/L — ABNORMAL HIGH (ref 3.5–5.1)
Sodium: 136 mmol/L (ref 135–145)

## 2023-04-14 MED ORDER — CALCIUM GLUCONATE-NACL 1-0.675 GM/50ML-% IV SOLN
1.0000 g | Freq: Once | INTRAVENOUS | Status: AC
  Administered 2023-04-14: 1000 mg via INTRAVENOUS
  Filled 2023-04-14: qty 50

## 2023-04-14 MED ORDER — SODIUM ZIRCONIUM CYCLOSILICATE 5 G PO PACK: 10.0000 g | PACK | Freq: Once | ORAL | Status: DC

## 2023-04-14 MED ORDER — SODIUM CHLORIDE 0.9 % IV BOLUS
500.0000 mL | Freq: Once | INTRAVENOUS | Status: AC
  Administered 2023-04-14: 500 mL via INTRAVENOUS

## 2023-04-14 MED ORDER — METOPROLOL TARTRATE 25 MG PO TABS: 25.0000 mg | ORAL_TABLET | Freq: Two times a day (BID) | ORAL | 1 refills | Status: DC

## 2023-04-14 MED ORDER — INSULIN ASPART 100 UNIT/ML IV SOLN
10.0000 [IU] | Freq: Once | INTRAVENOUS | Status: AC
  Administered 2023-04-14: 10 [IU] via INTRAVENOUS
  Filled 2023-04-14: qty 0.1

## 2023-04-14 MED ORDER — IRBESARTAN 150 MG PO TABS: 150.0000 mg | ORAL_TABLET | Freq: Every day | ORAL | Status: DC

## 2023-04-14 MED ORDER — IRBESARTAN 150 MG PO TABS: 150.0000 mg | ORAL_TABLET | Freq: Every day | ORAL | 1 refills | Status: DC

## 2023-04-14 MED ORDER — ATORVASTATIN CALCIUM 80 MG PO TABS: 80.0000 mg | ORAL_TABLET | Freq: Every day | ORAL | 1 refills | Status: DC

## 2023-04-14 MED ORDER — AMLODIPINE BESYLATE 5 MG PO TABS: 5.0000 mg | ORAL_TABLET | Freq: Every day | ORAL | 1 refills | Status: DC

## 2023-04-14 MED ORDER — SODIUM ZIRCONIUM CYCLOSILICATE 5 G PO PACK
10.0000 g | PACK | Freq: Once | ORAL | Status: AC
  Administered 2023-04-14: 10 g via ORAL
  Filled 2023-04-14: qty 2

## 2023-04-14 MED ORDER — PERFLUTREN LIPID MICROSPHERE
1.0000 mL | INTRAVENOUS | Status: AC | PRN
  Administered 2023-04-14: 4 mL via INTRAVENOUS

## 2023-04-14 MED ORDER — ASPIRIN 81 MG PO CHEW: 81.0000 mg | CHEWABLE_TABLET | Freq: Every day | ORAL | 11 refills | Status: DC

## 2023-04-14 MED ORDER — DEXTROSE 50 % IV SOLN
1.0000 | Freq: Once | INTRAVENOUS | Status: AC
  Administered 2023-04-14: 50 mL via INTRAVENOUS
  Filled 2023-04-14: qty 50

## 2023-04-14 MED ORDER — TICAGRELOR 90 MG PO TABS: 90.0000 mg | ORAL_TABLET | Freq: Two times a day (BID) | ORAL | 2 refills | Status: DC

## 2023-04-14 NOTE — Discharge Summary (Signed)
Physician Discharge Summary  Nathan Ramos WGN:[id number] DOB: 05-17-1971 DOA: 04/12/2023  PCP: [facility]  Admit date: 04/12/2023  Discharge date: 04/14/2023  Admitted From: Home.  Disposition:  Home  Recommendations for Outpatient Follow-up:  Follow up with PCP in 1-2 weeks. Please obtain BMP/CBC in one week. Advised to follow up Nephrology as scheduled Advised to follow up Cardiology in 2 weeks, Advised to take aspirin and Brilinta uninterrupted for 2 weeks followed by Brilinta only. Advised to discontinue aspirin after 2 weeks given history of polycystic kidney disease Advised to take Lipitor 80 mg daily  Home Health:None Equipment/Devices:None  Discharge Condition: Stable CODE STATUS:Full code Diet recommendation: Heart Healthy  Brief Summary/Hospital Course This 52 y.o. male with medical history significant of chronic gout, chronic pain.  Patient was in his usual state of health when patient was at home and in his backyard and patient had sudden onset of left anterior chest pain as if a "vice had hit him ".  It was crushing, no aggravating relieving factors no radiation.  Associated with fatigue, feeling of doom, some sweating.  No shortness of breath no palpitation no leg swelling no cramping.  Patient called EMS who called code STEMI in the field.  Patient was taken directly to the Cath Lab at this s/p  single stent placement for his inferior STEMI. Patient was admitted post inferior wall STEMI.  He underwent heart cath s/p PCI and DES.  Patient tolerated well.  Patient started on aspirin and Brilinta.  Cardiologist recommended aspirin and Brilinta uninterrupted for 2 weeks followed by Brilinta only.  Advised to discontinue aspirin after 2 weeks given history of polycystic kidney disease.  Renal functions improving and remains at baseline.  Patient also has hyperkalemia was given dextrose, insulin, Lasix, and albuterol inhaler.  Serum potassium improved.  Patient  wants to be discharged,  Cardiology signed off.  Patient is being discharged home.   Discharge Diagnoses:  Principal Problem:   STEMI involving right coronary artery [facility]) Active Problems:   Chronic pain   Hypertension   Gouty arthritis   Tobacco abuse   STEMI (ST elevation myocardial infarction) (HCC)   CKD (chronic kidney disease) stage 4, GFR 15-29 ml/min (HCC)   NSTEMI S/p PCI with DES on 5/30. Symptoms resolved, hemodynamically stable - smoking cessation counseled - continue asa, brilinta, statin, metop, home losartan - TTE showed LVEF 60-65%, - cardiac rehab at d/c   # Chronic pain - home meds - f/u hiv, hcv   # CKD 4 Patient followed by nephrology. Recent kidney function labs not available for comparison but patient says cr in the 3s "sounds right." - outpt f/u   # HTN BP appropriate -Losartan changed with irbesartan. - metop added   # Morbid obesity Noted   # Tobacco abuse - counseling provided  Discharge Instructions  Discharge Instructions     AMB Referral to Cardiac Rehabilitation - Phase II   Complete by: As directed    Diagnosis: STEMI   After initial evaluation and assessments completed: Virtual Based Care may be provided alone or in conjunction with Phase 2 Cardiac Rehab based on patient barriers.: Yes   Call MD for:  difficulty breathing, headache or visual disturbances   Complete by: As directed    Call MD for:  persistant dizziness or light-headedness   Complete by: As directed    Call MD for:  persistant nausea and vomiting   Complete by: As directed    Diet - low sodium  heart healthy   Complete by: As directed    Diet Carb Modified   Complete by: As directed    Discharge instructions   Complete by: As directed    Advised to follow up PCP in one week. Advised to follow up Cardiology in 2 weeks, Advised to take aspirin and Brilinta uninterrupted for 12 months. Advised to take Lipitor 80 mg daily.   Increase activity slowly   Complete  by: As directed       Allergies as of 04/14/2023       Reactions   Nsaids Other (See Comments)   impaired renal function        Medication List     STOP taking these medications    colchicine 0.6 MG tablet   furosemide 40 MG tablet Commonly known as: LASIX   ibuprofen 200 MG tablet Commonly known as: ADVIL   losartan 50 MG tablet Commonly known as: COZAAR       TAKE these medications    amLODipine 5 MG tablet Commonly known as: NORVASC Take 1 tablet (5 mg total) by mouth daily. Start taking on: April 15, 2023 What changed:  medication strength how much to take   aspirin 81 MG chewable tablet Chew 1 tablet (81 mg total) by mouth daily. Start taking on: April 15, 2023   atorvastatin 80 MG tablet Commonly known as: LIPITOR Take 1 tablet (80 mg total) by mouth daily. Start taking on: April 15, 2023 What changed:  medication strength how much to take when to take this additional instructions   Febuxostat 80 MG Tabs Take 1 tablet by mouth every other day.   irbesartan 150 MG tablet Commonly known as: AVAPRO Take 1 tablet (150 mg total) by mouth daily. Start taking on: April 15, 2023   metoprolol tartrate 25 MG tablet Commonly known as: LOPRESSOR Take 1 tablet (25 mg total) by mouth 2 (two) times daily.   naloxone 4 MG/0.1ML Liqd nasal spray kit Commonly known as: NARCAN Use in case of accidental overdose with narcotics / opioids   oxyCODONE-acetaminophen 10-325 MG tablet Commonly known as: Percocet Take 1 tablet by mouth every 6 (six) hours as needed for pain. Must last 30 days. What changed: Another medication with the same name was removed. Continue taking this medication, and follow the directions you see here.   ticagrelor 90 MG Tabs tablet Commonly known as: BRILINTA Take 1 tablet (90 mg total) by mouth 2 (two) times daily.        Follow-up Information     [name], MD. Go in 1 week(s).   Specialties: Cardiology, Internal  Medicine Contact information: [address] [place] [address] [phone]         [name], [place] Follow up in 1 week(s).   Specialty: Family Medicine Contact information: [address] [place] [address] [phone]         Mosetta Pigeon, MD Follow up in 1 week(s).   Specialty: Nephrology Contact information: [address] [place] [address] [phone]                Allergies  Allergen Reactions   Nsaids Other (See Comments)    impaired renal function    Consultations: Cardiology   Procedures/Studies: ECHOCARDIOGRAM COMPLETE  Result Date: 04/14/2023    ECHOCARDIOGRAM REPORT   Patient Name:   Nathan Ramos Date of Exam: 04/14/2023 Medical Rec #:  [id number]      Height:  67.0 in Accession #:    [id number]     Weight:       256.0 lb Date of Birth:  Mar 17, 1971      BSA:          2.246 m Patient Age:    51 years       BP:           127/81 mmHg Patient Gender: M              HR:           68 bpm. Exam Location:  [facility] Procedure: 2D Echo and Intracardiac Opacification Agent Indications:     Acute MI I21.9  History:         Patient has no prior history of Echocardiogram examinations.  Sonographer:     Overton Mam RDCS, FASE Referring Phys:  [id number] [facility] GOEL Diagnosing Phys: Adrian Blackwater  Sonographer Comments: Technically difficult study due to poor echo windows, suboptimal apical window and patient is obese. Image acquisition challenging due to patient body habitus. IMPRESSIONS  1. Left ventricular ejection fraction, by estimation, is 60 to 65%. The left ventricle has normal function. The left ventricle demonstrates regional wall motion abnormalities (see scoring diagram/findings for description). Left ventricular diastolic parameters are consistent with Grade I diastolic dysfunction (impaired relaxation).  2. Right ventricular systolic function is normal. The right ventricular size is normal.   3. Left atrial size was mildly dilated.  4. There is no evidence of cardiac tamponade.  5. The mitral valve is normal in structure. Mild mitral valve regurgitation. No evidence of mitral stenosis.  6. The aortic valve is normal in structure. Aortic valve regurgitation is not visualized. No aortic stenosis is present.  7. The inferior vena cava is normal in size with greater than 50% respiratory variability, suggesting right atrial pressure of 3 mmHg. FINDINGS  Left Ventricle: Left ventricular ejection fraction, by estimation, is 60 to 65%. The left ventricle has normal function. The left ventricle demonstrates regional wall motion abnormalities. Definity contrast agent was given IV to delineate the left ventricular endocardial borders. The left ventricular internal cavity size was normal in size. There is no left ventricular hypertrophy. Left ventricular diastolic parameters are consistent with Grade I diastolic dysfunction (impaired relaxation).  LV Wall Scoring: The entire inferior wall is hypokinetic. Right Ventricle: The right ventricular size is normal. No increase in right ventricular wall thickness. Right ventricular systolic function is normal. Left Atrium: Left atrial size was mildly dilated. Right Atrium: Right atrial size was normal in size. Pericardium: Trivial pericardial effusion is present. The pericardial effusion is posterior to the left ventricle. There is no evidence of cardiac tamponade. Mitral Valve: The mitral valve is normal in structure. Mild mitral valve regurgitation. No evidence of mitral valve stenosis. Tricuspid Valve: The tricuspid valve is normal in structure. Tricuspid valve regurgitation is trivial. No evidence of tricuspid stenosis. Aortic Valve: The aortic valve is normal in structure. Aortic valve regurgitation is not visualized. No aortic stenosis is present. Aortic valve peak gradient measures 5.7 mmHg. Pulmonic Valve: The pulmonic valve was normal in structure. Pulmonic valve  regurgitation is not visualized. No evidence of pulmonic stenosis. Aorta: The aortic root is normal in size and structure. Venous: The inferior vena cava is normal in size with greater than 50% respiratory variability, suggesting right atrial pressure of 3 mmHg. IAS/Shunts: No atrial level shunt detected by color flow Doppler.  LEFT VENTRICLE PLAX 2D LVIDd:  4.70 cm      Diastology LVIDs:         3.35 cm      LV e' medial:    9.68 cm/s LV PW:         1.25 cm      LV E/e' medial:  8.2 LV IVS:        1.30 cm      LV e' lateral:   10.80 cm/s LVOT diam:     2.40 cm      LV E/e' lateral: 7.4 LV SV:         82 LV SV Index:   37 LVOT Area:     4.52 cm  LV Volumes (MOD) LV vol d, MOD A2C: 183.0 ml LV vol d, MOD A4C: 153.0 ml LV vol s, MOD A2C: 62.4 ml LV vol s, MOD A4C: 54.5 ml LV SV MOD A2C:     120.6 ml LV SV MOD A4C:     153.0 ml LV SV MOD BP:      114.5 ml RIGHT VENTRICLE RV Basal diam:  3.40 cm RV S prime:     14.90 cm/s TAPSE (M-mode): 2.0 cm LEFT ATRIUM             Index        RIGHT ATRIUM           Index LA diam:        3.60 cm 1.60 cm/m   RA Area:     15.30 cm LA Vol (A2C):   27.6 ml 12.29 ml/m  RA Volume:   40.40 ml  17.99 ml/m LA Vol (A4C):   18.2 ml 8.10 ml/m LA Biplane Vol: 23.0 ml 10.24 ml/m  AORTIC VALVE                 PULMONIC VALVE AV Area (Vmax): 3.76 cm     PV Vmax:       1.30 m/s AV Vmax:        119.00 cm/s  PV Peak grad:  6.8 mmHg AV Peak Grad:   5.7 mmHg LVOT Vmax:      99.00 cm/s LVOT Vmean:     62.600 cm/s LVOT VTI:       0.182 m  AORTA Ao Root diam: 3.40 cm Ao Asc diam:  3.20 cm MITRAL VALVE MV Area (PHT): 3.85 cm    SHUNTS MV Decel Time: 197 msec    Systemic VTI:  0.18 m MV E velocity: 79.70 cm/s  Systemic Diam: 2.40 cm MV A velocity: 78.60 cm/s MV E/A ratio:  1.01 Shaukat Khan Electronically signed by Adrian Blackwater Signature Date/Time: 04/14/2023/10:58:07 AM    Final    DG Chest Port 1 View  Result Date: 04/12/2023 CLINICAL DATA:  Chest pain EXAM: PORTABLE CHEST 1 VIEW  COMPARISON:  CT from 10/08/2021 FINDINGS: Cardiac shadow is mildly enlarged in size. Mild central vascular congestion is noted. No focal infiltrate or effusion is seen. No bony abnormality is noted. IMPRESSION: Mild vascular congestion. Electronically Signed   By: Alcide Clever M.D.   On: 04/12/2023 23:34     Subjective: Patient seen and examined at bedside.  Overnight events noted.   Patient reports doing much better , wants to be discharged.  Cardiology signed off  Discharge Exam: Vitals:   04/14/23 1300 04/14/23 1400  BP: 115/79 125/88  Pulse: 73 69  Resp: 14 14  Temp:    SpO2: 97% 97%   Vitals:   04/14/23 1130  04/14/23 1200 04/14/23 1300 04/14/23 1400  BP:  126/89 115/79 125/88  Pulse: (!) 58 66 73 69  Resp: 14 14 14 14   Temp:  98.1 F (36.7 C)    TempSrc:  Oral    SpO2: 94% 98% 97% 97%  Weight:      Height:        General: Pt is alert, awake, not in acute distress Cardiovascular: RRR, S1/S2 +, no rubs, no gallops Respiratory: CTA bilaterally, no wheezing, no rhonchi Abdominal: Soft, NT, ND, bowel sounds + Extremities: no edema, no cyanosis    The results of significant diagnostics from this hospitalization (including imaging, microbiology, ancillary and laboratory) are listed below for reference.     Microbiology: Recent Results (from the past 240 hour(s))  MRSA Next Gen by PCR, Nasal     Status: None   Collection Time: 04/12/23 10:05 PM   Specimen: Nasal Mucosa; Nasal Swab  Result Value Ref Range Status   MRSA by PCR Next Gen NOT DETECTED NOT DETECTED Final    Comment: (NOTE) The GeneXpert MRSA Assay (FDA approved for NASAL specimens only), is one component of a comprehensive MRSA colonization surveillance program. It is not intended to diagnose MRSA infection nor to guide or monitor treatment for MRSA infections. Test performance is not FDA approved in patients less than 28 years old. Performed at [facility], [address] [place], [place]  [address]      Labs: BNP (last 3 results) No results for input(s): "BNP" in the last 8760 hours. Basic Metabolic Panel: Recent Labs  Lab 04/12/23 2239 04/13/23 0708 04/14/23 0341 04/14/23 0824  NA 134* 135 136 136  K 5.0 4.9 6.3* 5.3*  CL 105 108 107 109  CO2 20* 18* 20* 19*  GLUCOSE 179* 102* 84 80  BUN 55* 54* 60* 56*  CREATININE 3.61* 3.47* 3.71* 3.77*  CALCIUM 8.7* 8.8* 8.8* 9.0   Liver Function Tests: Recent Labs  Lab 04/12/23 2239  AST 70*  ALT 20  ALKPHOS 122  BILITOT 0.4  PROT 7.5  ALBUMIN 4.1   No results for input(s): "LIPASE", "AMYLASE" in the last 168 hours. No results for input(s): "AMMONIA" in the last 168 hours. CBC: Recent Labs  Lab 04/12/23 2239 04/13/23 0708 04/14/23 0341  WBC 10.9* 9.6 8.9  HGB 10.2* 8.9* 9.1*  HCT 31.2* 27.2* 28.2*  MCV 88.6 88.9 89.0  PLT 281 280 246   Cardiac Enzymes: No results for input(s): "CKTOTAL", "CKMB", "CKMBINDEX", "TROPONINI" in the last 168 hours. BNP: Invalid input(s): "POCBNP" CBG: Recent Labs  Lab 04/12/23 2206 04/13/23 0719  GLUCAP 185* 113*   D-Dimer No results for input(s): "DDIMER" in the last 72 hours. Hgb A1c Recent Labs    04/13/23 0708  HGBA1C 5.5   Lipid Profile Recent Labs    04/13/23 0708  CHOL 187  HDL 22*  LDLCALC UNABLE TO CALCULATE IF TRIGLYCERIDE OVER 400 mg/dL  TRIG 604*  CHOLHDL 8.5  LDLDIRECT 75   Thyroid function studies No results for input(s): "TSH", "T4TOTAL", "T3FREE", "THYROIDAB" in the last 72 hours.  Invalid input(s): "FREET3" Anemia work up No results for input(s): "VITAMINB12", "FOLATE", "FERRITIN", "TIBC", "IRON", "RETICCTPCT" in the last 72 hours. Urinalysis    Component Value Date/Time   COLORURINE YELLOW 10/16/2014 0940   APPEARANCEUR CLEAR 10/16/2014 0940   LABSPEC 1.008 10/16/2014 0940   PHURINE 5.0 10/16/2014 0940   GLUCOSEU NEGATIVE 10/16/2014 0940   HGBUR NEGATIVE 10/16/2014 0940   BILIRUBINUR NEGATIVE 10/16/2014 0940  KETONESUR NEGATIVE  10/16/2014 0940   PROTEINUR NEGATIVE 10/16/2014 0940   UROBILINOGEN 0.2 10/16/2014 0940   NITRITE NEGATIVE 10/16/2014 0940   LEUKOCYTESUR NEGATIVE 10/16/2014 0940   Sepsis Labs Recent Labs  Lab 04/12/23 2239 04/13/23 0708 04/14/23 0341  WBC 10.9* 9.6 8.9   Microbiology Recent Results (from the past 240 hour(s))  MRSA Next Gen by PCR, Nasal     Status: None   Collection Time: 04/12/23 10:05 PM   Specimen: Nasal Mucosa; Nasal Swab  Result Value Ref Range Status   MRSA by PCR Next Gen NOT DETECTED NOT DETECTED Final    Comment: (NOTE) The GeneXpert MRSA Assay (FDA approved for NASAL specimens only), is one component of a comprehensive MRSA colonization surveillance program. It is not intended to diagnose MRSA infection nor to guide or monitor treatment for MRSA infections. Test performance is not FDA approved in patients less than 69 years old. Performed at [facility], [address] [place], [place] [address]      Time coordinating discharge: Over 30 minutes  SIGNED:   Willeen Niece, MD  [facility] 04/14/2023, 2:30 PM Pager

## 2023-04-14 NOTE — Progress Notes (Signed)
  Echocardiogram 2D Echocardiogram has been performed. Definity IV ultrasound imaging agent was used on this study.   Nathan Ramos 04/14/2023, 9:01 AM

## 2023-04-14 NOTE — Discharge Instructions (Signed)
Advised to follow up PCP in one week. Advised to follow up Cardiology in 2 weeks, Advised to take aspirin and Brilinta uninterrupted for 12 months. Advised to take Lipitor 80 mg daily

## 2023-04-14 NOTE — Plan of Care (Signed)
Discharged to home

## 2023-04-14 NOTE — Progress Notes (Signed)
Discharge teaching completed with patient who is in stable condition.  Patient discharged with all belongings.

## 2023-04-14 NOTE — Progress Notes (Signed)
SUBJECTIVE: Patient is feeling much better denies any chest pain or shortness of breath  Vitals:   04/14/23 0010 04/14/23 0400 04/14/23 0511 04/14/23 0604  BP: 130/79  (!) 142/89 (!) 157/86  Pulse: 67  72 82  Resp: 14  14 13   Temp: 98.2 F (36.8 C) 98 F (36.7 C)    TempSrc:  Oral    SpO2: 95%  98% 100%  Weight:      Height:        Intake/Output Summary (Last 24 hours) at 04/14/2023 1142 Last data filed at 04/14/2023 1055 Gross per 24 hour  Intake 623.43 ml  Output 1350 ml  Net -726.57 ml    LABS: Basic Metabolic Panel: Recent Labs    04/14/23 0341 04/14/23 0824  NA 136 136  K 6.3* 5.3*  CL 107 109  CO2 20* 19*  GLUCOSE 84 80  BUN 60* 56*  CREATININE 3.71* 3.77*  CALCIUM 8.8* 9.0   Liver Function Tests: Recent Labs    04/12/23 2239  AST 70*  ALT 20  ALKPHOS 122  BILITOT 0.4  PROT 7.5  ALBUMIN 4.1   No results for input(s): "LIPASE", "AMYLASE" in the last 72 hours. CBC: Recent Labs    04/13/23 0708 04/14/23 0341  WBC 9.6 8.9  HGB 8.9* 9.1*  HCT 27.2* 28.2*  MCV 88.9 89.0  PLT 280 246   Cardiac Enzymes: No results for input(s): "CKTOTAL", "CKMB", "CKMBINDEX", "TROPONINI" in the last 72 hours. BNP: Invalid input(s): "POCBNP" D-Dimer: No results for input(s): "DDIMER" in the last 72 hours. Hemoglobin A1C: Recent Labs    04/13/23 0708  HGBA1C 5.5   Fasting Lipid Panel: Recent Labs    04/13/23 0708  CHOL 187  HDL 22*  LDLCALC UNABLE TO CALCULATE IF TRIGLYCERIDE OVER 400 mg/dL  TRIG 161*  CHOLHDL 8.5  LDLDIRECT 75   Thyroid Function Tests: No results for input(s): "TSH", "T4TOTAL", "T3FREE", "THYROIDAB" in the last 72 hours.  Invalid input(s): "FREET3" Anemia Panel: No results for input(s): "VITAMINB12", "FOLATE", "FERRITIN", "TIBC", "IRON", "RETICCTPCT" in the last 72 hours.   PHYSICAL EXAM General: Well developed, well nourished, in no acute distress HEENT:  Normocephalic and atramatic Neck:  No JVD.  Lungs: Clear bilaterally to  auscultation and percussion. Heart: HRRR . Normal S1 and S2 without gallops or murmurs.  Abdomen: Bowel sounds are positive, abdomen soft and non-tender  Msk:  Back normal, normal gait. Normal strength and tone for age. Extremities: No clubbing, cyanosis or edema.   Neuro: Alert and oriented X 3. Psych:  Good affect, responds appropriately  TELEMETRY: Normal sinus rhythm  ASSESSMENT AND PLAN: Status post inferior wall STEMI and status post PCI and stenting of the right coronary.  Patient is feeling much better denies any chest pain or shortness of breath.  Patient can be discharged with a follow-up 1 to 2 weeks and cardiology.  Need to take Brilinta and aspirin for 12 months uninterrupted.  Also continue high intensity statins.   ICD-10-CM   1. ST elevation myocardial infarction (STEMI), unspecified artery (HCC)  I21.3       Principal Problem:   STEMI involving right coronary artery (HCC) Active Problems:   Chronic pain   Hypertension   Gouty arthritis   Tobacco abuse   STEMI (ST elevation myocardial infarction) (HCC)   CKD (chronic kidney disease) stage 4, GFR 15-29 ml/min (HCC)    Adrian Blackwater, MD, [facility] 04/14/2023 11:42 AM

## 2023-04-14 NOTE — Progress Notes (Addendum)
       CROSS COVER NOTE  NAME: Nathan Ramos MRN: [id number] DOB : 02/17/1971    Concern from nurse Eyvonne Mechanic   good morning. His BMET resulted and the potassium level is elevated to 6.3      Pertinent findings on chart review: Last progress note "CKD 4 Patient followed by nephrology. Recent kidney function labs not available for comparison but patient says cr in the 3s "sounds right." - outpt f/u"    Latest Ref Rng & Units 04/14/2023    3:41 AM 04/13/2023    7:08 AM 04/12/2023   10:39 PM  BMP  Glucose 70 - 99 mg/dL 84  562  130   BUN 6 - 20 mg/dL 60  54  55   Creatinine 0.61 - 1.24 mg/dL 8.65  7.84  6.96   Sodium 135 - 145 mmol/L 136  135  134   Potassium 3.5 - 5.1 mmol/L 6.3  4.9  5.0   Chloride 98 - 111 mmol/L 107  108  105   CO2 22 - 32 mmol/L 20  18  20    Calcium 8.9 - 10.3 mg/dL 8.8  8.8  8.7      Assessment and  Interventions   Assessment: CKD4 with hyperkalemia and metabolic acidosis  Plan: EKG--> no T wave abnormalities Insulin and dextrose, calcium gluconate, Lokelma IV hydration with repeat BMP in 4 hours Nephrology consulted for recommendations X

## 2023-04-14 NOTE — Consult Note (Signed)
[facility]  CONSULT NOTE    Date: 04/14/2023                  Patient Name:  Nathan Ramos  MRN: [id number]  DOB: [birth year]  Age / Sex: 52 y.o., male         PCP: [facility], [place]                 Service Requesting Consult: Dr. Idelle Leech                 Reason for Consult: Acute Kidney Disease            History of Present Illness: Nathan Ramos was last seen by my practice in 06/2018. He was followed by Dr. Thedore Mins and was enrolled in clinical trial at [facility] but patient states he stopped going.   Patient had left hart catheterization on 5/30. Patient had stent placement. He states his chest pain has resolved.   Patient's son is at bedside. Patient wants to go home. He is complaining of the high potassium diet he is on. Found to have hyperkalemia and was treated.   Patient continues to smoke.    Medications: Outpatient medications: Medications Prior to Admission  Medication Sig Dispense Refill Last Dose   amLODipine (NORVASC) 10 MG tablet Take 1 tablet (10 mg total) by mouth daily. 90 tablet 1 04/12/2023   Febuxostat 80 MG TABS Take 1 tablet by mouth every other day.    04/12/2023   losartan (COZAAR) 50 MG tablet TAKE 1 TABLET BY MOUTH EVERY DAY 90 tablet 0 04/12/2023   oxyCODONE-acetaminophen (PERCOCET) 10-325 MG tablet Take 1 tablet by mouth every 6 (six) hours as needed for pain. Must last 30 days. 120 tablet 0 04/12/2023   atorvastatin (LIPITOR) 40 MG tablet Take 1 tablet (40 mg total) by mouth at bedtime. For cholesterol 30 tablet 5 More than a month   colchicine 0.6 MG tablet Take 1 tablet (0.6 mg total) by mouth daily. 30 tablet 1    furosemide (LASIX) 40 MG tablet Take 1 tablet (40 mg total) by mouth every other day. 45 tablet 1 More than a month   ibuprofen (ADVIL) 200 MG tablet Take 200 mg by mouth every 4 (four) hours as needed for moderate pain.   More than a month   naloxone (NARCAN) nasal spray 4 mg/0.1 mL Use in  case of accidental overdose with narcotics / opioids 1 kit 1 More than a month   oxyCODONE-acetaminophen (PERCOCET) 10-325 MG tablet Take 1 tablet by mouth every 6 (six) hours as needed for pain. Must last 30 days. 120 tablet 0    oxyCODONE-acetaminophen (PERCOCET) 10-325 MG tablet Take 1 tablet by mouth every 6 (six) hours as needed for pain. Must last 30 days. 120 tablet 0     Current medications: Current Facility-Administered Medications  Medication Dose Route Frequency Provider Last Rate Last Admin   0.9 %  sodium chloride infusion  250 mL Intravenous PRN Nolberto Hanlon, MD       oxyCODONE (Oxy IR/ROXICODONE) immediate release tablet 10 mg  10 mg Oral Q6H PRN Otelia Sergeant, RPH   10 mg at 04/14/23 1144   And   acetaminophen (TYLENOL) tablet 325 mg  325 mg Oral Q6H PRN Otelia Sergeant, RPH   325 mg at 04/13/23 7829   acetaminophen (TYLENOL) tablet 650 mg  650 mg Oral Q4H PRN Nolberto Hanlon, MD  650 mg at 04/14/23 1144   amLODipine (NORVASC) tablet 5 mg  5 mg Oral Daily Hudson, Caralyn, PA-C   5 mg at 04/14/23 1002   aspirin chewable tablet 81 mg  81 mg Oral Daily Nolberto Hanlon, MD   81 mg at 04/14/23 1001   atorvastatin (LIPITOR) tablet 80 mg  80 mg Oral Daily Nolberto Hanlon, MD   80 mg at 04/14/23 1001   Chlorhexidine Gluconate Cloth 2 % PADS 6 each  6 each Topical Daily Nolberto Hanlon, MD   6 each at 04/13/23 1330   enoxaparin (LOVENOX) injection 60 mg  60 mg Subcutaneous Q24H Nolberto Hanlon, MD   60 mg at 04/13/23 0906   febuxostat (ULORIC) tablet 80 mg  80 mg Oral Terrilyn Saver, MD   80 mg at 04/13/23 0906   [START ON 04/15/2023] irbesartan (AVAPRO) tablet 150 mg  150 mg Oral Daily Amri Lien, MD       metoprolol tartrate (LOPRESSOR) tablet 25 mg  25 mg Oral BID Hudson, Caralyn, PA-C   25 mg at 04/14/23 1002   nicotine (NICODERM CQ - dosed in mg/24 hours) patch 21 mg  21 mg Transdermal Daily Nolberto Hanlon, MD   21 mg at 04/14/23 1003   nicotine polacrilex (NICORETTE) gum 2 mg  2 mg Oral PRN Nolberto Hanlon, MD       ondansetron [facility]) injection 4 mg  4 mg Intravenous Q6H PRN Nolberto Hanlon, MD       sodium chloride flush (NS) 0.9 % injection 3 mL  3 mL Intravenous Q12H Nolberto Hanlon, MD   3 mL at 04/14/23 0930   sodium chloride flush (NS) 0.9 % injection 3 mL  3 mL Intravenous PRN Nolberto Hanlon, MD       sodium chloride flush (NS) 0.9 % injection 3 mL  3 mL Intravenous Q12H Nolberto Hanlon, MD   3 mL at 04/14/23 0930   sodium zirconium cyclosilicate (LOKELMA) packet 10 g  10 g Oral Once Willeen Niece, MD       ticagrelor (BRILINTA) tablet 90 mg  90 mg Oral BID Nolberto Hanlon, MD   90 mg at 04/14/23 1002   zolpidem (AMBIEN) tablet 5 mg  5 mg Oral QHS PRN,MR X 1 Nolberto Hanlon, MD   5 mg at 04/12/23 2244      Allergies: Allergies  Allergen Reactions   Nsaids Other (See Comments)    impaired renal function      Past Medical History: Past Medical History:  Diagnosis Date   Anemia    of chronic renal disease   Bone spur of other site    right shoulder, managed by ortho   Chronic pain    [name] CFNP [facility]   CKD (chronic kidney disease)    stage III 03/2014 (Dr. 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 Dr Mosetta Pigeon   Primary localized osteoarthritis of right knee    Smoker      Past Surgical History: Past Surgical History:  Procedure Laterality Date   CARPAL TUNNEL RELEASE Left    CATARACT EXTRACTION  W/ INTRAOCULAR LENS IMPLANT Left 2008   CATARACT EXTRACTION W/ INTRAOCULAR LENS IMPLANT Right 2008   EYE SURGERY     HERNIA REPAIR     JOINT REPLACEMENT Left 03/2014   TOTAL KNEE ARTHROPLASTY Left 03/30/2014   Procedure: TOTAL KNEE ARTHROPLASTY;  Surgeon: Nilda Simmer,  MD;  Location: [facility] OR;  Service: Orthopedics;  Laterality: Left;   TOTAL KNEE ARTHROPLASTY Right 10/26/2014   Procedure: TOTAL KNEE ARTHROPLASTY;  Surgeon: Nilda Simmer, MD;  Location: [facility] OR;  Service: Orthopedics;  Laterality: Right;   UMBILICAL HERNIA REPAIR  2010     Family History: Family History  Problem Relation Age of Onset   COPD Mother    Kidney disease Mother    Thyroid disease Mother    Stroke Father 69   COPD Maternal Grandmother      Social History: Social History   Socioeconomic History   Marital status: Married    Spouse name: Not on file   Number of children: 6   Years of education: Not on file   Highest education level: Bachelor's degree (e.g., BA, AB, BS)  Occupational History   Not on file  Tobacco Use   Smoking status: Every Day    Packs/day: 0.50    Years: 22.00    Additional pack years: 0.00    Total pack years: 11.00    Types: Cigarettes   Smokeless tobacco: Never  Vaping Use   Vaping Use: Never used  Substance and Sexual Activity   Alcohol use: No   Drug use: No   Sexual activity: Yes  Other Topics Concern   Not on file  Social History Narrative   Not on file   Social Determinants of Health   Financial Resource Strain: Low Risk  (01/16/2019)   Overall Financial Resource Strain (CARDIA)    Difficulty of Paying Living Expenses: Not hard at all  Food Insecurity: No Food Insecurity (01/16/2019)   Hunger Vital Sign    Worried About Running Out of Food in the Last Year: Never true    Ran Out of Food in the Last Year: Never true  Transportation Needs: No Transportation Needs (01/16/2019)   PRAPARE - Administrator, Civil Service (Medical): No    Lack of Transportation (Non-Medical): No  Physical Activity: Inactive (01/16/2019)   Exercise Vital Sign    Days of Exercise per Week: 0 days    Minutes of Exercise per Session: 0 min  Stress: No Stress Concern Present (01/16/2019)   [employer] of Occupational Health - Occupational  Stress Questionnaire    Feeling of Stress : Not at all  Social Connections: Somewhat Isolated (01/16/2019)   Social Connection and Isolation Panel [NHANES]    Frequency of Communication with Friends and Family: More than three times a week    Frequency of Social Gatherings with Friends and Family: More than three times a week    Attends Religious Services: Never    Database administrator or Organizations: No    Attends Banker Meetings: Never    Marital Status: Married  Catering manager Violence: Not At Risk (01/16/2019)   Humiliation, Afraid, Rape, and Kick questionnaire    Fear of Current or Ex-Partner: No    Emotionally Abused: No    Physically Abused: No    Sexually Abused: No       Vital Signs: Blood pressure 125/88, pulse 69, temperature 98.1 F (36.7 C), temperature source Oral, resp. rate 14, height 5\' 7"  (  1.702 m), weight 116.1 kg, SpO2 97 %.  Weight trends: Filed Weights   04/12/23 2200 04/12/23 2205  Weight: 116 kg 116.1 kg    Physical Exam: General: NAD, sitting up in bed  Head: Normocephalic, atraumatic. Moist oral mucosal membranes  Eyes: Anicteric, PERRL  Neck: Supple, trachea midline  Lungs:  Clear to auscultation  Heart: Regular rate and rhythm  Abdomen:  Soft, nontender,   Extremities:  no peripheral edema.  Neurologic: Nonfocal, moving all four extremities  Skin: No lesions         Lab results: Basic Metabolic Panel: Recent Labs  Lab 04/13/23 0708 04/14/23 0341 04/14/23 0824  NA 135 136 136  K 4.9 6.3* 5.3*  CL 108 107 109  CO2 18* 20* 19*  GLUCOSE 102* 84 80  BUN 54* 60* 56*  CREATININE 3.47* 3.71* 3.77*  CALCIUM 8.8* 8.8* 9.0    Liver Function Tests: Recent Labs  Lab 04/12/23 2239  AST 70*  ALT 20  ALKPHOS 122  BILITOT 0.4  PROT 7.5  ALBUMIN 4.1   No results for input(s): "LIPASE", "AMYLASE" in the last 168 hours. No results for input(s): "AMMONIA" in the last 168 hours.  CBC: Recent Labs  Lab 04/12/23 2239  04/13/23 0708 04/14/23 0341  WBC 10.9* 9.6 8.9  HGB 10.2* 8.9* 9.1*  HCT 31.2* 27.2* 28.2*  MCV 88.6 88.9 89.0  PLT 281 280 246    Cardiac Enzymes: No results for input(s): "CKTOTAL", "CKMB", "CKMBINDEX", "TROPONINI" in the last 168 hours.  BNP: Invalid input(s): "POCBNP"  CBG: Recent Labs  Lab 04/12/23 2206 04/13/23 0719  GLUCAP 185* 113*    Microbiology: Results for orders placed or performed during the hospital encounter of 04/12/23  MRSA Next Gen by PCR, Nasal     Status: None   Collection Time: 04/12/23 10:05 PM   Specimen: Nasal Mucosa; Nasal Swab  Result Value Ref Range Status   MRSA by PCR Next Gen NOT DETECTED NOT DETECTED Final    Comment: (NOTE) The GeneXpert MRSA Assay (FDA approved for NASAL specimens only), is one component of a comprehensive MRSA colonization surveillance program. It is not intended to diagnose MRSA infection nor to guide or monitor treatment for MRSA infections. Test performance is not FDA approved in patients less than 77 years old. Performed at [facility], [address] [place], [place] [address]     Coagulation Studies: No results for input(s): "LABPROT", "INR" in the last 72 hours.  Urinalysis: No results for input(s): "COLORURINE", "LABSPEC", "PHURINE", "GLUCOSEU", "HGBUR", "BILIRUBINUR", "KETONESUR", "PROTEINUR", "UROBILINOGEN", "NITRITE", "LEUKOCYTESUR" in the last 72 hours.  Invalid input(s): "APPERANCEUR"    Imaging: ECHOCARDIOGRAM COMPLETE  Result Date: 04/14/2023    ECHOCARDIOGRAM REPORT   Patient Name:   Nathan Ramos Date of Exam: 04/14/2023 Medical Rec #:  [id number]      Height:       67.0 in Accession #:    [id number]     Weight:       256.0 lb Date of Birth:  Jun 30, 1971      BSA:          2.246 m Patient Age:    51 years       BP:           127/81 mmHg Patient Gender: M              HR:           68 bpm. Exam Location:  [facility] Procedure: 2D Echo  and Intracardiac Opacification Agent Indications:     Acute  MI I21.9  History:         Patient has no prior history of Echocardiogram examinations.  Sonographer:     Overton Mam RDCS, FASE Referring Phys:  [id number] [facility] GOEL Diagnosing Phys: Adrian Blackwater  Sonographer Comments: Technically difficult study due to poor echo windows, suboptimal apical window and patient is obese. Image acquisition challenging due to patient body habitus. IMPRESSIONS  1. Left ventricular ejection fraction, by estimation, is 60 to 65%. The left ventricle has normal function. The left ventricle demonstrates regional wall motion abnormalities (see scoring diagram/findings for description). Left ventricular diastolic parameters are consistent with Grade I diastolic dysfunction (impaired relaxation).  2. Right ventricular systolic function is normal. The right ventricular size is normal.  3. Left atrial size was mildly dilated.  4. There is no evidence of cardiac tamponade.  5. The mitral valve is normal in structure. Mild mitral valve regurgitation. No evidence of mitral stenosis.  6. The aortic valve is normal in structure. Aortic valve regurgitation is not visualized. No aortic stenosis is present.  7. The inferior vena cava is normal in size with greater than 50% respiratory variability, suggesting right atrial pressure of 3 mmHg. FINDINGS  Left Ventricle: Left ventricular ejection fraction, by estimation, is 60 to 65%. The left ventricle has normal function. The left ventricle demonstrates regional wall motion abnormalities. Definity contrast agent was given IV to delineate the left ventricular endocardial borders. The left ventricular internal cavity size was normal in size. There is no left ventricular hypertrophy. Left ventricular diastolic parameters are consistent with Grade I diastolic dysfunction (impaired relaxation).  LV Wall Scoring: The entire inferior wall is hypokinetic. Right Ventricle: The right ventricular size is normal. No increase in right ventricular wall thickness. Right  ventricular systolic function is normal. Left Atrium: Left atrial size was mildly dilated. Right Atrium: Right atrial size was normal in size. Pericardium: Trivial pericardial effusion is present. The pericardial effusion is posterior to the left ventricle. There is no evidence of cardiac tamponade. Mitral Valve: The mitral valve is normal in structure. Mild mitral valve regurgitation. No evidence of mitral valve stenosis. Tricuspid Valve: The tricuspid valve is normal in structure. Tricuspid valve regurgitation is trivial. No evidence of tricuspid stenosis. Aortic Valve: The aortic valve is normal in structure. Aortic valve regurgitation is not visualized. No aortic stenosis is present. Aortic valve peak gradient measures 5.7 mmHg. Pulmonic Valve: The pulmonic valve was normal in structure. Pulmonic valve regurgitation is not visualized. No evidence of pulmonic stenosis. Aorta: The aortic root is normal in size and structure. Venous: The inferior vena cava is normal in size with greater than 50% respiratory variability, suggesting right atrial pressure of 3 mmHg. IAS/Shunts: No atrial level shunt detected by color flow Doppler.  LEFT VENTRICLE PLAX 2D LVIDd:         4.70 cm      Diastology LVIDs:         3.35 cm      LV e' medial:    9.68 cm/s LV PW:         1.25 cm      LV E/e' medial:  8.2 LV IVS:        1.30 cm      LV e' lateral:   10.80 cm/s LVOT diam:     2.40 cm      LV E/e' lateral: 7.4 LV SV:  82 LV SV Index:   37 LVOT Area:     4.52 cm  LV Volumes (MOD) LV vol d, MOD A2C: 183.0 ml LV vol d, MOD A4C: 153.0 ml LV vol s, MOD A2C: 62.4 ml LV vol s, MOD A4C: 54.5 ml LV SV MOD A2C:     120.6 ml LV SV MOD A4C:     153.0 ml LV SV MOD BP:      114.5 ml RIGHT VENTRICLE RV Basal diam:  3.40 cm RV S prime:     14.90 cm/s TAPSE (M-mode): 2.0 cm LEFT ATRIUM             Index        RIGHT ATRIUM           Index LA diam:        3.60 cm 1.60 cm/m   RA Area:     15.30 cm LA Vol (A2C):   27.6 ml 12.29 ml/m  RA  Volume:   40.40 ml  17.99 ml/m LA Vol (A4C):   18.2 ml 8.10 ml/m LA Biplane Vol: 23.0 ml 10.24 ml/m  AORTIC VALVE                 PULMONIC VALVE AV Area (Vmax): 3.76 cm     PV Vmax:       1.30 m/s AV Vmax:        119.00 cm/s  PV Peak grad:  6.8 mmHg AV Peak Grad:   5.7 mmHg LVOT Vmax:      99.00 cm/s LVOT Vmean:     62.600 cm/s LVOT VTI:       0.182 m  AORTA Ao Root diam: 3.40 cm Ao Asc diam:  3.20 cm MITRAL VALVE MV Area (PHT): 3.85 cm    SHUNTS MV Decel Time: 197 msec    Systemic VTI:  0.18 m MV E velocity: 79.70 cm/s  Systemic Diam: 2.40 cm MV A velocity: 78.60 cm/s MV E/A ratio:  1.01 Shaukat Khan Electronically signed by Adrian Blackwater Signature Date/Time: 04/14/2023/10:58:07 AM    Final    DG Chest Port 1 View  Result Date: 04/12/2023 CLINICAL DATA:  Chest pain EXAM: PORTABLE CHEST 1 VIEW COMPARISON:  CT from 10/08/2021 FINDINGS: Cardiac shadow is mildly enlarged in size. Mild central vascular congestion is noted. No focal infiltrate or effusion is seen. No bony abnormality is noted. IMPRESSION: Mild vascular congestion. Electronically Signed   By: Alcide Clever M.D.   On: 04/12/2023 23:34     Assessment & Plan: Mr. JHAEL SHIPPEE is a 51 y.o. white male with hypertension, coronary artery disease, gout, chronic pain syndrome, hyperlipidemia, and medullary cystic kidney disease who was admitted to [facility] on 04/12/2023 for STEMI involving right coronary artery (HCC) [I21.11] STEMI (ST elevation myocardial infarction) (HCC) [I21.3]  Acute Kidney Injury with Hyperkalemia on Chronic kidney disease stage IIIB. Baseline creatinine of 2.54, GFR of 30 in 06/2018. 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  Acute versus progression of kidney disease - change losartan to irbesartan due to hyperkalemia - patient will benefit from restarting furosemide - patient will need close follow up with nephrology on discharge.  - recommend avoiding all NSAIDs including ibuprofen  Hypertension  with chronic kidney disease: restart amlodipine and furosemide on discharge. Irbesartan as above.   Anemia with chronic kidney disease: Hemoglobin 9.1. Will need anemia work up.      LOS: 2 Marin Milley 6/1/20242:25 PM

## 2023-04-14 NOTE — Plan of Care (Signed)
Continuing with plan of care. 

## 2023-04-14 NOTE — Progress Notes (Signed)
Report given to receiving nurse Isabelle Course for room 231.  Patient to transfer to room once room is clean.

## 2023-04-16 LAB — CARDIAC CATHETERIZATION: Cath EF Quantitative: 50 %

## 2023-04-17 ENCOUNTER — Ambulatory Visit (INDEPENDENT_AMBULATORY_CARE_PROVIDER_SITE_OTHER): Payer: Medicare HMO | Admitting: Cardiovascular Disease

## 2023-04-17 ENCOUNTER — Encounter: Payer: Self-pay | Admitting: Cardiovascular Disease

## 2023-04-17 VITALS — BP 144/90 | HR 85 | Ht 69.5 in | Wt 253.6 lb

## 2023-04-17 DIAGNOSIS — I340 Nonrheumatic mitral (valve) insufficiency: Secondary | ICD-10-CM

## 2023-04-17 DIAGNOSIS — I2111 ST elevation (STEMI) myocardial infarction involving right coronary artery: Secondary | ICD-10-CM

## 2023-04-17 DIAGNOSIS — Q615 Medullary cystic kidney: Secondary | ICD-10-CM | POA: Insufficient documentation

## 2023-04-17 DIAGNOSIS — I151 Hypertension secondary to other renal disorders: Secondary | ICD-10-CM | POA: Diagnosis not present

## 2023-04-17 DIAGNOSIS — I5189 Other ill-defined heart diseases: Secondary | ICD-10-CM | POA: Diagnosis not present

## 2023-04-17 DIAGNOSIS — N184 Chronic kidney disease, stage 4 (severe): Secondary | ICD-10-CM | POA: Insufficient documentation

## 2023-04-17 DIAGNOSIS — I2510 Atherosclerotic heart disease of native coronary artery without angina pectoris: Secondary | ICD-10-CM | POA: Diagnosis not present

## 2023-04-17 DIAGNOSIS — M1A9XX1 Chronic gout, unspecified, with tophus (tophi): Secondary | ICD-10-CM | POA: Insufficient documentation

## 2023-04-17 DIAGNOSIS — E875 Hyperkalemia: Secondary | ICD-10-CM | POA: Insufficient documentation

## 2023-04-17 LAB — MISC LABCORP TEST (SEND OUT): Labcorp test code: 83935

## 2023-04-17 LAB — LIPOPROTEIN A (LPA): Lipoprotein (a): 14.6 nmol/L (ref <75.0)

## 2023-04-17 LAB — HCV AB W REFLEX TO QUANT PCR: HCV Ab: NONREACTIVE

## 2023-04-17 LAB — HCV INTERPRETATION

## 2023-04-17 MED ORDER — ROSUVASTATIN CALCIUM 20 MG PO TABS
20.0000 mg | ORAL_TABLET | Freq: Every day | ORAL | 1 refills | Status: DC
Start: 2023-04-17 — End: 2023-06-21

## 2023-04-17 NOTE — Progress Notes (Signed)
Cardiology Office Note   Date:  04/17/2023   ID:  Nathan Ramos, DOB 1971/07/25, MRN [id number]  PCP:  [facility]  Cardiologist:  Adrian Blackwater, MD      History of Present Illness: Nathan Ramos is a 52 y.o. male who presents for  Chief Complaint  Patient presents with   [facility] cath 5/30    Patient in office for hospital follow up. Denies chest pain, shortness of breath, dizziness.     Past Medical History:  Diagnosis Date   Anemia    of chronic renal disease   Bone spur of other site    right shoulder, managed by ortho   Chronic pain    [name] CFNP at [facility]   CKD (chronic kidney disease)    stage III 03/2014 (Dr. 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 Dr Mosetta Pigeon   Primary localized osteoarthritis of right knee    Smoker      Past Surgical History:  Procedure Laterality Date   CARPAL TUNNEL RELEASE Left    CATARACT EXTRACTION W/ INTRAOCULAR LENS IMPLANT Left 2008   CATARACT EXTRACTION W/ INTRAOCULAR LENS IMPLANT Right 2008   EYE SURGERY     HERNIA REPAIR     JOINT REPLACEMENT Left 03/2014   TOTAL KNEE ARTHROPLASTY Left 03/30/2014   Procedure: TOTAL KNEE ARTHROPLASTY;  Surgeon: Nilda Simmer, MD;  Location: [facility] OR;  Service: Orthopedics;  Laterality: Left;   TOTAL KNEE ARTHROPLASTY Right 10/26/2014   Procedure: TOTAL KNEE ARTHROPLASTY;  Surgeon: Nilda Simmer, MD;  Location: [facility] OR;  Service: Orthopedics;  Laterality: Right;   UMBILICAL HERNIA REPAIR  2010     Current Outpatient Medications  Medication Sig Dispense Refill   amLODipine (NORVASC) 5 MG tablet Take 1 tablet (5 mg total) by mouth daily.  30 tablet 1   aspirin 81 MG chewable tablet Chew 1 tablet (81 mg total) by mouth daily. 30 tablet 11   Febuxostat 80 MG TABS Take 1 tablet by mouth every other day.      irbesartan (AVAPRO) 150 MG tablet Take 1 tablet (150 mg total) by mouth daily. 30 tablet 1   metoprolol tartrate (LOPRESSOR) 25 MG tablet Take 1 tablet (25 mg total) by mouth 2 (two) times daily. 60 tablet 1   naloxone (NARCAN) nasal spray 4 mg/0.1 mL Use in case of accidental overdose with narcotics / opioids 1 kit 1   rosuvastatin (CRESTOR) 20 MG tablet Take 1 tablet (20 mg total) by mouth daily. 90 tablet 1   ticagrelor (BRILINTA) 90 MG TABS tablet Take 1 tablet (90 mg total) by mouth 2 (two) times daily. 60 tablet 2   No current facility-administered medications for this visit.    Allergies:   Nsaids    Social History:   reports that he has been smoking cigarettes. He has a 11.00 pack-year smoking history. He has never used smokeless tobacco. He reports that he does not drink alcohol and does not use drugs.   Family History:  family history includes COPD  in his maternal grandmother and mother; Kidney disease in his mother; Stroke (age of onset: 62) in his father; Thyroid disease in his mother.    ROS:     Review of Systems  Constitutional: Negative.   HENT: Negative.    Eyes: Negative.   Respiratory: Negative.    Cardiovascular: Negative.   Gastrointestinal: Negative.   Genitourinary: Negative.   Musculoskeletal: Negative.   Skin: Negative.   Neurological: Negative.   Endo/Heme/Allergies: Negative.   Psychiatric/Behavioral: Negative.    All other systems reviewed and are negative.    All other systems are reviewed and negative.    PHYSICAL EXAM: VS:  BP (!) 144/90   Pulse 85   Ht 5' 9.5" (1.765 m)   Wt 253 lb 9.6 oz (115 kg)   SpO2 97%   BMI 36.91 kg/m  , BMI Body mass index is 36.91 kg/m. Last weight:  Wt Readings from Last 3 Encounters:  04/17/23 253 lb 9.6 oz (115 kg)  04/12/23 255 lb 15.3  oz (116.1 kg)  01/25/23 255 lb (115.7 kg)    Physical Exam Vitals reviewed.  Constitutional:      Appearance: Normal appearance. He is normal weight.  HENT:     Head: Normocephalic.     Nose: Nose normal.     Mouth/Throat:     Mouth: Mucous membranes are moist.  Eyes:     Pupils: Pupils are equal, round, and reactive to light.  Cardiovascular:     Rate and Rhythm: Normal rate and regular rhythm.     Pulses: Normal pulses.     Heart sounds: Normal heart sounds.  Pulmonary:     Effort: Pulmonary effort is normal.  Abdominal:     General: Abdomen is flat. Bowel sounds are normal.  Musculoskeletal:        General: Normal range of motion.     Cervical back: Normal range of motion.  Skin:    General: Skin is warm.  Neurological:     General: No focal deficit present.     Mental Status: He is alert.  Psychiatric:        Mood and Affect: Mood normal.     EKG: none today  Recent Labs: 04/12/2023: ALT 20 04/14/2023: BUN 56; Creatinine, Ser 3.77; Hemoglobin 9.1; Platelets 246; Potassium 5.3; Sodium 136    Lipid Panel    Component Value Date/Time   CHOL 187 04/13/2023 0708   CHOL 139 10/16/2017 0000   TRIG 445 (H) 04/13/2023 0708   HDL 22 (L) 04/13/2023 0708   HDL 23 (L) 10/16/2017 0000   CHOLHDL 8.5 04/13/2023 0708   VLDL UNABLE TO CALCULATE IF TRIGLYCERIDE OVER 400 mg/dL 16/08/9603 5409   LDLCALC UNABLE TO CALCULATE IF TRIGLYCERIDE OVER 400 mg/dL 81/19/1478 2956   LDLCALC  10/23/2018 1037     Comment:     . LDL cholesterol not calculated. Triglyceride levels greater than 400 mg/dL invalidate calculated LDL results. . Reference range: <100 . Desirable range <100 mg/dL for primary prevention;   <70 mg/dL for patients with CHD or diabetic patients  with > or = 2 CHD risk factors. Marland Kitchen LDL-C is now calculated using the Martin-Hopkins  calculation, which is a validated novel method providing  better accuracy than the Friedewald equation in the  estimation of LDL-C.   Horald Pollen et al. Lenox Ahr. 2130;865(78): 2061-2068  (http://education.Quest[employer].com/faq/FAQ164)    LDLDIRECT 75 04/13/2023 0708      ASSESSMENT AND PLAN:    ICD-10-CM   1. Hypertension  secondary to other renal disorders  I15.1     2. Coronary artery disease involving native coronary artery of native heart without angina pectoris  I25.10 rosuvastatin (CRESTOR) 20 MG tablet    Amb Referral to Cardiac Rehabilitation    3. Diastolic dysfunction  I51.89     4. Nonrheumatic mitral valve regurgitation  I34.0     5. Morbid obesity (HCC)  E66.01     6. STEMI involving right coronary artery (HCC)  I21.11 Amb Referral to Cardiac Rehabilitation       Problem List Items Addressed This Visit       Cardiovascular and Mediastinum   Hypertension - Primary   Relevant Medications   rosuvastatin (CRESTOR) 20 MG tablet   STEMI involving right coronary artery (HCC)   Relevant Medications   rosuvastatin (CRESTOR) 20 MG tablet   Other Relevant Orders   Amb Referral to Cardiac Rehabilitation   Coronary artery disease involving native coronary artery of native heart without angina pectoris    03/2023 Presented to ED via EMS code STEMI Coronaries Left main large minor irregularities LAD large 50% mid LAD bifurcation with diagonal 50% Circumflex large minor irregularities RCA large 100% occluded PL TIMI 0 flow IRA   Intervention Successful PCI and stent of the PL 3.0 x 15 mm frontier Onyx to 14 atm lesion reduced from 100 down to 0% TIMI-3 flow restored from TIMI 0      Relevant Medications   rosuvastatin (CRESTOR) 20 MG tablet   Other Relevant Orders   Amb Referral to Cardiac Rehabilitation   Nonrheumatic mitral valve regurgitation    Echo 04/2023 mild MVR      Relevant Medications   rosuvastatin (CRESTOR) 20 MG tablet     Other   Morbid obesity (HCC)    The patient is asked to make an attempt to improve diet and exercise patterns to aid in medical management of this problem.       Diastolic dysfunction    Echo 04/2023 normal EF grade I DD        Disposition:   Return in about 4 weeks (around 05/15/2023).    Total time spent: 30 minutes  Signed,  Adrian Blackwater, MD  04/17/2023 10:21 AM    [facility]

## 2023-04-17 NOTE — Assessment & Plan Note (Signed)
The patient is asked to make an attempt to improve diet and exercise patterns to aid in medical management of this problem.  

## 2023-04-17 NOTE — Assessment & Plan Note (Signed)
Echo 04/2023 normal EF grade I DD

## 2023-04-17 NOTE — Assessment & Plan Note (Signed)
03/2023 Presented to ED via EMS code STEMI Coronaries Left main large minor irregularities LAD large 50% mid LAD bifurcation with diagonal 50% Circumflex large minor irregularities RCA large 100% occluded PL TIMI 0 flow IRA   Intervention Successful PCI and stent of the PL 3.0 x 15 mm frontier Onyx to 14 atm lesion reduced from 100 down to 0% TIMI-3 flow restored from TIMI 0

## 2023-04-17 NOTE — Assessment & Plan Note (Signed)
Echo 04/2023 mild MVR

## 2023-04-18 DIAGNOSIS — E875 Hyperkalemia: Secondary | ICD-10-CM | POA: Diagnosis not present

## 2023-04-18 DIAGNOSIS — N184 Chronic kidney disease, stage 4 (severe): Secondary | ICD-10-CM | POA: Diagnosis not present

## 2023-04-18 DIAGNOSIS — I129 Hypertensive chronic kidney disease with stage 1 through stage 4 chronic kidney disease, or unspecified chronic kidney disease: Secondary | ICD-10-CM | POA: Diagnosis not present

## 2023-04-18 DIAGNOSIS — M1A00X1 Idiopathic chronic gout, unspecified site, with tophus (tophi): Secondary | ICD-10-CM | POA: Diagnosis not present

## 2023-04-18 DIAGNOSIS — Q615 Medullary cystic kidney: Secondary | ICD-10-CM | POA: Diagnosis not present

## 2023-04-26 ENCOUNTER — Ambulatory Visit
Payer: Medicare HMO | Attending: Student in an Organized Health Care Education/Training Program | Admitting: Student in an Organized Health Care Education/Training Program

## 2023-04-26 ENCOUNTER — Encounter: Payer: Self-pay | Admitting: Student in an Organized Health Care Education/Training Program

## 2023-04-26 VITALS — BP 126/80 | HR 74 | Temp 98.4°F | Resp 18 | Ht 70.0 in | Wt 253.0 lb

## 2023-04-26 DIAGNOSIS — G5603 Carpal tunnel syndrome, bilateral upper limbs: Secondary | ICD-10-CM | POA: Diagnosis not present

## 2023-04-26 DIAGNOSIS — Q615 Medullary cystic kidney: Secondary | ICD-10-CM | POA: Diagnosis not present

## 2023-04-26 DIAGNOSIS — M25531 Pain in right wrist: Secondary | ICD-10-CM | POA: Insufficient documentation

## 2023-04-26 DIAGNOSIS — Z79899 Other long term (current) drug therapy: Secondary | ICD-10-CM | POA: Diagnosis not present

## 2023-04-26 DIAGNOSIS — M79641 Pain in right hand: Secondary | ICD-10-CM | POA: Diagnosis not present

## 2023-04-26 DIAGNOSIS — M1A39X1 Chronic gout due to renal impairment, multiple sites, with tophus (tophi): Secondary | ICD-10-CM | POA: Insufficient documentation

## 2023-04-26 DIAGNOSIS — Z96653 Presence of artificial knee joint, bilateral: Secondary | ICD-10-CM | POA: Insufficient documentation

## 2023-04-26 DIAGNOSIS — M170 Bilateral primary osteoarthritis of knee: Secondary | ICD-10-CM | POA: Diagnosis not present

## 2023-04-26 DIAGNOSIS — G894 Chronic pain syndrome: Secondary | ICD-10-CM | POA: Diagnosis not present

## 2023-04-26 MED ORDER — OXYCODONE-ACETAMINOPHEN 10-325 MG PO TABS: 1.0000 | ORAL_TABLET | Freq: Four times a day (QID) | ORAL | 0 refills | Status: AC | PRN

## 2023-04-26 MED ORDER — OXYCODONE-ACETAMINOPHEN 10-325 MG PO TABS: 1.0000 | ORAL_TABLET | Freq: Four times a day (QID) | ORAL | 0 refills | Status: DC | PRN

## 2023-04-26 NOTE — Progress Notes (Signed)
PROVIDER NOTE: Information contained herein reflects review and annotations entered in association with encounter. Interpretation of such information and data should be left to medically-trained personnel. Information provided to patient can be located elsewhere in the medical record under "Patient Instructions". Document created using STT-dictation technology, any transcriptional errors that may result from process are unintentional.    Patient: Nathan Ramos  Service Category: E/M  Provider: [name], MD  DOB: [birth year]  DOS: 04/26/2023  Specialty: Interventional Pain Management  MRN: [id number]  Setting: Ambulatory outpatient  PCP: [facility]  Type: Established Patient    Referring Provider: [facility]  Location: Office  Delivery: Face-to-face     HPI  [name], a 52 y.o. year old male, is here today because of his Chronic pain syndrome [G89.4]. Mr. Mandich primary complain today is Joint Pain  Last encounter: My last encounter with him was on 01/25/2023  Pertinent problems: Nathan Ramos has Medullary cystic disease of the kidney; Chronic pain; Gout due to renal impairment; Primary localized osteoarthritis of right knee; Chronic pain syndrome; and Opioid use on their pertinent problem list. Pain Assessment: Severity of Chronic pain is reported as a 6 /10. Location: Other (Comment) (joint)  / . Onset: More than a month ago. Quality:  . Timing: Constant. Modifying factor(s): meds. Vitals:  height is 5\' 10"  (1.778 m) and weight is 253 lb (114.8 kg). His temporal temperature is 98.4 F (36.9 C). His blood pressure is 126/80 and his pulse is 74. His respiration is 18 and oxygen saturation is 100%.   Reason for encounter: medication management.    Since Stacy's last visit with me, he unfortunately had a heart attack and had to have a coronary stent placed.  He is currently on Brilinta.  He states that his experience with the interventional cardiologist was  not very pleasant. Otherwise we will refill his oxycodone as prescribed, no change in dose.  Patient does have Narcan both at home and in his car.  Pharmacotherapy Assessment   10/04/2022 07/18/2022  2 Oxycodone-Acetaminophen 10-325 120.00 30 Bi Lat [id number] Nor (2541) 0/0 60.00 MME Medicare [place]    Pharmacotherapy Assessment  Analgesic: Percocet 10 mg every 6 hours daily as needed, quantity 120/month, MME 60,    Monitoring: [place] PMP: PDMP reviewed during this encounter.       Pharmacotherapy: No side-effects or adverse reactions reported. Compliance: No problems identified. Effectiveness: Clinically acceptable.  UDS:  Summary  Date Value Ref Range Status  01/25/2023 Note  Final    Comment:    ==================================================================== [employer] Select 13 (MW) ==================================================================== Test                             Result       Flag       Units  Drug Present and Declared for Prescription Verification   Oxycodone                      1467         EXPECTED   ng/mg creat   Oxymorphone                    2807         EXPECTED   ng/mg creat   Noroxycodone                   1041  EXPECTED   ng/mg creat   Noroxymorphone                 468          EXPECTED   ng/mg creat    Sources of oxycodone are scheduled prescription medications.    Oxymorphone, noroxycodone, and noroxymorphone are expected    metabolites of oxycodone. Oxymorphone is also available as a    scheduled prescription medication.  ==================================================================== Test                      Result    Flag   Units      Ref Range   Creatinine              92               mg/dL      >=16 ==================================================================== Declared Medications:  The flagging and interpretation on this report are based on the  following declared medications.  Unexpected results may arise from   inaccuracies in the declared medications.   **Note: The testing scope of this panel includes these medications:   Oxycodone (Percocet)   **Note: The testing scope of this panel does not include the  following reported medications:   Acetaminophen (Percocet)  Amlodipine (Norvasc)  Atorvastatin (Lipitor)  Colchicine  Febuxostat  Furosemide (Lasix)  Ibuprofen (Advil)  Losartan (Cozaar)  Naloxone (Narcan) ==================================================================== For clinical consultation, please call [phone]. ====================================================================       ROS  Constitutional: Denies any fever or chills Gastrointestinal: No reported hemesis, hematochezia, vomiting, or acute GI distress Musculoskeletal: Diffuse arthralgias and myalgias Neurological: No reported episodes of acute onset apraxia, aphasia, dysarthria, agnosia, amnesia, paralysis, loss of coordination, or loss of consciousness  Medication Review  Febuxostat, amLODipine, aspirin, irbesartan, metoprolol tartrate, naloxone, oxyCODONE-acetaminophen, rosuvastatin, and ticagrelor  History Review  Allergy: Mr. Conrow is allergic to nsaids. Drug: Mr. Whiteaker  reports no history of drug use. Alcohol:  reports no history of alcohol use. Tobacco:  reports that he has quit smoking. His smoking use included cigarettes. He has a 11.00 pack-year smoking history. He has never used smokeless tobacco. Social: Mr. Dashnaw  reports that he has quit smoking. His smoking use included cigarettes. He has a 11.00 pack-year smoking history. He has never used smokeless tobacco. He reports that he does not drink alcohol and does not use drugs. Medical:  has a past medical history of Anemia, Bone spur of other site, Chronic pain, CKD (chronic kidney disease), Controlled substance agreement signed, Gout, Gouty arthritis, History of YAG laser capsulotomy of lens of left eye, Hyperlipidemia, Hypertension, IFG  (impaired fasting glucose), Left knee DJD, Medullary cystic disease of the kidney, Primary localized osteoarthritis of right knee, and Smoker. Surgical: Mr. Warfel  has a past surgical history that includes Carpal tunnel release (Left); Umbilical hernia repair (2010); Cataract extraction w/ intraocular lens implant (Left, 2008); Cataract extraction w/ intraocular lens implant (Right, 2008); Hernia repair; Total knee arthroplasty (Left, 03/30/2014); Eye surgery; Total knee arthroplasty (Right, 10/26/2014); Joint replacement (Left, 03/2014); Coronary/Graft Acute MI Revascularization (N/A, 04/12/2023); and LEFT HEART CATH AND CORONARY ANGIOGRAPHY (N/A, 04/12/2023). Family: family history includes COPD in his maternal grandmother and mother; Kidney disease in his mother; Stroke (age of onset: 51) in his father; Thyroid disease in his mother.  Laboratory Chemistry Profile   Renal Lab Results  Component Value Date   BUN 56 (H) 04/14/2023   CREATININE 3.77 (H) 04/14/2023   BCR 15  06/01/2020   GFRAA 27 (L) 06/01/2020   GFRNONAA 19 (L) 04/14/2023     Hepatic Lab Results  Component Value Date   AST 70 (H) 04/12/2023   ALT 20 04/12/2023   ALBUMIN 4.1 04/12/2023   ALKPHOS 122 04/12/2023     Electrolytes Lab Results  Component Value Date   NA 136 04/14/2023   K 5.3 (H) 04/14/2023   CL 109 04/14/2023   CALCIUM 9.0 04/14/2023     Bone No results found for: "VD25OH", "VD125OH2TOT", "ZO1096EA5", "WU9811BJ4", "25OHVITD1", "25OHVITD2", "25OHVITD3", "TESTOFREE", "TESTOSTERONE"   Inflammation (CRP: Acute Phase) (ESR: Chronic Phase) No results found for: "CRP", "ESRSEDRATE", "LATICACIDVEN"     Note: Above Lab results reviewed.  Recent Imaging Review  CARDIAC CATHETERIZATION   RPAV lesion is 100% stenosed.   Mid LAD lesion is 50% stenosed.   2nd Diag lesion is 50% stenosed.   A stent was successfully placed.   Post intervention, there is a 0% residual stenosis.   There is mild left ventricular  systolic dysfunction.   LV end diastolic pressure is normal.   The left ventricular ejection fraction is 45-50% by visual estimate.   There is no mitral valve regurgitation.  Conclusion  STEMI presentation inferior wall Left ventricular function mildly depressed with inferior hypokinesis  ejection fraction around 45 to 50%  Coronaries Left main large minor irregularities LAD large 50% mid LAD bifurcation with diagonal 50% Circumflex large minor irregularities RCA large 100% occluded PL TIMI 0 flow IRA  Intervention Successful PCI and stent of the PL 3.0 x 15 mm frontier Onyx to 14 atm  lesion reduced from 100 down to 0% TIMI-3 flow restored from TIMI 0  No complications Patient tolerated procedure well Recommend aspirin Brilinta for 12 months Patient transferred to ICU postprocedure  Note: Reviewed        Physical Exam  General appearance: alert, cooperative, and in mild distress presents today in wheelchair Mental status: Alert, oriented x 3 (person, place, & time)       Respiratory: No evidence of acute respiratory distress Eyes: PERLA Vitals: BP 126/80   Pulse 74   Temp 98.4 F (36.9 C) (Temporal)   Resp 18   Ht 5\' 10"  (1.778 m)   Wt 253 lb (114.8 kg)   SpO2 100%   BMI 36.30 kg/m  BMI: Estimated body mass index is 36.3 kg/m as calculated from the following:   Height as of this encounter: 5\' 10"  (1.778 m).   Weight as of this encounter: 253 lb (114.8 kg). Ideal: Ideal body weight: 73 kg (160 lb 15 oz) Adjusted ideal body weight: 89.7 kg (197 lb 12.2 oz)  Cervicalgia Bilatral knee pain   Assessment   Status Diagnosis  Controlled Controlled Controlled 1. Chronic pain syndrome   2. History of bilateral knee replacement   3. Bilateral carpal tunnel syndrome   4. 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 Right hand pain   6. Primary osteoarthritis of both knees   7. Medullary cystic disease of the kidney   8. Right wrist pain    9. Controlled substance agreement signed           Plan of Care   Mr. PAYCE DIETZLER has a current medication list which includes the following long-term medication(s): amlodipine, irbesartan, metoprolol tartrate, and rosuvastatin.   Pharmacotherapy (Medications Ordered): Meds ordered this encounter  Medications   oxyCODONE-acetaminophen (PERCOCET) 10-325 MG tablet    Sig: Take 1  tablet by mouth every 6 (six) hours as needed for pain.    Dispense:  120 tablet    Refill:  0   oxyCODONE-acetaminophen (PERCOCET) 10-325 MG tablet    Sig: Take 1 tablet by mouth every 6 (six) hours as needed for pain.    Dispense:  120 tablet    Refill:  0   oxyCODONE-acetaminophen (PERCOCET) 10-325 MG tablet    Sig: Take 1 tablet by mouth every 6 (six) hours as needed for pain.    Dispense:  120 tablet    Refill:  0    Follow-up plan:   Return in about 3 months (around 07/27/2023) for Medication Management, in person.   Recent Visits No visits were found meeting these conditions. Showing recent visits within past 90 days and meeting all other requirements Today's Visits Date Type Provider Dept  04/26/23 Office Visit [name], MD [facility]  Showing today's visits and meeting all other requirements Future Appointments Date Type Provider Dept  07/19/23 Appointment [name], MD [facility]  Showing future appointments within next 90 days and meeting all other requirements  I discussed the assessment and treatment plan with the patient. The patient was provided an opportunity to ask questions and all were answered. The patient agreed with the plan and demonstrated an understanding of the instructions.  Patient advised to call back or seek an in-person evaluation if the symptoms or condition worsens.  Duration of encounter: 30 minutes.  Note by: [name], MD Date: 04/26/2023; Time: 11:32 AM

## 2023-04-26 NOTE — Progress Notes (Signed)
Nursing Pain Medication Assessment:  Safety precautions to be maintained throughout the outpatient stay will include: orient to surroundings, keep bed in low position, maintain call bell within reach at all times, provide assistance with transfer out of bed and ambulation.  Medication Inspection Compliance: Pill count conducted under aseptic conditions, in front of the patient. Neither the pills nor the bottle was removed from the patient's sight at any time. Once count was completed pills were immediately returned to the patient in their original bottle.  Medication: Oxycodone/APAP Pill/Patch Count:  39 of 120 pills remain Pill/Patch Appearance: Markings consistent with prescribed medication Bottle Appearance: Standard pharmacy container. Clearly labeled. Filled Date: 5 / 21 / 2024 Last Medication intake:  Today

## 2023-05-21 ENCOUNTER — Ambulatory Visit (INDEPENDENT_AMBULATORY_CARE_PROVIDER_SITE_OTHER): Payer: Medicare HMO | Admitting: Cardiovascular Disease

## 2023-05-21 ENCOUNTER — Encounter: Payer: Self-pay | Admitting: Cardiovascular Disease

## 2023-05-21 VITALS — BP 160/74 | HR 95 | Ht 70.0 in | Wt 245.6 lb

## 2023-05-21 DIAGNOSIS — N184 Chronic kidney disease, stage 4 (severe): Secondary | ICD-10-CM | POA: Diagnosis not present

## 2023-05-21 DIAGNOSIS — I2111 ST elevation (STEMI) myocardial infarction involving right coronary artery: Secondary | ICD-10-CM

## 2023-05-21 DIAGNOSIS — I5189 Other ill-defined heart diseases: Secondary | ICD-10-CM | POA: Diagnosis not present

## 2023-05-21 DIAGNOSIS — I2510 Atherosclerotic heart disease of native coronary artery without angina pectoris: Secondary | ICD-10-CM

## 2023-05-21 DIAGNOSIS — E781 Pure hyperglyceridemia: Secondary | ICD-10-CM

## 2023-05-21 DIAGNOSIS — I340 Nonrheumatic mitral (valve) insufficiency: Secondary | ICD-10-CM | POA: Diagnosis not present

## 2023-05-21 DIAGNOSIS — I151 Hypertension secondary to other renal disorders: Secondary | ICD-10-CM

## 2023-05-21 MED ORDER — AMLODIPINE BESYLATE 10 MG PO TABS
10.0000 mg | ORAL_TABLET | Freq: Every day | ORAL | 11 refills | Status: DC
Start: 2023-05-21 — End: 2024-06-24

## 2023-05-21 NOTE — Progress Notes (Signed)
Cardiology Office Note   Date:  05/21/2023   ID:  JOSHUALEE Ramos, DOB [birth year], MRN [id number]  PCP:  [facility] [place], [place]  Cardiologist:  Adrian Blackwater, MD      History of Present Illness: Nathan Ramos is a 52 y.o. male who presents for  Chief Complaint  Patient presents with   Follow-up    1 Month Follow Up     Feeling fine, but BP high      Past Medical History:  Diagnosis Date   Anemia    of chronic renal disease   Bone spur of other site    right shoulder, managed by ortho   Chronic pain    Nathan Ramos at [facility]   CKD (chronic kidney disease)    stage III 03/2014 (Dr. 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 Dr Mosetta Pigeon   Primary localized osteoarthritis of right knee    Smoker      Past Surgical History:  Procedure Laterality Date   CARPAL TUNNEL RELEASE Left    CATARACT EXTRACTION W/ INTRAOCULAR LENS IMPLANT Left 2008   CATARACT EXTRACTION W/ INTRAOCULAR LENS IMPLANT Right 2008   CORONARY/GRAFT ACUTE MI REVASCULARIZATION N/A 04/12/2023   Procedure: Coronary/Graft Acute MI Revascularization;  Surgeon: [name], MD;  Location: [facility] INVASIVE CV LAB;  Service: Cardiovascular;  Laterality: N/A;   EYE SURGERY     HERNIA REPAIR     JOINT REPLACEMENT Left 03/2014   LEFT HEART CATH AND CORONARY ANGIOGRAPHY N/A 04/12/2023   Procedure: LEFT HEART CATH AND CORONARY ANGIOGRAPHY;  Surgeon: [name], MD;  Location: [facility] INVASIVE CV LAB;  Service: Cardiovascular;  Laterality: N/A;   TOTAL KNEE ARTHROPLASTY Left 03/30/2014   Procedure: TOTAL KNEE ARTHROPLASTY;  Surgeon: Nilda Simmer, MD;  Location: [facility] OR;  Service:  Orthopedics;  Laterality: Left;   TOTAL KNEE ARTHROPLASTY Right 10/26/2014   Procedure: TOTAL KNEE ARTHROPLASTY;  Surgeon: Nilda Simmer, MD;  Location: [facility] OR;  Service: Orthopedics;  Laterality: Right;   UMBILICAL HERNIA REPAIR  2010     Current Outpatient Medications  Medication Sig Dispense Refill   amLODipine (NORVASC) 10 MG tablet Take 1 tablet (10 mg total) by mouth daily. 30 tablet 11   atorvastatin (LIPITOR) 80 MG tablet Take 80 mg by mouth daily.     aspirin 81 MG chewable tablet Chew 1 tablet (81 mg total) by mouth daily. 30 tablet 11   Febuxostat 80 MG TABS Take 1 tablet by mouth every other day.      irbesartan (AVAPRO) 150 MG tablet Take 1 tablet (150 mg total) by mouth daily. 30 tablet 1   metoprolol tartrate (LOPRESSOR) 25 MG tablet Take 1 tablet (25 mg total) by mouth 2 (two) times daily. 60 tablet 1   naloxone (NARCAN) nasal spray 4 mg/0.1 mL Use in case of accidental overdose with narcotics / opioids 1 kit 1   oxyCODONE-acetaminophen (PERCOCET) 10-325 MG tablet Take 1 tablet by mouth every 6 (six) hours as needed for pain. 120 tablet 0   [START ON 06/02/2023] oxyCODONE-acetaminophen (  PERCOCET) 10-325 MG tablet Take 1 tablet by mouth every 6 (six) hours as needed for pain. 120 tablet 0   [START ON 07/02/2023] oxyCODONE-acetaminophen (PERCOCET) 10-325 MG tablet Take 1 tablet by mouth every 6 (six) hours as needed for pain. 120 tablet 0   rosuvastatin (CRESTOR) 20 MG tablet Take 1 tablet (20 mg total) by mouth daily. 90 tablet 1   ticagrelor (BRILINTA) 90 MG TABS tablet Take 1 tablet (90 mg total) by mouth 2 (two) times daily. 60 tablet 2   No current facility-administered medications for this visit.    Allergies:   Nsaids    Social History:   reports that he has quit smoking. His smoking use included cigarettes. He has a 11.00 pack-year smoking history. He has never used smokeless tobacco. He reports that he does not drink alcohol and does not use drugs.   Family  History:  family history includes COPD in his maternal grandmother and mother; Kidney disease in his mother; Stroke (age of onset: 47) in his father; Thyroid disease in his mother.    ROS:     Review of Systems  Constitutional: Negative.   HENT: Negative.    Eyes: Negative.   Respiratory: Negative.    Gastrointestinal: Negative.   Genitourinary: Negative.   Musculoskeletal: Negative.   Skin: Negative.   Neurological: Negative.   Endo/Heme/Allergies: Negative.   Psychiatric/Behavioral: Negative.    All other systems reviewed and are negative.     All other systems are reviewed and negative.    PHYSICAL EXAM: VS:  BP (!) 160/74   Pulse 95   Ht 5\' 10"  (1.778 m)   Wt 245 lb 9.6 oz (111.4 kg)   SpO2 97%   BMI 35.24 kg/m  , BMI Body mass index is 35.24 kg/m. Last weight:  Wt Readings from Last 3 Encounters:  05/21/23 245 lb 9.6 oz (111.4 kg)  04/26/23 253 lb (114.8 kg)  04/17/23 253 lb 9.6 oz (115 kg)     Physical Exam Vitals reviewed.  Constitutional:      Appearance: Normal appearance. He is normal weight.  HENT:     Head: Normocephalic.     Nose: Nose normal.     Mouth/Throat:     Mouth: Mucous membranes are moist.  Eyes:     Pupils: Pupils are equal, round, and reactive to light.  Cardiovascular:     Rate and Rhythm: Normal rate and regular rhythm.     Pulses: Normal pulses.     Heart sounds: Normal heart sounds.  Pulmonary:     Effort: Pulmonary effort is normal.  Abdominal:     General: Abdomen is flat. Bowel sounds are normal.  Musculoskeletal:        General: Normal range of motion.     Cervical back: Normal range of motion.  Skin:    General: Skin is warm.  Neurological:     General: No focal deficit present.     Mental Status: He is alert.  Psychiatric:        Mood and Affect: Mood normal.       EKG:   Recent Labs: 04/12/2023: ALT 20 04/14/2023: BUN 56; Creatinine, Ser 3.77; Hemoglobin 9.1; Platelets 246; Potassium 5.3; Sodium 136     Lipid Panel    Component Value Date/Time   CHOL 187 04/13/2023 0708   CHOL 139 10/16/2017 0000   TRIG 445 (H) 04/13/2023 0708   HDL 22 (L) 04/13/2023 0708   HDL 23 (L) 10/16/2017 0000  CHOLHDL 8.5 04/13/2023 0708   VLDL UNABLE TO CALCULATE IF TRIGLYCERIDE OVER 400 mg/dL 16/08/9603 5409   LDLCALC UNABLE TO CALCULATE IF TRIGLYCERIDE OVER 400 mg/dL 81/19/1478 2956   LDLCALC  10/23/2018 1037     Comment:     . LDL cholesterol not calculated. Triglyceride levels greater than 400 mg/dL invalidate calculated LDL results. . Reference range: <100 . Desirable range <100 mg/dL for primary prevention;   <70 mg/dL for patients with CHD or diabetic patients  with > or = 2 CHD risk factors. Marland Kitchen LDL-C is now calculated using the Martin-Hopkins  calculation, which is a validated novel method providing  better accuracy than the Friedewald equation in the  estimation of LDL-C.  Horald Pollen et al. Lenox Ahr. 2130;865(78): 2061-2068  (http://education.[employer].com/faq/FAQ164)    LDLDIRECT 75 04/13/2023 0708      Other studies Reviewed: Additional studies/ records that were reviewed today include:  Review of the above records demonstrates:       No data to display            ASSESSMENT AND PLAN:    ICD-10-CM   1. Coronary artery disease involving native coronary artery of native heart without angina pectoris  I25.10 amLODipine (NORVASC) 10 MG tablet   Feeling better after MI    2. Hypertension secondary to other renal disorders  I15.1 amLODipine (NORVASC) 10 MG tablet   change amlodapine 10 mg as repeat BP 140/85    3. Nonrheumatic mitral valve regurgitation  I34.0 amLODipine (NORVASC) 10 MG tablet    4. STEMI involving right coronary artery (HCC)  I21.11 amLODipine (NORVASC) 10 MG tablet    5. CKD (chronic kidney disease) stage 4, GFR 15-29 ml/min (HCC)  N18.4 amLODipine (NORVASC) 10 MG tablet    6. Hypertriglyceridemia  E78.1 amLODipine (NORVASC) 10 MG tablet    7.  Morbid obesity (HCC)  E66.01 amLODipine (NORVASC) 10 MG tablet    8. Diastolic dysfunction  I51.89 amLODipine (NORVASC) 10 MG tablet       Problem List Items Addressed This Visit       Cardiovascular and Mediastinum   Hypertension   Relevant Medications   atorvastatin (LIPITOR) 80 MG tablet   amLODipine (NORVASC) 10 MG tablet   STEMI involving right coronary artery (HCC)   Relevant Medications   atorvastatin (LIPITOR) 80 MG tablet   amLODipine (NORVASC) 10 MG tablet   Coronary artery disease involving native coronary artery of native heart without angina pectoris - Primary   Relevant Medications   atorvastatin (LIPITOR) 80 MG tablet   amLODipine (NORVASC) 10 MG tablet   Nonrheumatic mitral valve regurgitation   Relevant Medications   atorvastatin (LIPITOR) 80 MG tablet   amLODipine (NORVASC) 10 MG tablet     Genitourinary   CKD (chronic kidney disease) stage 4, GFR 15-29 ml/min (HCC)   Relevant Medications   amLODipine (NORVASC) 10 MG tablet     Other   Hypertriglyceridemia   Relevant Medications   atorvastatin (LIPITOR) 80 MG tablet   amLODipine (NORVASC) 10 MG tablet   Morbid obesity (HCC)   Relevant Medications   amLODipine (NORVASC) 10 MG tablet   Diastolic dysfunction   Relevant Medications   amLODipine (NORVASC) 10 MG tablet       Disposition:   Return in about 6 weeks (around 07/02/2023).    Total time spent: 30 minutes  Signed,  Adrian Blackwater, MD  05/21/2023 11:27 AM    [facility]

## 2023-06-20 ENCOUNTER — Telehealth: Payer: Self-pay | Admitting: Family Medicine

## 2023-06-20 NOTE — Telephone Encounter (Signed)
Patient needs new Rx's written by SK. Please advise.  Irbesartan 150 mg Atorvastatin 80 mg Metoprolol tartrate 25 mg  [facility] [address]

## 2023-06-21 MED ORDER — ATORVASTATIN CALCIUM 80 MG PO TABS: 80.0000 mg | ORAL_TABLET | Freq: Every day | ORAL | 0 refills | Status: DC

## 2023-06-21 MED ORDER — IRBESARTAN 150 MG PO TABS: 150.0000 mg | ORAL_TABLET | Freq: Every day | ORAL | 0 refills | Status: DC

## 2023-06-21 MED ORDER — METOPROLOL TARTRATE 25 MG PO TABS: 25.0000 mg | ORAL_TABLET | Freq: Two times a day (BID) | ORAL | 0 refills | Status: DC

## 2023-07-02 ENCOUNTER — Ambulatory Visit: Payer: Medicare HMO | Admitting: Cardiovascular Disease

## 2023-07-06 ENCOUNTER — Ambulatory Visit (INDEPENDENT_AMBULATORY_CARE_PROVIDER_SITE_OTHER): Payer: Medicare HMO | Admitting: Cardiovascular Disease

## 2023-07-06 ENCOUNTER — Encounter: Payer: Self-pay | Admitting: Cardiovascular Disease

## 2023-07-06 VITALS — BP 120/68 | HR 90 | Ht 70.0 in | Wt 244.8 lb

## 2023-07-06 DIAGNOSIS — E781 Pure hyperglyceridemia: Secondary | ICD-10-CM | POA: Diagnosis not present

## 2023-07-06 DIAGNOSIS — I2510 Atherosclerotic heart disease of native coronary artery without angina pectoris: Secondary | ICD-10-CM | POA: Diagnosis not present

## 2023-07-06 DIAGNOSIS — I151 Hypertension secondary to other renal disorders: Secondary | ICD-10-CM | POA: Diagnosis not present

## 2023-07-06 DIAGNOSIS — I340 Nonrheumatic mitral (valve) insufficiency: Secondary | ICD-10-CM

## 2023-07-06 NOTE — Progress Notes (Signed)
Cardiology Office Note   Date:  07/06/2023   ID:  Nathan Ramos, DOB [birth year], MRN [id number]  PCP:  [facility], [place]  Cardiologist:  Adrian Blackwater, MD      History of Present Illness: Nathan Ramos is a 52 y.o. male who presents for  Chief Complaint  Patient presents with   Follow-up    Feet hurt      Past Medical History:  Diagnosis Date   Anemia    of chronic renal disease   Bone spur of other site    right shoulder, managed by ortho   Chronic pain    Nathan Ramos CFNP at [facility]   CKD (chronic kidney disease)    stage III 03/2014 (Dr. 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 Dr Mosetta Pigeon   Primary localized osteoarthritis of right knee    Smoker      Past Surgical History:  Procedure Laterality Date   CARPAL TUNNEL RELEASE Left    CATARACT EXTRACTION W/ INTRAOCULAR LENS IMPLANT Left 2008   CATARACT EXTRACTION W/ INTRAOCULAR LENS IMPLANT Right 2008   CORONARY/GRAFT ACUTE MI REVASCULARIZATION N/A 04/12/2023   Procedure: Coronary/Graft Acute MI Revascularization;  Surgeon: [name], MD;  Location: [facility] INVASIVE CV LAB;  Service: Cardiovascular;  Laterality: N/A;   EYE SURGERY     HERNIA REPAIR     JOINT REPLACEMENT Left 03/2014   LEFT HEART CATH AND CORONARY ANGIOGRAPHY N/A 04/12/2023   Procedure: LEFT HEART CATH AND CORONARY ANGIOGRAPHY;  Surgeon: [name], MD;  Location: [facility] INVASIVE CV LAB;  Service: Cardiovascular;  Laterality: N/A;   TOTAL KNEE ARTHROPLASTY Left 03/30/2014   Procedure: TOTAL KNEE ARTHROPLASTY;  Surgeon: Nilda Simmer, MD;  Location: [facility] OR;  Service: Orthopedics;  Laterality: Left;   TOTAL KNEE  ARTHROPLASTY Right 10/26/2014   Procedure: TOTAL KNEE ARTHROPLASTY;  Surgeon: Nilda Simmer, MD;  Location: [facility] OR;  Service: Orthopedics;  Laterality: Right;   UMBILICAL HERNIA REPAIR  2010     Current Outpatient Medications  Medication Sig Dispense Refill   amLODipine (NORVASC) 10 MG tablet Take 1 tablet (10 mg total) by mouth daily. 30 tablet 11   aspirin 81 MG chewable tablet Chew 1 tablet (81 mg total) by mouth daily. 30 tablet 11   atorvastatin (LIPITOR) 80 MG tablet Take 1 tablet (80 mg total) by mouth daily. 30 tablet 0   Febuxostat 80 MG TABS Take 1 tablet by mouth every other day.      irbesartan (AVAPRO) 150 MG tablet Take 1 tablet (150 mg total) by mouth daily. 30 tablet 0   metoprolol tartrate (LOPRESSOR) 25 MG tablet Take 1 tablet (25 mg total) by mouth 2 (two) times daily. 60 tablet 0   naloxone (NARCAN) nasal spray 4 mg/0.1 mL Use in case of accidental overdose with narcotics / opioids 1 kit 1   oxyCODONE-acetaminophen (PERCOCET) 10-325 MG tablet Take 1 tablet by mouth every 6 (six) hours as needed for pain. 120 tablet 0   ticagrelor (BRILINTA) 90 MG TABS tablet Take 1 tablet (90 mg  total) by mouth 2 (two) times daily. 60 tablet 2   No current facility-administered medications for this visit.    Allergies:   Nsaids    Social History:   reports that he has quit smoking. His smoking use included cigarettes. He has a 11 pack-year smoking history. He has never used smokeless tobacco. He reports that he does not drink alcohol and does not use drugs.   Family History:  family history includes COPD in his maternal grandmother and mother; Kidney disease in his mother; Stroke (age of onset: 1) in his father; Thyroid disease in his mother.    ROS:     Review of Systems  Constitutional: Negative.   HENT: Negative.    Eyes: Negative.   Respiratory: Negative.    Gastrointestinal: Negative.   Genitourinary: Negative.   Musculoskeletal: Negative.   Skin: Negative.    Neurological: Negative.   Endo/Heme/Allergies: Negative.   Psychiatric/Behavioral: Negative.    All other systems reviewed and are negative.     All other systems are reviewed and negative.    PHYSICAL EXAM: VS:  BP 120/68   Pulse 90   Ht 5\' 10"  (1.778 m)   Wt 244 lb 12.8 oz (111 kg)   SpO2 97%   BMI 35.13 kg/m  , BMI Body mass index is 35.13 kg/m. Last weight:  Wt Readings from Last 3 Encounters:  07/06/23 244 lb 12.8 oz (111 kg)  05/21/23 245 lb 9.6 oz (111.4 kg)  04/26/23 253 lb (114.8 kg)     Physical Exam Vitals reviewed.  Constitutional:      Appearance: Normal appearance. He is normal weight.  HENT:     Head: Normocephalic.     Nose: Nose normal.     Mouth/Throat:     Mouth: Mucous membranes are moist.  Eyes:     Pupils: Pupils are equal, round, and reactive to light.  Cardiovascular:     Rate and Rhythm: Normal rate and regular rhythm.     Pulses: Normal pulses.     Heart sounds: Normal heart sounds.  Pulmonary:     Effort: Pulmonary effort is normal.  Abdominal:     General: Abdomen is flat. Bowel sounds are normal.  Musculoskeletal:        General: Normal range of motion.     Cervical back: Normal range of motion.  Skin:    General: Skin is warm.  Neurological:     General: No focal deficit present.     Mental Status: He is alert.  Psychiatric:        Mood and Affect: Mood normal.       EKG:   Recent Labs: 04/12/2023: ALT 20 04/14/2023: BUN 56; Creatinine, Ser 3.77; Hemoglobin 9.1; Platelets 246; Potassium 5.3; Sodium 136    Lipid Panel    Component Value Date/Time   CHOL 187 04/13/2023 0708   CHOL 139 10/16/2017 0000   TRIG 445 (H) 04/13/2023 0708   HDL 22 (L) 04/13/2023 0708   HDL 23 (L) 10/16/2017 0000   CHOLHDL 8.5 04/13/2023 0708   VLDL UNABLE TO CALCULATE IF TRIGLYCERIDE OVER 400 mg/dL 10/93/2355 7322   LDLCALC UNABLE TO CALCULATE IF TRIGLYCERIDE OVER 400 mg/dL 02/54/2706 2376   LDLCALC  10/23/2018 1037     Comment:      . LDL cholesterol not calculated. Triglyceride levels greater than 400 mg/dL invalidate calculated LDL results. . Reference range: <100 . Desirable range <100 mg/dL for primary prevention;   <70 mg/dL for patients  with CHD or diabetic patients  with > or = 2 CHD risk factors. Marland Kitchen LDL-C is now calculated using the Martin-Hopkins  calculation, which is a validated novel method providing  better accuracy than the Friedewald equation in the  estimation of LDL-C.  Horald Pollen et al. Lenox Ahr. 0102;725(36): 2061-2068  (http://education.[employer].com/faq/FAQ164)    LDLDIRECT 75 04/13/2023 0708      Other studies Reviewed: Additional studies/ records that were reviewed today include:  Review of the above records demonstrates:       No data to display            ASSESSMENT AND PLAN:    ICD-10-CM   1. Coronary artery disease involving native coronary artery of native heart without angina pectoris  I25.10    stable, foot pain due walking on concrete as circulation, DP/PT 3 plus    2. Hypertension secondary to other renal disorders  I15.1     3. Nonrheumatic mitral valve regurgitation  I34.0     4. Hypertriglyceridemia  E78.1     5. Morbid obesity (HCC)  E66.01        Problem List Items Addressed This Visit       Cardiovascular and Mediastinum   Hypertension   Coronary artery disease involving native coronary artery of native heart without angina pectoris - Primary   Nonrheumatic mitral valve regurgitation     Other   Hypertriglyceridemia   Morbid obesity (HCC)       Disposition:   Return in about 3 months (around 10/06/2023).    Total time spent: 35 minutes  Signed,  Adrian Blackwater, MD  07/06/2023 11:18 AM    [facility]

## 2023-07-13 ENCOUNTER — Other Ambulatory Visit: Payer: Self-pay | Admitting: Cardiovascular Disease

## 2023-07-19 ENCOUNTER — Encounter: Payer: Self-pay | Admitting: Student in an Organized Health Care Education/Training Program

## 2023-07-19 ENCOUNTER — Ambulatory Visit
Payer: Medicare HMO | Attending: Student in an Organized Health Care Education/Training Program | Admitting: Student in an Organized Health Care Education/Training Program

## 2023-07-19 VITALS — BP 152/87 | HR 92 | Temp 97.2°F | Ht 69.0 in | Wt 243.0 lb

## 2023-07-19 DIAGNOSIS — N184 Chronic kidney disease, stage 4 (severe): Secondary | ICD-10-CM | POA: Diagnosis not present

## 2023-07-19 DIAGNOSIS — Z96653 Presence of artificial knee joint, bilateral: Secondary | ICD-10-CM | POA: Diagnosis not present

## 2023-07-19 DIAGNOSIS — M1A00X1 Idiopathic chronic gout, unspecified site, with tophus (tophi): Secondary | ICD-10-CM | POA: Diagnosis not present

## 2023-07-19 DIAGNOSIS — M1A39X1 Chronic gout due to renal impairment, multiple sites, with tophus (tophi): Secondary | ICD-10-CM | POA: Diagnosis not present

## 2023-07-19 DIAGNOSIS — Q615 Medullary cystic kidney: Secondary | ICD-10-CM | POA: Diagnosis not present

## 2023-07-19 DIAGNOSIS — G894 Chronic pain syndrome: Secondary | ICD-10-CM

## 2023-07-19 DIAGNOSIS — G5603 Carpal tunnel syndrome, bilateral upper limbs: Secondary | ICD-10-CM | POA: Diagnosis not present

## 2023-07-19 DIAGNOSIS — E875 Hyperkalemia: Secondary | ICD-10-CM | POA: Diagnosis not present

## 2023-07-19 DIAGNOSIS — I129 Hypertensive chronic kidney disease with stage 1 through stage 4 chronic kidney disease, or unspecified chronic kidney disease: Secondary | ICD-10-CM | POA: Diagnosis not present

## 2023-07-19 MED ORDER — OXYCODONE-ACETAMINOPHEN 10-325 MG PO TABS: 1.0000 | ORAL_TABLET | Freq: Four times a day (QID) | ORAL | 0 refills | Status: AC | PRN

## 2023-07-19 MED ORDER — OXYCODONE-ACETAMINOPHEN 10-325 MG PO TABS: 1.0000 | ORAL_TABLET | Freq: Four times a day (QID) | ORAL | 0 refills | Status: DC | PRN

## 2023-07-19 NOTE — Progress Notes (Signed)
PROVIDER NOTE: Information contained herein reflects review and annotations entered in association with encounter. Interpretation of such information and data should be left to medically-trained personnel. Information provided to patient can be located elsewhere in the medical record under "Patient Instructions". Document created using STT-dictation technology, any transcriptional errors that may result from process are unintentional.    Patient: Nathan Ramos  Service Category: E/M  Provider: [name], MD  DOB: August 17, 1971  DOS: 07/19/2023  Specialty: Interventional Pain Management  MRN: [id number]  Setting: Ambulatory outpatient  PCP: No primary care provider on file.  Type: Established Patient    Referring Provider: [facility]*  Location: Office  Delivery: Face-to-face     HPI  [name], a 52 y.o. year old male, is here today because of his Chronic pain syndrome [G89.4]. [name] primary complain today is Generalized Body Aches  Last encounter: My last encounter with him was on 04/26/23  Pertinent problems: [name] has Medullary cystic disease of the kidney; Chronic pain; Gout due to renal impairment; Primary localized osteoarthritis of right knee; Chronic pain syndrome; and Opioid use on their pertinent problem list. Pain Assessment: Severity of Chronic pain is reported as a 4 /10. Location: Generalized  / . Onset: More than a month ago. Quality: Burning, Constant, Stabbing, Throbbing. Timing: Constant. Modifying factor(s): rest, meds. Vitals:  height is 5\' 9"  (1.753 m) and weight is 243 lb (110.2 kg). His temperature is 97.2 F (36.2 C) (abnormal). His blood pressure is 152/87 (abnormal) and his pulse is 92. His oxygen saturation is 97%.   Reason for encounter: medication management.    No change in medical history since last visit.  Patient's pain is at baseline.  Patient continues multimodal pain regimen as prescribed.  States that it provides pain relief and  improvement in functional status.   Pharmacotherapy Assessment  Analgesic: Percocet 10 mg every 6 hours daily as needed, quantity 120/month, MME 60,    Monitoring: Nathan Ramos PMP: PDMP reviewed during this encounter.       Pharmacotherapy: No side-effects or adverse reactions reported. Compliance: No problems identified. Effectiveness: Clinically acceptable.  UDS:  Summary  Date Value Ref Range Status  01/25/2023 Note  Final    Comment:    ==================================================================== [employer] [facility] (MW) ==================================================================== Test                             Result       Flag       Units  Drug Present and Declared for Prescription Verification   Oxycodone                      1467         EXPECTED   ng/mg creat   Oxymorphone                    2807         EXPECTED   ng/mg creat   Noroxycodone                   1041         EXPECTED   ng/mg creat   Noroxymorphone                 468          EXPECTED   ng/mg creat    Sources of oxycodone are scheduled prescription medications.  Oxymorphone, noroxycodone, and noroxymorphone are expected    metabolites of oxycodone. Oxymorphone is also available as a    scheduled prescription medication.  ==================================================================== Test                      Result    Flag   Units      Ref Range   Creatinine              92               mg/dL      >=78 ==================================================================== Declared Medications:  The flagging and interpretation on this report are based on the  following declared medications.  Unexpected results may arise from  inaccuracies in the declared medications.   **Note: The testing scope of this panel includes these medications:   Oxycodone (Percocet)   **Note: The testing scope of this panel does not include the  following reported medications:   Acetaminophen (Percocet)   Amlodipine (Norvasc)  Atorvastatin (Lipitor)  Colchicine  Febuxostat  Furosemide (Lasix)  Ibuprofen (Advil)  Losartan (Cozaar)  Naloxone (Narcan) ==================================================================== For clinical consultation, please call [phone]. ====================================================================       ROS  Constitutional: Denies any fever or chills Gastrointestinal: No reported hemesis, hematochezia, vomiting, or acute GI distress Musculoskeletal: Diffuse arthralgias and myalgias Neurological: No reported episodes of acute onset apraxia, aphasia, dysarthria, agnosia, amnesia, paralysis, loss of coordination, or loss of consciousness  Medication Review  Febuxostat, amLODipine, aspirin, atorvastatin, irbesartan, metoprolol tartrate, naloxone, oxyCODONE-acetaminophen, and ticagrelor  History Review  Allergy: [name] is allergic to nsaids. Drug: [name]  reports no history of drug use. Alcohol:  reports no history of alcohol use. Tobacco:  reports that he has quit smoking. His smoking use included cigarettes. He has a 11 pack-year smoking history. He has never used smokeless tobacco. Social: Mr. [name]  reports that he has quit smoking. His smoking use included cigarettes. He has a 11 pack-year smoking history. He has never used smokeless tobacco. He reports that he does not drink alcohol and does not use drugs. Medical:  has a past medical history of Anemia, Bone spur of other site, Chronic pain, CKD (chronic kidney disease), Controlled substance agreement signed, Gout, Gouty arthritis, History of YAG laser capsulotomy of lens of left eye, Hyperlipidemia, Hypertension, IFG (impaired fasting glucose), Left knee DJD, Medullary cystic disease of the kidney, Primary localized osteoarthritis of right knee, and Smoker. Surgical: [name]  has a past surgical history that includes Carpal tunnel release (Left); Umbilical hernia repair (2010); Cataract  extraction w/ intraocular lens implant (Left, 2008); Cataract extraction w/ intraocular lens implant (Right, 2008); Hernia repair; Total knee arthroplasty (Left, 03/30/2014); Eye surgery; Total knee arthroplasty (Right, 10/26/2014); Joint replacement (Left, 03/2014); Coronary/Graft Acute MI Revascularization (N/A, 04/12/2023); and LEFT HEART CATH AND CORONARY ANGIOGRAPHY (N/A, 04/12/2023). Family: family history includes COPD in his maternal grandmother and mother; Kidney disease in his mother; Stroke (age of onset: 82) in his father; Thyroid disease in his mother.  Laboratory Chemistry Profile   Renal Lab Results  Component Value Date   BUN 56 (H) 04/14/2023   CREATININE 3.77 (H) 04/14/2023   BCR 15 06/01/2020   GFRAA 27 (L) 06/01/2020   GFRNONAA 19 (L) 04/14/2023     Hepatic Lab Results  Component Value Date   AST 70 (H) 04/12/2023   ALT 20 04/12/2023   ALBUMIN 4.1 04/12/2023   ALKPHOS 122 04/12/2023     Electrolytes Lab Results  Component Value Date   NA 136 04/14/2023   K 5.3 (H) 04/14/2023   CL 109 04/14/2023   CALCIUM 9.0 04/14/2023     Bone No results found for: "VD25OH", "VD125OH2TOT", "DG6440HK7", "QQ5956LO7", "25OHVITD1", "25OHVITD2", "25OHVITD3", "TESTOFREE", "TESTOSTERONE"   Inflammation (CRP: Acute Phase) (ESR: Chronic Phase) No results found for: "CRP", "ESRSEDRATE", "LATICACIDVEN"     Note: Above Lab results reviewed.  Recent Imaging Review  CARDIAC CATHETERIZATION   RPAV lesion is 100% stenosed.   Mid LAD lesion is 50% stenosed.   2nd Diag lesion is 50% stenosed.   A stent was successfully placed.   Post intervention, there is a 0% residual stenosis.   There is mild left ventricular systolic dysfunction.   LV end diastolic pressure is normal.   The left ventricular ejection fraction is 45-50% by visual estimate.   There is no mitral valve regurgitation.  Conclusion  STEMI presentation inferior wall Left ventricular function mildly depressed with  inferior hypokinesis  ejection fraction around 45 to 50%  Coronaries Left main large minor irregularities LAD large 50% mid LAD bifurcation with diagonal 50% Circumflex large minor irregularities RCA large 100% occluded PL TIMI 0 flow IRA  Intervention Successful PCI and stent of the PL 3.0 x 15 mm frontier Onyx to 14 atm  lesion reduced from 100 down to 0% TIMI-3 flow restored from TIMI 0  No complications Patient tolerated procedure well Recommend aspirin Brilinta for 12 months Patient transferred to ICU postprocedure  Note: Reviewed        Physical Exam  General appearance: alert, cooperative, and in mild distress presents today in wheelchair Mental status: Alert, oriented x 3 (person, place, & time)       Respiratory: No evidence of acute respiratory distress Eyes: PERLA Vitals: BP (!) 152/87   Pulse 92   Temp (!) 97.2 F (36.2 C)   Ht 5\' 9"  (1.753 m)   Wt 243 lb (110.2 kg)   SpO2 97%   BMI 35.88 kg/m  BMI: Estimated body mass index is 35.88 kg/m as calculated from the following:   Height as of this encounter: 5\' 9"  (1.753 m).   Weight as of this encounter: 243 lb (110.2 kg). Ideal: Ideal body weight: 70.7 kg (155 lb 13.8 oz) Adjusted ideal body weight: 86.5 kg (190 lb 11.5 oz)  Cervicalgia Bilatral knee pain   Assessment   Status Diagnosis  Controlled Controlled Controlled 1. Chronic pain syndrome   2. History of bilateral knee replacement   3. Bilateral carpal tunnel syndrome   4. 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 has a current medication list which includes the following long-term medication(s): amlodipine, atorvastatin, irbesartan, and metoprolol tartrate.   Pharmacotherapy (Medications Ordered): Meds ordered this encounter  Medications   oxyCODONE-acetaminophen (PERCOCET) 10-325 MG tablet    Sig: Take 1 tablet by mouth every 6 (six) hours as needed for pain.     Dispense:  120 tablet    Refill:  0   oxyCODONE-acetaminophen (PERCOCET) 10-325 MG tablet    Sig: Take 1 tablet by mouth every 6 (six) hours as needed for pain.    Dispense:  120 tablet    Refill:  0   oxyCODONE-acetaminophen (PERCOCET) 10-325 MG tablet    Sig: Take 1 tablet by mouth every 6 (six) hours as needed for pain.    Dispense:  120  tablet    Refill:  0    Follow-up plan:   Return in about 15 weeks (around 11/01/2023) for MM, F2F.   Recent Visits Date Type Provider Dept  04/26/23 Office Visit Nathan Jolly, MD [facility]  Showing recent visits within past 90 days and meeting all other requirements Today's Visits Date Type Provider Dept  07/19/23 Office Visit Nathan Jolly, MD [facility]  Showing today's visits and meeting all other requirements Future Appointments No visits were found meeting these conditions. Showing future appointments within next 90 days and meeting all other requirements  I discussed the assessment and treatment plan with the patient. The patient was provided an opportunity to ask questions and all were answered. The patient agreed with the plan and demonstrated an understanding of the instructions.  Patient advised to call back or seek an in-person evaluation if the symptoms or condition worsens.  Duration of encounter: 30 minutes.  Note by: Nathan Jolly, MD Date: 07/19/2023; Time: 11:24 AM

## 2023-07-19 NOTE — Progress Notes (Signed)
Safety precautions to be maintained throughout the outpatient stay will include: orient to surroundings, keep bed in low position, maintain call bell within reach at all times, provide assistance with transfer out of bed and ambulation.   Nursing Pain Medication Assessment:  Safety precautions to be maintained throughout the outpatient stay will include: orient to surroundings, keep bed in low position, maintain call bell within reach at all times, provide assistance with transfer out of bed and ambulation.  Medication Inspection Compliance: Pill count conducted under aseptic conditions, in front of the patient. Neither the pills nor the bottle was removed from the patient's sight at any time. Once count was completed pills were immediately returned to the patient in their original bottle.  Medication: Oxycodone/APAP Pill/Patch Count:  63 of 120 pills remain Pill/Patch Appearance: Markings consistent with prescribed medication Bottle Appearance: Standard pharmacy container. Clearly labeled. Filled Date: 8 / 22 / 2024 Last Medication intake:  Today

## 2023-08-02 DIAGNOSIS — N184 Chronic kidney disease, stage 4 (severe): Secondary | ICD-10-CM | POA: Diagnosis not present

## 2023-08-02 DIAGNOSIS — Q615 Medullary cystic kidney: Secondary | ICD-10-CM | POA: Diagnosis not present

## 2023-08-02 DIAGNOSIS — M1A00X1 Idiopathic chronic gout, unspecified site, with tophus (tophi): Secondary | ICD-10-CM | POA: Diagnosis not present

## 2023-08-02 DIAGNOSIS — I129 Hypertensive chronic kidney disease with stage 1 through stage 4 chronic kidney disease, or unspecified chronic kidney disease: Secondary | ICD-10-CM | POA: Diagnosis not present

## 2023-08-02 DIAGNOSIS — E875 Hyperkalemia: Secondary | ICD-10-CM | POA: Diagnosis not present

## 2023-10-05 ENCOUNTER — Ambulatory Visit: Payer: Medicare HMO | Admitting: Cardiovascular Disease

## 2023-10-30 ENCOUNTER — Ambulatory Visit
Payer: Medicare HMO | Attending: Student in an Organized Health Care Education/Training Program | Admitting: Student in an Organized Health Care Education/Training Program

## 2023-10-30 ENCOUNTER — Encounter: Payer: Self-pay | Admitting: Student in an Organized Health Care Education/Training Program

## 2023-10-30 VITALS — BP 156/81 | HR 79 | Temp 98.2°F | Ht 69.0 in | Wt 245.0 lb

## 2023-10-30 DIAGNOSIS — G5603 Carpal tunnel syndrome, bilateral upper limbs: Secondary | ICD-10-CM | POA: Insufficient documentation

## 2023-10-30 DIAGNOSIS — M1A39X1 Chronic gout due to renal impairment, multiple sites, with tophus (tophi): Secondary | ICD-10-CM | POA: Insufficient documentation

## 2023-10-30 DIAGNOSIS — Z96653 Presence of artificial knee joint, bilateral: Secondary | ICD-10-CM | POA: Insufficient documentation

## 2023-10-30 DIAGNOSIS — M79641 Pain in right hand: Secondary | ICD-10-CM | POA: Diagnosis not present

## 2023-10-30 DIAGNOSIS — Z79899 Other long term (current) drug therapy: Secondary | ICD-10-CM | POA: Insufficient documentation

## 2023-10-30 DIAGNOSIS — G894 Chronic pain syndrome: Secondary | ICD-10-CM | POA: Insufficient documentation

## 2023-10-30 MED ORDER — OXYCODONE-ACETAMINOPHEN 10-325 MG PO TABS: 1.0000 | ORAL_TABLET | Freq: Four times a day (QID) | ORAL | 0 refills | Status: DC | PRN

## 2023-10-30 MED ORDER — OXYCODONE-ACETAMINOPHEN 10-325 MG PO TABS: 1.0000 | ORAL_TABLET | Freq: Four times a day (QID) | ORAL | 0 refills | Status: AC | PRN

## 2023-10-30 NOTE — Progress Notes (Signed)
Safety precautions to be maintained throughout the outpatient stay will include: orient to surroundings, keep bed in low position, maintain call bell within reach at all times, provide assistance with transfer out of bed and ambulation.   Nursing Pain Medication Assessment:  Safety precautions to be maintained throughout the outpatient stay will include: orient to surroundings, keep bed in low position, maintain call bell within reach at all times, provide assistance with transfer out of bed and ambulation.  Medication Inspection Compliance: Pill count conducted under aseptic conditions, in front of the patient. Neither the pills nor the bottle was removed from the patient's sight at any time. Once count was completed pills were immediately returned to the patient in their original bottle.  Medication: Hydrocodone/APAP Pill/Patch Count:  11 of 120 pills remain Pill/Patch Appearance: Markings consistent with prescribed medication Bottle Appearance: Standard pharmacy container. Clearly labeled. Filled Date: 52 / 19 / 2024 Last Medication intake:  Today

## 2023-10-30 NOTE — Progress Notes (Signed)
PROVIDER NOTE: Information contained herein reflects review and annotations entered in association with encounter. Interpretation of such information and data should be left to medically-trained personnel. Information provided to patient can be located elsewhere in the medical record under "Patient Instructions". Document created using STT-dictation technology, any transcriptional errors that may result from process are unintentional.    Patient: Nathan Ramos  Service Category: E/M  Provider: [name], MD  DOB: [birth year]  DOS: 10/30/2023  Specialty: Interventional Pain Management  MRN: [id number]  Setting: Ambulatory outpatient  PCP: [facility]  Type: Established Patient    Referring Provider: No ref. provider found  Location: Office  Delivery: Face-to-face     HPI  [name], a 52 y.o. year old male, is here today because of his Chronic pain syndrome [G89.4]. [name] primary complain today is Generalized Body Aches  Last encounter: My last encounter with him was on 07/19/23  Pertinent problems: [name] has Medullary cystic disease of the kidney; Chronic pain; Gout due to renal impairment; Primary localized osteoarthritis of right knee; Chronic pain syndrome; and Opioid use on their pertinent problem list. Pain Assessment: Severity of Chronic pain is reported as a 8 /10. Location: Generalized  /denies. Onset: More than a month ago. Quality: Lambert Mody, Aching. Timing: Constant. Modifying factor(s): meds, sleep, laying down. Vitals:  height is 5\' 9"  (1.753 m) and weight is 245 lb (111.1 kg). His temperature is 98.2 F (36.8 C). His blood pressure is 156/81 (abnormal) and his pulse is 79. His oxygen saturation is 99%.   Reason for encounter: medication management.    No change in medical history since last visit.  Patient's pain is at baseline.  Patient continues multimodal pain regimen as prescribed.  States that it provides pain relief and improvement in  functional status.   Pharmacotherapy Assessment  Analgesic: Percocet 10 mg every 6 hours daily as needed, quantity 120/month, MME 60,    Monitoring: [place] PMP: PDMP reviewed during this encounter.       Pharmacotherapy: No side-effects or adverse reactions reported. Compliance: No problems identified. Effectiveness: Clinically acceptable.  UDS:  Summary  Date Value Ref Range Status  01/25/2023 Note  Final    Comment:    ==================================================================== [employer] Select 13 (MW) ==================================================================== Test                             Result       Flag       Units  Drug Present and Declared for Prescription Verification   Oxycodone                      1467         EXPECTED   ng/mg creat   Oxymorphone                    2807         EXPECTED   ng/mg creat   Noroxycodone                   1041         EXPECTED   ng/mg creat   Noroxymorphone                 468          EXPECTED   ng/mg creat    Sources of oxycodone are scheduled prescription medications.    Oxymorphone,  noroxycodone, and noroxymorphone are expected    metabolites of oxycodone. Oxymorphone is also available as a    scheduled prescription medication.  ==================================================================== Test                      Result    Flag   Units      Ref Range   Creatinine              92               mg/dL      >=40 ==================================================================== Declared Medications:  The flagging and interpretation on this report are based on the  following declared medications.  Unexpected results may arise from  inaccuracies in the declared medications.   **Note: The testing scope of this panel includes these medications:   Oxycodone (Percocet)   **Note: The testing scope of this panel does not include the  following reported medications:   Acetaminophen (Percocet)  Amlodipine  (Norvasc)  Atorvastatin (Lipitor)  Colchicine  Febuxostat  Furosemide (Lasix)  Ibuprofen (Advil)  Losartan (Cozaar)  Naloxone (Narcan) ==================================================================== For clinical consultation, please call [phone]. ====================================================================       ROS  Constitutional: Denies any fever or chills Gastrointestinal: No reported hemesis, hematochezia, vomiting, or acute GI distress Musculoskeletal: Diffuse arthralgias and myalgias Neurological: No reported episodes of acute onset apraxia, aphasia, dysarthria, agnosia, amnesia, paralysis, loss of coordination, or loss of consciousness  Medication Review  Febuxostat, amLODipine, aspirin, irbesartan, metoprolol tartrate, naloxone, oxyCODONE-acetaminophen, and rosuvastatin  History Review  Allergy: [name] is allergic to nsaids. Drug: [name]  reports no history of drug use. Alcohol:  reports no history of alcohol use. Tobacco:  reports that he has quit smoking. His smoking use included cigarettes. He has a 11 pack-year smoking history. He has never used smokeless tobacco. Social: [name]  reports that he has quit smoking. His smoking use included cigarettes. He has a 11 pack-year smoking history. He has never used smokeless tobacco. He reports that he does not drink alcohol and does not use drugs. Medical:  has a past medical history of Anemia, Bone spur of other site, Chronic pain, CKD (chronic kidney disease), Controlled substance agreement signed, Gout, Gouty arthritis, History of YAG laser capsulotomy of lens of left eye, Hyperlipidemia, Hypertension, IFG (impaired fasting glucose), Left knee DJD, Medullary cystic disease of the kidney, Primary localized osteoarthritis of right knee, and Smoker. Surgical: [name]  has a past surgical history that includes Carpal tunnel release (Left); Umbilical hernia repair (2010); Cataract extraction w/  intraocular lens implant (Left, 2008); Cataract extraction w/ intraocular lens implant (Right, 2008); Hernia repair; Total knee arthroplasty (Left, 03/30/2014); Eye surgery; Total knee arthroplasty (Right, 10/26/2014); Joint replacement (Left, 03/2014); Coronary/Graft Acute MI Revascularization (N/A, 04/12/2023); and LEFT HEART CATH AND CORONARY ANGIOGRAPHY (N/A, 04/12/2023). Family: family history includes COPD in his maternal grandmother and mother; Kidney disease in his mother; Stroke (age of onset: 51) in his father; Thyroid disease in his mother.  Laboratory Chemistry Profile   Renal Lab Results  Component Value Date   BUN 56 (H) 04/14/2023   CREATININE 3.77 (H) 04/14/2023   BCR 15 06/01/2020   GFRAA 27 (L) 06/01/2020   GFRNONAA 19 (L) 04/14/2023     Hepatic Lab Results  Component Value Date   AST 70 (H) 04/12/2023   ALT 20 04/12/2023   ALBUMIN 4.1 04/12/2023   ALKPHOS 122 04/12/2023     Electrolytes Lab Results  Component  Value Date   NA 136 04/14/2023   K 5.3 (H) 04/14/2023   CL 109 04/14/2023   CALCIUM 9.0 04/14/2023     Bone No results found for: "VD25OH", "VD125OH2TOT", "[id number]", "XB2841LK4", "25OHVITD1", "25OHVITD2", "25OHVITD3", "TESTOFREE", "TESTOSTERONE"   Inflammation (CRP: Acute Phase) (ESR: Chronic Phase) No results found for: "CRP", "ESRSEDRATE", "LATICACIDVEN"     Note: Above Lab results reviewed.  Recent Imaging Review  CARDIAC CATHETERIZATION   RPAV lesion is 100% stenosed.   Mid LAD lesion is 50% stenosed.   2nd Diag lesion is 50% stenosed.   A stent was successfully placed.   Post intervention, there is a 0% residual stenosis.   There is mild left ventricular systolic dysfunction.   LV end diastolic pressure is normal.   The left ventricular ejection fraction is 45-50% by visual estimate.   There is no mitral valve regurgitation.  Conclusion  STEMI presentation inferior wall Left ventricular function mildly depressed with inferior  hypokinesis  ejection fraction around 45 to 50%  Coronaries Left main large minor irregularities LAD large 50% mid LAD bifurcation with diagonal 50% Circumflex large minor irregularities RCA large 100% occluded PL TIMI 0 flow IRA  Intervention Successful PCI and stent of the PL 3.0 x 15 mm frontier Onyx to 14 atm  lesion reduced from 100 down to 0% TIMI-3 flow restored from TIMI 0  No complications Patient tolerated procedure well Recommend aspirin Brilinta for 12 months Patient transferred to ICU postprocedure  Note: Reviewed        Physical Exam  General appearance: alert, cooperative, and in mild distress presents today in wheelchair Mental status: Alert, oriented x 3 (person, place, & time)       Respiratory: No evidence of acute respiratory distress Eyes: PERLA Vitals: BP (!) 156/81   Pulse 79   Temp 98.2 F (36.8 C)   Ht 5\' 9"  (1.753 m)   Wt 245 lb (111.1 kg)   SpO2 99%   BMI 36.18 kg/m  BMI: Estimated body mass index is 36.18 kg/m as calculated from the following:   Height as of this encounter: 5\' 9"  (1.753 m).   Weight as of this encounter: 245 lb (111.1 kg). Ideal: Ideal body weight: 70.7 kg (155 lb 13.8 oz) Adjusted ideal body weight: 86.9 kg (191 lb 8.3 oz)  Cervicalgia Bilatral knee pain   Assessment   Status Diagnosis  Controlled Controlled Controlled 1. Chronic pain syndrome   2. History of bilateral knee replacement   3. Bilateral carpal tunnel syndrome   4. 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 Right hand pain   6. Controlled substance agreement signed             Plan of Care   [name] has a current medication list which includes the following long-term medication(s): amlodipine, irbesartan, and metoprolol tartrate.   Pharmacotherapy (Medications Ordered): Meds ordered this encounter  Medications   oxyCODONE-acetaminophen (PERCOCET) 10-325 MG tablet    Sig: Take 1 tablet by mouth  every 6 (six) hours as needed for pain.    Dispense:  120 tablet    Refill:  0   oxyCODONE-acetaminophen (PERCOCET) 10-325 MG tablet    Sig: Take 1 tablet by mouth every 6 (six) hours as needed for pain.    Dispense:  120 tablet    Refill:  0   oxyCODONE-acetaminophen (PERCOCET) 10-325 MG tablet    Sig: Take 1 tablet by mouth every 6 (six)  hours as needed for pain.    Dispense:  120 tablet    Refill:  0    Follow-up plan:   Return in about 3 months (around 01/28/2024) for MM, F2F.   Recent Visits No visits were found meeting these conditions. Showing recent visits within past 90 days and meeting all other requirements Today's Visits Date Type Provider Dept  10/30/23 Office Visit [name], MD [facility]  Showing today's visits and meeting all other requirements Future Appointments No visits were found meeting these conditions. Showing future appointments within next 90 days and meeting all other requirements  I discussed the assessment and treatment plan with the patient. The patient was provided an opportunity to ask questions and all were answered. The patient agreed with the plan and demonstrated an understanding of the instructions.  Patient advised to call back or seek an in-person evaluation if the symptoms or condition worsens.  Duration of encounter: 30 minutes.  Note by: [name], MD Date: 10/30/2023; Time: 10:33 AM

## 2023-11-02 LAB — TOXASSURE SELECT 13 (MW), URINE

## 2024-01-24 ENCOUNTER — Encounter: Payer: Self-pay | Admitting: Student in an Organized Health Care Education/Training Program

## 2024-01-24 ENCOUNTER — Ambulatory Visit
Payer: Medicare HMO | Attending: Student in an Organized Health Care Education/Training Program | Admitting: Student in an Organized Health Care Education/Training Program

## 2024-01-24 VITALS — BP 147/90 | HR 88 | Temp 97.2°F | Ht 69.0 in | Wt 245.0 lb

## 2024-01-24 DIAGNOSIS — M1A39X1 Chronic gout due to renal impairment, multiple sites, with tophus (tophi): Secondary | ICD-10-CM | POA: Insufficient documentation

## 2024-01-24 DIAGNOSIS — G894 Chronic pain syndrome: Secondary | ICD-10-CM | POA: Insufficient documentation

## 2024-01-24 DIAGNOSIS — Z79899 Other long term (current) drug therapy: Secondary | ICD-10-CM | POA: Diagnosis present

## 2024-01-24 DIAGNOSIS — M79641 Pain in right hand: Secondary | ICD-10-CM | POA: Insufficient documentation

## 2024-01-24 DIAGNOSIS — G5603 Carpal tunnel syndrome, bilateral upper limbs: Secondary | ICD-10-CM | POA: Insufficient documentation

## 2024-01-24 DIAGNOSIS — Z96653 Presence of artificial knee joint, bilateral: Secondary | ICD-10-CM | POA: Diagnosis not present

## 2024-01-24 MED ORDER — OXYCODONE-ACETAMINOPHEN 10-325 MG PO TABS: 1.0000 | ORAL_TABLET | Freq: Four times a day (QID) | ORAL | 0 refills | Status: DC | PRN

## 2024-01-24 MED ORDER — OXYCODONE-ACETAMINOPHEN 10-325 MG PO TABS: 1.0000 | ORAL_TABLET | Freq: Four times a day (QID) | ORAL | 0 refills | Status: AC | PRN

## 2024-01-24 NOTE — Progress Notes (Signed)
 Safety precautions to be maintained throughout the outpatient stay will include: orient to surroundings, keep bed in low position, maintain call bell within reach at all times, provide assistance with transfer out of bed and ambulation.   Nursing Pain Medication Assessment:  Safety precautions to be maintained throughout the outpatient stay will include: orient to surroundings, keep bed in low position, maintain call bell within reach at all times, provide assistance with transfer out of bed and ambulation.  Medication Inspection Compliance: Pill count conducted under aseptic conditions, in front of the patient. Neither the pills nor the bottle was removed from the patient's sight at any time. Once count was completed pills were immediately returned to the patient in their original bottle.  Medication: Oxycodone/APAP Pill/Patch Count:  22 of 120 pills remain Pill/Patch Appearance: Markings consistent with prescribed medication Bottle Appearance: Standard pharmacy container. Clearly labeled. Filled Date: 2 / 17 / 2025 Last Medication intake:  Today

## 2024-01-24 NOTE — Progress Notes (Signed)
 PROVIDER NOTE: Information contained herein reflects review and annotations entered in association with encounter. Interpretation of such information and data should be left to medically-trained personnel. Information provided to patient can be located elsewhere in the medical record under "Patient Instructions". Document created using STT-dictation technology, any transcriptional errors that may result from process are unintentional.    Patient: Nathan Ramos  Service Category: E/M  Provider: [name], MD  DOB: 02/17/1971  DOS: 01/24/2024  Specialty: Interventional Pain Management  MRN: [id number]  Setting: Ambulatory outpatient  PCP: Nathan Ramos  Type: Established Patient    Referring Provider: [facility]  Location: Office  Delivery: Face-to-face     HPI  [name], a 53 y.o. year old male, is here today because of his Chronic pain syndrome [G89.4]. [name] primary complain today is Foot Pain  Last encounter: My last encounter with him was on 10/30/2023  Pertinent problems: [name] has Medullary cystic disease of the kidney; Chronic pain; Gout due to renal impairment; Primary localized osteoarthritis of right knee; Chronic pain syndrome; and Opioid use on their pertinent problem list. Pain Assessment: Severity of Chronic pain is reported as a 3 /10. Location: Foot Right, Left/denies. Onset: More than a month ago. Quality: Aching, Stabbing. Timing: Constant. Modifying factor(s): getting off feet, meds. Vitals:  height is 5\' 9"  (1.753 m) and weight is 245 lb (111.1 kg). His temperature is 97.2 F (36.2 C) (abnormal). His blood pressure is 147/90 (abnormal) and his pulse is 88. His oxygen saturation is 99%.   Reason for encounter: medication management.    No change in medical history since last visit.  Patient's pain is at baseline.  Patient continues multimodal pain regimen as prescribed.  States that it provides pain relief and improvement in  functional status.   Pharmacotherapy Assessment  Analgesic: Percocet 10 mg every 6 hours daily as needed, quantity 120/month, MME 60,    Monitoring: [place] PMP: PDMP reviewed during this encounter.       Pharmacotherapy: No side-effects or adverse reactions reported. Compliance: No problems identified. Effectiveness: Clinically acceptable.  UDS:  Summary  Date Value Ref Range Status  10/30/2023 FINAL  Final    Comment:    ==================================================================== [employer] Select 13 (MW) ==================================================================== Test                             Result       Flag       Units  Drug Present and Declared for Prescription Verification   Oxycodone                      1110         EXPECTED   ng/mg creat   Oxymorphone                    4889         EXPECTED   ng/mg creat   Noroxycodone                   1582         EXPECTED   ng/mg creat   Noroxymorphone                 853          EXPECTED   ng/mg creat    Sources of oxycodone are scheduled prescription medications.    Oxymorphone, noroxycodone,  and noroxymorphone are expected    metabolites of oxycodone. Oxymorphone is also available as a    scheduled prescription medication.  ==================================================================== Test                      Result    Flag   Units      Ref Range   Creatinine              62               mg/dL      >=16 ==================================================================== Declared Medications:  The flagging and interpretation on this report are based on the  following declared medications.  Unexpected results may arise from  inaccuracies in the declared medications.   **Note: The testing scope of this panel includes these medications:   Oxycodone (Percocet)   **Note: The testing scope of this panel does not include the  following reported medications:   Acetaminophen (Percocet)  Amlodipine  (Norvasc)  Aspirin  Febuxostat  Irbesartan (Avapro)  Metoprolol (Lopressor)  Naloxone (Narcan)  Rosuvastatin (Crestor) ==================================================================== For clinical consultation, please call [phone]. ====================================================================       ROS  Constitutional: Denies any fever or chills Gastrointestinal: No reported hemesis, hematochezia, vomiting, or acute GI distress Musculoskeletal: Diffuse arthralgias and myalgias Neurological: No reported episodes of acute onset apraxia, aphasia, dysarthria, agnosia, amnesia, paralysis, loss of coordination, or loss of consciousness  Medication Review  Febuxostat, amLODipine, aspirin, atorvastatin, irbesartan, metoprolol tartrate, naloxone, oxyCODONE-acetaminophen, and rosuvastatin  History Review  Allergy: [name] is allergic to nsaids. Drug: [name]  reports no history of drug use. Alcohol:  reports no history of alcohol use. Tobacco:  reports that he has quit smoking. His smoking use included cigarettes. He has a 11 pack-year smoking history. He has never used smokeless tobacco. Social: [name]  reports that he has quit smoking. His smoking use included cigarettes. He has a 11 pack-year smoking history. He has never used smokeless tobacco. He reports that he does not drink alcohol and does not use drugs. Medical:  has a past medical history of Anemia, Bone spur of other site, Chronic pain, CKD (chronic kidney disease), Controlled substance agreement signed, Gout, Gouty arthritis, History of YAG laser capsulotomy of lens of left eye, Hyperlipidemia, Hypertension, IFG (impaired fasting glucose), Left knee DJD, Medullary cystic disease of the kidney, Primary localized osteoarthritis of right knee, and Smoker. Surgical: [name]  has a past surgical history that includes Carpal tunnel release (Left); Umbilical hernia repair (2010); Cataract extraction w/ intraocular  lens implant (Left, 2008); Cataract extraction w/ intraocular lens implant (Right, 2008); Hernia repair; Total knee arthroplasty (Left, 03/30/2014); Eye surgery; Total knee arthroplasty (Right, 10/26/2014); Joint replacement (Left, 03/2014); Coronary/Graft Acute MI Revascularization (N/A, 04/12/2023); and LEFT HEART CATH AND CORONARY ANGIOGRAPHY (N/A, 04/12/2023). Family: family history includes COPD in his maternal grandmother and mother; Kidney disease in his mother; Stroke (age of onset: 53) in his father; Thyroid disease in his mother.  Laboratory Chemistry Profile   Renal Lab Results  Component Value Date   BUN 56 (H) 04/14/2023   CREATININE 3.77 (H) 04/14/2023   BCR 15 06/01/2020   GFRAA 27 (L) 06/01/2020   GFRNONAA 19 (L) 04/14/2023     Hepatic Lab Results  Component Value Date   AST 70 (H) 04/12/2023   ALT 20 04/12/2023   ALBUMIN 4.1 04/12/2023   ALKPHOS 122 04/12/2023     Electrolytes Lab Results  Component Value Date  NA 136 04/14/2023   K 5.3 (H) 04/14/2023   CL 109 04/14/2023   CALCIUM 9.0 04/14/2023     Bone No results found for: "VD25OH", "VD125OH2TOT", "ZO1096EA5", "WU9811BJ4", "25OHVITD1", "25OHVITD2", "25OHVITD3", "TESTOFREE", "TESTOSTERONE"   Inflammation (CRP: Acute Phase) (ESR: Chronic Phase) No results found for: "CRP", "ESRSEDRATE", "LATICACIDVEN"     Note: Above Lab results reviewed.  Recent Imaging Review  CARDIAC CATHETERIZATION   RPAV lesion is 100% stenosed.   Mid LAD lesion is 50% stenosed.   2nd Diag lesion is 50% stenosed.   A stent was successfully placed.   Post intervention, there is a 0% residual stenosis.   There is mild left ventricular systolic dysfunction.   LV end diastolic pressure is normal.   The left ventricular ejection fraction is 45-50% by visual estimate.   There is no mitral valve regurgitation.  Conclusion  STEMI presentation inferior wall Left ventricular function mildly depressed with inferior hypokinesis  ejection  fraction around 45 to 50%  Coronaries Left main large minor irregularities LAD large 50% mid LAD bifurcation with diagonal 50% Circumflex large minor irregularities RCA large 100% occluded PL TIMI 0 flow IRA  Intervention Successful PCI and stent of the PL 3.0 x 15 mm frontier Onyx to 14 atm  lesion reduced from 100 down to 0% TIMI-3 flow restored from TIMI 0  No complications Patient tolerated procedure well Recommend aspirin Brilinta for 12 months Patient transferred to ICU postprocedure  Note: Reviewed        Physical Exam  General appearance: alert, cooperative, and in mild distress presents today in wheelchair Mental status: Alert, oriented x 3 (person, place, & time)       Respiratory: No evidence of acute respiratory distress Eyes: PERLA Vitals: BP (!) 147/90   Pulse 88   Temp (!) 97.2 F (36.2 C)   Ht 5\' 9"  (1.753 m)   Wt 245 lb (111.1 kg)   SpO2 99%   BMI 36.18 kg/m  BMI: Estimated body mass index is 36.18 kg/m as calculated from the following:   Height as of this encounter: 5\' 9"  (1.753 m).   Weight as of this encounter: 245 lb (111.1 kg). Ideal: Ideal body weight: 70.7 kg (155 lb 13.8 oz) Adjusted ideal body weight: 86.9 kg (191 lb 8.3 oz)  Cervicalgia Bilatral knee pain   Assessment   Status Diagnosis  Controlled Controlled Controlled 1. Chronic pain syndrome   2. History of bilateral knee replacement   3. Bilateral carpal tunnel syndrome   4. 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 Right hand pain   6. Controlled substance agreement signed       Plan of Care   Nathan Ramos has a current medication list which includes the following long-term medication(s): amlodipine, irbesartan, and metoprolol tartrate.   Pharmacotherapy (Medications Ordered): Meds ordered this encounter  Medications   oxyCODONE-acetaminophen (PERCOCET) 10-325 MG tablet    Sig: Take 1 tablet by mouth every 6 (six) hours as needed for  pain.    Dispense:  120 tablet    Refill:  0   oxyCODONE-acetaminophen (PERCOCET) 10-325 MG tablet    Sig: Take 1 tablet by mouth every 6 (six) hours as needed for pain.    Dispense:  120 tablet    Refill:  0   oxyCODONE-acetaminophen (PERCOCET) 10-325 MG tablet    Sig: Take 1 tablet by mouth every 6 (six) hours as needed for pain.    Dispense:  120 tablet    Refill:  0    Follow-up plan:   Return in about 3 months (around 04/25/2024) for MM, F2F.   Recent Visits Date Type Provider Dept  10/30/23 Office Visit Nathan Jolly, MD [facility]  Showing recent visits within past 90 days and meeting all other requirements Today's Visits Date Type Provider Dept  01/24/24 Office Visit Nathan Jolly, MD [facility]  Showing today's visits and meeting all other requirements Future Appointments Date Type Provider Dept  04/22/24 Appointment Nathan Jolly, MD [facility]  Showing future appointments within next 90 days and meeting all other requirements  I discussed the assessment and treatment plan with the patient. The patient was provided an opportunity to ask questions and all were answered. The patient agreed with the plan and demonstrated an understanding of the instructions.  Patient advised to call back or seek an in-person evaluation if the symptoms or condition worsens.  Duration of encounter: 30 minutes.  Note by: Nathan Jolly, MD Date: 01/24/2024; Time: 11:20 AM

## 2024-01-30 ENCOUNTER — Other Ambulatory Visit: Payer: Self-pay | Admitting: Cardiovascular Disease

## 2024-04-22 ENCOUNTER — Encounter: Payer: Self-pay | Admitting: Nurse Practitioner

## 2024-04-22 ENCOUNTER — Ambulatory Visit: Attending: Nurse Practitioner | Admitting: Nurse Practitioner

## 2024-04-22 ENCOUNTER — Encounter: Admitting: Student in an Organized Health Care Education/Training Program

## 2024-04-22 VITALS — BP 131/82 | HR 87 | Temp 98.6°F | Ht 70.0 in | Wt 242.0 lb

## 2024-04-22 DIAGNOSIS — G894 Chronic pain syndrome: Secondary | ICD-10-CM | POA: Diagnosis not present

## 2024-04-22 DIAGNOSIS — M25521 Pain in right elbow: Secondary | ICD-10-CM | POA: Diagnosis not present

## 2024-04-22 DIAGNOSIS — M170 Bilateral primary osteoarthritis of knee: Secondary | ICD-10-CM | POA: Diagnosis not present

## 2024-04-22 DIAGNOSIS — M1A39X1 Chronic gout due to renal impairment, multiple sites, with tophus (tophi): Secondary | ICD-10-CM | POA: Diagnosis not present

## 2024-04-22 DIAGNOSIS — Z96653 Presence of artificial knee joint, bilateral: Secondary | ICD-10-CM | POA: Insufficient documentation

## 2024-04-22 DIAGNOSIS — M79641 Pain in right hand: Secondary | ICD-10-CM | POA: Diagnosis not present

## 2024-04-22 DIAGNOSIS — G5603 Carpal tunnel syndrome, bilateral upper limbs: Secondary | ICD-10-CM | POA: Diagnosis not present

## 2024-04-22 DIAGNOSIS — Z79899 Other long term (current) drug therapy: Secondary | ICD-10-CM | POA: Insufficient documentation

## 2024-04-22 DIAGNOSIS — M25572 Pain in left ankle and joints of left foot: Secondary | ICD-10-CM | POA: Diagnosis not present

## 2024-04-22 MED ORDER — OXYCODONE-ACETAMINOPHEN 10-325 MG PO TABS: 1.0000 | ORAL_TABLET | Freq: Four times a day (QID) | ORAL | 0 refills | Status: AC | PRN

## 2024-04-22 MED ORDER — NALOXONE HCL 4 MG/0.1ML NA LIQD: NASAL | 1 refills | Status: AC

## 2024-04-22 MED ORDER — OXYCODONE-ACETAMINOPHEN 10-325 MG PO TABS: 1.0000 | ORAL_TABLET | Freq: Four times a day (QID) | ORAL | 0 refills | Status: DC | PRN

## 2024-04-22 NOTE — Progress Notes (Signed)
 PROVIDER NOTE: Interpretation of information contained herein should be left to medically-trained personnel. Specific patient instructions are provided elsewhere under "Patient Instructions" section of medical record. This document was created in part using AI and STT-dictation technology, any transcriptional errors that may result from this process are unintentional.  Patient: Nathan Ramos  Service: E/M   [facility]: [name]  DOB: [birth year]  DOS: 04/22/2024  Provider: Cherylin Corrigan, NP  MRN: [id number]  Delivery: Face-to-face  Specialty: Interventional Pain Management  Type: Established Patient  Setting: Ambulatory outpatient facility  Specialty designation: 09  Referring Prov.: [facility]  Location: Outpatient office facility       History of present illness (HPI) [name], a 53 y.o. year old male, is here today because of his Primary osteoarthritis of both knees [M17.0]. Nathan Ramos primary complain today is Ankle Pain (Left ankle and right elbow)  Pain Assessment: Severity of Chronic pain is reported as a 6 /10. Location: Ankle Left (right elbow)/pt states he has generalized pain. Onset: More than a month ago. Quality: Stabbing. Timing: Intermittent. Modifying factor(s): meds. Vitals:  height is 5\' 10"  (1.778 m) and weight is 242 lb (109.8 kg). His temperature is 98.6 F (37 C). His blood pressure is 131/82 and his pulse is 87. His oxygen saturation is 98%.  BMI: Estimated body mass index is 34.72 kg/m as calculated from the following:   Height as of this encounter: 5\' 10"  (1.778 m).   Weight as of this encounter: 242 lb (109.8 kg).  Last encounter: 01/24/2024 Last procedure: Visit date not found.  Reason for encounter: medication management. No change in medical history since last visit.  Patient's pain is at baseline.  Patient continues multimodal pain regimen as prescribed.  States that it provides pain relief and improvement in functional status.    Pharmacotherapy Assessment   Analgesic: Oxycodone -acetaminophen  (Percocet) 10-325 mg tablet every 6 hours as needed for pain. MME=60 Monitoring: [place] PMP: PDMP reviewed during this encounter.       Pharmacotherapy: No side-effects or adverse reactions reported. Compliance: No problems identified. Effectiveness: Clinically acceptable.  Nathan Staple, RN  04/22/2024 10:54 AM  Sign when Signing Visit Safety precautions to be maintained throughout the outpatient stay will include: orient to surroundings, keep bed in low position, maintain call bell within reach at all times, provide assistance with transfer out of bed and ambulation.   Nursing Pain Medication Assessment:  Safety precautions to be maintained throughout the outpatient stay will include: orient to surroundings, keep bed in low position, maintain call bell within reach at all times, provide assistance with transfer out of bed and ambulation.  Medication Inspection Compliance: Pill count conducted under aseptic conditions, in front of the patient. Neither the pills nor the bottle was removed from the patient's sight at any time. Once count was completed pills were immediately returned to the patient in their original bottle.  Medication: Oxycodone /APAP Pill/Patch Count: 27 of 120 pills/patches remain Pill/Patch Appearance: Markings consistent with prescribed medication Bottle Appearance: Standard pharmacy container. Clearly labeled. Filled Date: 5 / 17 / 2025 Last Medication intake:  Today  UDS:  Summary  Date Value Ref Range Status  10/30/2023 FINAL  Final    Comment:    ==================================================================== [employer] Select 13 (MW) ==================================================================== Test                             Result  Flag       Units  Drug Present and Declared for Prescription Verification   Oxycodone                       1110         EXPECTED   ng/mg creat    Oxymorphone                    4889         EXPECTED   ng/mg creat   Noroxycodone                   1582         EXPECTED   ng/mg creat   Noroxymorphone                 853          EXPECTED   ng/mg creat    Sources of oxycodone  are scheduled prescription medications.    Oxymorphone, noroxycodone, and noroxymorphone are expected    metabolites of oxycodone . Oxymorphone is also available as a    scheduled prescription medication.  ==================================================================== Test                      Result    Flag   Units      Ref Range   Creatinine              62               mg/dL      >=78 ==================================================================== Declared Medications:  The flagging and interpretation on this report are based on the  following declared medications.  Unexpected results may arise from  inaccuracies in the declared medications.   **Note: The testing scope of this panel includes these medications:   Oxycodone  (Percocet)   **Note: The testing scope of this panel does not include the  following reported medications:   Acetaminophen  (Percocet)  Amlodipine  (Norvasc )  Aspirin   Febuxostat   Irbesartan  (Avapro )  Metoprolol  (Lopressor )  Naloxone  (Narcan )  Rosuvastatin  (Crestor ) ==================================================================== For clinical consultation, please call [phone]. ====================================================================     No results found for: "CBDTHCR" No results found for: "D8THCCBX" No results found for: "D9THCCBX"  ROS  Constitutional: Denies any fever or chills Gastrointestinal: No reported hemesis, hematochezia, vomiting, or acute GI distress Musculoskeletal: Left ankle pain, right elbow pain Neurological: No reported episodes of acute onset apraxia, aphasia, dysarthria, agnosia, amnesia, paralysis, loss of coordination, or loss of consciousness  Medication Review   Febuxostat , amLODipine , aspirin , atorvastatin , irbesartan , metoprolol  tartrate, naloxone , oxyCODONE -acetaminophen , and rosuvastatin   History Review  Allergy: Nathan Ramos is allergic to nsaids. Drug: Mr. Vanwey  reports no history of drug use. Alcohol:  reports no history of alcohol use. Tobacco:  reports that he has quit smoking. His smoking use included cigarettes. He has a 11 pack-year smoking history. He has never used smokeless tobacco. Social: Mr. Hutcherson  reports that he has quit smoking. His smoking use included cigarettes. He has a 11 pack-year smoking history. He has never used smokeless tobacco. He reports that he does not drink alcohol and does not use drugs. Medical:  has a past medical history of Anemia, Bone spur of other site, Chronic pain, CKD (chronic kidney disease), Controlled substance agreement signed, Gout, Gouty arthritis, History of YAG laser capsulotomy of lens of left eye, Hyperlipidemia, Hypertension, IFG (impaired fasting glucose), Left knee DJD, Medullary cystic disease of the kidney, Primary  localized osteoarthritis of right knee, and Smoker. Surgical: Mr. Penson  has a past surgical history that includes Carpal tunnel release (Left); Umbilical hernia repair (2010); Cataract extraction w/ intraocular lens implant (Left, 2008); Cataract extraction w/ intraocular lens implant (Right, 2008); Hernia repair; Total knee arthroplasty (Left, 03/30/2014); Eye surgery; Total knee arthroplasty (Right, 10/26/2014); Joint replacement (Left, 03/2014); Coronary/Graft Acute MI Revascularization (N/A, 04/12/2023); and LEFT HEART CATH AND CORONARY ANGIOGRAPHY (N/A, 04/12/2023). Family: family history includes COPD in his maternal grandmother and mother; Kidney disease in his mother; Stroke (age of onset: 60) in his father; Thyroid  disease in his mother.  Laboratory Chemistry Profile   Renal Lab Results  Component Value Date   BUN 56 (H) 04/14/2023   CREATININE 3.77 (H) 04/14/2023   BCR 15  06/01/2020   GFRAA 27 (L) 06/01/2020   GFRNONAA 19 (L) 04/14/2023    Hepatic Lab Results  Component Value Date   AST 70 (H) 04/12/2023   ALT 20 04/12/2023   ALBUMIN 4.1 04/12/2023   ALKPHOS 122 04/12/2023    Electrolytes Lab Results  Component Value Date   NA 136 04/14/2023   K 5.3 (H) 04/14/2023   CL 109 04/14/2023   CALCIUM  9.0 04/14/2023    Bone No results found for: "VD25OH", "VD125OH2TOT", "WU9811BJ4", "NW2956OZ3", "25OHVITD1", "25OHVITD2", "25OHVITD3", "TESTOFREE", "TESTOSTERONE"  Inflammation (CRP: Acute Phase) (ESR: Chronic Phase) No results found for: "CRP", "ESRSEDRATE", "LATICACIDVEN"       Note: Above Lab results reviewed.  Recent Imaging Review  CARDIAC CATHETERIZATION   RPAV lesion is 100% stenosed.   Mid LAD lesion is 50% stenosed.   2nd Diag lesion is 50% stenosed.   A stent was successfully placed.   Post intervention, there is a 0% residual stenosis.   There is mild left ventricular systolic dysfunction.   LV end diastolic pressure is normal.   The left ventricular ejection fraction is 45-50% by visual estimate.   There is no mitral valve regurgitation.  Conclusion  STEMI presentation inferior wall Left ventricular function mildly depressed with inferior hypokinesis  ejection fraction around 45 to 50%  Coronaries Left main large minor irregularities LAD large 50% mid LAD bifurcation with diagonal 50% Circumflex large minor irregularities RCA large 100% occluded PL TIMI 0 flow IRA  Intervention Successful PCI and stent of the PL 3.0 x 15 mm frontier Onyx to 14 atm  lesion reduced from 100 down to 0% TIMI-3 flow restored from TIMI 0  No complications Patient tolerated procedure well Recommend aspirin  Brilinta  for 12 months Patient transferred to ICU postprocedure Note: Reviewed         Physical Exam  General appearance: Well nourished, well developed, and well hydrated. In no apparent acute distress Mental status: Alert, oriented x 3  (person, place, & time)       Respiratory: No evidence of acute respiratory distress Eyes: PERLA Vitals: BP 131/82   Pulse 87   Temp 98.6 F (37 C)   Ht 5\' 10"  (1.778 m)   Wt 242 lb (109.8 kg)   SpO2 98%   BMI 34.72 kg/m  BMI: Estimated body mass index is 34.72 kg/m as calculated from the following:   Height as of this encounter: 5\' 10"  (1.778 m).   Weight as of this encounter: 242 lb (109.8 kg). Ideal: Ideal body weight: 73 kg (160 lb 15 oz) Adjusted ideal body weight: 87.7 kg (193 lb 5.8 oz)  Assessment   Diagnosis Status  1. Primary osteoarthritis of both knees   2. Chronic  pain syndrome   3. History of bilateral knee replacement   4. Bilateral carpal tunnel syndrome   5. 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 Right hand pain   7. Controlled substance agreement signed   8. Medication management    Controlled Controlled Controlled   Updated Problems: No problems updated.  Plan of Care  Problem-specific:  Assessment and Plan We will continue on current medication regimen.  Prescribing drug monitoring (PDMP) reviewed; findings consistent with the use of prescribed medication and no evidence of narcotic misuse or abuse.  UDS up-to-date.  Schedule follow-up in 90 days for medication management.  No other new issues or problems reported to this visit.   Mr. MADSEN RIDDLE has a current medication list which includes the following long-term medication(s): amlodipine , atorvastatin , irbesartan , and metoprolol  tartrate.  Pharmacotherapy (Medications Ordered): Meds ordered this encounter  Medications   oxyCODONE -acetaminophen  (PERCOCET) 10-325 MG tablet    Sig: Take 1 tablet by mouth every 6 (six) hours as needed for pain.    Dispense:  120 tablet    Refill:  0   oxyCODONE -acetaminophen  (PERCOCET) 10-325 MG tablet    Sig: Take 1 tablet by mouth every 6 (six) hours as needed for pain.    Dispense:  120 tablet    Refill:  0    oxyCODONE -acetaminophen  (PERCOCET) 10-325 MG tablet    Sig: Take 1 tablet by mouth every 6 (six) hours as needed for pain.    Dispense:  120 tablet    Refill:  0   Orders:  No orders of the defined types were placed in this encounter.       Return in about 3 months (around 07/23/2024) for (F2F), (MM), Marthe Slain NP.    Recent Visits Date Type Provider Dept  01/24/24 Office Visit Cephus Collin, MD [facility]  Showing recent visits within past 90 days and meeting all other requirements Today's Visits Date Type Provider Dept  04/22/24 Office Visit Chandlor Noecker K, NP [facility]  Showing today's visits and meeting all other requirements Future Appointments No visits were found meeting these conditions. Showing future appointments within next 90 days and meeting all other requirements  I discussed the assessment and treatment plan with the patient. The patient was provided an opportunity to ask questions and all were answered. The patient agreed with the plan and demonstrated an understanding of the instructions.  Patient advised to call back or seek an in-person evaluation if the symptoms or condition worsens.  Duration of encounter: 30 minutes.  Total time on encounter, as per AMA guidelines included both the face-to-face and non-face-to-face time personally spent by the physician and/or other qualified health care professional(s) on the day of the encounter (includes time in activities that require the physician or other qualified health care professional and does not include time in activities normally performed by clinical staff). Physician's time may include the following activities when performed: Preparing to see the patient (e.g., pre-charting review of records, searching for previously ordered imaging, lab work, and nerve conduction tests) Review of prior analgesic pharmacotherapies. Reviewing PMP Interpreting ordered tests (e.g., lab work, imaging, nerve  conduction tests) Performing post-procedure evaluations, including interpretation of diagnostic procedures Obtaining and/or reviewing separately obtained history Performing a medically appropriate examination and/or evaluation Counseling and educating the patient/family/caregiver Ordering medications, tests, or procedures Referring and communicating with other health care professionals (when not separately reported) Documenting clinical information in the electronic or other  health record Independently interpreting results (not separately reported) and communicating results to the patient/ family/caregiver Care coordination (not separately reported)  Note by: Zamarah Ullmer K Francenia Chimenti, NP (TTS and AI technology used. I apologize for any typographical errors that were not detected and corrected.) Date: 04/22/2024; Time: 11:19 AM

## 2024-04-22 NOTE — Progress Notes (Signed)
 Safety precautions to be maintained throughout the outpatient stay will include: orient to surroundings, keep bed in low position, maintain call bell within reach at all times, provide assistance with transfer out of bed and ambulation.   Nursing Pain Medication Assessment:  Safety precautions to be maintained throughout the outpatient stay will include: orient to surroundings, keep bed in low position, maintain call bell within reach at all times, provide assistance with transfer out of bed and ambulation.  Medication Inspection Compliance: Pill count conducted under aseptic conditions, in front of the patient. Neither the pills nor the bottle was removed from the patient's sight at any time. Once count was completed pills were immediately returned to the patient in their original bottle.  Medication: Oxycodone /APAP Pill/Patch Count: 27 of 120 pills/patches remain Pill/Patch Appearance: Markings consistent with prescribed medication Bottle Appearance: Standard pharmacy container. Clearly labeled. Filled Date: 5 / 17 / 2025 Last Medication intake:  Today
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
## 2024-06-02 DIAGNOSIS — N184 Chronic kidney disease, stage 4 (severe): Secondary | ICD-10-CM | POA: Diagnosis not present

## 2024-06-02 DIAGNOSIS — I129 Hypertensive chronic kidney disease with stage 1 through stage 4 chronic kidney disease, or unspecified chronic kidney disease: Secondary | ICD-10-CM | POA: Diagnosis not present

## 2024-06-02 DIAGNOSIS — Q615 Medullary cystic kidney: Secondary | ICD-10-CM | POA: Diagnosis not present

## 2024-06-02 DIAGNOSIS — M1A00X1 Idiopathic chronic gout, unspecified site, with tophus (tophi): Secondary | ICD-10-CM | POA: Diagnosis not present

## 2024-06-02 DIAGNOSIS — E875 Hyperkalemia: Secondary | ICD-10-CM | POA: Diagnosis not present

## 2024-06-09 NOTE — Progress Notes (Signed)
 Sent patient a copy of most recent lab results.

## 2024-06-24 ENCOUNTER — Other Ambulatory Visit: Payer: Self-pay | Admitting: Cardiovascular Disease

## 2024-06-24 DIAGNOSIS — N184 Chronic kidney disease, stage 4 (severe): Secondary | ICD-10-CM

## 2024-06-24 DIAGNOSIS — I2111 ST elevation (STEMI) myocardial infarction involving right coronary artery: Secondary | ICD-10-CM

## 2024-06-24 DIAGNOSIS — I151 Hypertension secondary to other renal disorders: Secondary | ICD-10-CM

## 2024-06-24 DIAGNOSIS — I5189 Other ill-defined heart diseases: Secondary | ICD-10-CM

## 2024-06-24 DIAGNOSIS — E781 Pure hyperglyceridemia: Secondary | ICD-10-CM

## 2024-06-24 DIAGNOSIS — I340 Nonrheumatic mitral (valve) insufficiency: Secondary | ICD-10-CM

## 2024-06-24 DIAGNOSIS — I2510 Atherosclerotic heart disease of native coronary artery without angina pectoris: Secondary | ICD-10-CM

## 2024-06-27 ENCOUNTER — Encounter: Payer: Self-pay | Admitting: Student in an Organized Health Care Education/Training Program

## 2024-06-27 ENCOUNTER — Telehealth: Payer: Self-pay | Admitting: Student in an Organized Health Care Education/Training Program

## 2024-06-27 ENCOUNTER — Other Ambulatory Visit: Payer: Self-pay

## 2024-06-27 ENCOUNTER — Emergency Department
Admission: EM | Admit: 2024-06-27 | Discharge: 2024-06-27 | Disposition: A | Discharge status: Home / Self Care | Attending: Emergency Medicine | Admitting: Emergency Medicine

## 2024-06-27 DIAGNOSIS — Z79899 Other long term (current) drug therapy: Secondary | ICD-10-CM

## 2024-06-27 DIAGNOSIS — Z760 Encounter for issue of repeat prescription: Secondary | ICD-10-CM | POA: Insufficient documentation

## 2024-06-27 DIAGNOSIS — N184 Chronic kidney disease, stage 4 (severe): Secondary | ICD-10-CM | POA: Diagnosis not present

## 2024-06-27 DIAGNOSIS — F1123 Opioid dependence with withdrawal: Secondary | ICD-10-CM | POA: Diagnosis not present

## 2024-06-27 DIAGNOSIS — F1193 Opioid use, unspecified with withdrawal: Secondary | ICD-10-CM | POA: Insufficient documentation

## 2024-06-27 DIAGNOSIS — I129 Hypertensive chronic kidney disease with stage 1 through stage 4 chronic kidney disease, or unspecified chronic kidney disease: Secondary | ICD-10-CM | POA: Diagnosis not present

## 2024-06-27 MED ORDER — OXYCODONE-ACETAMINOPHEN 5-325 MG PO TABS
2.0000 | ORAL_TABLET | Freq: Once | ORAL | Status: AC
  Administered 2024-06-27: 2 via ORAL
  Filled 2024-06-27: qty 2

## 2024-06-27 NOTE — ED Notes (Signed)
 RN to bedside to introduce self to pt. Pt is caox4, in no acute distress. Per [facility] pt meds have been called in but will have to wait until tomorrow to pick up due to 30 day prescription.

## 2024-06-27 NOTE — Discharge Instructions (Addendum)
 You were seen in the emergency department for chronic pain with opioid use.  Please try contacting the pharmacies listed below to help with refill of your medication.  Please follow-up in the emergency department as needed for any symptoms of opioid withdrawal as listed in the attached paperwork or for any new, worsening, or concerning symptoms. I will contact you if I hear back from Dr. Marcelino.  [facility] [phone]

## 2024-06-27 NOTE — ED Provider Notes (Signed)
 [facility] Provider Note    Event Date/Time   First MD Initiated Contact with Patient 06/27/24 1704     (approximate)   History   Medication Refill   HPI  Nathan Ramos is a 53 y.o. male  with a past medical history of STEMI, CKD stage IV, chronic pain syndrome and chronic opioid use, hypertension, gout, right knee OA, medullary cystic disease of the kidney presents to the emergency department with need for medication refill for chronic opioid use.  Patient states he takes oxycodone  10 mg -325 mg acetaminophen  4 times daily as needed for chronic pain.  He is an established patient with Dr. Marcelino who manages his chronic pain and pain contract.  Patient has [facility] [address] [facility] [place], but they are out of the medication. [facility] for [facility], and they stated they could not fill it due to being on narcotic.  Patient also attempted to contact Dr. Charolotte office, but states they closed today around 12 PM.  Patient states his last dose was around 16 hours ago. Patient's last fill of the medication was 05/28/2024.     Physical Exam   Triage Vital Signs: ED Triage Vitals  Encounter Vitals Group     BP 06/27/24 1630 (!) 155/78     Girls Systolic BP Percentile --      Girls Diastolic BP Percentile --      Boys Systolic BP Percentile --      Boys Diastolic BP Percentile --      Pulse Rate 06/27/24 1630 97     Resp 06/27/24 1630 18     Temp 06/27/24 1630 98.5 F (36.9 C)     Temp Source 06/27/24 1630 Oral     SpO2 06/27/24 1630 98 %     Weight 06/27/24 1630 232 lb (105.2 kg)     Height 06/27/24 1630 5' 10 (1.778 m)     Head Circumference --      Peak Flow --      Pain Score 06/27/24 1633 7     Pain Loc --      Pain Education --      Exclude from Growth Chart --     Most recent vital signs: Vitals:   06/27/24 1630 06/27/24 1939  BP: (!) 155/78 (!)  140/95  Pulse: 97 76  Resp: 18 17  Temp: 98.5 F (36.9 C) 98.6 F (37 C)  SpO2: 98% 100%    General: Awake, in no acute distress. Appears stated age. CV: Good peripheral perfusion. No edema. Respiratory: Normal respiratory effort. GI: No distention. MSK: Moving all extremities with ease.  ED Results / Procedures / Treatments   Labs (all labs ordered are listed, but only abnormal results are displayed) Labs Reviewed - No data to display   EKG     RADIOLOGY    PROCEDURES:  Critical Care performed: No   Procedures   MEDICATIONS ORDERED IN ED: Medications  oxyCODONE -acetaminophen  (PERCOCET/ROXICET) 5-325 MG per tablet 2 tablet (2 tablets Oral Given 06/27/24 1838)     IMPRESSION / MDM / ASSESSMENT AND PLAN / ED COURSE  I reviewed the triage vital signs and the nursing notes.                              Differential diagnosis includes, but is not  limited to, medication refill, chronic opioid use, opioid withdrawal  Patient's presentation is most consistent with acute, uncomplicated illness.  Patient is presenting with concern for opioid withdrawal while on chronic opioids.  Patient's story is consistent with the PDMP as far as filling his medications; last fill on 7/16.  Patient was given 1 dose here.  We discussed that he can come back for further doses as needed based on his pain contract. I reviewed patient's note from 01/24/2024 with Dr. Marcelino for chronic pain management.  I have provided him with contact for a pharmacy in the [place] area to see if they would fill his prescription although I am not sure if this would breach his pain contract with Dr. Marcelino.  Attempted to get in touch with Dr. Marcelino via Epic chat, as his office is closed, but did not receive a response during the patient's time in the ED. Told the patient I will contact them with any updates if I heard from his provider.  Patient is going to follow-up with the pain clinic on Monday morning and  in the emergency department if any further doses are needed, withdrawal symptoms, or for any new, worsening, or concerning symptoms.  Patient was given the opportunity to ask questions; all questions were answered. Emergency department return precautions were discussed with the patient.  Patient is in agreement to the treatment plan.  Patient is stable for discharge.    FINAL CLINICAL IMPRESSION(S) / ED DIAGNOSES   Final diagnoses:  Opioid withdrawal (HCC)  Medication management     Rx / DC Orders   ED Discharge Orders     None        Note:  This document was prepared using Dragon voice recognition software and may include unintentional dictation errors.     Sheron Salm, PA-C 06/27/24 1944    Levander Slate, MD 06/30/24 [phone]

## 2024-06-27 NOTE — Telephone Encounter (Signed)
 Spoke with patient and he states that [facility], they have ordered it and should have it by the 18th.  He does not feel he can wait.  States it was d/t fill today, does not have any left over.  Asked if he could find a pharmacy that had supply I would try and get in touch with Dr Marcelino but here would be no guarantee.  States pharmacy  will not tell him or talk to him.  Also,  if he did that Dr Marcelino would have to write for a different medication without tylenol  otherwise insurance would not cover, conversation as to why this would be etc.  Finally, I have told the patient that I am sorry he can't get the medication today.   we are closed today and I do not have a provider available.  He has mentioned going to the ED to get his medications.  If he feels that is necessary he can do that but I didn't think they would write for the medication.  He states he had to do this last time this happened and they made him take it at the hospital and he had to go back for next dose.  At this point, I told patient that I am sorry [facility], if it is on order he will need to pick it up when it comes in.

## 2024-06-27 NOTE — Telephone Encounter (Signed)
 [facility] and he needs to know what to do. They wont sent script anywhere.

## 2024-06-27 NOTE — ED Triage Notes (Addendum)
 Pt comes with needing medication refill. Pt states he is out of his oxycodone  10-325. Pt states it isn't available at the pharmacy.

## 2024-06-28 ENCOUNTER — Emergency Department
Admission: EM | Admit: 2024-06-28 | Discharge: 2024-06-28 | Disposition: A | Discharge status: Home / Self Care | Attending: Emergency Medicine | Admitting: Emergency Medicine

## 2024-06-28 ENCOUNTER — Other Ambulatory Visit: Payer: Self-pay

## 2024-06-28 DIAGNOSIS — F1129 Opioid dependence with unspecified opioid-induced disorder: Secondary | ICD-10-CM

## 2024-06-28 DIAGNOSIS — Z7982 Long term (current) use of aspirin: Secondary | ICD-10-CM | POA: Insufficient documentation

## 2024-06-28 DIAGNOSIS — F1123 Opioid dependence with withdrawal: Secondary | ICD-10-CM | POA: Insufficient documentation

## 2024-06-28 DIAGNOSIS — I129 Hypertensive chronic kidney disease with stage 1 through stage 4 chronic kidney disease, or unspecified chronic kidney disease: Secondary | ICD-10-CM | POA: Insufficient documentation

## 2024-06-28 DIAGNOSIS — F11288 Opioid dependence with other opioid-induced disorder: Secondary | ICD-10-CM | POA: Insufficient documentation

## 2024-06-28 DIAGNOSIS — N184 Chronic kidney disease, stage 4 (severe): Secondary | ICD-10-CM | POA: Diagnosis not present

## 2024-06-28 MED ORDER — OXYCODONE-ACETAMINOPHEN 5-325 MG PO TABS
2.0000 | ORAL_TABLET | Freq: Once | ORAL | Status: AC
  Administered 2024-06-28: 2 via ORAL
  Filled 2024-06-28: qty 2

## 2024-06-28 NOTE — ED Provider Notes (Signed)
 [place] EMERGENCY DEPARTMENT AT [facility] Provider Note   CSN: [id number] Arrival date & time: 06/28/24  9283     Patient presents with: Pain   Nathan Ramos is a 53 y.o. male.   Patient here for pain.  Noting history of chronic pain on oxycodone  4 times daily.  He is due for refill however his pharmacy is out of stock shows on his phone that they are out of stock unable to fill.  It is the weekend and he is unable to get in with his pain doctor.  He was seen here yesterday for same and recommended come back when he needs a dose.  Would not like a prescription sent in as it would avoid his pain contract.        Prior to Admission medications   Medication Sig Start Date End Date Taking? Authorizing Provider  amLODipine  (NORVASC ) 10 MG tablet TAKE 1 TABLET BY MOUTH EVERY DAY 06/24/24   Scoggins, Hospital doctor, NP  aspirin  81 MG chewable tablet Chew 1 tablet (81 mg total) by mouth daily. 04/15/23   Leotis Bogus, MD  atorvastatin  (LIPITOR) 80 MG tablet TAKE 1 TABLET BY MOUTH EVERY DAY 01/30/24   Fernand Denyse LABOR, MD  Febuxostat  80 MG TABS Take 1 tablet by mouth every other day.     [provider]  irbesartan  (AVAPRO ) 150 MG tablet TAKE 1 TABLET BY MOUTH EVERY DAY 07/13/23   Fernand Denyse LABOR, MD  metoprolol  tartrate (LOPRESSOR ) 25 MG tablet TAKE 1 TABLET BY MOUTH TWICE A DAY 07/13/23   Fernand Denyse LABOR, MD  naloxone  (NARCAN ) nasal spray 4 mg/0.1 mL Use in case of accidental overdose with narcotics / opioids 04/22/24   Patel, Seema K, NP  oxyCODONE -acetaminophen  (PERCOCET) 10-325 MG tablet Take 1 tablet by mouth every 6 (six) hours as needed for pain. 06/27/24 07/27/24  Patel, Seema K, NP  rosuvastatin  (CRESTOR ) 20 MG tablet Take 20 mg by mouth daily. 08/11/23   [provider]    Allergies: Nsaids    Review of Systems  Constitutional:  Positive for fatigue. Negative for chills and fever.  HENT:  Negative for congestion.   Respiratory:  Negative for cough and shortness  of breath.   Cardiovascular:  Negative for chest pain.  Gastrointestinal:  Positive for nausea. Negative for abdominal pain, diarrhea and vomiting.  Genitourinary:  Negative for dysuria.  Neurological:  Positive for headaches. Negative for weakness and numbness.    Updated Vital Signs BP (!) 162/81   Pulse 95   Temp 98.5 F (36.9 C) (Oral)   Resp 18   Ht 5' 10 (1.778 m)   Wt 105.2 kg   SpO2 100%   BMI 33.29 kg/m   Physical Exam Vitals reviewed.  Constitutional:      Appearance: Normal appearance.  HENT:     Head: Normocephalic and atraumatic.     Nose: Nose normal.  Cardiovascular:     Pulses: Normal pulses.  Pulmonary:     Effort: Pulmonary effort is normal.  Musculoskeletal:     Cervical back: Normal range of motion.  Neurological:     Mental Status: He is alert and oriented to person, place, and time. Mental status is at baseline.  Psychiatric:        Mood and Affect: Mood normal.        Behavior: Behavior normal.     (all labs ordered are listed, but only abnormal results are displayed) Labs Reviewed - No data to display  EKG: None  Radiology: No results found.   Procedures   Medications Ordered in the ED  oxyCODONE -acetaminophen  (PERCOCET/ROXICET) 5-325 MG per tablet 2 tablet (has no administration in time range)                                    Medical Decision Making Patient here for generalized pain on pain contract he has been out of his oxycodone  since yesterday did get a dose here last night however he is completely out and his pharmacy is out of stock.  I reviewed the PDMP that does show he is scheduled for a refill apparently his pharmacy is out.  He has generalized nausea fatigue and headaches most likely consistent with mild opioid withdrawal.  No acute distress.  He is hemodynamically stable.  Will medicate him here.  Recommended [facility] a local [facility] that would [facility] it in stock so that they can transfer over the prescription.  He can  return here as needed otherwise but he lives almost 1 hour away and would not likely want to come back simply for treatment.  He will otherwise follow-up with his pain doctor.  Risk Prescription drug management.        Final diagnoses:  Opioid dependence with opioid-induced disorder [facility])    ED Discharge Orders     None          Kingston Mallick, [place] 06/28/24 9250    Suzanne Kirsch, MD 06/28/24 1606

## 2024-06-28 NOTE — ED Provider Notes (Signed)
 [facility] Provider Note    Event Date/Time   First MD Initiated Contact with Patient 06/28/24 2008     (approximate)   History   Withdrawal   HPI  Nathan Ramos is a 53 y.o. male  with a past medical history of STEMI, CKD stage IV, chronic pain syndrome and chronic opioid use, hypertension, gout, right knee OA, medullary cystic disease of the kidney presents to the emergency department for chronic pain.  Patient does have a history of taking oxycodone -acetaminophen  10-325 mg QID and chronic pain managed by Dr. Wallie Sherry.  Patient was seen here yesterday and last night for the same and recommended to come back when he needs an additional dose since he is having trouble getting the medication at his pharmacy.  No other concerns at this time.     Physical Exam   Triage Vital Signs: ED Triage Vitals  Encounter Vitals Group     BP 06/28/24 1936 139/88     Girls Systolic BP Percentile --      Girls Diastolic BP Percentile --      Boys Systolic BP Percentile --      Boys Diastolic BP Percentile --      Pulse Rate 06/28/24 1936 96     Resp 06/28/24 1936 18     Temp 06/28/24 1936 98.6 F (37 C)     Temp Source 06/28/24 1936 Oral     SpO2 06/28/24 1936 98 %     Weight 06/28/24 1937 231 lb 14.8 oz (105.2 kg)     Height 06/28/24 1937 5' 10 (1.778 m)     Head Circumference --      Peak Flow --      Pain Score 06/28/24 1936 6     Pain Loc --      Pain Education --      Exclude from Growth Chart --     Most recent vital signs: Vitals:   06/28/24 1936 06/28/24 2034  BP: 139/88 130/78  Pulse: 96 88  Resp: 18 18  Temp: 98.6 F (37 C) 98.7 F (37.1 C)  SpO2: 98% 100%    General: Awake, in no acute distress. Appears stated age. CV: Good peripheral perfusion. No edema. Respiratory: Normal respiratory effort. GI: No distention. MSK: Moving all extremities with ease.  ED Results / Procedures / Treatments   Labs (all labs ordered are listed,  but only abnormal results are displayed) Labs Reviewed - No data to display   EKG     RADIOLOGY    PROCEDURES:  Critical Care performed: No   Procedures   MEDICATIONS ORDERED IN ED: Medications  oxyCODONE -acetaminophen  (PERCOCET/ROXICET) 5-325 MG per tablet 2 tablet (2 tablets Oral Given 06/28/24 2019)     IMPRESSION / MDM / ASSESSMENT AND PLAN / ED COURSE  I reviewed the triage vital signs and the nursing notes.                              Differential diagnosis includes, but is not limited to, opioid dependence, medication management for chronic pain, opioid withdrawal  Patient's presentation is most consistent with exacerbation of chronic illness.  Reviewed notes from yesterday and earlier this morning regarding patient's need for oxycodone -acetaminophen  10-325 management for chronic opioid use.  Patient has still been unable to get in contact with his pain management doctor.  Reports his pharmacy is still completely out of stock of  the medication.  PDMP was reviewed and last scheduled fill was on 7/16, and he is due for his next refill.  All of his vital signs are within normal range.  No acute distress.  Recommended continue trying to call local pharmacies in the [facility] area to see if they would transfer the prescription over to a place that has it in stock.  He can return here for any further doses as needed, but he does live 1 hour away.  He should follow-up with his chronic pain doctor.  The patient may return to the emergency department for any new, worsening, or concerning symptoms. Patient was given the opportunity to ask questions; all questions were answered. Emergency department return precautions were discussed with the patient.  Patient is in agreement to the treatment plan.  Patient is stable for discharge.      FINAL CLINICAL IMPRESSION(S) / ED DIAGNOSES   Final diagnoses:  Opioid dependence with withdrawal (HCC)     Rx / DC Orders   ED  Discharge Orders     None        Note:  This document was prepared using Dragon voice recognition software and may include unintentional dictation errors.     Sheron Salm, PA-C 06/28/24 2041    Waymond Lorelle Cummins, MD 06/28/24 2109

## 2024-06-28 NOTE — ED Triage Notes (Addendum)
 Pt reports he is here for refill of his oxycodone  10-325. Pt follows with [facility] and their oxycodone  are out of stock. Pt is on a pain contract with [facility] and informed Pt to come back to the ER every time he needs pan meds. [facility] states the oxy will be refilled at [facility] on Monday 18th. GCS 15, ambulatory in triage

## 2024-06-28 NOTE — Discharge Instructions (Addendum)
 Please return to the emergency department for any symptoms of opioid withdrawal or for need of any further doses until you can follow-up with your chronic pain management doctor on Monday.  You can return to the emergency department for any new, worsening, or concerning symptoms.

## 2024-06-28 NOTE — ED Triage Notes (Signed)
 Pt presented to ED POV for refill of oxycodone  10-325. States has a pain contract with [facility] and the pharmacy is out of stock, however, with his pain contract he is not allow to change pharmacies to one that carries medication. Was told that pain medication will not be refilled until Monday 06/30/24 night. States ran out of oxy Thursday night. Was seen at ED last night and this morning, told to come to ED every 6 hours for medication. GCS 15. A&Ox4

## 2024-06-29 ENCOUNTER — Emergency Department
Admission: EM | Admit: 2024-06-29 | Discharge: 2024-06-29 | Disposition: A | Discharge status: Home / Self Care | Attending: Emergency Medicine | Admitting: Emergency Medicine

## 2024-06-29 ENCOUNTER — Other Ambulatory Visit: Payer: Self-pay

## 2024-06-29 ENCOUNTER — Encounter: Payer: Self-pay | Admitting: Intensive Care

## 2024-06-29 DIAGNOSIS — G8929 Other chronic pain: Secondary | ICD-10-CM | POA: Diagnosis not present

## 2024-06-29 DIAGNOSIS — N184 Chronic kidney disease, stage 4 (severe): Secondary | ICD-10-CM | POA: Insufficient documentation

## 2024-06-29 DIAGNOSIS — G894 Chronic pain syndrome: Secondary | ICD-10-CM | POA: Insufficient documentation

## 2024-06-29 DIAGNOSIS — F1124 Opioid dependence with opioid-induced mood disorder: Secondary | ICD-10-CM | POA: Insufficient documentation

## 2024-06-29 DIAGNOSIS — F1129 Opioid dependence with unspecified opioid-induced disorder: Secondary | ICD-10-CM

## 2024-06-29 DIAGNOSIS — F11288 Opioid dependence with other opioid-induced disorder: Secondary | ICD-10-CM | POA: Diagnosis present

## 2024-06-29 DIAGNOSIS — I129 Hypertensive chronic kidney disease with stage 1 through stage 4 chronic kidney disease, or unspecified chronic kidney disease: Secondary | ICD-10-CM | POA: Diagnosis not present

## 2024-06-29 DIAGNOSIS — F1120 Opioid dependence, uncomplicated: Secondary | ICD-10-CM | POA: Diagnosis not present

## 2024-06-29 MED ORDER — OXYCODONE-ACETAMINOPHEN 5-325 MG PO TABS
2.0000 | ORAL_TABLET | Freq: Once | ORAL | Status: AC
  Administered 2024-06-29: 2 via ORAL
  Filled 2024-06-29: qty 2

## 2024-06-29 NOTE — ED Triage Notes (Signed)
 Patient reports he is here for pain management. Generalized pain all over

## 2024-06-29 NOTE — ED Notes (Signed)
 See triage note  Presents for pain medication  [facility] evening

## 2024-06-29 NOTE — Discharge Instructions (Addendum)
 Please follow-up with your chronic pain management doctor as soon as possible.  You may return to the emergency department for further dosing until you can get your next prescription.  You may also follow-up in the emergency department for any new, worsening, or concerning symptoms.

## 2024-06-29 NOTE — ED Triage Notes (Signed)
 Pt to ED for a dose of oxycodone . States chronic generalized pain, last dose was when I was here this morning. Pt irritable in triage.   Takes 10mg  4 times daily. Goes to pain clinic.

## 2024-06-29 NOTE — ED Provider Notes (Signed)
 [facility] Provider Note    Event Date/Time   First MD Initiated Contact with Patient 06/29/24 1818     (approximate)   History   Pain   HPI  Nathan Ramos is a 53 y.o. male  with a past medical history of STEMI, CKD stage IV, chronic pain syndrome and chronic opioid use, hypertension, gout, right knee OA, medullary cystic disease of the kidney presents to the emergency department for chronic pain managed by Dr. Wallie Sherry.  Patient does have a history of taking oxycodone -acetaminophen  10-325 mg QID.  Patient was seen here yesterday twice and once on 8/15 for the same concern and recommended to come back when he needs an additional dose.  [facility] in [facility]. Denies any other concerns at this time.     Physical Exam   Triage Vital Signs: ED Triage Vitals [06/29/24 1724]  Encounter Vitals Group     BP (!) 153/75     Girls Systolic BP Percentile      Girls Diastolic BP Percentile      Boys Systolic BP Percentile      Boys Diastolic BP Percentile      Pulse Rate 87     Resp 16     Temp 98 F (36.7 C)     Temp Source Oral     SpO2 99 %     Weight 232 lb (105.2 kg)     Height 5' 10 (1.778 m)     Head Circumference      Peak Flow      Pain Score      Pain Loc      Pain Education      Exclude from Growth Chart     Most recent vital signs: Vitals:   06/29/24 1724  BP: (!) 153/75  Pulse: 87  Resp: 16  Temp: 98 F (36.7 C)  SpO2: 99%    General: Awake, in no acute distress. Cooperative. CV: Good peripheral perfusion. No edema. Respiratory: Normal respiratory effort. No respiratory distress. GI: No distention. MSK: Moving all extremities with ease.   ED Results / Procedures / Treatments   Labs (all labs ordered are listed, but only abnormal results are displayed) Labs Reviewed - No data to  display   EKG     RADIOLOGY    PROCEDURES:  Critical Care performed: No   Procedures   MEDICATIONS ORDERED IN ED: Medications  oxyCODONE -acetaminophen  (PERCOCET/ROXICET) 5-325 MG per tablet 2 tablet (has no administration in time range)     IMPRESSION / MDM / ASSESSMENT AND PLAN / ED COURSE  I reviewed the triage vital signs and the nursing notes.                              Differential diagnosis includes, but is not limited to, opioid dependence, medication management for chronic pain, opioid withdrawal  Patient's presentation is most consistent with exacerbation of chronic illness.  Patient is here for dose of his regularly prescribed chronic pain medication oxycodone -acetaminophen  10-325 mg.  [facility] and they are still out of stock of the medication and he is not able to transfer it between pharmacies due to his pain contract.  Reviewed PDMP and patient still has not had the medication refilled since 7/16. He should follow-up with his chronic pain  management doctor; their office is open tomorrow. No acute distress. No other concerns at this time.   The patient may return to the emergency department for any new, worsening, or concerning symptoms. Patient was given the opportunity to ask questions; all questions were answered. Emergency department return precautions were discussed with the patient.  Patient is in agreement to the treatment plan.  Patient is stable for discharge.   FINAL CLINICAL IMPRESSION(S) / ED DIAGNOSES   Final diagnoses:  Opioid dependence with opioid-induced disorder (HCC)     Rx / DC Orders   ED Discharge Orders     None        Note:  This document was prepared using Dragon voice recognition software and may include unintentional dictation errors.     Sheron Salm, PA-C 06/29/24 AVA Suzanne Kirsch, MD 06/29/24 2031

## 2024-06-30 ENCOUNTER — Other Ambulatory Visit: Payer: Self-pay | Admitting: *Deleted

## 2024-06-30 ENCOUNTER — Emergency Department
Admission: EM | Admit: 2024-06-30 | Discharge: 2024-06-30 | Disposition: A | Discharge status: Home / Self Care | Attending: Emergency Medicine | Admitting: Emergency Medicine

## 2024-06-30 ENCOUNTER — Telehealth: Payer: Self-pay | Admitting: Student in an Organized Health Care Education/Training Program

## 2024-06-30 ENCOUNTER — Other Ambulatory Visit: Payer: Self-pay

## 2024-06-30 ENCOUNTER — Telehealth: Payer: Self-pay | Admitting: Nurse Practitioner

## 2024-06-30 DIAGNOSIS — M79641 Pain in right hand: Secondary | ICD-10-CM

## 2024-06-30 DIAGNOSIS — N189 Chronic kidney disease, unspecified: Secondary | ICD-10-CM | POA: Diagnosis not present

## 2024-06-30 DIAGNOSIS — I129 Hypertensive chronic kidney disease with stage 1 through stage 4 chronic kidney disease, or unspecified chronic kidney disease: Secondary | ICD-10-CM | POA: Diagnosis not present

## 2024-06-30 DIAGNOSIS — G5603 Carpal tunnel syndrome, bilateral upper limbs: Secondary | ICD-10-CM

## 2024-06-30 DIAGNOSIS — Z760 Encounter for issue of repeat prescription: Secondary | ICD-10-CM | POA: Diagnosis present

## 2024-06-30 DIAGNOSIS — F1123 Opioid dependence with withdrawal: Secondary | ICD-10-CM | POA: Insufficient documentation

## 2024-06-30 DIAGNOSIS — Z79899 Other long term (current) drug therapy: Secondary | ICD-10-CM

## 2024-06-30 DIAGNOSIS — M1A39X1 Chronic gout due to renal impairment, multiple sites, with tophus (tophi): Secondary | ICD-10-CM

## 2024-06-30 DIAGNOSIS — Z96653 Presence of artificial knee joint, bilateral: Secondary | ICD-10-CM

## 2024-06-30 DIAGNOSIS — G894 Chronic pain syndrome: Secondary | ICD-10-CM

## 2024-06-30 MED ORDER — OXYCODONE-ACETAMINOPHEN 10-325 MG PO TABS: 1.0000 | ORAL_TABLET | Freq: Four times a day (QID) | ORAL | 0 refills | Status: DC | PRN

## 2024-06-30 MED ORDER — OXYCODONE-ACETAMINOPHEN 5-325 MG PO TABS
2.0000 | ORAL_TABLET | Freq: Once | ORAL | Status: AC
  Administered 2024-06-30: 2 via ORAL
  Filled 2024-06-30: qty 2

## 2024-06-30 NOTE — Discharge Instructions (Signed)
 Continue medications as prescribed by the pain clinic.

## 2024-06-30 NOTE — Telephone Encounter (Signed)
 PT came into office about medication change to pharmacy. While speaking to patient he stated that he should been aware of the charges that he will be seeing our PA doctor. PT stated that he will like to see Lateef. PT asked for a copy of his agreement. PT stated that he spoke with someone on Friday medication being out, he went to ER as well. FYI

## 2024-06-30 NOTE — Telephone Encounter (Signed)
 Called the current pharmacy, confirmed that they are out of stock of Oxy/Acet, cancelled Rx for 06-27-24. Rx request sent to S. Patel.

## 2024-06-30 NOTE — ED Provider Notes (Signed)
 [facility] Provider Note    Event Date/Time   First MD Initiated Contact with Patient 06/30/24 1129     (approximate)   History   Pain Management   HPI  Nathan Ramos is a 53 y.o. male   presents to the ED with need for pain medication.  Patient is being seen by Dr. Marcelino at the pain management clinic.  Patient currently has a history of taking oxycodones-acetaminophen  10-325 mg 4 times daily.  He has been able to [facility] as [facility] not have medication in [facility].  He states he was told by his doctor to return to the emergency department every 4-6 hours if medication was needed.  He was last seen in the emergency department on 06/29/2024.  In addition to chronic pain patient has a history of hypertension, DJD, gouty arthritis, CKD, STEMI 2024, chronic use of opioids.      Physical Exam   Triage Vital Signs: ED Triage Vitals  Encounter Vitals Group     BP 06/30/24 1120 (!) 162/88     Girls Systolic BP Percentile --      Girls Diastolic BP Percentile --      Boys Systolic BP Percentile --      Boys Diastolic BP Percentile --      Pulse Rate 06/30/24 1120 100     Resp 06/30/24 1120 18     Temp 06/30/24 1120 97.8 F (36.6 C)     Temp Source 06/30/24 1120 Oral     SpO2 06/30/24 1120 99 %     Weight 06/30/24 1121 231 lb 14.8 oz (105.2 kg)     Height 06/30/24 1121 5' 10 (1.778 m)     Head Circumference --      Peak Flow --      Pain Score 06/30/24 1121 7     Pain Loc --      Pain Education --      Exclude from Growth Chart --     Most recent vital signs: Vitals:   06/30/24 1120  BP: (!) 162/88  Pulse: 100  Resp: 18  Temp: 97.8 F (36.6 C)  SpO2: 99%     General: Awake, no distress.  Talkative, cooperative. CV:  Good peripheral perfusion.  Heart regular rate rhythm. Resp:  Normal effort.  Lungs clear bilaterally. Abd:  No distention.  Other:  Able to stand and ambulate without any assistance.   ED Results /  Procedures / Treatments   Labs (all labs ordered are listed, but only abnormal results are displayed) Labs Reviewed - No data to display    PROCEDURES:  Critical Care performed:   Procedures   MEDICATIONS ORDERED IN ED: Medications  oxyCODONE -acetaminophen  (PERCOCET/ROXICET) 5-325 MG per tablet 2 tablet (2 tablets Oral Given 06/30/24 1202)     IMPRESSION / MDM / ASSESSMENT AND PLAN / ED COURSE  I reviewed the triage vital signs and the nursing notes.   Differential diagnosis includes, but is not limited to, opioid dependence, medication management for chronic pain, opioid withdrawal.  53 year old male presents to the ED for medication management.  Patient currently is a patient at a pain management clinic here at the hospital.  He has been unable to get his oxycodone -acetaminophen  from his pharmacy as they have been out.  He was seen in the emergency department on 8/17, 8/16, 8/15 for pain medication.  While being seen in the emergency department he got a notification from  his pharmacy that his medication would be filled this afternoon.  Patient was given 1 dose while in the emergency department.  He is to follow-up with his pain management doctor and also pick up his pain medication this afternoon as instructed.      Patient's presentation is most consistent with acute, uncomplicated illness.  FINAL CLINICAL IMPRESSION(S) / ED DIAGNOSES   Final diagnoses:  Opioid dependence with withdrawal (HCC)     Rx / DC Orders   ED Discharge Orders     None        Note:  This document was prepared using Dragon voice recognition software and may include unintentional dictation errors.   Saunders Shona CROME, PA-C 06/30/24 1423    Arlander Charleston, MD 06/30/24 [phone]

## 2024-06-30 NOTE — ED Triage Notes (Signed)
 Pt to ED via POV from home. Pt reports chronic pain. Pt seen multiple times for pain medicine refill. Pt reports 4 days without medication and needing refill. Pt reports pain management clinic can't get it right.

## 2024-06-30 NOTE — Telephone Encounter (Signed)
 Patient stated that currently pharmacy is out of the oxycodone . [facility] [place] [facility]. [address] [employer].

## 2024-06-30 NOTE — ED Notes (Signed)
 See triage note  Presents requesting medication refill  States the [facility]  they did not have the medication available  She has been coming here for medication  Last dose was about 16 hours ago He feels shakey

## 2024-07-02 NOTE — ED Provider Notes (Signed)
   [facility] Provider Note    Event Date/Time   First MD Initiated Contact with Patient 06/29/24 0900     (approximate)   History   No chief complaint on file.   HPI  Nathan Ramos is a 53 y.o. male with history of chronic pain syndrome and as listed in EMR presents to the emergency department for pain management.  There is an issue with the prescription that is at his pharmacy.  If anyone else since in prescription for pain medication and he fills it, he breaks his contract with his pain management doctor therefore he is here for a single dose of medication.  He denies any new concerns..     Physical Exam    Vitals:   06/29/24 0834  BP: (!) 160/81  Pulse: 84  Resp: 15  Temp: 98.5 F (36.9 C)  SpO2: 100%    General: Awake, no distress.  CV:  Good peripheral perfusion.  Resp:  Normal effort.  Abd:  No distention.  Other:     ED Results / Procedures / Treatments   Labs (all labs ordered are listed, but only abnormal results are displayed)  Labs Reviewed - No data to display   EKG  Not indicated   RADIOLOGY  Image and radiology report reviewed and interpreted by me. Radiology report consistent with the same.  Not indicated  PROCEDURES:  Critical Care performed: No  Procedures   MEDICATIONS ORDERED IN ED:  Medications  oxyCODONE -acetaminophen  (PERCOCET/ROXICET) 5-325 MG per tablet 2 tablet (2 tablets Oral Given 06/29/24 0931)     IMPRESSION / MDM / ASSESSMENT AND PLAN / ED COURSE   I have reviewed the triage note and vital signs. Vital signs are stable   Differential diagnosis includes, but is not limited to, pain management  Patient's presentation is most consistent with acute, uncomplicated illness.  53 year old male presenting for dose of pain medication as he is unable to get his prescription filled at the pharmacy.  See HPI for further details.  Single dose of medication provided and patient discharged home  in stable condition.      FINAL CLINICAL IMPRESSION(S) / ED DIAGNOSES   Final diagnoses:  Opioid dependence with opioid-induced disorder (HCC)  Other chronic pain     Rx / DC Orders   ED Discharge Orders     None        Note:  This document was prepared using Dragon voice recognition software and may include unintentional dictation errors.   Herlinda Kirk NOVAK, FNP 07/02/24 AMOS Dicky Anes, MD 07/03/24 2007

## 2024-07-23 ENCOUNTER — Ambulatory Visit: Attending: Nurse Practitioner | Admitting: Nurse Practitioner

## 2024-07-23 ENCOUNTER — Encounter: Payer: Self-pay | Admitting: Nurse Practitioner

## 2024-07-23 VITALS — BP 133/74 | HR 88 | Temp 99.0°F | Resp 16 | Ht 70.0 in | Wt 231.0 lb

## 2024-07-23 DIAGNOSIS — M1A39X1 Chronic gout due to renal impairment, multiple sites, with tophus (tophi): Secondary | ICD-10-CM | POA: Diagnosis not present

## 2024-07-23 DIAGNOSIS — M79641 Pain in right hand: Secondary | ICD-10-CM | POA: Diagnosis not present

## 2024-07-23 DIAGNOSIS — G894 Chronic pain syndrome: Secondary | ICD-10-CM | POA: Diagnosis not present

## 2024-07-23 DIAGNOSIS — Z96653 Presence of artificial knee joint, bilateral: Secondary | ICD-10-CM | POA: Diagnosis not present

## 2024-07-23 DIAGNOSIS — G5603 Carpal tunnel syndrome, bilateral upper limbs: Secondary | ICD-10-CM | POA: Diagnosis not present

## 2024-07-23 DIAGNOSIS — Z79899 Other long term (current) drug therapy: Secondary | ICD-10-CM | POA: Diagnosis not present

## 2024-07-23 MED ORDER — OXYCODONE-ACETAMINOPHEN 10-325 MG PO TABS: 1.0000 | ORAL_TABLET | Freq: Four times a day (QID) | ORAL | 0 refills | Status: DC | PRN

## 2024-07-23 MED ORDER — OXYCODONE-ACETAMINOPHEN 10-325 MG PO TABS: 1.0000 | ORAL_TABLET | Freq: Four times a day (QID) | ORAL | 0 refills | Status: AC | PRN

## 2024-07-23 NOTE — Progress Notes (Signed)
 PROVIDER NOTE: Interpretation of information contained herein should be left to medically-trained personnel. Specific patient instructions are provided elsewhere under Patient Instructions section of medical record. This document was created in part using AI and STT-dictation technology, any transcriptional errors that may result from this process are unintentional.  Patient: Nathan Ramos  Service: E/M   PCP: [facility]  DOB: [birth year]  DOS: 07/23/2024  Provider: Emmy MARLA Blanch, NP  MRN: [id number]  Delivery: Face-to-face  Specialty: Interventional Pain Management  Type: Established Patient  Setting: Ambulatory outpatient facility  Specialty designation: 09  Referring Prov.: [facility]  Location: Outpatient office facility       History of present illness (HPI) Nathan Ramos, a 53 y.o. year old male, is here today because of his Medication management [Z79.899]. Nathan Ramos primary complain today is Pain  Pertinent problems: Nathan Ramos has Medullary cystic disease of the kidney; Chronic pain; Gout due to renal impairment; Primary localized osteoarthritis of right knee; Chronic pain syndrome; and Opioid use on their pertinent problem list.   Pain Assessment: Severity of Chronic pain is reported as a 6 /10. Location: Other (Comment)  / . Onset: More than a month ago. Quality: Sharp. Timing: Constant. Modifying factor(s): rest, medications. Vitals:  height is 5' 10 (1.778 m) and weight is 231 lb (104.8 kg). His temporal temperature is 99 F (37.2 C). His blood pressure is 133/74 and his pulse is 88. His respiration is 16 and oxygen saturation is 99%.  BMI: Estimated body mass index is 33.15 kg/m as calculated from the following:   Height as of this encounter: 5' 10 (1.778 m).   Weight as of this encounter: 231 lb (104.8 kg).  Last encounter: 04/22/2024. Last procedure: Visit date not found.  Reason for encounter: medication management. No change in medical  history since last visit.  Patient's pain is at baseline.  Patient continues multimodal pain regimen as prescribed.  States that it provides pain relief and improvement in functional status.   Pharmacotherapy Assessment   Analgesic: Oxycodone -acetaminophen  (Percocet) 10-325 mg tablet every 6 hours as needed for pain. MME=60 Monitoring: [place] PMP: PDMP reviewed during this encounter.       Pharmacotherapy: No side-effects or adverse reactions reported. Compliance: No problems identified. Effectiveness: Clinically acceptable.  Nathan Reda LITTIE, RN  07/23/2024 10:47 AM  Sign when Signing Visit Nursing Pain Medication Assessment:  Safety precautions to be maintained throughout the outpatient stay will include: orient to surroundings, keep bed in low position, maintain call bell within reach at all times, provide assistance with transfer out of bed and ambulation.  Medication Inspection Compliance: Pill count conducted under aseptic conditions, in front of the patient. Neither the pills nor the bottle was removed from the patient's sight at any time. Once count was completed pills were immediately returned to the patient in their original bottle.  Medication: Oxycodone /APAP Pill/Patch Count: 27 of 120 pills/patches remain Pill/Patch Appearance: Markings consistent with prescribed medication Bottle Appearance: Standard pharmacy container. Clearly labeled. Filled Date: 08 / 18 / 2025 Last Medication intake:  TodaySafety precautions to be maintained throughout the outpatient stay will include: orient to surroundings, keep bed in low position, maintain call bell within reach at all times, provide assistance with transfer out of bed and ambulation.     UDS:  Summary  Date Value Ref Range Status  10/30/2023 FINAL  Final    Comment:    ==================================================================== [employer] 13 (MW) ==================================================================== Test  Result       Flag       Units  Drug Present and Declared for Prescription Verification   Oxycodone                       1110         EXPECTED   ng/mg creat   Oxymorphone                    4889         EXPECTED   ng/mg creat   Noroxycodone                   1582         EXPECTED   ng/mg creat   Noroxymorphone                 853          EXPECTED   ng/mg creat    Sources of oxycodone  are scheduled prescription medications.    Oxymorphone, noroxycodone, and noroxymorphone are expected    metabolites of oxycodone . Oxymorphone is also available as a    scheduled prescription medication.  ==================================================================== Test                      Result    Flag   Units      Ref Range   Creatinine              62               mg/dL      >=79 ==================================================================== Declared Medications:  The flagging and interpretation on this report are based on the  following declared medications.  Unexpected results may arise from  inaccuracies in the declared medications.   **Note: The testing scope of this panel includes these medications:   Oxycodone  (Percocet)   **Note: The testing scope of this panel does not include the  following reported medications:   Acetaminophen  (Percocet)  Amlodipine  (Norvasc )  Aspirin   Febuxostat   Irbesartan  (Avapro )  Metoprolol  (Lopressor )  Naloxone  (Narcan )  Rosuvastatin  (Crestor ) ==================================================================== For clinical consultation, please call [phone]. ====================================================================     No results found for: CBDTHCR No results found for: D8THCCBX No results found for: D9THCCBX  ROS  Constitutional: Denies any fever or chills Gastrointestinal: No reported hemesis, hematochezia, vomiting, or acute GI distress Musculoskeletal: Diffuse arthralgias and  myalgias  Neurological: No reported episodes of acute onset apraxia, aphasia, dysarthria, agnosia, amnesia, paralysis, loss of coordination, or loss of consciousness  Medication Review  Febuxostat , amLODipine , aspirin , atorvastatin , irbesartan , metoprolol  tartrate, naloxone , oxyCODONE -acetaminophen , and rosuvastatin   History Review  Allergy: Nathan Ramos is allergic to nsaids. Drug: Nathan Ramos  reports current drug use. Alcohol:  reports no history of alcohol use. Tobacco:  reports that he has quit smoking. His smoking use included cigarettes. He has a 11 pack-year smoking history. He has never used smokeless tobacco. Social: Nathan Ramos  reports that he has quit smoking. His smoking use included cigarettes. He has a 11 pack-year smoking history. He has never used smokeless tobacco. He reports current drug use. He reports that he does not drink alcohol. Medical:  has a past medical history of Anemia, Bone spur of other site, Chronic pain, CKD (chronic kidney disease), Controlled substance agreement signed, Gout, Gouty arthritis, History of YAG laser capsulotomy of lens of left eye, Hyperlipidemia, Hypertension, IFG (impaired fasting glucose), Left knee DJD, Medullary cystic disease of  the kidney, Primary localized osteoarthritis of right knee, and Smoker. Surgical: Nathan Ramos  has a past surgical history that includes Carpal tunnel release (Left); Umbilical hernia repair (2010); Cataract extraction w/ intraocular lens implant (Left, 2008); Cataract extraction w/ intraocular lens implant (Right, 2008); Hernia repair; Total knee arthroplasty (Left, 03/30/2014); Eye surgery; Total knee arthroplasty (Right, 10/26/2014); Joint replacement (Left, 03/2014); Coronary/Graft Acute MI Revascularization (N/A, 04/12/2023); and LEFT HEART CATH AND CORONARY ANGIOGRAPHY (N/A, 04/12/2023). Family: family history includes COPD in his maternal grandmother and mother; Kidney disease in his mother; Stroke (age of onset: 37) in his  father; Thyroid  disease in his mother.  Laboratory Chemistry Profile   Renal Lab Results  Component Value Date   BUN 56 (H) 04/14/2023   CREATININE 3.77 (H) 04/14/2023   BCR 15 06/01/2020   GFRAA 27 (L) 06/01/2020   GFRNONAA 19 (L) 04/14/2023    Hepatic Lab Results  Component Value Date   AST 70 (H) 04/12/2023   ALT 20 04/12/2023   ALBUMIN 4.1 04/12/2023   ALKPHOS 122 04/12/2023    Electrolytes Lab Results  Component Value Date   NA 136 04/14/2023   K 5.3 (H) 04/14/2023   CL 109 04/14/2023   CALCIUM  9.0 04/14/2023    Bone No results found for: VD25OH, VD125OH2TOT, CI6874NY7, CI7874NY7, 25OHVITD1, 25OHVITD2, 25OHVITD3, TESTOFREE, TESTOSTERONE  Inflammation (CRP: Acute Phase) (ESR: Chronic Phase) No results found for: CRP, ESRSEDRATE, LATICACIDVEN       Note: Above Lab results reviewed.  Recent Imaging Review  CARDIAC CATHETERIZATION   RPAV lesion is 100% stenosed.   Mid LAD lesion is 50% stenosed.   2nd Diag lesion is 50% stenosed.   A stent was successfully placed.   Post intervention, there is a 0% residual stenosis.   There is mild left ventricular systolic dysfunction.   LV end diastolic pressure is normal.   The left ventricular ejection fraction is 45-50% by visual estimate.   There is no mitral valve regurgitation.  Conclusion  STEMI presentation inferior wall Left ventricular function mildly depressed with inferior hypokinesis  ejection fraction around 45 to 50%  Coronaries Left main large minor irregularities LAD large 50% mid LAD bifurcation with diagonal 50% Circumflex large minor irregularities RCA large 100% occluded PL TIMI 0 flow IRA  Intervention Successful PCI and stent of the PL 3.0 x 15 mm frontier Onyx to 14 atm  lesion reduced from 100 down to 0% TIMI-3 flow restored from TIMI 0  No complications Patient tolerated procedure well Recommend aspirin  Brilinta  for 12 months Patient transferred to ICU  postprocedure Note: Reviewed        Physical Exam  Vitals: BP 133/74 (Cuff Size: Large)   Pulse 88   Temp 99 F (37.2 C) (Temporal)   Resp 16   Ht 5' 10 (1.778 m)   Wt 231 lb (104.8 kg)   SpO2 99%   BMI 33.15 kg/m  BMI: Estimated body mass index is 33.15 kg/m as calculated from the following:   Height as of this encounter: 5' 10 (1.778 m).   Weight as of this encounter: 231 lb (104.8 kg). Ideal: Ideal body weight: 73 kg (160 lb 15 oz) Adjusted ideal body weight: 85.7 kg (188 lb 15.4 oz) General appearance: Well nourished, well developed, and well hydrated. In no apparent acute distress Mental status: Alert, oriented x 3 (person, place, & time)       Respiratory: No evidence of acute respiratory distress Eyes: PERLA   Assessment   Diagnosis Status  1. Medication management   2. Chronic pain syndrome   3. History of bilateral knee replacement   4. Bilateral carpal tunnel syndrome   5. 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 Right hand pain   7. Controlled substance agreement signed    Controlled Controlled Controlled   Updated Problems: No problems updated.  Plan of Care  Problem-specific:  Assessment and Plan  We will continue on current medication regimen.  Prescribing drug monitoring (PDMP) reviewed; findings consistent with the use of prescribed medication and no evidence of narcotic misuse or abuse.  UDS up-to-date.  Schedule follow-up in 90 days for medication management.  No other new issues or problems reported to this visit.   Nathan Ramos has a current medication list which includes the following long-term medication(s): amlodipine , atorvastatin , irbesartan , and metoprolol  tartrate.  Pharmacotherapy (Medications Ordered): Meds ordered this encounter  Medications   oxyCODONE -acetaminophen  (PERCOCET) 10-325 MG tablet    Sig: Take 1 tablet by mouth every 6 (six) hours as needed for pain.    Dispense:  120 tablet     Refill:  0   oxyCODONE -acetaminophen  (PERCOCET) 10-325 MG tablet    Sig: Take 1 tablet by mouth every 6 (six) hours as needed for pain.    Dispense:  120 tablet    Refill:  0   oxyCODONE -acetaminophen  (PERCOCET) 10-325 MG tablet    Sig: Take 1 tablet by mouth every 6 (six) hours as needed for pain.    Dispense:  120 tablet    Refill:  0   Orders:  No orders of the defined types were placed in this encounter.       Return in about 3 months (around 10/22/2024) for (F2F), (MM), Emmy Blanch NP.    Recent Visits No visits were found meeting these conditions. Showing recent visits within past 90 days and meeting all other requirements Today's Visits Date Type Provider Dept  07/23/24 Office Visit Aldeen Riga K, NP [facility]  Showing today's visits and meeting all other requirements Future Appointments Date Type Provider Dept  10/16/24 Appointment Kelsi Benham K, NP [facility]  Showing future appointments within next 90 days and meeting all other requirements  I discussed the assessment and treatment plan with the patient. The patient was provided an opportunity to ask questions and all were answered. The patient agreed with the plan and demonstrated an understanding of the instructions.  Patient advised to call back or seek an in-person evaluation if the symptoms or condition worsens.  Duration of encounter: 30 minutes.  Total time on encounter, as per AMA guidelines included both the face-to-face and non-face-to-face time personally spent by the physician and/or other qualified health care professional(s) on the day of the encounter (includes time in activities that require the physician or other qualified health care professional and does not include time in activities normally performed by clinical staff). Physician's time may include the following activities when performed: Preparing to see the patient (e.g., pre-charting review of records, searching for  previously ordered imaging, lab work, and nerve conduction tests) Review of prior analgesic pharmacotherapies. Reviewing PMP Interpreting ordered tests (e.g., lab work, imaging, nerve conduction tests) Performing post-procedure evaluations, including interpretation of diagnostic procedures Obtaining and/or reviewing separately obtained history Performing a medically appropriate examination and/or evaluation Counseling and educating the patient/family/caregiver Ordering medications, tests, or procedures Referring and communicating with other health care professionals (when not separately reported) Documenting clinical information in the  electronic or other health record Independently interpreting results (not separately reported) and communicating results to the patient/ family/caregiver Care coordination (not separately reported)  Note by: Iman Reinertsen K Rahkeem Senft, NP (TTS and AI technology used. I apologize for any typographical errors that were not detected and corrected.) Date: 07/23/2024; Time: 12:02 PM

## 2024-07-23 NOTE — Progress Notes (Signed)
 Nursing Pain Medication Assessment:  Safety precautions to be maintained throughout the outpatient stay will include: orient to surroundings, keep bed in low position, maintain call bell within reach at all times, provide assistance with transfer out of bed and ambulation.  Medication Inspection Compliance: Pill count conducted under aseptic conditions, in front of the patient. Neither the pills nor the bottle was removed from the patient's sight at any time. Once count was completed pills were immediately returned to the patient in their original bottle.  Medication: Oxycodone /APAP Pill/Patch Count: 27 of 120 pills/patches remain Pill/Patch Appearance: Markings consistent with prescribed medication Bottle Appearance: Standard pharmacy container. Clearly labeled. Filled Date: 08 / 18 / 2025 Last Medication intake:  TodaySafety precautions to be maintained throughout the outpatient stay will include: orient to surroundings, keep bed in low position, maintain call bell within reach at all times, provide assistance with transfer out of bed and ambulation.

## 2024-07-31 DIAGNOSIS — Z01818 Encounter for other preprocedural examination: Secondary | ICD-10-CM | POA: Diagnosis not present

## 2024-07-31 DIAGNOSIS — Z125 Encounter for screening for malignant neoplasm of prostate: Secondary | ICD-10-CM | POA: Diagnosis not present

## 2024-07-31 DIAGNOSIS — Z1159 Encounter for screening for other viral diseases: Secondary | ICD-10-CM | POA: Diagnosis not present

## 2024-07-31 DIAGNOSIS — Z114 Encounter for screening for human immunodeficiency virus [HIV]: Secondary | ICD-10-CM | POA: Diagnosis not present

## 2024-08-25 DIAGNOSIS — Z01810 Encounter for preprocedural cardiovascular examination: Secondary | ICD-10-CM | POA: Diagnosis not present

## 2024-08-25 DIAGNOSIS — I159 Secondary hypertension, unspecified: Secondary | ICD-10-CM | POA: Diagnosis not present

## 2024-08-29 DIAGNOSIS — Z1211 Encounter for screening for malignant neoplasm of colon: Secondary | ICD-10-CM | POA: Diagnosis not present

## 2024-08-29 DIAGNOSIS — E669 Obesity, unspecified: Secondary | ICD-10-CM | POA: Diagnosis not present

## 2024-08-29 DIAGNOSIS — Z01818 Encounter for other preprocedural examination: Secondary | ICD-10-CM | POA: Diagnosis not present

## 2024-09-16 ENCOUNTER — Encounter: Payer: Self-pay | Admitting: Internal Medicine

## 2024-09-18 ENCOUNTER — Other Ambulatory Visit: Payer: Self-pay | Admitting: Cardiology

## 2024-09-18 DIAGNOSIS — N184 Chronic kidney disease, stage 4 (severe): Secondary | ICD-10-CM

## 2024-09-18 DIAGNOSIS — E781 Pure hyperglyceridemia: Secondary | ICD-10-CM

## 2024-09-18 DIAGNOSIS — I2111 ST elevation (STEMI) myocardial infarction involving right coronary artery: Secondary | ICD-10-CM

## 2024-09-18 DIAGNOSIS — I340 Nonrheumatic mitral (valve) insufficiency: Secondary | ICD-10-CM

## 2024-09-18 DIAGNOSIS — I151 Hypertension secondary to other renal disorders: Secondary | ICD-10-CM

## 2024-09-18 DIAGNOSIS — I5189 Other ill-defined heart diseases: Secondary | ICD-10-CM

## 2024-09-18 DIAGNOSIS — I2510 Atherosclerotic heart disease of native coronary artery without angina pectoris: Secondary | ICD-10-CM

## 2024-09-24 ENCOUNTER — Ambulatory Visit: Admitting: Certified Registered Nurse Anesthetist

## 2024-09-24 ENCOUNTER — Encounter: Payer: Self-pay | Admitting: Internal Medicine

## 2024-09-24 ENCOUNTER — Ambulatory Visit
Admission: RE | Admit: 2024-09-24 | Discharge: 2024-09-24 | Disposition: A | Discharge status: Home / Self Care | Attending: Internal Medicine | Admitting: Internal Medicine

## 2024-09-24 ENCOUNTER — Encounter
Admission: RE | Disposition: A | Discharge status: Home / Self Care | Payer: Self-pay | Source: Home / Self Care | Attending: Internal Medicine

## 2024-09-24 DIAGNOSIS — I2510 Atherosclerotic heart disease of native coronary artery without angina pectoris: Secondary | ICD-10-CM | POA: Diagnosis not present

## 2024-09-24 DIAGNOSIS — I130 Hypertensive heart and chronic kidney disease with heart failure and stage 1 through stage 4 chronic kidney disease, or unspecified chronic kidney disease: Secondary | ICD-10-CM | POA: Diagnosis not present

## 2024-09-24 DIAGNOSIS — I5030 Unspecified diastolic (congestive) heart failure: Secondary | ICD-10-CM | POA: Diagnosis not present

## 2024-09-24 DIAGNOSIS — I10 Essential (primary) hypertension: Secondary | ICD-10-CM | POA: Insufficient documentation

## 2024-09-24 DIAGNOSIS — D649 Anemia, unspecified: Secondary | ICD-10-CM | POA: Insufficient documentation

## 2024-09-24 DIAGNOSIS — N186 End stage renal disease: Secondary | ICD-10-CM | POA: Insufficient documentation

## 2024-09-24 DIAGNOSIS — Z1211 Encounter for screening for malignant neoplasm of colon: Secondary | ICD-10-CM | POA: Diagnosis not present

## 2024-09-24 DIAGNOSIS — K5730 Diverticulosis of large intestine without perforation or abscess without bleeding: Secondary | ICD-10-CM | POA: Insufficient documentation

## 2024-09-24 DIAGNOSIS — I252 Old myocardial infarction: Secondary | ICD-10-CM | POA: Diagnosis not present

## 2024-09-24 DIAGNOSIS — Z6831 Body mass index (BMI) 31.0-31.9, adult: Secondary | ICD-10-CM | POA: Insufficient documentation

## 2024-09-24 DIAGNOSIS — I340 Nonrheumatic mitral (valve) insufficiency: Secondary | ICD-10-CM | POA: Diagnosis not present

## 2024-09-24 DIAGNOSIS — Z87891 Personal history of nicotine dependence: Secondary | ICD-10-CM | POA: Diagnosis not present

## 2024-09-24 DIAGNOSIS — M1990 Unspecified osteoarthritis, unspecified site: Secondary | ICD-10-CM | POA: Insufficient documentation

## 2024-09-24 DIAGNOSIS — K635 Polyp of colon: Secondary | ICD-10-CM | POA: Insufficient documentation

## 2024-09-24 DIAGNOSIS — N184 Chronic kidney disease, stage 4 (severe): Secondary | ICD-10-CM | POA: Diagnosis not present

## 2024-09-24 DIAGNOSIS — J449 Chronic obstructive pulmonary disease, unspecified: Secondary | ICD-10-CM | POA: Diagnosis not present

## 2024-09-24 DIAGNOSIS — K621 Rectal polyp: Secondary | ICD-10-CM | POA: Diagnosis not present

## 2024-09-24 DIAGNOSIS — F17210 Nicotine dependence, cigarettes, uncomplicated: Secondary | ICD-10-CM | POA: Diagnosis not present

## 2024-09-24 DIAGNOSIS — E66813 Obesity, class 3: Secondary | ICD-10-CM | POA: Diagnosis not present

## 2024-09-24 DIAGNOSIS — K640 First degree hemorrhoids: Secondary | ICD-10-CM | POA: Insufficient documentation

## 2024-09-24 DIAGNOSIS — K648 Other hemorrhoids: Secondary | ICD-10-CM | POA: Diagnosis not present

## 2024-09-24 HISTORY — PX: POLYPECTOMY: SHX149

## 2024-09-24 HISTORY — DX: Chronic gout, unspecified, with tophus (tophi): M1A.9XX1

## 2024-09-24 HISTORY — PX: COLONOSCOPY: SHX5424

## 2024-09-24 SURGERY — COLONOSCOPY
Anesthesia: General

## 2024-09-24 MED ORDER — PROPOFOL 10 MG/ML IV BOLUS
INTRAVENOUS | Status: DC | PRN
  Administered 2024-09-24: 150 ug/kg/min via INTRAVENOUS
  Administered 2024-09-24: 100 mg via INTRAVENOUS

## 2024-09-24 MED ORDER — SODIUM CHLORIDE 0.9 % IV SOLN: INTRAVENOUS | Status: DC

## 2024-09-24 MED ORDER — DEXMEDETOMIDINE HCL IN NACL 80 MCG/20ML IV SOLN
INTRAVENOUS | Status: DC | PRN
Start: 2024-09-24 — End: 2024-09-24
  Administered 2024-09-24: 12 ug via INTRAVENOUS

## 2024-09-24 MED ORDER — LIDOCAINE HCL (CARDIAC) PF 100 MG/5ML IV SOSY
PREFILLED_SYRINGE | INTRAVENOUS | Status: DC | PRN
  Administered 2024-09-24: 50 mg via INTRAVENOUS

## 2024-09-24 NOTE — Op Note (Signed)
 [facility] Gastroenterology Patient Name: Nathan Ramos Procedure Date: 09/24/2024 10:49 AM MRN: [id number] Account #: [id number] Date of Birth: 07-May-1971 Admit Type: Outpatient Age: 53 Room: [facility] ENDO ROOM 2 Gender: Male Note Status: Finalized Instrument Name: Colon Scope [id number] Procedure:             Colonoscopy Indications:           Screening for colorectal malignant neoplasm Providers:             Shamal Stracener K. Aundria MD, MD Referring MD:          [facility] (Referring MD) Medicines:             Propofol  per Anesthesia Complications:         No immediate complications. Estimated blood loss:                         Minimal. Procedure:             Pre-Anesthesia Assessment:                        - Prior to the procedure, a History and Physical was                         performed, and patient medications, allergies and                         sensitivities were reviewed. The patient's tolerance                         of previous anesthesia was reviewed.                        - The risks and benefits of the procedure and the                         sedation options and risks were discussed with the                         patient. All questions were answered and informed                         consent was obtained.                        - Patient identification and proposed procedure were                         verified prior to the procedure by the nurse. The                         procedure was verified in the procedure room.                        - ASA Grade Assessment: III - A patient with severe                         systemic disease.                        -  After reviewing the risks and benefits, the patient                         was deemed in satisfactory condition to undergo the                         procedure.                        After obtaining informed consent, the colonoscope was                         passed  under direct vision. Throughout the procedure,                         the patient's blood pressure, pulse, and oxygen                         saturations were monitored continuously. The                         Colonoscope was introduced through the anus and                         advanced to the the cecum, identified by appendiceal                         orifice and ileocecal valve. The colonoscopy was                         performed without difficulty. The patient tolerated                         the procedure well. The quality of the bowel                         preparation was adequate. The ileocecal valve,                         appendiceal orifice, and rectum were photographed. Findings:      The perianal and digital rectal examinations were normal. Pertinent       negatives include normal sphincter tone and no palpable rectal lesions.      Non-bleeding internal hemorrhoids were found during retroflexion. The       hemorrhoids were Grade I (internal hemorrhoids that do not prolapse).      Two sessile polyps were found in the rectum and sigmoid colon. The       polyps were 6 to 8 mm in size. These polyps were removed with a cold       snare. Resection and retrieval were complete. Estimated blood loss was       minimal.      Many small-mouthed diverticula were found in the sigmoid colon.      The exam was otherwise without abnormality. Impression:            - Non-bleeding internal hemorrhoids.                        - Two 6 to 8 mm polyps in the rectum and in  the                         sigmoid colon, removed with a cold snare. Resected and                         retrieved.                        - Diverticulosis in the sigmoid colon.                        - The examination was otherwise normal. Recommendation:        - Patient has a contact number available for                         emergencies. The signs and symptoms of potential                         delayed  complications were discussed with the patient.                         Return to normal activities tomorrow. Written                         discharge instructions were provided to the patient.                        - Resume previous diet.                        - Continue present medications.                        - Repeat colonoscopy is recommended for surveillance.                         The colonoscopy date will be determined after                         pathology results from today's exam become available                         for review.                        - Return to GI office PRN.                        - The findings and recommendations were discussed with                         the patient. Procedure Code(s):     --- Professional ---                        [phone], Colonoscopy, flexible; with removal of                         tumor(s), polyp(s), or other lesion(s) by snare  technique Diagnosis Code(s):     --- Professional ---                        K57.30, Diverticulosis of large intestine without                         perforation or abscess without bleeding                        K64.0, First degree hemorrhoids                        D12.8, Benign neoplasm of rectum                        D12.5, Benign neoplasm of sigmoid colon                        Z12.11, Encounter for screening for malignant neoplasm                         of colon CPT copyright 2022 [employer]. All rights reserved. The codes documented in this report are preliminary and upon coder review may  be revised to meet current compliance requirements. Nathan MARLA Boss MD, MD 09/24/2024 12:04:47 PM This report has been signed electronically. Number of Addenda: 0 Note Initiated On: 09/24/2024 10:49 AM Scope Withdrawal Time: 0 hours 7 minutes 47 seconds  Total Procedure Duration: 0 hours 11 minutes 52 seconds  Estimated Blood Loss:  Estimated blood loss was  minimal.      [facility]

## 2024-09-24 NOTE — Anesthesia Preprocedure Evaluation (Addendum)
 Anesthesia Evaluation  Patient identified by MRN, date of birth, ID band Patient awake    Reviewed: Allergy & Precautions, NPO status , Patient's Chart, lab work & pertinent test results  Airway Mallampati: III  TM Distance: >3 FB Neck ROM: Full    Dental  (+) Poor Dentition   Pulmonary COPD, Patient abstained from smoking., former smoker   Pulmonary exam normal  + decreased breath sounds      Cardiovascular hypertension, Pt. on medications + CAD and + Past MI  Normal cardiovascular exam Rhythm:Regular Rate:Normal     Neuro/Psych negative neurological ROS  negative psych ROS   GI/Hepatic negative GI ROS,,,(+)     substance abuse    Endo/Other  negative endocrine ROS  Class 3 obesity  Renal/GU CRFRenal disease  negative genitourinary   Musculoskeletal  (+) Arthritis ,    Abdominal  (+) + obese  Peds negative pediatric ROS (+)  Hematology negative hematology ROS (+) Blood dyscrasia, anemia   Anesthesia Other Findings Past Medical History: No date: Anemia     Comment:  of chronic renal disease No date: Bone spur of other site     Comment:  right shoulder, managed by ortho No date: Chronic pain     Comment:  Destry Waddell CFNP at [facility] 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 Dr Saralee Stank No date: Primary localized osteoarthritis of right knee No date: Smoker  Past Surgical History: No date: CARDIAC  CATHETERIZATION     Comment:  stent No date: CARPAL TUNNEL RELEASE; Left 11/13/2006: CATARACT EXTRACTION W/ INTRAOCULAR LENS IMPLANT; Left 11/13/2006: CATARACT EXTRACTION W/ INTRAOCULAR LENS IMPLANT; Right 04/12/2023: CORONARY/GRAFT ACUTE MI REVASCULARIZATION; N/A     Comment:  Procedure: Coronary/Graft Acute MI Revascularization;                Surgeon: Florencio Cara BIRCH, MD;  Location: [facility] INVASIVE              CV LAB;  Service: Cardiovascular;  Laterality: N/A; No date: EYE SURGERY No date: HERNIA REPAIR 03/13/2014: JOINT REPLACEMENT; Left 04/12/2023: LEFT HEART CATH AND CORONARY ANGIOGRAPHY; N/A     Comment:  Procedure: LEFT HEART CATH AND CORONARY ANGIOGRAPHY;                Surgeon: Florencio Cara BIRCH, MD;  Location: [facility] INVASIVE              CV LAB;  Service: Cardiovascular;  Laterality: N/A; 03/30/2014: TOTAL KNEE ARTHROPLASTY; Left     Comment:  Procedure: TOTAL KNEE ARTHROPLASTY;  Surgeon: Lamar DELENA Millman, MD;  Location: [facility] OR;  Service: Orthopedics;                Laterality: Left; 10/26/2014: TOTAL KNEE ARTHROPLASTY; Right     Comment:  Procedure: TOTAL KNEE ARTHROPLASTY;  Surgeon: Lamar DELENA  Jane, MD;  Location: [facility] OR;  Service: Orthopedics;                Laterality: Right; 11/13/2008: UMBILICAL HERNIA REPAIR  BMI    Body Mass Index: 31.71 kg/m      Reproductive/Obstetrics negative OB ROS                              Anesthesia Physical Anesthesia Plan  ASA: 3  Anesthesia Plan: General   Post-op Pain Management:    Induction: Intravenous  PONV Risk Score and Plan: Propofol  infusion and TIVA  Airway Management Planned: Natural Airway and Nasal Cannula  Additional Equipment:   Intra-op Plan:   Post-operative Plan:   Informed Consent: I have reviewed the patients History and Physical, chart, labs and discussed the procedure including the risks, benefits and alternatives for the proposed  anesthesia with the patient or authorized representative who has indicated his/her understanding and acceptance.     Dental Advisory Given  Plan Discussed with: CRNA  Anesthesia Plan Comments:          Anesthesia Quick Evaluation

## 2024-09-24 NOTE — H&P (Signed)
 Outpatient short stay form Pre-procedure 09/24/2024 11:39 AM Nathan Ramos, M.D.  Primary Physician: Donnice Ramos, M.D.  Reason for visit:  Colon cancer screening  History of present illness:  Nathan Ramos presents to the [facility] at the request of Nathan Ramos for chief complaint of routine-risk colon cancer screening. He presents to the clinic with his oldest daughter - Nathan Ramos. 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 He is not yet on dialysis. He was told he needed to have a colonoscopy prior to being considered for transplant. He is colonoscopy naive. He denies any known family history of colorectal cancer, advanced adenomas, or IBD. He denies any acute GI complaints or concerns at this time. He is typically having a daily BM. He denies any issues with hematochezia, melena, fecal urgency, or fecal incontinence. He denies any abdominal pain. He does follow with Pain Medicine for chronic pain syndrome on Percocet 10 mg every 6 hours. He reports he has been on chronic narcotics for 15+ years. He denies any UGI symptoms such as heartburn, acid reflux, esophageal dysphagia, odynophagia, early satiety, hoarseness, or epigastric abdominal pain. He was hospitalized with STEMI back in May 2024 and is s/p PCI/DES. He reports he takes baby aspirin  81 mg every other day currently. He had some issues with bleeding on Brilinta  so this was stopped. He has some questions about the bowel prep process, but otherwise no other concerns or questions at this time. He is working on losing weight by making healthier dietary options.     Current Facility-Administered Medications:    0.9 %  sodium chloride  infusion, , Intravenous, Continuous, Nathan Ramos, Nathan Blough K, MD, Last Rate: 20 mL/hr at 09/24/24 1052, Continued from Pre-op at 09/24/24 1052  Medications Prior to Admission  Medication Sig Dispense Refill Last Dose/Taking    amLODipine  (NORVASC ) 10 MG tablet TAKE 1 TABLET BY MOUTH EVERY DAY 90 tablet 0 09/24/2024 Morning   Febuxostat  80 MG TABS Take 1 tablet by mouth every other day.    09/23/2024   oxyCODONE -acetaminophen  (PERCOCET) 10-325 MG tablet Take 1 tablet by mouth every 6 (six) hours as needed for pain. 120 tablet 0 09/23/2024   aspirin  81 MG chewable tablet Chew 1 tablet (81 mg total) by mouth daily. (Patient not taking: Reported on 09/24/2024) 30 tablet 11 Not Taking   atorvastatin  (LIPITOR) 80 MG tablet TAKE 1 TABLET BY MOUTH EVERY DAY 90 tablet 1    irbesartan  (AVAPRO ) 150 MG tablet TAKE 1 TABLET BY MOUTH EVERY DAY (Patient not taking: Reported on 09/24/2024) 90 tablet 1 Not Taking   metoprolol  tartrate (LOPRESSOR ) 25 MG tablet TAKE 1 TABLET BY MOUTH TWICE A DAY (Patient not taking: Reported on 09/24/2024) 180 tablet 1 Not Taking   naloxone  (NARCAN ) nasal spray 4 mg/0.1 mL Use in case of accidental overdose with narcotics / opioids 1 each 1    [START ON 09/28/2024] oxyCODONE -acetaminophen  (PERCOCET) 10-325 MG tablet Take 1 tablet by mouth every 6 (six) hours as needed for pain. 120 tablet 0    rosuvastatin  (CRESTOR ) 20 MG tablet Take 20 mg by mouth daily. (Patient not taking: Reported on 09/24/2024)   Not Taking     Allergies  Allergen Reactions   Nsaids Other (See Comments)    impaired renal function     Past Medical History:  Diagnosis Date   Anemia    of chronic renal disease   Bone spur of other site  right shoulder, managed by ortho   Chronic pain    N[facility] Ramos CFNP at [facility]   Chronic tophaceous gout    CKD (chronic kidney disease)    stage III 03/2014 ([facility])   Controlled substance agreement signed    signed 06/02/15   Gout    Gouty arthritis    knee, managed by Ortho   History of YAG laser capsulotomy of lens of left eye    Hyperlipidemia    Hypertension    IFG (impaired fasting glucose)    Left knee DJD    Medullary cystic disease of the  kidney    congenital Dr Saralee Ramos   Primary localized osteoarthritis of right knee    Smoker     Review of systems:  Otherwise negative.    Physical Exam  Gen: Alert, oriented. Appears stated age.  HEENT: Minor/AT. PERRLA. Lungs: CTA, no wheezes. CV: RR nl S1, S2. Abd: soft, benign, no masses. BS+ Ext: No edema. Pulses 2+    Planned procedures: Proceed with colonoscopy. The patient understands the nature of the planned procedure, indications, risks, alternatives and potential complications including but not limited to bleeding, infection, perforation, damage to internal organs and possible oversedation/side effects from anesthesia. The patient agrees and gives consent to proceed.  Please refer to procedure notes for findings, recommendations and patient disposition/instructions.     N[facility] Ramos Ramos. Ramos, M.D. Gastroenterology 09/24/2024  11:39 AM

## 2024-09-24 NOTE — Anesthesia Procedure Notes (Signed)
 Date/Time: 09/24/2024 11:47 AM  Performed by: Dominica Krabbe, CRNAPre-anesthesia Checklist: Patient identified, Emergency Drugs available, Suction available, Patient being monitored and Timeout performed Patient Re-evaluated:Patient Re-evaluated prior to induction Oxygen Delivery Method: Nasal cannula Preoxygenation: Pre-oxygenation with 100% oxygen Induction Type: IV induction

## 2024-09-24 NOTE — Transfer of Care (Signed)
 Immediate Anesthesia Transfer of Care Note  Patient: Nathan Ramos  Procedure(s) Performed: COLONOSCOPY POLYPECTOMY, INTESTINE  Patient Location: Endoscopy Unit  Anesthesia Type:General  Level of Consciousness: sedated and drowsy  Airway & Oxygen Therapy: Patient Spontanous Breathing  Post-op Assessment: Report given to RN and Post -op Vital signs reviewed and stable  Post vital signs: Reviewed and stable  Last Vitals:  Vitals Value Taken Time  BP 90/74 09/24/24 12:07  Temp    Pulse 74 09/24/24 12:07  Resp    SpO2 98 % 09/24/24 12:07    Last Pain:  Vitals:   09/24/24 1020  TempSrc: Temporal  PainSc: 5          Complications: No notable events documented.

## 2024-09-24 NOTE — Anesthesia Postprocedure Evaluation (Signed)
 Anesthesia Post Note  Patient: Nathan Ramos  Procedure(s) Performed: COLONOSCOPY POLYPECTOMY, INTESTINE  Patient location during evaluation: PACU Anesthesia Type: General Level of consciousness: awake Pain management: satisfactory to patient Vital Signs Assessment: post-procedure vital signs reviewed and stable Respiratory status: nonlabored ventilation Cardiovascular status: stable Anesthetic complications: no   No notable events documented.   Last Vitals:  Vitals:   09/24/24 1219 09/24/24 1227  BP: 110/74 129/84  Pulse: 77 78  Resp: 16 15  Temp:    SpO2: 97% 98%    Last Pain:  Vitals:   09/24/24 1227  TempSrc:   PainSc: 0-No pain                 VAN STAVEREN,Toren Tucholski

## 2024-09-24 NOTE — Interval H&P Note (Signed)
 History and Physical Interval Note:  09/24/2024 11:40 AM  Nathan Ramos  has presented today for surgery, with the diagnosis of Colon Cancer Screening.  The various methods of treatment have been discussed with the patient and family. After consideration of risks, benefits and other options for treatment, the patient has consented to  Procedure(s): COLONOSCOPY (N/A) as a surgical intervention.  The patient's history has been reviewed, patient examined, no change in status, stable for surgery.  I have reviewed the patient's chart and labs.  Questions were answered to the patient's satisfaction.     Howard, Bricelyn Freestone

## 2024-09-25 LAB — SURGICAL PATHOLOGY

## 2024-10-14 DIAGNOSIS — M79641 Pain in right hand: Secondary | ICD-10-CM | POA: Insufficient documentation

## 2024-10-14 DIAGNOSIS — Z79899 Other long term (current) drug therapy: Secondary | ICD-10-CM | POA: Insufficient documentation

## 2024-10-15 NOTE — Progress Notes (Unsigned)
 PROVIDER NOTE: Interpretation of information contained herein should be left to medically-trained personnel. Specific patient instructions are provided elsewhere under Patient Instructions section of medical record. This document was created in part using AI and STT-dictation technology, any transcriptional errors that may result from this process are unintentional.  Patient: Nathan Ramos  Service: E/M   PCP: [facility]  DOB: 02-13-1971  DOS: 10/16/2024  Provider: Emmy MARLA Blanch, NP  MRN: [id number]  Delivery: Face-to-face  Specialty: Interventional Pain Management  Type: Established Patient  Setting: Ambulatory outpatient facility  Specialty designation: 09  Referring Prov.: [facility]  Location: Outpatient office facility       History of present illness (HPI) Nathan Ramos, a 53 y.o. year old male, is here today because of his Chronic pain syndrome [G89.4]. Nathan Ramos primary complain today is No chief complaint on file.  Pertinent problems: Nathan Ramos has Left knee DJD; Chronic pain; Hypertension; DJD (degenerative joint disease) of knee; Primary localized osteoarthritis of right knee; Gouty arthritis; Controlled substance agreement signed; Wrist pain, right; Trigger ring finger of right hand; and Chronic pain syndrome on their pertinent problem list.  Pain Assessment: Severity of   is reported as a  /10. Location:    / . Onset:  . Quality:  . Timing:  . Modifying factor(s):  SABRA Vitals:  vitals were not taken for this visit.  BMI: Estimated body mass index is 31.71 kg/m as calculated from the following:   Height as of 09/24/24: 5' 10 (1.778 m).   Weight as of 09/24/24: 221 lb (100.2 kg).  Last encounter: 07/23/2024. Last procedure: Visit date not found.  Reason for encounter:  *** .   Discussed the use of AI scribe software for clinical note transcription with the patient, who gave verbal consent to proceed.  History of Present Illness            Pharmacotherapy Assessment   Analgesic: Oxycodone -acetaminophen  (Percocet) 10-325 mg tablet every 6 hours as needed for pain. MME=60 Monitoring: [place] PMP: PDMP reviewed during this encounter.       Pharmacotherapy: No side-effects or adverse reactions reported. Compliance: No problems identified. Effectiveness: Clinically acceptable.  No notes on file  UDS:  Summary  Date Value Ref Range Status  10/30/2023 FINAL  Final    Comment:    ==================================================================== [employer] (MW) ==================================================================== Test                             Result       Flag       Units  Drug Present and Declared for Prescription Verification   Oxycodone                       1110         EXPECTED   ng/mg creat   Oxymorphone                    4889         EXPECTED   ng/mg creat   Noroxycodone                   1582         EXPECTED   ng/mg creat   Noroxymorphone                 853          EXPECTED  ng/mg creat    Sources of oxycodone  are scheduled prescription medications.    Oxymorphone, noroxycodone, and noroxymorphone are expected    metabolites of oxycodone . Oxymorphone is also available as a    scheduled prescription medication.  ==================================================================== Test                      Result    Flag   Units      Ref Range   Creatinine              62               mg/dL      >=79 ==================================================================== Declared Medications:  The flagging and interpretation on this report are based on the  following declared medications.  Unexpected results may arise from  inaccuracies in the declared medications.   **Note: The testing scope of this panel includes these medications:   Oxycodone  (Percocet)   **Note: The testing scope of this panel does not include the  following reported medications:   Acetaminophen   (Percocet)  Amlodipine  (Norvasc )  Aspirin   Febuxostat   Irbesartan  (Avapro )  Metoprolol  (Lopressor )  Naloxone  (Narcan )  Rosuvastatin  (Crestor ) ==================================================================== For clinical consultation, please call [phone]. ====================================================================     No results found for: CBDTHCR No results found for: D8THCCBX No results found for: D9THCCBX  ROS  Constitutional: Denies any fever or chills Gastrointestinal: No reported hemesis, hematochezia, vomiting, or acute GI distress Musculoskeletal: Denies any acute onset joint swelling, redness, loss of ROM, or weakness Neurological: No reported episodes of acute onset apraxia, aphasia, dysarthria, agnosia, amnesia, paralysis, loss of coordination, or loss of consciousness  Medication Review  Febuxostat , amLODipine , aspirin , atorvastatin , irbesartan , metoprolol  tartrate, naloxone , oxyCODONE -acetaminophen , and rosuvastatin   History Review  Allergy: Nathan Ramos is allergic to nsaids. Drug: Nathan Ramos  reports no history of drug use. Alcohol:  reports no history of alcohol use. Tobacco:  reports that he has quit smoking. His smoking use included cigarettes. He has a 11 pack-year smoking history. He has never used smokeless tobacco. Social: Nathan Ramos  reports that he has quit smoking. His smoking use included cigarettes. He has a 11 pack-year smoking history. He has never used smokeless tobacco. He reports that he does not drink alcohol and does not use drugs. Medical:  has a past medical history of Anemia, Bone spur of other site, Chronic pain, Chronic tophaceous gout, CKD (chronic kidney disease), Controlled substance agreement signed, Gout, Gouty arthritis, History of YAG laser capsulotomy of lens of left eye, Hyperlipidemia, Hypertension, IFG (impaired fasting glucose), Left knee DJD, Medullary cystic disease of the kidney, Primary localized  osteoarthritis of right knee, and Smoker. Surgical: Nathan Ramos  has a past surgical history that includes Carpal tunnel release (Left); Umbilical hernia repair (11/13/2008); Cataract extraction w/ intraocular lens implant (Left, 11/13/2006); Cataract extraction w/ intraocular lens implant (Right, 11/13/2006); Hernia repair; Total knee arthroplasty (Left, 03/30/2014); Eye surgery; Total knee arthroplasty (Right, 10/26/2014); Joint replacement (Left, 03/13/2014); Coronary/Graft Acute MI Revascularization (N/A, 04/12/2023); LEFT HEART CATH AND CORONARY ANGIOGRAPHY (N/A, 04/12/2023); Cardiac catheterization; Colonoscopy (N/A, 09/24/2024); and Polypectomy (09/24/2024). Family: family history includes COPD in his maternal grandmother and mother; Kidney disease in his mother; Stroke (age of onset: 90) in his father; Thyroid  disease in his mother.  Laboratory Chemistry Profile   Renal Lab Results  Component Value Date   BUN 56 (H) 04/14/2023   CREATININE 3.77 (H) 04/14/2023   BCR 15 06/01/2020   GFRAA 27 (L) 06/01/2020  GFRNONAA 19 (L) 04/14/2023    Hepatic Lab Results  Component Value Date   AST 70 (H) 04/12/2023   ALT 20 04/12/2023   ALBUMIN 4.1 04/12/2023   ALKPHOS 122 04/12/2023    Electrolytes Lab Results  Component Value Date   NA 136 04/14/2023   K 5.3 (H) 04/14/2023   CL 109 04/14/2023   CALCIUM  9.0 04/14/2023    Bone No results found for: VD25OH, VD125OH2TOT, CI6874NY7, CI7874NY7, 25OHVITD1, 25OHVITD2, 25OHVITD3, TESTOFREE, TESTOSTERONE  Inflammation (CRP: Acute Phase) (ESR: Chronic Phase) No results found for: CRP, ESRSEDRATE, LATICACIDVEN       Note: Above Lab results reviewed.  Recent Imaging Review  CARDIAC CATHETERIZATION   RPAV lesion is 100% stenosed.   Mid LAD lesion is 50% stenosed.   2nd Diag lesion is 50% stenosed.   A stent was successfully placed.   Post intervention, there is a 0% residual stenosis.   There is mild left ventricular  systolic dysfunction.   LV end diastolic pressure is normal.   The left ventricular ejection fraction is 45-50% by visual estimate.   There is no mitral valve regurgitation.  Conclusion  STEMI presentation inferior wall Left ventricular function mildly depressed with inferior hypokinesis  ejection fraction around 45 to 50%  Coronaries Left main large minor irregularities LAD large 50% mid LAD bifurcation with diagonal 50% Circumflex large minor irregularities RCA large 100% occluded PL TIMI 0 flow IRA  Intervention Successful PCI and stent of the PL 3.0 x 15 mm frontier Onyx to 14 atm  lesion reduced from 100 down to 0% TIMI-3 flow restored from TIMI 0  No complications Patient tolerated procedure well Recommend aspirin  Brilinta  for 12 months Patient transferred to ICU postprocedure Note: Reviewed        Physical Exam  Vitals: There were no vitals taken for this visit. BMI: Estimated body mass index is 31.71 kg/m as calculated from the following:   Height as of 09/24/24: 5' 10 (1.778 m).   Weight as of 09/24/24: 221 lb (100.2 kg). Ideal: Patient weight not recorded General appearance: Well nourished, well developed, and well hydrated. In no apparent acute distress Mental status: Alert, oriented x 3 (person, place, & time)       Respiratory: No evidence of acute respiratory distress Eyes: PERLA   Assessment   Diagnosis Status  1. Chronic pain syndrome   2. History of bilateral knee replacement   3. Bilateral carpal tunnel syndrome   4. 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 Right hand pain   6. Medication management   7. Controlled substance agreement signed    Controlled Controlled Controlled   Updated Problems: No problems updated.  Plan of Care  Problem-specific:  Assessment and Plan            Nathan Ramos has a current medication list which includes the following long-term medication(s): amlodipine ,  atorvastatin , irbesartan , and metoprolol  tartrate.  Pharmacotherapy (Medications Ordered): No orders of the defined types were placed in this encounter.  Orders:  No orders of the defined types were placed in this encounter.    {There is no content from the last Plan section.}   No follow-ups on file.    Recent Visits Date Type Provider Dept  07/23/24 Office Visit Gedalia Mcmillon K, NP [facility]  Showing recent visits within past 90 days and meeting all other requirements Future Appointments Date Type Provider Dept  10/16/24 Appointment Anastasya Jewell K,  NP [facility]  Showing future appointments within next 90 days and meeting all other requirements  I discussed the assessment and treatment plan with the patient. The patient was provided an opportunity to ask questions and all were answered. The patient agreed with the plan and demonstrated an understanding of the instructions.  Patient advised to call back or seek an in-person evaluation if the symptoms or condition worsens.  Duration of encounter: *** minutes.  Total time on encounter, as per AMA guidelines included both the face-to-face and non-face-to-face time personally spent by the physician and/or other qualified health care professional(s) on the day of the encounter (includes time in activities that require the physician or other qualified health care professional and does not include time in activities normally performed by clinical staff). Physician's time may include the following activities when performed: Preparing to see the patient (e.g., pre-charting review of records, searching for previously ordered imaging, lab work, and nerve conduction tests) Review of prior analgesic pharmacotherapies. Reviewing PMP Interpreting ordered tests (e.g., lab work, imaging, nerve conduction tests) Performing post-procedure evaluations, including interpretation of diagnostic procedures Obtaining and/or reviewing  separately obtained history Performing a medically appropriate examination and/or evaluation Counseling and educating the patient/family/caregiver Ordering medications, tests, or procedures Referring and communicating with other health care professionals (when not separately reported) Documenting clinical information in the electronic or other health record Independently interpreting results (not separately reported) and communicating results to the patient/ family/caregiver Care coordination (not separately reported)  Note by: Shahrukh Pasch K Soleil Mas, NP (TTS and AI technology used. I apologize for any typographical errors that were not detected and corrected.) Date: 10/16/2024; Time: 3:35 PM

## 2024-10-16 ENCOUNTER — Encounter: Payer: Self-pay | Admitting: Nurse Practitioner

## 2024-10-16 ENCOUNTER — Ambulatory Visit: Attending: Nurse Practitioner | Admitting: Nurse Practitioner

## 2024-10-16 VITALS — BP 160/87 | HR 96 | Temp 98.6°F | Resp 18 | Ht 68.0 in | Wt 220.0 lb

## 2024-10-16 DIAGNOSIS — M79641 Pain in right hand: Secondary | ICD-10-CM | POA: Insufficient documentation

## 2024-10-16 DIAGNOSIS — G894 Chronic pain syndrome: Secondary | ICD-10-CM | POA: Insufficient documentation

## 2024-10-16 DIAGNOSIS — Z79899 Other long term (current) drug therapy: Secondary | ICD-10-CM | POA: Diagnosis not present

## 2024-10-16 DIAGNOSIS — G5603 Carpal tunnel syndrome, bilateral upper limbs: Secondary | ICD-10-CM | POA: Insufficient documentation

## 2024-10-16 DIAGNOSIS — M109 Gout, unspecified: Secondary | ICD-10-CM | POA: Insufficient documentation

## 2024-10-16 DIAGNOSIS — M1A39X1 Chronic gout due to renal impairment, multiple sites, with tophus (tophi): Secondary | ICD-10-CM | POA: Insufficient documentation

## 2024-10-16 DIAGNOSIS — Q615 Medullary cystic kidney: Secondary | ICD-10-CM | POA: Diagnosis not present

## 2024-10-16 DIAGNOSIS — Z96653 Presence of artificial knee joint, bilateral: Secondary | ICD-10-CM | POA: Insufficient documentation

## 2024-10-16 DIAGNOSIS — G5600 Carpal tunnel syndrome, unspecified upper limb: Secondary | ICD-10-CM | POA: Insufficient documentation

## 2024-10-16 DIAGNOSIS — N185 Chronic kidney disease, stage 5: Secondary | ICD-10-CM | POA: Insufficient documentation

## 2024-10-16 DIAGNOSIS — K429 Umbilical hernia without obstruction or gangrene: Secondary | ICD-10-CM | POA: Insufficient documentation

## 2024-10-16 MED ORDER — OXYCODONE-ACETAMINOPHEN 10-325 MG PO TABS: 1.0000 | ORAL_TABLET | Freq: Four times a day (QID) | ORAL | 0 refills | Status: AC | PRN

## 2024-10-16 NOTE — Progress Notes (Signed)
 Nursing Pain Medication Assessment:  Safety precautions to be maintained throughout the outpatient stay will include: orient to surroundings, keep bed in low position, maintain call bell within reach at all times, provide assistance with transfer out of bed and ambulation.  Medication Inspection Compliance: Pill count conducted under aseptic conditions, in front of the patient. Neither the pills nor the bottle was removed from the patient's sight at any time. Once count was completed pills were immediately returned to the patient in their original bottle.  Medication: Oxycodone /APAP Pill/Patch Count: 47 of 120 pills/patches remain Pill/Patch Appearance: Markings consistent with prescribed medication Bottle Appearance: Standard pharmacy container. Clearly labeled. Filled Date: 54 / 37 / 2025 Last Medication intake:  Today

## 2024-10-22 LAB — TOXASSURE SELECT 13 (MW), URINE

## 2025-01-12 ENCOUNTER — Encounter: Admitting: Nurse Practitioner
